# Patient Record
Sex: Female | Born: 1945 | Race: White | Hispanic: No | Smoking: Former smoker
Health system: Southern US, Community
[De-identification: ages and names within clinical notes are randomized; demographics above are authoritative.]

## PROBLEM LIST (undated history)

## (undated) DIAGNOSIS — J449 Chronic obstructive pulmonary disease, unspecified: Secondary | ICD-10-CM

## (undated) DIAGNOSIS — M542 Cervicalgia: Secondary | ICD-10-CM

## (undated) DIAGNOSIS — I10 Essential (primary) hypertension: Secondary | ICD-10-CM

## (undated) DIAGNOSIS — A048 Other specified bacterial intestinal infections: Secondary | ICD-10-CM

## (undated) DIAGNOSIS — D649 Anemia, unspecified: Secondary | ICD-10-CM

## (undated) DIAGNOSIS — Z87440 Personal history of urinary (tract) infections: Secondary | ICD-10-CM

## (undated) DIAGNOSIS — M479 Spondylosis, unspecified: Secondary | ICD-10-CM

## (undated) DIAGNOSIS — F419 Anxiety disorder, unspecified: Secondary | ICD-10-CM

## (undated) DIAGNOSIS — Z720 Tobacco use: Secondary | ICD-10-CM

## (undated) DIAGNOSIS — E871 Hypo-osmolality and hyponatremia: Secondary | ICD-10-CM

## (undated) DIAGNOSIS — R0602 Shortness of breath: Secondary | ICD-10-CM

## (undated) DIAGNOSIS — E7800 Pure hypercholesterolemia, unspecified: Secondary | ICD-10-CM

## (undated) DIAGNOSIS — F329 Major depressive disorder, single episode, unspecified: Secondary | ICD-10-CM

## (undated) DIAGNOSIS — M5136 Other intervertebral disc degeneration, lumbar region: Secondary | ICD-10-CM

## (undated) DIAGNOSIS — G629 Polyneuropathy, unspecified: Secondary | ICD-10-CM

## (undated) DIAGNOSIS — E079 Disorder of thyroid, unspecified: Secondary | ICD-10-CM

## (undated) DIAGNOSIS — C801 Malignant (primary) neoplasm, unspecified: Secondary | ICD-10-CM

## (undated) DIAGNOSIS — M51369 Other intervertebral disc degeneration, lumbar region without mention of lumbar back pain or lower extremity pain: Secondary | ICD-10-CM

## (undated) DIAGNOSIS — E119 Type 2 diabetes mellitus without complications: Secondary | ICD-10-CM

## (undated) DIAGNOSIS — J9601 Acute respiratory failure with hypoxia: Secondary | ICD-10-CM

## (undated) DIAGNOSIS — K5790 Diverticulosis of intestine, part unspecified, without perforation or abscess without bleeding: Secondary | ICD-10-CM

## (undated) DIAGNOSIS — K219 Gastro-esophageal reflux disease without esophagitis: Secondary | ICD-10-CM

## (undated) HISTORY — DX: Other intervertebral disc degeneration, lumbar region without mention of lumbar back pain or lower extremity pain: M51.369

## (undated) HISTORY — DX: Other specified bacterial intestinal infections: A04.8

## (undated) HISTORY — DX: Anxiety disorder, unspecified: F41.9

## (undated) HISTORY — PX: CATARACT EXTRACTION W/ INTRAOCULAR LENS  IMPLANT, BILATERAL: SHX1307

## (undated) HISTORY — PX: UVULECTOMY: SHX2631

## (undated) HISTORY — DX: Other intervertebral disc degeneration, lumbar region: M51.36

## (undated) HISTORY — DX: Pure hypercholesterolemia, unspecified: E78.00

## (undated) HISTORY — DX: Essential (primary) hypertension: I10

## (undated) HISTORY — DX: Chronic obstructive pulmonary disease, unspecified: J44.9

## (undated) HISTORY — PX: PARTIAL HYSTERECTOMY: SHX80

## (undated) HISTORY — PX: ABDOMINAL EXPLORATION SURGERY: SHX538

## (undated) HISTORY — DX: Diverticulosis of intestine, part unspecified, without perforation or abscess without bleeding: K57.90

## (undated) HISTORY — DX: Polyneuropathy, unspecified: G62.9

---

## 2003-10-06 ENCOUNTER — Emergency Department (HOSPITAL_COMMUNITY): Admission: EM | Admit: 2003-10-06 | Discharge: 2003-10-06 | Payer: Self-pay | Admitting: Emergency Medicine

## 2008-12-01 ENCOUNTER — Ambulatory Visit (HOSPITAL_COMMUNITY): Admission: RE | Admit: 2008-12-01 | Discharge: 2008-12-01 | Payer: Self-pay | Admitting: Family Medicine

## 2009-04-23 ENCOUNTER — Emergency Department (HOSPITAL_COMMUNITY): Admission: EM | Admit: 2009-04-23 | Discharge: 2009-04-23 | Payer: Self-pay | Admitting: Emergency Medicine

## 2009-10-16 HISTORY — PX: COLONOSCOPY: SHX174

## 2009-10-27 ENCOUNTER — Ambulatory Visit (HOSPITAL_COMMUNITY): Admission: RE | Admit: 2009-10-27 | Discharge: 2009-10-27 | Payer: Self-pay | Admitting: General Surgery

## 2010-03-11 ENCOUNTER — Ambulatory Visit: Payer: Self-pay | Admitting: Otolaryngology

## 2011-03-20 ENCOUNTER — Emergency Department (HOSPITAL_COMMUNITY)
Admission: EM | Admit: 2011-03-20 | Discharge: 2011-03-20 | Disposition: A | Discharge status: Home / Self Care | Payer: Medicare Other | Attending: Emergency Medicine | Admitting: Emergency Medicine

## 2011-03-20 ENCOUNTER — Emergency Department (HOSPITAL_COMMUNITY): Payer: Medicare Other

## 2011-03-20 DIAGNOSIS — J4489 Other specified chronic obstructive pulmonary disease: Secondary | ICD-10-CM | POA: Insufficient documentation

## 2011-03-20 DIAGNOSIS — G8929 Other chronic pain: Secondary | ICD-10-CM | POA: Insufficient documentation

## 2011-03-20 DIAGNOSIS — R079 Chest pain, unspecified: Secondary | ICD-10-CM | POA: Insufficient documentation

## 2011-03-20 DIAGNOSIS — Z79899 Other long term (current) drug therapy: Secondary | ICD-10-CM | POA: Insufficient documentation

## 2011-03-20 DIAGNOSIS — J449 Chronic obstructive pulmonary disease, unspecified: Secondary | ICD-10-CM | POA: Insufficient documentation

## 2011-03-20 DIAGNOSIS — M549 Dorsalgia, unspecified: Secondary | ICD-10-CM | POA: Insufficient documentation

## 2011-03-20 DIAGNOSIS — K2990 Gastroduodenitis, unspecified, without bleeding: Secondary | ICD-10-CM | POA: Insufficient documentation

## 2011-03-20 DIAGNOSIS — K2970 Gastritis, unspecified, without bleeding: Secondary | ICD-10-CM | POA: Insufficient documentation

## 2011-03-20 DIAGNOSIS — F411 Generalized anxiety disorder: Secondary | ICD-10-CM | POA: Insufficient documentation

## 2011-03-20 DIAGNOSIS — I10 Essential (primary) hypertension: Secondary | ICD-10-CM | POA: Insufficient documentation

## 2011-03-20 LAB — URINALYSIS, ROUTINE W REFLEX MICROSCOPIC
Bilirubin Urine: NEGATIVE
Glucose, UA: NEGATIVE mg/dL
Protein, ur: NEGATIVE mg/dL
Urobilinogen, UA: 0.2 mg/dL (ref 0.0–1.0)

## 2011-03-20 LAB — COMPREHENSIVE METABOLIC PANEL
BUN: 9 mg/dL (ref 6–23)
CO2: 26 mEq/L (ref 19–32)
Calcium: 11.2 mg/dL — ABNORMAL HIGH (ref 8.4–10.5)
Creatinine, Ser: 0.81 mg/dL (ref 0.50–1.10)
GFR calc Af Amer: >60 mL/min (ref >60)
GFR calc non Af Amer: >60 mL/min (ref >60)
Glucose, Bld: 98 mg/dL (ref 70–99)
Sodium: 133 mEq/L — ABNORMAL LOW (ref 135–145)
Total Protein: 7.4 g/dL (ref 6.0–8.3)

## 2011-03-20 LAB — CK TOTAL AND CKMB (NOT AT ARMC)
CK, MB: 2.1 ng/mL (ref 0.3–4.0)
Relative Index: INVALID (ref 0.0–2.5)
Total CK: 43 U/L (ref 7–177)

## 2011-03-20 LAB — DIFFERENTIAL
Basophils Relative: 1 % (ref 0–1)
Lymphs Abs: 2.9 10*3/uL (ref 0.7–4.0)
Monocytes Absolute: 0.8 10*3/uL (ref 0.1–1.0)
Monocytes Relative: 7 % (ref 3–12)
Neutro Abs: 7.5 10*3/uL (ref 1.7–7.7)

## 2011-03-20 LAB — LIPASE, BLOOD: Lipase: 30 U/L (ref 11–59)

## 2011-03-20 LAB — CBC
HCT: 35.9 % — ABNORMAL LOW (ref 36.0–46.0)
Hemoglobin: 12.2 g/dL (ref 12.0–15.0)
MCH: 29.8 pg (ref 26.0–34.0)
MCHC: 34 g/dL (ref 30.0–36.0)
MCV: 87.6 fL (ref 78.0–100.0)
RBC: 4.1 MIL/uL (ref 3.87–5.11)

## 2011-03-20 LAB — URINE MICROSCOPIC-ADD ON

## 2012-01-03 ENCOUNTER — Other Ambulatory Visit (HOSPITAL_COMMUNITY): Payer: Self-pay | Admitting: Family Medicine

## 2012-01-03 DIAGNOSIS — R0989 Other specified symptoms and signs involving the circulatory and respiratory systems: Secondary | ICD-10-CM

## 2012-01-03 DIAGNOSIS — Z139 Encounter for screening, unspecified: Secondary | ICD-10-CM

## 2012-01-03 DIAGNOSIS — J449 Chronic obstructive pulmonary disease, unspecified: Secondary | ICD-10-CM

## 2012-01-03 DIAGNOSIS — I10 Essential (primary) hypertension: Secondary | ICD-10-CM

## 2012-01-03 DIAGNOSIS — F17200 Nicotine dependence, unspecified, uncomplicated: Secondary | ICD-10-CM

## 2012-01-09 ENCOUNTER — Other Ambulatory Visit (HOSPITAL_COMMUNITY): Payer: Medicare Other

## 2012-01-09 ENCOUNTER — Ambulatory Visit (HOSPITAL_COMMUNITY)
Admission: RE | Admit: 2012-01-09 | Discharge: 2012-01-09 | Disposition: A | Discharge status: Home / Self Care | Payer: Medicare Other | Source: Ambulatory Visit | Attending: Family Medicine | Admitting: Family Medicine

## 2012-01-09 DIAGNOSIS — E785 Hyperlipidemia, unspecified: Secondary | ICD-10-CM | POA: Insufficient documentation

## 2012-01-09 DIAGNOSIS — I10 Essential (primary) hypertension: Secondary | ICD-10-CM | POA: Insufficient documentation

## 2012-01-09 DIAGNOSIS — J449 Chronic obstructive pulmonary disease, unspecified: Secondary | ICD-10-CM

## 2012-01-09 DIAGNOSIS — Z139 Encounter for screening, unspecified: Secondary | ICD-10-CM

## 2012-01-09 DIAGNOSIS — I739 Peripheral vascular disease, unspecified: Secondary | ICD-10-CM | POA: Insufficient documentation

## 2012-01-09 DIAGNOSIS — Z1231 Encounter for screening mammogram for malignant neoplasm of breast: Secondary | ICD-10-CM | POA: Insufficient documentation

## 2012-01-09 DIAGNOSIS — F17200 Nicotine dependence, unspecified, uncomplicated: Secondary | ICD-10-CM

## 2012-01-09 DIAGNOSIS — R0989 Other specified symptoms and signs involving the circulatory and respiratory systems: Secondary | ICD-10-CM

## 2012-04-04 ENCOUNTER — Other Ambulatory Visit (HOSPITAL_COMMUNITY): Payer: Self-pay | Admitting: Family Medicine

## 2012-04-04 DIAGNOSIS — I739 Peripheral vascular disease, unspecified: Secondary | ICD-10-CM

## 2012-04-04 DIAGNOSIS — M5430 Sciatica, unspecified side: Secondary | ICD-10-CM

## 2012-04-10 ENCOUNTER — Ambulatory Visit (HOSPITAL_COMMUNITY)
Admission: RE | Admit: 2012-04-10 | Discharge: 2012-04-10 | Disposition: A | Discharge status: Home / Self Care | Payer: Medicare Other | Source: Ambulatory Visit | Attending: Family Medicine | Admitting: Family Medicine

## 2012-04-10 DIAGNOSIS — M79609 Pain in unspecified limb: Secondary | ICD-10-CM | POA: Insufficient documentation

## 2012-04-10 DIAGNOSIS — M5430 Sciatica, unspecified side: Secondary | ICD-10-CM | POA: Insufficient documentation

## 2012-04-10 DIAGNOSIS — M5126 Other intervertebral disc displacement, lumbar region: Secondary | ICD-10-CM | POA: Insufficient documentation

## 2012-04-10 DIAGNOSIS — I739 Peripheral vascular disease, unspecified: Secondary | ICD-10-CM

## 2012-04-10 DIAGNOSIS — M5124 Other intervertebral disc displacement, thoracic region: Secondary | ICD-10-CM | POA: Insufficient documentation

## 2012-04-10 LAB — POCT I-STAT, CHEM 8
BUN: 4 mg/dL — ABNORMAL LOW (ref 6–23)
Calcium, Ion: 1.21 mmol/L (ref 1.13–1.30)
Chloride: 107 mEq/L (ref 96–112)
Creatinine, Ser: 0.9 mg/dL (ref 0.50–1.10)
Sodium: 137 mEq/L (ref 135–145)
TCO2: 21 mmol/L (ref 0–100)

## 2012-04-10 MED ORDER — IOHEXOL 350 MG/ML SOLN
150.0000 mL | Freq: Once | INTRAVENOUS | Status: AC | PRN
  Administered 2012-04-10: 150 mL via INTRAVENOUS

## 2012-04-10 MED ORDER — IOHEXOL 350 MG/ML SOLN: 150.0000 mL | Freq: Once | INTRAVENOUS | Status: AC | PRN

## 2012-04-10 NOTE — Progress Notes (Signed)
Blood sample obtained from left arm IV for Creatnine level.  

## 2012-04-26 ENCOUNTER — Emergency Department (HOSPITAL_COMMUNITY)
Admission: EM | Admit: 2012-04-26 | Discharge: 2012-04-26 | Disposition: A | Discharge status: Home / Self Care | Payer: Medicare Other | Attending: Emergency Medicine | Admitting: Emergency Medicine

## 2012-04-26 DIAGNOSIS — F419 Anxiety disorder, unspecified: Secondary | ICD-10-CM

## 2012-04-26 DIAGNOSIS — R110 Nausea: Secondary | ICD-10-CM | POA: Insufficient documentation

## 2012-04-26 DIAGNOSIS — Z789 Other specified health status: Secondary | ICD-10-CM

## 2012-04-26 DIAGNOSIS — F411 Generalized anxiety disorder: Secondary | ICD-10-CM | POA: Insufficient documentation

## 2012-04-26 MED ORDER — LORAZEPAM 1 MG PO TABS: 1.0000 mg | ORAL_TABLET | Freq: Three times a day (TID) | ORAL | Status: AC | PRN

## 2012-04-26 MED ORDER — LORAZEPAM 2 MG/ML IJ SOLN
1.0000 mg | Freq: Once | INTRAMUSCULAR | Status: AC
  Administered 2012-04-26: 1 mg via INTRAVENOUS
  Filled 2012-04-26: qty 1

## 2012-04-26 MED ORDER — ONDANSETRON 8 MG PO TBDP: 8.0000 mg | ORAL_TABLET | Freq: Three times a day (TID) | ORAL | Status: AC | PRN

## 2012-04-26 MED ORDER — ONDANSETRON HCL 4 MG/2ML IJ SOLN
4.0000 mg | Freq: Once | INTRAMUSCULAR | Status: AC
  Administered 2012-04-26: 4 mg via INTRAVENOUS
  Filled 2012-04-26: qty 2

## 2012-04-26 NOTE — ED Notes (Signed)
Pt states she thinks she is having a reaction to her medicine, has mild rash to arms, states also that she stopped taking her blood pressure med because she felt her blood pressure was normal now.

## 2012-04-26 NOTE — ED Provider Notes (Signed)
History  This chart was scribed for Toy Baker, MD by Ladona Ridgel Day. This patient was seen in room APA04/APA04 and the patient's care was started at 2030.   CSN: [id number]  Arrival date & time 04/26/12  2030   First MD Initiated Contact with Patient 04/26/12 2114      Chief Complaint  Patient presents with  . Dizziness  . Medication Reaction    The history is provided by the patient. No language interpreter was used.   Lindsey Combs is a 66 y.o. female brought in by ambulance, who presents to the Emergency Department complaining of possible reaction to her medicine this PM. She states she called EMS because she was having a skin rash and her head/eyes hurt. She states the rash has subsided since her arrival and she was being treated for diverticulitis. She had one episode of emesis last PM. She was seen by Dr. Metta Clines yesterday where she was given her prescription. She denies any itchiness, trouble swallowing, SOB, and fever. She is an everyday smoker . Pt notes that she is out of her ativan and has increased anxiety  No past medical history on file.  No past surgical history on file.  No family history on file.  History  Substance Use Topics  . Smoking status: Not on file  . Smokeless tobacco: Not on file  . Alcohol Use: Not on file    OB History    No data available      Review of Systems A complete 10 system review of systems was obtained and all systems are negative except as noted in the HPI and PMH.   Allergies  Codeine  Home Medications  No current outpatient prescriptions on file.  Triage Vitals: BP 180/73  Pulse 104  Temp 98.4 F (36.9 C) (Oral)  Resp 18  Ht 5' 5.5" (1.664 m)  Wt 130 lb (58.968 kg)  BMI 21.30 kg/m2  SpO2 98%  Physical Exam  Nursing note and vitals reviewed. Constitutional: She is oriented to person, place, and time. She appears well-developed and well-nourished. No distress.  HENT:  Head: Normocephalic and atraumatic.  Eyes:  EOM are normal.  Neck: Neck supple. No tracheal deviation present.  Cardiovascular: Normal rate, regular rhythm and normal heart sounds.   Pulmonary/Chest: Effort normal. No respiratory distress.  Musculoskeletal: Normal range of motion.  Neurological: She is alert and oriented to person, place, and time.  Skin: Skin is warm and dry. No petechiae and no rash noted. Rash is not urticarial.  Psychiatric:       Anxious.     ED Course  Procedures (including critical care time) DIAGNOSTIC STUDIES: Oxygen Saturation is 98% on room air, normal by my interpretation.    COORDINATION OF CARE: At 925 PM Discussed treatment plan with patient which includes nausea/anxiety medicine. Patient agrees.   Labs Reviewed - No data to display No results found.   No diagnosis found.    MDM  I personally performed the services described in this documentation, which was scribed in my presence. The recorded information has been reviewed and considered.  11:03 PM Pt given meds for anxiety and nausea and she feels better, no signs of allergic rxn, suspect medication intolerance from flagyl and cipro--pt will f/u her pcp         Toy Baker, MD 04/26/12 2304

## 2013-02-15 ENCOUNTER — Encounter: Payer: Self-pay | Admitting: General Surgery

## 2013-02-19 ENCOUNTER — Ambulatory Visit (INDEPENDENT_AMBULATORY_CARE_PROVIDER_SITE_OTHER): Payer: Medicare Other | Admitting: Gastroenterology

## 2013-02-19 ENCOUNTER — Encounter: Payer: Self-pay | Admitting: Gastroenterology

## 2013-02-19 VITALS — BP 119/71 | HR 96 | Temp 97.8°F | Ht 65.0 in | Wt 126.0 lb

## 2013-02-19 DIAGNOSIS — R1013 Epigastric pain: Secondary | ICD-10-CM

## 2013-02-19 DIAGNOSIS — R634 Abnormal weight loss: Secondary | ICD-10-CM | POA: Insufficient documentation

## 2013-02-19 DIAGNOSIS — K219 Gastro-esophageal reflux disease without esophagitis: Secondary | ICD-10-CM

## 2013-02-19 DIAGNOSIS — D509 Iron deficiency anemia, unspecified: Secondary | ICD-10-CM | POA: Insufficient documentation

## 2013-02-19 MED ORDER — PEG 3350-KCL-NA BICARB-NACL 420 G PO SOLR: 4000.0000 mL | ORAL | Status: DC

## 2013-02-19 NOTE — Assessment & Plan Note (Signed)
67 year old lady with recent new diagnosis of normocytic anemia with low iron saturation, low ferritin. No overt GI bleeding. Denies aspirin, NSAIDs. Chronic omeprazole. She has some vague epigastric discomfort, anorexia, 10 pound weight loss. She is a chronic smoker. History of multiple hyperplastic polyps in 2011 by Dr. Lovell Sheehan. Vague, mild left lower corner tenderness on exam but patient denies pain otherwise. Patient continues to have progressive fatigue. We'll recheck her CBC. Plan on EGD (+/-Small bowel bx) and colonoscopy for diagnostic reasons. I have discussed the risks, alternatives, benefits with regards to but not limited to the risk of reaction to medication, bleeding, infection, perforation and the patient is agreeable to proceed. Written consent to be obtained.

## 2013-02-19 NOTE — Progress Notes (Signed)
Primary Care Physician:  KNOWLTON,STEPHEN D, MD  Primary Gastroenterologist:  Michael Rourk, MD   Chief Complaint  Patient presents with  . Colonoscopy  . EGD  . Anemia    HPI:  Lindsey Combs is a 67 y.o. female here at request of Dr. Knowlton for further evaluation of IDA. Recently complained of progressive fatigue. Has chronic dyspnea on exertion related to COPD but slightly worse lately. Found to have new anemia with low ferritin of 12 as outlined below. Last colonoscopy by Dr. Jenkins in 2011, she had multiple hyperplastic polyps removed at the time. Upper abdominal discomfort chronic. Cannot wear tight clothes. Worse lately. No heartburn/indigestion. Dr.Teo ran light for sorethroat but everything "ok". On omeprazole for "awhile". No dysphagia, odynophagia. BM usually regular if eats Raisin Bran. No melena, brbpr. No N/V. Abnormal weight loss of ten pounds over the last one month. C/O anorexia. "Ulcer" at age 67 based on symptoms. No ASA, NSAIDS.   Current Outpatient Prescriptions  Medication Sig Dispense Refill  . acetaminophen (TYLENOL) 500 MG tablet Take 1,000 mg by mouth as needed.      . albuterol (PROAIR HFA) 108 (90 BASE) MCG/ACT inhaler Inhale 2 puffs into the lungs every 6 (six) hours as needed.      . atorvastatin (LIPITOR) 40 MG tablet Take 40 mg by mouth daily.       . cetirizine (ZYRTEC) 10 MG tablet Take 10 mg by mouth daily.      . clonazePAM (KLONOPIN) 1 MG tablet Take 1 mg by mouth at bedtime as needed.       . fluticasone (FLONASE) 50 MCG/ACT nasal spray Place 1 spray into the nose daily.       . omeprazole (PRILOSEC) 40 MG capsule Take 40 mg by mouth daily.        No current facility-administered medications for this visit.    Allergies as of 02/19/2013 - Review Complete 02/19/2013  Allergen Reaction Noted  . Codeine Itching 04/10/2012    Past Medical History  Diagnosis Date  . Anxiety   . Hypertension   . COPD (chronic obstructive pulmonary disease)   .  Hypercholesterolemia   . Diverticulosis   . Peripheral neuropathy   . DDD (degenerative disc disease), lumbar     Past Surgical History  Procedure Laterality Date  . Colonoscopy  10/16/09    Jenkins:3 polypsin the sigmoid colon/polyps in the descending colon and the rectum/scattered diverticulum. hyperplastic  . Partial hysterectomy      age 67  . Abdominal exploration surgery      age 67, large ovarian cyst    Family History  Problem Relation Age of Onset  . Stomach cancer Paternal Grandfather   . Leukemia Father   . Colon cancer Neg Hx     History   Social History  . Marital Status: Divorced    Spouse Name: N/A    Number of Children: 4  . Years of Education: N/A   Occupational History  . Not on file.   Social History Main Topics  . Smoking status: Current Every Day Smoker -- 1.00 packs/day    Types: Cigarettes  . Smokeless tobacco: Not on file  . Alcohol Use: No  . Drug Use: No  . Sexually Active: Not on file   Other Topics Concern  . Not on file   Social History Narrative  . No narrative on file      ROS:  General: Negative for fever, chills. C/O fatigue, weakness, abnormal weight   loss of 10 pounds in the last one month. C/O anorexia. Eyes: Negative for vision changes.  ENT: Negative for hoarseness, difficulty swallowing , nasal congestion. CV: Negative for chest pain, angina, palpitations, dyspnea on exertion, peripheral edema.  Respiratory: Negative for dyspnea at rest, cough, sputum, wheezing. +DOE. GI: See history of present illness. GU:  Negative for dysuria, hematuria, urinary incontinence, urinary frequency, nocturnal urination. Difficulty emptying bladder. MS: occasional joint pain. Chronic intermittent low back pain.  Derm: Negative for rash or itching.  Neuro: Negative for weakness, abnormal sensation, seizure, frequent headaches, memory loss, confusion.  Psych: Negative for anxiety, depression, suicidal ideation, hallucinations.  Endo: 10  pound unintentional weight loss.   Heme: Negative for bruising or bleeding. Allergy: Negative for rash or hives.    Physical Examination:  BP 119/71  Pulse 96  Temp(Src) 97.8 F (36.6 C) (Oral)  Ht 5' 5" (1.651 m)  Wt 126 lb (57.153 kg)  BMI 20.97 kg/m2   General: Well-nourished, well-developed in no acute distress.  Head: Normocephalic, atraumatic.   Eyes: Conjunctiva slightly pale, no icterus. Mouth: Oropharyngeal mucosa moist and pink , no lesions erythema or exudate. Neck: Supple without thyromegaly, masses, or lymphadenopathy.  Lungs: Scattered wheezes.   Heart: Regular rate and rhythm, no murmurs rubs or gallops.  Abdomen: Bowel sounds are normal, mild epigastric and left-sided tenderness to deep palpation, nondistended, no hepatosplenomegaly or masses, no abdominal bruits or    hernia , no rebound or guarding.   Rectal: Deferred Extremities: No lower extremity edema. No clubbing or deformities.  Neuro: Alert and oriented x 4 , grossly normal neurologically.  Skin: Warm and dry, no rash or jaundice.   Psych: Alert and cooperative, normal mood and affect.  Labs: Labs from 01/14/2013. Glucose 99, BUN 10, creatinine 0.74, total bilirubin 0.3, alkaline phosphatase 87, AST 12, ALT 9, albumin 4.5, calcium 10.2, white blood cell count 10,400, hemoglobin 11.1, hematocrit 34.6, MCV 90.2, platelets 305,000.  Labs from 01/21/2013. Hemoglobin 10.9, hematocrit 33.4, MCV 90.2, iron 46, and her saturations 12%, TIBC 378, vitamin B12 517, folate greater than 24. Ferritin 12. TSH 2.03  Imaging Studies: No results found.    

## 2013-02-19 NOTE — Patient Instructions (Signed)
1. Lab work as discussed. 2. Upper endoscopy and colonoscopy as scheduled. See separate instructions.

## 2013-02-19 NOTE — Progress Notes (Signed)
CC PCP 

## 2013-02-20 ENCOUNTER — Encounter (HOSPITAL_COMMUNITY): Payer: Self-pay | Admitting: Pharmacy Technician

## 2013-02-20 LAB — CBC WITH DIFFERENTIAL/PLATELET
Basophils Relative: 1 % (ref 0–1)
HCT: 34.5 % — ABNORMAL LOW (ref 36.0–46.0)
Hemoglobin: 11.1 g/dL — ABNORMAL LOW (ref 12.0–15.0)
Lymphs Abs: 3.2 10*3/uL (ref 0.7–4.0)
MCH: 28.2 pg (ref 26.0–34.0)
MCHC: 32.2 g/dL (ref 30.0–36.0)
Monocytes Absolute: 0.5 10*3/uL (ref 0.1–1.0)
Monocytes Relative: 4 % (ref 3–12)
Neutro Abs: 6.9 10*3/uL (ref 1.7–7.7)
RBC: 3.94 MIL/uL (ref 3.87–5.11)

## 2013-02-21 LAB — CBC
%SAT: 12
Ferritin: 12
Folate: 24
HCT: 35 %
HGB: 11.1 g/dL
MCV: 90.2 fL
MCV: 90.2 fL
Platelets: 305
TSH: 2.03 u[IU]/mL (ref 0.41–5.90)
Vitamin B12 Bind Capacity: 517
WBC: 10.4

## 2013-02-21 LAB — COMPREHENSIVE METABOLIC PANEL
Alkaline Phosphatase: 87 U/L
BUN: 10 mg/dL (ref 4–21)
Calcium: 10.2 mg/dL
Potassium: 4.7 mmol/L
Sodium: 143 mmol/L (ref 137–147)

## 2013-02-21 NOTE — Progress Notes (Signed)
Quick Note:  Her H/H is stable. Mild elevation in WBC, likely insignificant. EGD/TCS as planned. How is she doing? Any fever or abdominal pain, etc.? ______

## 2013-02-22 NOTE — Progress Notes (Signed)
She is doing a little bit better. No fever or abd pain. She is weak and glad her numbers are going up.

## 2013-02-22 NOTE — Progress Notes (Signed)
Pt aware of results 

## 2013-02-22 NOTE — Progress Notes (Signed)
Quick Note:  Please touch base with patient this morning about labs and she if she is having fever, abd pain, dysuria, how is she doing. ______

## 2013-03-01 ENCOUNTER — Ambulatory Visit (HOSPITAL_COMMUNITY)
Admission: RE | Admit: 2013-03-01 | Discharge: 2013-03-01 | Disposition: A | Discharge status: Home / Self Care | Payer: Medicare Other | Source: Ambulatory Visit | Attending: Internal Medicine | Admitting: Internal Medicine

## 2013-03-01 ENCOUNTER — Encounter (HOSPITAL_COMMUNITY): Payer: Self-pay | Admitting: *Deleted

## 2013-03-01 ENCOUNTER — Encounter (HOSPITAL_COMMUNITY)
Admission: RE | Disposition: A | Discharge status: Home / Self Care | Payer: Self-pay | Source: Ambulatory Visit | Attending: Internal Medicine

## 2013-03-01 DIAGNOSIS — D509 Iron deficiency anemia, unspecified: Secondary | ICD-10-CM

## 2013-03-01 DIAGNOSIS — D126 Benign neoplasm of colon, unspecified: Secondary | ICD-10-CM | POA: Insufficient documentation

## 2013-03-01 DIAGNOSIS — J449 Chronic obstructive pulmonary disease, unspecified: Secondary | ICD-10-CM | POA: Insufficient documentation

## 2013-03-01 DIAGNOSIS — K620 Anal polyp: Secondary | ICD-10-CM | POA: Insufficient documentation

## 2013-03-01 DIAGNOSIS — Z79899 Other long term (current) drug therapy: Secondary | ICD-10-CM | POA: Insufficient documentation

## 2013-03-01 DIAGNOSIS — D129 Benign neoplasm of anus and anal canal: Secondary | ICD-10-CM

## 2013-03-01 DIAGNOSIS — K2990 Gastroduodenitis, unspecified, without bleeding: Secondary | ICD-10-CM | POA: Insufficient documentation

## 2013-03-01 DIAGNOSIS — Z860100 Personal history of colon polyps, unspecified: Secondary | ICD-10-CM | POA: Insufficient documentation

## 2013-03-01 DIAGNOSIS — K2970 Gastritis, unspecified, without bleeding: Secondary | ICD-10-CM | POA: Insufficient documentation

## 2013-03-01 DIAGNOSIS — I10 Essential (primary) hypertension: Secondary | ICD-10-CM | POA: Insufficient documentation

## 2013-03-01 DIAGNOSIS — K5730 Diverticulosis of large intestine without perforation or abscess without bleeding: Secondary | ICD-10-CM

## 2013-03-01 DIAGNOSIS — F411 Generalized anxiety disorder: Secondary | ICD-10-CM | POA: Insufficient documentation

## 2013-03-01 DIAGNOSIS — G609 Hereditary and idiopathic neuropathy, unspecified: Secondary | ICD-10-CM | POA: Insufficient documentation

## 2013-03-01 DIAGNOSIS — M51379 Other intervertebral disc degeneration, lumbosacral region without mention of lumbar back pain or lower extremity pain: Secondary | ICD-10-CM | POA: Insufficient documentation

## 2013-03-01 DIAGNOSIS — R634 Abnormal weight loss: Secondary | ICD-10-CM

## 2013-03-01 DIAGNOSIS — R1013 Epigastric pain: Secondary | ICD-10-CM

## 2013-03-01 DIAGNOSIS — A048 Other specified bacterial intestinal infections: Secondary | ICD-10-CM | POA: Insufficient documentation

## 2013-03-01 DIAGNOSIS — J4489 Other specified chronic obstructive pulmonary disease: Secondary | ICD-10-CM | POA: Insufficient documentation

## 2013-03-01 DIAGNOSIS — Z8711 Personal history of peptic ulcer disease: Secondary | ICD-10-CM | POA: Insufficient documentation

## 2013-03-01 DIAGNOSIS — M5137 Other intervertebral disc degeneration, lumbosacral region: Secondary | ICD-10-CM | POA: Insufficient documentation

## 2013-03-01 DIAGNOSIS — E7800 Pure hypercholesterolemia, unspecified: Secondary | ICD-10-CM | POA: Insufficient documentation

## 2013-03-01 DIAGNOSIS — D128 Benign neoplasm of rectum: Secondary | ICD-10-CM

## 2013-03-01 DIAGNOSIS — K449 Diaphragmatic hernia without obstruction or gangrene: Secondary | ICD-10-CM

## 2013-03-01 DIAGNOSIS — K219 Gastro-esophageal reflux disease without esophagitis: Secondary | ICD-10-CM

## 2013-03-01 HISTORY — DX: Shortness of breath: R06.02

## 2013-03-01 HISTORY — DX: Spondylosis, unspecified: M47.9

## 2013-03-01 HISTORY — DX: Gastro-esophageal reflux disease without esophagitis: K21.9

## 2013-03-01 HISTORY — PX: BIOPSY: SHX5522

## 2013-03-01 HISTORY — PX: COLONOSCOPY WITH ESOPHAGOGASTRODUODENOSCOPY (EGD): SHX5779

## 2013-03-01 SURGERY — COLONOSCOPY WITH ESOPHAGOGASTRODUODENOSCOPY (EGD)
Anesthesia: Moderate Sedation

## 2013-03-01 MED ORDER — ONDANSETRON HCL 4 MG/2ML IJ SOLN
INTRAMUSCULAR | Status: AC
  Filled 2013-03-01: qty 2

## 2013-03-01 MED ORDER — MEPERIDINE HCL 100 MG/ML IJ SOLN
INTRAMUSCULAR | Status: DC | PRN
  Administered 2013-03-01 (×2): 25 mg via INTRAVENOUS
  Administered 2013-03-01: 50 mg via INTRAVENOUS

## 2013-03-01 MED ORDER — BUTAMBEN-TETRACAINE-BENZOCAINE 2-2-14 % EX AERO
INHALATION_SPRAY | CUTANEOUS | Status: DC | PRN
  Administered 2013-03-01: 2 via TOPICAL

## 2013-03-01 MED ORDER — MEPERIDINE HCL 100 MG/ML IJ SOLN
INTRAMUSCULAR | Status: AC
  Filled 2013-03-01: qty 2

## 2013-03-01 MED ORDER — MIDAZOLAM HCL 5 MG/5ML IJ SOLN
INTRAMUSCULAR | Status: DC | PRN
  Administered 2013-03-01: 2 mg via INTRAVENOUS
  Administered 2013-03-01: 1 mg via INTRAVENOUS
  Administered 2013-03-01: 2 mg via INTRAVENOUS
  Administered 2013-03-01: 1 mg via INTRAVENOUS

## 2013-03-01 MED ORDER — STERILE WATER FOR IRRIGATION IR SOLN
Status: DC | PRN
  Administered 2013-03-01: 09:00:00

## 2013-03-01 MED ORDER — SODIUM CHLORIDE 0.9 % IV SOLN
INTRAVENOUS | Status: DC
  Administered 2013-03-01: 08:00:00 via INTRAVENOUS

## 2013-03-01 MED ORDER — MIDAZOLAM HCL 5 MG/5ML IJ SOLN
INTRAMUSCULAR | Status: AC
  Filled 2013-03-01: qty 10

## 2013-03-01 MED ORDER — ONDANSETRON HCL 4 MG/2ML IJ SOLN
INTRAMUSCULAR | Status: DC | PRN
  Administered 2013-03-01: 4 mg via INTRAVENOUS

## 2013-03-01 NOTE — Op Note (Signed)
[facility] [address] [place], [address]   ENDOSCOPY PROCEDURE REPORT  PATIENT: Lindsey Combs, Lindsey Combs  MR#: [id number] BIRTHDATE: [birth year] , 66  yrs. old GENDER: Female ENDOSCOPIST: R.  Roetta Sessions, MD FACP FACG REFERRED BY:  Gareth Morgan, M.D. PROCEDURE DATE:  03/01/2013 PROCEDURE:     EGD with gastric and duodenal biopsy  INDICATIONS:     weight loss and iron deficiency anemia  INFORMED CONSENT:   The risks, benefits, limitations, alternatives and imponderables have been discussed.  The potential for biopsy, esophogeal dilation, etc. have also been reviewed.  Questions have been answered.  All parties agreeable.  Please see the history and physical in the medical record for more information.  MEDICATIONS:  Versed 5 mg IV and Demerol 75 mg IV. Zofran 4 mg IV. Cetacaine spray.  DESCRIPTION OF PROCEDURE:   The EG-[id number] ([id number])  endoscope was introduced through the mouth and advanced to the second portion of the duodenum without difficulty or limitations.  The mucosal surfaces were surveyed very carefully during advancement of the scope and upon withdrawal.  Retroflexion view of the proximal stomach and esophagogastric junction was performed.      FINDINGS:  Normal esophagus. Stomach empty. Normal. Gastric mucosa. Tiny hiatal hernia. Patent pylorus. Normal-appearing first, second and third portion of the duodenum.  THERAPEUTIC / DIAGNOSTIC MANEUVERS PERFORMED:  Biopsies the gastric and duodenal mucosa taken for histologic study   COMPLICATIONS:  None  IMPRESSION:  Tiny hiatal hernia; otherwise, normal examination. Status post biopsies as described above  RECOMMENDATIONS:  Followup on pathology. See colonoscopy report.    _______________________________ R. Roetta Sessions, MD FACP [facility] eSigned:  R. Roetta Sessions, MD FACP [facility] 03/01/2013 9:10 AM     CC:

## 2013-03-01 NOTE — Interval H&P Note (Signed)
History and Physical Interval Note:  03/01/2013 8:50 AM  Neal Dy  has presented today for surgery, with the diagnosis of IDA, ABNORMAL WEIGHT LOSS, EPIGASTRIC PAIN, CHRONIC GERD  The various methods of treatment have been discussed with the patient and family. After consideration of risks, benefits and other options for treatment, the patient has consented to  Procedure(s) with comments: COLONOSCOPY WITH ESOPHAGOGASTRODUODENOSCOPY (EGD) (N/A) - 8:30 BIOPSY (N/A) - POSSIBLE SMALL BOWEL BIOPSY as a surgical intervention .  The patient's history has been reviewed, patient examined, no change in status, stable for surgery.  I have reviewed the patient's chart and labs.  Questions were answered to the patient's satisfaction.     Molly Maduro Tashi Band  Colonoscopy and EGD today per plan. Patient states Hemoccult negative in Dr. Michelle Nasuti office recently.The risks, benefits, limitations, imponderables and alternatives regarding both EGD and colonoscopy have been reviewed with the patient. Questions have been answered. All parties agreeable.

## 2013-03-01 NOTE — Op Note (Signed)
[facility] [address] [place], [address]   COLONOSCOPY PROCEDURE REPORT  PATIENT: [name], [name]  MR#:         [id number] BIRTHDATE: [birth year] , 66  yrs. old GENDER: Female ENDOSCOPIST: R.  Roetta Sessions, MD FACP FACG REFERRED BY:  Gareth Morgan, M.D. PROCEDURE DATE:  03/01/2013 PROCEDURE:     Ileocolonoscopy with snare polypectomy and polyp ablation  INDICATIONS: iron deficiency anemia; weight loss.  INFORMED CONSENT:  The risks, benefits, alternatives and imponderables including but not limited to bleeding, perforation as well as the possibility of a missed lesion have been reviewed.  The potential for biopsy, lesion removal, etc. have also been discussed.  Questions have been answered.  All parties agreeable. Please see the history and physical in the medical record for more information.  MEDICATIONS: Versed 6 mg Demerol 100 mg IV doses. Zofran 4 mg IV  DESCRIPTION OF PROCEDURE:  After a digital rectal exam was performed, the EG-2990i ([id number]) and EC-3890Li ([id number]) colonoscope was advanced from the anus through the rectum and colon to the area of the cecum, ileocecal valve and appendiceal orifice. The cecum was deeply intubated.  These structures were well-seen and photographed for the record.  From the level of the cecum and ileocecal valve, the scope was slowly and cautiously withdrawn. The mucosal surfaces were carefully surveyed utilizing scope tip deflection to facilitate fold flattening as needed.  The scope was pulled down into the rectum where a thorough examination including retroflexion was performed.    FINDINGS:  Adequate preparation. Single external hemorrhoidal tag; multiple diminutive distal rectal polyps. Left-sided diverticula (1) 5 mm polyp at the rectosigmoid junction. The patient had an angry pedunculated 9 mm polyp on the proximal side of the ileocecal valve which was difficult to approach. The remainder of the  colonic mucosa appeared normal. The distal 10 cm of terminal ileal mucosa also appeared normal.  THERAPEUTIC / DIAGNOSTIC MANEUVERS PERFORMED:  The polyp at ileocecal valve was hot snare removed. The rectosigmoid polyp was hot snare removed. Multiple diminutive rectal and rectosigmoid polyps were ablated with the tip of a hot snare loop.  COMPLICATIONS: None  CECAL WITHDRAWAL TIME:  21 minutes  IMPRESSION:  Multiple colonic polyps - treated/removed as described above. Colonic diverticulosis.  RECOMMENDATIONS: Followup on pathology. Patient may need further evaluation of weight loss and iron deficiency anemia. Further recommendations to follow. See EGD report.   _______________________________ eSigned:  R. Roetta Sessions, MD FACP [facility] 03/01/2013 9:45 AM   CC:    PATIENT NAME:  Lindsey Combs, Lindsey Combs MR#: [id number]

## 2013-03-01 NOTE — H&P (View-Only) (Signed)
Primary Care Physician:  Milana Obey, MD  Primary Gastroenterologist:  Roetta Sessions, MD   Chief Complaint  Patient presents with  . Colonoscopy  . EGD  . Anemia    HPI:  Lindsey Combs is a 67 y.o. female here at request of Dr. Sudie Bailey for further evaluation of IDA. Recently complained of progressive fatigue. Has chronic dyspnea on exertion related to COPD but slightly worse lately. Found to have new anemia with low ferritin of 12 as outlined below. Last colonoscopy by Dr. Lovell Sheehan in 2011, she had multiple hyperplastic polyps removed at the time. Upper abdominal discomfort chronic. Cannot wear tight clothes. Worse lately. No heartburn/indigestion. Dr.Teo ran light for sorethroat but everything "ok". On omeprazole for "awhile". No dysphagia, odynophagia. BM usually regular if eats Raisin Bran. No melena, brbpr. No N/V. Abnormal weight loss of ten pounds over the last one month. C/O anorexia. "Ulcer" at age 57 based on symptoms. No ASA, NSAIDS.   Current Outpatient Prescriptions  Medication Sig Dispense Refill  . acetaminophen (TYLENOL) 500 MG tablet Take 1,000 mg by mouth as needed.      [name] albuterol (PROAIR HFA) 108 (90 BASE) MCG/ACT inhaler Inhale 2 puffs into the lungs every 6 (six) hours as needed.      [name] atorvastatin (LIPITOR) 40 MG tablet Take 40 mg by mouth daily.       . cetirizine (ZYRTEC) 10 MG tablet Take 10 mg by mouth daily.      . clonazePAM (KLONOPIN) 1 MG tablet Take 1 mg by mouth at bedtime as needed.       . fluticasone (FLONASE) 50 MCG/ACT nasal spray Place 1 spray into the nose daily.       [name] omeprazole (PRILOSEC) 40 MG capsule Take 40 mg by mouth daily.        No current facility-administered medications for this visit.    Allergies as of 02/19/2013 - Review Complete 02/19/2013  Allergen Reaction Noted  . Codeine Itching 04/10/2012    Past Medical History  Diagnosis Date  . Anxiety   . Hypertension   . COPD (chronic obstructive pulmonary disease)   .  Hypercholesterolemia   . Diverticulosis   . Peripheral neuropathy   . DDD (degenerative disc disease), lumbar     Past Surgical History  Procedure Laterality Date  . Colonoscopy  10/16/09    Jenkins:3 polypsin the sigmoid colon/polyps in the descending colon and the rectum/scattered diverticulum. hyperplastic  . Partial hysterectomy      age 39  . Abdominal exploration surgery      age 61, large ovarian cyst    Family History  Problem Relation Age of Onset  . Stomach cancer Paternal Grandfather   . Leukemia Father   . Colon cancer Neg Hx     History   Social History  . Marital Status: Divorced    Spouse Name: N/A    Number of Children: 4  . Years of Education: N/A   Occupational History  . Not on file.   Social History Main Topics  . Smoking status: Current Every Day Smoker -- 1.00 packs/day    Types: Cigarettes  . Smokeless tobacco: Not on file  . Alcohol Use: No  . Drug Use: No  . Sexually Active: Not on file   Other Topics Concern  . Not on file   Social History Narrative  . No narrative on file      ROS:  General: Negative for fever, chills. C/O fatigue, weakness, abnormal weight  loss of 10 pounds in the last one month. C/O anorexia. Eyes: Negative for vision changes.  ENT: Negative for hoarseness, difficulty swallowing , nasal congestion. CV: Negative for chest pain, angina, palpitations, dyspnea on exertion, peripheral edema.  Respiratory: Negative for dyspnea at rest, cough, sputum, wheezing. +DOE. GI: See history of present illness. GU:  Negative for dysuria, hematuria, urinary incontinence, urinary frequency, nocturnal urination. Difficulty emptying bladder. MS: occasional joint pain. Chronic intermittent low back pain.  Derm: Negative for rash or itching.  Neuro: Negative for weakness, abnormal sensation, seizure, frequent headaches, memory loss, confusion.  Psych: Negative for anxiety, depression, suicidal ideation, hallucinations.  Endo: 10  pound unintentional weight loss.   Heme: Negative for bruising or bleeding. Allergy: Negative for rash or hives.    Physical Examination:  BP 119/71  Pulse 96  Temp(Src) 97.8 F (36.6 C) (Oral)  Ht 5\' 5"  (1.651 m)  Wt 126 lb (57.153 kg)  BMI 20.97 kg/m2   General: Well-nourished, well-developed in no acute distress.  Head: Normocephalic, atraumatic.   Eyes: Conjunctiva slightly pale, no icterus. Mouth: Oropharyngeal mucosa moist and pink , no lesions erythema or exudate. Neck: Supple without thyromegaly, masses, or lymphadenopathy.  Lungs: Scattered wheezes.   Heart: Regular rate and rhythm, no murmurs rubs or gallops.  Abdomen: Bowel sounds are normal, mild epigastric and left-sided tenderness to deep palpation, nondistended, no hepatosplenomegaly or masses, no abdominal bruits or    hernia , no rebound or guarding.   Rectal: Deferred Extremities: No lower extremity edema. No clubbing or deformities.  Neuro: Alert and oriented x 4 , grossly normal neurologically.  Skin: Warm and dry, no rash or jaundice.   Psych: Alert and cooperative, normal mood and affect.  Labs: Labs from 01/14/2013. Glucose 99, BUN 10, creatinine 0.74, total bilirubin 0.3, alkaline phosphatase 87, AST 12, ALT 9, albumin 4.5, calcium 10.2, white blood cell count 10,400, hemoglobin 11.1, hematocrit 34.6, MCV 90.2, platelets 305,000.  Labs from 01/21/2013. Hemoglobin 10.9, hematocrit 33.4, MCV 90.2, iron 46, and her saturations 12%, TIBC 378, vitamin B12 517, folate greater than 24. Ferritin 12. TSH 2.03  Imaging Studies: No results found.

## 2013-03-04 ENCOUNTER — Encounter (HOSPITAL_COMMUNITY): Payer: Self-pay | Admitting: Internal Medicine

## 2013-03-10 ENCOUNTER — Encounter: Payer: Self-pay | Admitting: Internal Medicine

## 2013-03-12 ENCOUNTER — Telehealth: Payer: Self-pay

## 2013-03-12 NOTE — Telephone Encounter (Signed)
Rx called into [facility]

## 2013-03-12 NOTE — Telephone Encounter (Signed)
Letter from: Corbin Ade  Reason for Letter: Results Review Send letter to patient.  Send copy of letter with path to referring provider and PCP.  Pt needs prevpak or generic equivalent x 14 days; hold lipitor and omeprazole while on treatment - then resume.   Need cbc and office follow-up w LSL in about 6 weeks

## 2013-03-12 NOTE — Telephone Encounter (Signed)
Tried to call pt- LMOM 

## 2013-03-12 NOTE — Telephone Encounter (Signed)
Pt called back and is aware of results and Rx. She understands to hold her other medications.

## 2013-03-14 ENCOUNTER — Other Ambulatory Visit: Payer: Self-pay | Admitting: Internal Medicine

## 2013-03-14 ENCOUNTER — Other Ambulatory Visit: Payer: Self-pay

## 2013-03-14 DIAGNOSIS — D509 Iron deficiency anemia, unspecified: Secondary | ICD-10-CM

## 2013-03-14 NOTE — Telephone Encounter (Signed)
Lab order on file. Please schedule ov in 6 weeks. Please cc pcp

## 2013-03-18 ENCOUNTER — Telehealth: Payer: Self-pay

## 2013-03-18 NOTE — Telephone Encounter (Signed)
Pt said she is returning someone's call.

## 2013-03-18 NOTE — Telephone Encounter (Signed)
Routing to SS. She was scheduling pt appt for follow up.

## 2013-03-18 NOTE — Telephone Encounter (Signed)
Pt is aware of OV on 7/25 at 0930 with LSL

## 2013-03-18 NOTE — Telephone Encounter (Signed)
Pt is aware of OV on 7/25 at 0930

## 2013-04-16 ENCOUNTER — Encounter: Payer: Self-pay | Admitting: Internal Medicine

## 2013-04-19 ENCOUNTER — Encounter: Payer: Self-pay | Admitting: Gastroenterology

## 2013-04-19 ENCOUNTER — Ambulatory Visit (INDEPENDENT_AMBULATORY_CARE_PROVIDER_SITE_OTHER): Payer: Medicare Other | Admitting: Gastroenterology

## 2013-04-19 VITALS — BP 134/79 | HR 89 | Temp 97.4°F | Ht 68.0 in | Wt 123.6 lb

## 2013-04-19 DIAGNOSIS — G8929 Other chronic pain: Secondary | ICD-10-CM | POA: Insufficient documentation

## 2013-04-19 DIAGNOSIS — R634 Abnormal weight loss: Secondary | ICD-10-CM

## 2013-04-19 DIAGNOSIS — M549 Dorsalgia, unspecified: Secondary | ICD-10-CM

## 2013-04-19 DIAGNOSIS — D509 Iron deficiency anemia, unspecified: Secondary | ICD-10-CM

## 2013-04-19 MED ORDER — HYDROCODONE-ACETAMINOPHEN 5-325 MG PO TABS: 1.0000 | ORAL_TABLET | Freq: Four times a day (QID) | ORAL | Status: DC | PRN

## 2013-04-19 NOTE — Progress Notes (Signed)
CC PCP 

## 2013-04-19 NOTE — Patient Instructions (Addendum)
1. Please call Dr. Justus Memory office today to discuss painful urination. 2. I have given you a one time prescription for pain medication for your back until you're able to address further with Dr. Sudie Bailey. 3. Please have your blood work done on Monday. If nothing else is found that would contribute to your weight loss, we will plan on chest x-ray and CT scan of your abdomen as the next step. 4. Try to eat several small snacks throughout the day to increase calorie intake. 5. Return to the office in 6-8 weeks to see Dr. Jena Gauss.

## 2013-04-19 NOTE — Assessment & Plan Note (Addendum)
67 year old lady with history of abnormal unexplained weight loss, iron deficiency anemia. She was treated for H. pylori back in June. No longer having abdominal pain but has had no improvement in her appetite. Continues to lose weight, down 3 more pounds. She is due for followup CBC. We'll recheck thyroid status. If normal, would plan for chest x-ray 2 views, CT abdomen pelvis with contrast to further evaluate unexplained weight loss and a chronic smoker. Patient agrees with the plan. Before pursuing chest x-ray and CT she has asked that we wait for the blood work which I feel is reasonable. I have encouraged her to increase her oral intake.  She complains of dysuria. Given that we're not equipped to check urinalysis with an office and it is a weekend I have requested that she call her PCPs office which she agrees to do. I have given her a one time prescription for Vicodin for chronic back pain. She will f/u with PCP.  OV with Dr. Jena Gauss in 6-8 weeks.

## 2013-04-19 NOTE — Progress Notes (Signed)
Primary Care Physician: Milana Obey, MD  Primary Gastroenterologist:  Roetta Sessions, MD   Chief Complaint  Patient presents with  . Follow-up    HPI: Lindsey Combs is a 67 y.o. female here for followup of recent colonoscopy and EGD. She has a history of abnormal weight loss, iron deficiency anemia, epigastric pain, GERD. Procedures done on 03/01/2013 by Dr. Jena Gauss. She had a tiny hiatal hernia. Small bowel biopsy negative for celiac disease. Multiple colonic polyps treated and removed. Biopsy showed hyperplastic polyps. She also colonic diverticulosis. Due to her history of multiple polyps recently and and in the past she is slated for a followup colonoscopy in 5 years.  She is down another 3 pounds since her last office visit.  She reports she weighed 147 lb about 4-5 months ago. Appetite very bad. Eats about one meal per day. Gets little hungry late in the afternoon. Some nausea. No vomiting. No abdominal pain. C/O chronic back pain. Back pain constant. States has DDD. BM fine. [place] 3-4. No melena, brbpr. Completed H.Pylori treatment. Dad had thyroid disease and leukemia. C/O dysuria. Plans to see PCP today.  Current Outpatient Prescriptions  Medication Sig Dispense Refill  . acetaminophen (TYLENOL) 500 MG tablet Take 1,000 mg by mouth as needed for pain.       [name] albuterol (PROAIR HFA) 108 (90 BASE) MCG/ACT inhaler Inhale 2 puffs into the lungs every 6 (six) hours as needed.      [name] atorvastatin (LIPITOR) 40 MG tablet Take 40 mg by mouth daily.       . clonazePAM (KLONOPIN) 1 MG tablet Take 0.5-1 mg by mouth 3 (three) times daily as needed for anxiety.       . fluticasone (FLONASE) 50 MCG/ACT nasal spray Place 1 spray into the nose daily.       [name] omeprazole (PRILOSEC) 40 MG capsule Take 40 mg by mouth daily.       . cetirizine (ZYRTEC) 10 MG tablet Take 10 mg by mouth daily.       No current facility-administered medications for this visit.    Allergies as of 04/19/2013 -  Review Complete 04/19/2013  Allergen Reaction Noted  . Codeine Itching 04/10/2012    ROS:  General: Negative for fever, chills, fatigue, weakness. See hpi. ENT: Negative for hoarseness, difficulty swallowing , nasal congestion. CV: Negative for chest pain, angina, palpitations, dyspnea on exertion, peripheral edema.  Respiratory: Negative for dyspnea at rest, dyspnea on exertion, cough, sputum, wheezing.  GI: See history of present illness. GU:  Negative for  hematuria, urinary incontinence, urinary frequency, nocturnal urination. +dysuria-to see PCP today. Endo: see hpi.   Physical Examination:   BP 134/79  Pulse 89  Temp(Src) 97.4 F (36.3 C) (Oral)  Ht 5\' 8"  (1.727 m)  Wt 123 lb 9.6 oz (56.065 kg)  BMI 18.8 kg/m2  General: Well-nourished, well-developed in no acute distress.  Eyes: No icterus. Mouth: Oropharyngeal mucosa moist and pink , no lesions erythema or exudate. Lungs: Clear to auscultation bilaterally.  Heart: Regular rate and rhythm, no murmurs rubs or gallops.  Abdomen: Bowel sounds are normal, nontender, nondistended, no hepatosplenomegaly or masses, no abdominal bruits or hernia , no rebound or guarding.   Extremities: No lower extremity edema. No clubbing or deformities. Neuro: Alert and oriented x 4   Skin: Warm and dry, no jaundice.   Psych: Alert and cooperative, normal mood and affect.

## 2013-04-22 ENCOUNTER — Encounter: Payer: Self-pay | Admitting: Internal Medicine

## 2013-04-22 LAB — CBC WITH DIFFERENTIAL/PLATELET
Basophils Relative: 1 % (ref 0–1)
Eosinophils Relative: 2 % (ref 0–5)
HCT: 35.6 % — ABNORMAL LOW (ref 36.0–46.0)
Hemoglobin: 11.6 g/dL — ABNORMAL LOW (ref 12.0–15.0)
MCH: 28.5 pg (ref 26.0–34.0)
MCHC: 32.6 g/dL (ref 30.0–36.0)
MCV: 87.5 fL (ref 78.0–100.0)
Monocytes Absolute: 0.5 10*3/uL (ref 0.1–1.0)
Monocytes Relative: 7 % (ref 3–12)
Neutro Abs: 4.5 10*3/uL (ref 1.7–7.7)

## 2013-04-26 NOTE — Progress Notes (Signed)
Quick Note:  TSH normal. Per plan, CXR PA and lateral, with CT abd/pelvis due to weight loss. Hx of smoker.   Please arrange for next week.   ______

## 2013-04-29 ENCOUNTER — Other Ambulatory Visit: Payer: Self-pay | Admitting: Gastroenterology

## 2013-04-29 ENCOUNTER — Other Ambulatory Visit: Payer: Self-pay | Admitting: General Practice

## 2013-04-29 ENCOUNTER — Telehealth: Payer: Self-pay | Admitting: Internal Medicine

## 2013-04-29 DIAGNOSIS — Z139 Encounter for screening, unspecified: Secondary | ICD-10-CM

## 2013-04-29 NOTE — Progress Notes (Signed)
Quick Note:  Lab order faxed to [facility]. ______ 

## 2013-04-29 NOTE — Telephone Encounter (Signed)
Spoke with pt

## 2013-04-29 NOTE — Addendum Note (Signed)
Addended by: Jennings Books on: 04/29/2013 09:27 AM   Modules accepted: Orders

## 2013-04-29 NOTE — Progress Notes (Signed)
Quick Note:  Her H/H is up some but not quite normal. Await CXR/CT A/P. ______

## 2013-04-29 NOTE — Progress Notes (Signed)
Quick Note:  Pt is scheduled on 05/03/13 at 10:15 am she will arrive at 8:00am, because she would like to drink her contrast at [facility]. Pt will have labs done today to check her creatinine. ______

## 2013-04-29 NOTE — Telephone Encounter (Signed)
Pt called to see if her lab results were back yet and to check if her hbg was up. Please call her today before 5 per patient. [phone]

## 2013-04-29 NOTE — Progress Notes (Signed)
Quick Note:  Called [employer] needed to forward information for clinical review #[phone] ______

## 2013-04-29 NOTE — Progress Notes (Signed)
Quick Note:  CT Scan/cxr cancelled ______

## 2013-05-03 ENCOUNTER — Other Ambulatory Visit (HOSPITAL_COMMUNITY): Payer: Medicare Other

## 2013-05-17 ENCOUNTER — Ambulatory Visit: Payer: Medicare Other | Admitting: Internal Medicine

## 2013-06-14 ENCOUNTER — Ambulatory Visit: Payer: Medicare Other | Admitting: Internal Medicine

## 2013-06-25 ENCOUNTER — Telehealth: Payer: Self-pay | Admitting: Gastroenterology

## 2013-06-25 NOTE — Progress Notes (Signed)
Per Soledad Gerlach pt refused to have test done.  It's documented on the appt desk of patient refusal.

## 2013-06-25 NOTE — Telephone Encounter (Signed)
Why was CXR and CT A/P cancelled? Patient related? I don't see documentation as to why just that it was cancelled.

## 2013-06-25 NOTE — Telephone Encounter (Signed)
Pt refused. See addendum to ov note with LL

## 2013-07-10 ENCOUNTER — Other Ambulatory Visit (HOSPITAL_COMMUNITY): Payer: Self-pay | Admitting: Family Medicine

## 2013-07-10 ENCOUNTER — Ambulatory Visit (HOSPITAL_COMMUNITY)
Admission: RE | Admit: 2013-07-10 | Discharge: 2013-07-10 | Disposition: A | Discharge status: Home / Self Care | Payer: Medicare Other | Source: Ambulatory Visit | Attending: Family Medicine | Admitting: Family Medicine

## 2013-07-10 DIAGNOSIS — J449 Chronic obstructive pulmonary disease, unspecified: Secondary | ICD-10-CM

## 2013-07-10 DIAGNOSIS — R634 Abnormal weight loss: Secondary | ICD-10-CM | POA: Insufficient documentation

## 2013-07-10 DIAGNOSIS — F17200 Nicotine dependence, unspecified, uncomplicated: Secondary | ICD-10-CM

## 2013-07-10 DIAGNOSIS — Z87891 Personal history of nicotine dependence: Secondary | ICD-10-CM | POA: Insufficient documentation

## 2013-07-12 ENCOUNTER — Other Ambulatory Visit (HOSPITAL_COMMUNITY): Payer: Self-pay | Admitting: Family Medicine

## 2013-07-12 DIAGNOSIS — R634 Abnormal weight loss: Secondary | ICD-10-CM

## 2013-07-16 ENCOUNTER — Ambulatory Visit (HOSPITAL_COMMUNITY): Payer: Medicare Other

## 2013-07-19 ENCOUNTER — Ambulatory Visit: Payer: Medicare Other | Admitting: Internal Medicine

## 2013-07-19 ENCOUNTER — Telehealth: Payer: Self-pay | Admitting: Internal Medicine

## 2013-07-19 NOTE — Telephone Encounter (Signed)
Mailed letter °

## 2013-07-19 NOTE — Telephone Encounter (Signed)
Pt was a no show

## 2015-04-03 ENCOUNTER — Encounter (HOSPITAL_COMMUNITY): Payer: Self-pay | Admitting: Emergency Medicine

## 2015-04-03 ENCOUNTER — Inpatient Hospital Stay (HOSPITAL_COMMUNITY)
Admission: EM | Admit: 2015-04-03 | Discharge: 2015-04-06 | DRG: 190 | Disposition: A | Discharge status: Home / Self Care | Payer: Medicare Other | Attending: Family Medicine | Admitting: Family Medicine

## 2015-04-03 ENCOUNTER — Emergency Department (HOSPITAL_COMMUNITY): Payer: Medicare Other

## 2015-04-03 DIAGNOSIS — F17210 Nicotine dependence, cigarettes, uncomplicated: Secondary | ICD-10-CM | POA: Diagnosis present

## 2015-04-03 DIAGNOSIS — G8929 Other chronic pain: Secondary | ICD-10-CM | POA: Diagnosis present

## 2015-04-03 DIAGNOSIS — E780 Pure hypercholesterolemia: Secondary | ICD-10-CM | POA: Diagnosis present

## 2015-04-03 DIAGNOSIS — J449 Chronic obstructive pulmonary disease, unspecified: Secondary | ICD-10-CM | POA: Insufficient documentation

## 2015-04-03 DIAGNOSIS — M549 Dorsalgia, unspecified: Secondary | ICD-10-CM | POA: Diagnosis present

## 2015-04-03 DIAGNOSIS — Z800 Family history of malignant neoplasm of digestive organs: Secondary | ICD-10-CM

## 2015-04-03 DIAGNOSIS — Z806 Family history of leukemia: Secondary | ICD-10-CM | POA: Diagnosis not present

## 2015-04-03 DIAGNOSIS — F17200 Nicotine dependence, unspecified, uncomplicated: Secondary | ICD-10-CM | POA: Diagnosis present

## 2015-04-03 DIAGNOSIS — G629 Polyneuropathy, unspecified: Secondary | ICD-10-CM

## 2015-04-03 DIAGNOSIS — I10 Essential (primary) hypertension: Secondary | ICD-10-CM | POA: Diagnosis present

## 2015-04-03 DIAGNOSIS — D509 Iron deficiency anemia, unspecified: Secondary | ICD-10-CM | POA: Diagnosis present

## 2015-04-03 DIAGNOSIS — E871 Hypo-osmolality and hyponatremia: Secondary | ICD-10-CM | POA: Diagnosis present

## 2015-04-03 DIAGNOSIS — F419 Anxiety disorder, unspecified: Secondary | ICD-10-CM | POA: Diagnosis present

## 2015-04-03 DIAGNOSIS — K219 Gastro-esophageal reflux disease without esophagitis: Secondary | ICD-10-CM | POA: Diagnosis present

## 2015-04-03 DIAGNOSIS — R0789 Other chest pain: Secondary | ICD-10-CM | POA: Diagnosis present

## 2015-04-03 DIAGNOSIS — Z720 Tobacco use: Secondary | ICD-10-CM | POA: Diagnosis not present

## 2015-04-03 DIAGNOSIS — R0600 Dyspnea, unspecified: Secondary | ICD-10-CM

## 2015-04-03 DIAGNOSIS — R739 Hyperglycemia, unspecified: Secondary | ICD-10-CM | POA: Diagnosis present

## 2015-04-03 DIAGNOSIS — J441 Chronic obstructive pulmonary disease with (acute) exacerbation: Secondary | ICD-10-CM | POA: Diagnosis present

## 2015-04-03 DIAGNOSIS — R0602 Shortness of breath: Secondary | ICD-10-CM | POA: Diagnosis not present

## 2015-04-03 DIAGNOSIS — J9601 Acute respiratory failure with hypoxia: Secondary | ICD-10-CM | POA: Diagnosis present

## 2015-04-03 DIAGNOSIS — R079 Chest pain, unspecified: Secondary | ICD-10-CM | POA: Diagnosis present

## 2015-04-03 DIAGNOSIS — E860 Dehydration: Secondary | ICD-10-CM | POA: Diagnosis present

## 2015-04-03 HISTORY — DX: Tobacco use: Z72.0

## 2015-04-03 HISTORY — DX: Hypo-osmolality and hyponatremia: E87.1

## 2015-04-03 HISTORY — DX: Acute respiratory failure with hypoxia: J96.01

## 2015-04-03 LAB — BASIC METABOLIC PANEL
Anion gap: 10 (ref 5–15)
BUN: 7 mg/dL (ref 6–20)
CO2: 28 mmol/L (ref 22–32)
Calcium: 9.4 mg/dL (ref 8.9–10.3)
Chloride: 87 mmol/L — ABNORMAL LOW (ref 101–111)
Creatinine, Ser: 0.65 mg/dL (ref 0.44–1.00)
GFR calc Af Amer: >60 mL/min (ref >60)
GFR calc non Af Amer: >60 mL/min (ref >60)
Glucose, Bld: 141 mg/dL — ABNORMAL HIGH (ref 65–99)
Potassium: 4.1 mmol/L (ref 3.5–5.1)
Sodium: 125 mmol/L — ABNORMAL LOW (ref 135–145)

## 2015-04-03 LAB — CBC WITH DIFFERENTIAL/PLATELET
Basophils Absolute: 0 10*3/uL (ref 0.0–0.1)
Basophils Relative: 0 % (ref 0–1)
Eosinophils Absolute: 0 10*3/uL (ref 0.0–0.7)
Eosinophils Relative: 0 % (ref 0–5)
HCT: 41 % (ref 36.0–46.0)
Hemoglobin: 14.2 g/dL (ref 12.0–15.0)
Lymphocytes Relative: 11 % — ABNORMAL LOW (ref 12–46)
Lymphs Abs: 1 10*3/uL (ref 0.7–4.0)
MCH: 29.9 pg (ref 26.0–34.0)
MCHC: 34.6 g/dL (ref 30.0–36.0)
MCV: 86.3 fL (ref 78.0–100.0)
Monocytes Absolute: 0.3 10*3/uL (ref 0.1–1.0)
Monocytes Relative: 3 % (ref 3–12)
Neutro Abs: 8.2 10*3/uL — ABNORMAL HIGH (ref 1.7–7.7)
Neutrophils Relative %: 86 % — ABNORMAL HIGH (ref 43–77)
Platelets: 308 10*3/uL (ref 150–400)
RBC: 4.75 MIL/uL (ref 3.87–5.11)
RDW: 12.9 % (ref 11.5–15.5)
WBC: 9.5 10*3/uL (ref 4.0–10.5)

## 2015-04-03 LAB — TROPONIN I
Troponin I: <0.03 ng/mL (ref <0.031)
Troponin I: <0.03 ng/mL (ref <0.031)

## 2015-04-03 LAB — TSH: TSH: 0.681 u[IU]/mL (ref 0.350–4.500)

## 2015-04-03 LAB — GLUCOSE, CAPILLARY
GLUCOSE-CAPILLARY: 156 mg/dL — AB (ref 65–99)
Glucose-Capillary: 146 mg/dL — ABNORMAL HIGH (ref 65–99)

## 2015-04-03 MED ORDER — HYDROCODONE-ACETAMINOPHEN 5-325 MG PO TABS: 1.0000 | ORAL_TABLET | ORAL | Status: DC | PRN

## 2015-04-03 MED ORDER — ONDANSETRON HCL 4 MG/2ML IJ SOLN
4.0000 mg | Freq: Four times a day (QID) | INTRAMUSCULAR | Status: DC | PRN
  Administered 2015-04-04: 4 mg via INTRAVENOUS
  Filled 2015-04-03: qty 2

## 2015-04-03 MED ORDER — GUAIFENESIN-DM 100-10 MG/5ML PO SYRP
5.0000 mL | ORAL_SOLUTION | ORAL | Status: DC | PRN
Start: 2015-04-03 — End: 2015-04-06
  Administered 2015-04-05: 5 mL via ORAL
  Filled 2015-04-03: qty 5

## 2015-04-03 MED ORDER — ALBUTEROL (5 MG/ML) CONTINUOUS INHALATION SOLN
15.0000 mg/h | INHALATION_SOLUTION | RESPIRATORY_TRACT | Status: DC
  Administered 2015-04-03: 15 mg/h via RESPIRATORY_TRACT
  Filled 2015-04-03: qty 20

## 2015-04-03 MED ORDER — SODIUM CHLORIDE 0.9 % IV SOLN
INTRAVENOUS | Status: DC
  Administered 2015-04-03: 1000 mL via INTRAVENOUS
  Administered 2015-04-05 – 2015-04-06 (×3): via INTRAVENOUS

## 2015-04-03 MED ORDER — TRAZODONE HCL 50 MG PO TABS: 25.0000 mg | ORAL_TABLET | Freq: Every evening | ORAL | Status: DC | PRN

## 2015-04-03 MED ORDER — ONDANSETRON HCL 4 MG/2ML IJ SOLN
4.0000 mg | Freq: Once | INTRAMUSCULAR | Status: AC
  Administered 2015-04-03: 4 mg via INTRAVENOUS

## 2015-04-03 MED ORDER — ALUM & MAG HYDROXIDE-SIMETH 200-200-20 MG/5ML PO SUSP: 30.0000 mL | Freq: Four times a day (QID) | ORAL | Status: DC | PRN

## 2015-04-03 MED ORDER — ACETAMINOPHEN 325 MG PO TABS
650.0000 mg | ORAL_TABLET | Freq: Four times a day (QID) | ORAL | Status: DC | PRN
Start: 2015-04-03 — End: 2015-04-06
  Administered 2015-04-04 – 2015-04-05 (×2): 650 mg via ORAL
  Filled 2015-04-03 (×2): qty 2

## 2015-04-03 MED ORDER — LORAZEPAM 1 MG PO TABS
1.0000 mg | ORAL_TABLET | Freq: Three times a day (TID) | ORAL | Status: DC | PRN
  Administered 2015-04-03 – 2015-04-06 (×8): 1 mg via ORAL
  Filled 2015-04-03 (×9): qty 1

## 2015-04-03 MED ORDER — NICOTINE 21 MG/24HR TD PT24
21.0000 mg | MEDICATED_PATCH | Freq: Every day | TRANSDERMAL | Status: DC
  Administered 2015-04-03 – 2015-04-06 (×4): 21 mg via TRANSDERMAL
  Filled 2015-04-03 (×4): qty 1

## 2015-04-03 MED ORDER — PANTOPRAZOLE SODIUM 40 MG PO TBEC
40.0000 mg | DELAYED_RELEASE_TABLET | Freq: Every day | ORAL | Status: DC
  Administered 2015-04-03 – 2015-04-06 (×4): 40 mg via ORAL
  Filled 2015-04-03 (×4): qty 1

## 2015-04-03 MED ORDER — MAGNESIUM CITRATE PO SOLN: 1.0000 | Freq: Once | ORAL | Status: AC | PRN

## 2015-04-03 MED ORDER — ONDANSETRON HCL 4 MG PO TABS: 4.0000 mg | ORAL_TABLET | Freq: Four times a day (QID) | ORAL | Status: DC | PRN

## 2015-04-03 MED ORDER — ATORVASTATIN CALCIUM 20 MG PO TABS
20.0000 mg | ORAL_TABLET | Freq: Every day | ORAL | Status: DC
  Administered 2015-04-03 – 2015-04-06 (×4): 20 mg via ORAL
  Filled 2015-04-03 (×4): qty 1

## 2015-04-03 MED ORDER — LEVOFLOXACIN 750 MG PO TABS
750.0000 mg | ORAL_TABLET | Freq: Every day | ORAL | Status: DC
  Administered 2015-04-03 – 2015-04-06 (×4): 750 mg via ORAL
  Filled 2015-04-03 (×4): qty 1

## 2015-04-03 MED ORDER — METHYLPREDNISOLONE SODIUM SUCC 125 MG IJ SOLR
80.0000 mg | Freq: Four times a day (QID) | INTRAMUSCULAR | Status: DC
  Administered 2015-04-03 – 2015-04-04 (×4): 80 mg via INTRAVENOUS
  Filled 2015-04-03 (×4): qty 2

## 2015-04-03 MED ORDER — ONDANSETRON HCL 4 MG/2ML IJ SOLN
4.0000 mg | Freq: Once | INTRAMUSCULAR | Status: DC
  Filled 2015-04-03: qty 2

## 2015-04-03 MED ORDER — IPRATROPIUM BROMIDE 0.02 % IN SOLN
0.5000 mg | Freq: Once | RESPIRATORY_TRACT | Status: AC
  Administered 2015-04-03: 0.5 mg via RESPIRATORY_TRACT
  Filled 2015-04-03: qty 2.5

## 2015-04-03 MED ORDER — DOCUSATE SODIUM 100 MG PO CAPS
100.0000 mg | ORAL_CAPSULE | Freq: Two times a day (BID) | ORAL | Status: DC
  Administered 2015-04-03 – 2015-04-06 (×5): 100 mg via ORAL
  Filled 2015-04-03 (×6): qty 1

## 2015-04-03 MED ORDER — MORPHINE SULFATE 2 MG/ML IJ SOLN: 1.0000 mg | INTRAMUSCULAR | Status: DC | PRN

## 2015-04-03 MED ORDER — ALBUTEROL SULFATE (2.5 MG/3ML) 0.083% IN NEBU: 2.5000 mg | INHALATION_SOLUTION | RESPIRATORY_TRACT | Status: DC | PRN

## 2015-04-03 MED ORDER — INSULIN ASPART 100 UNIT/ML ~~LOC~~ SOLN: 0.0000 [IU] | Freq: Every day | SUBCUTANEOUS | Status: DC

## 2015-04-03 MED ORDER — INSULIN ASPART 100 UNIT/ML ~~LOC~~ SOLN
0.0000 [IU] | Freq: Three times a day (TID) | SUBCUTANEOUS | Status: DC
  Administered 2015-04-03 – 2015-04-05 (×4): 2 [IU] via SUBCUTANEOUS

## 2015-04-03 MED ORDER — ACETAMINOPHEN 650 MG RE SUPP: 650.0000 mg | Freq: Four times a day (QID) | RECTAL | Status: DC | PRN

## 2015-04-03 MED ORDER — ENOXAPARIN SODIUM 40 MG/0.4ML ~~LOC~~ SOLN
40.0000 mg | SUBCUTANEOUS | Status: DC
  Administered 2015-04-04 – 2015-04-05 (×2): 40 mg via SUBCUTANEOUS
  Filled 2015-04-03 (×3): qty 0.4

## 2015-04-03 MED ORDER — IPRATROPIUM-ALBUTEROL 0.5-2.5 (3) MG/3ML IN SOLN
3.0000 mL | RESPIRATORY_TRACT | Status: DC
  Administered 2015-04-03 (×2): 3 mL via RESPIRATORY_TRACT
  Filled 2015-04-03 (×2): qty 3

## 2015-04-03 MED ORDER — METHYLPREDNISOLONE SODIUM SUCC 125 MG IJ SOLR
80.0000 mg | Freq: Once | INTRAMUSCULAR | Status: AC
  Administered 2015-04-03: 80 mg via INTRAVENOUS
  Filled 2015-04-03: qty 2

## 2015-04-03 MED ORDER — SORBITOL 70 % SOLN: 30.0000 mL | Freq: Every day | Status: DC | PRN

## 2015-04-03 MED ORDER — MORPHINE SULFATE 4 MG/ML IJ SOLN
4.0000 mg | Freq: Once | INTRAMUSCULAR | Status: AC
  Administered 2015-04-03: 4 mg via INTRAVENOUS
  Filled 2015-04-03: qty 1

## 2015-04-03 NOTE — ED Notes (Signed)
Pt states that MD came and spoke with her.  States that she needs medication for nausea.  Awaiting order.

## 2015-04-03 NOTE — ED Provider Notes (Signed)
CSN: [id number]     Arrival date & time 04/03/15  2542 History  This chart was scribed for Virgel Manifold, MD by Eustaquio Maize, ED Scribe. This patient was seen in room APA19/APA19 and the patient's care was started at 10:35 AM.    Chief Complaint  Patient presents with  . Shortness of Breath   The history is provided by the patient. No language interpreter was used.     HPI Comments: Lindsey Combs is a 69 y.o. female with hx COPD and HTN who presents to the Emergency Department complaining of increasing shortness of breath x 2 weeks. She also notes dyspnea on exertion which is not normal for her. Pt also complains of headaches and intermittent sternal chest pain x 2 weeks. The pain is exacerbated with strenuous activity.  She also complains of fever. Denies chills, leg swelling, or any other symptoms. Pt has seen her PCP for her increasing shortness of breath 2 days ago and was put on Prednisone and Z pack at that time. Pt is not currently on anti-coagulants.    Past Medical History  Diagnosis Date  . Anxiety   . Hypertension   . COPD (chronic obstructive pulmonary disease)   . Hypercholesterolemia   . Diverticulosis   . Peripheral neuropathy   . DDD (degenerative disc disease), lumbar   . Shortness of breath     with exertion  . Degenerative joint disease of spine   . GERD (gastroesophageal reflux disease)   . H. pylori infection     treated 02/2013.   Past Surgical History  Procedure Laterality Date  . Colonoscopy  10/16/09    Jenkins:3 polypsin the sigmoid colon/polyps in the descending colon and the rectum/scattered diverticulum. hyperplastic  . Partial hysterectomy      age 75  . Abdominal exploration surgery      age 16, large ovarian cyst  . Colonoscopy with esophagogastroduodenoscopy (egd) N/A 03/01/2013    HCW:CBJSEGBT colonic polyps - treated/removed as described above. Colonic diverticulosis. Hyperplastic. Next TCS 02/2018.  [name] Esophageal biopsy N/A 03/01/2013   DVV:OHYW hiatal hernia; otherwise, normal examination/ Status post biopsies as described above. SB bx negative for Celiac. +h/pylori   Family History  Problem Relation Age of Onset  . Stomach cancer Paternal Grandfather   . Leukemia Father   . Colon cancer Neg Hx   . Thyroid disease Father    History  Substance Use Topics  . Smoking status: Current Every Day Smoker -- 1.50 packs/day for 50 years    Types: Cigarettes  . Smokeless tobacco: Never Used  . Alcohol Use: No     Comment: glass of wine occasionally   OB History    Gravida Para Term Preterm AB TAB SAB Ectopic Multiple Living   '5 4 4  1  1        '$ Review of Systems  Constitutional: Positive for fever. Negative for chills.  Respiratory: Positive for shortness of breath.   Cardiovascular: Positive for chest pain. Negative for leg swelling.  Neurological: Positive for headaches.  All other systems reviewed and are negative.     Allergies  Chantix and Codeine  Home Medications   Prior to Admission medications   Medication Sig Start Date End Date Taking? Authorizing Provider  acetaminophen (TYLENOL) 500 MG tablet Take 1,000 mg by mouth as needed for pain.     Historical Provider, MD  albuterol (PROAIR HFA) 108 (90 BASE) MCG/ACT inhaler Inhale 2 puffs into the lungs every 6 (six) hours  as needed.    Historical Provider, MD  atorvastatin (LIPITOR) 40 MG tablet Take 40 mg by mouth daily.  01/29/13   Historical Provider, MD  cetirizine (ZYRTEC) 10 MG tablet Take 10 mg by mouth daily.    Historical Provider, MD  clonazePAM (KLONOPIN) 1 MG tablet Take 0.5-1 mg by mouth 3 (three) times daily as needed for anxiety.  02/12/13   Historical Provider, MD  fluticasone (FLONASE) 50 MCG/ACT nasal spray Place 1 spray into the nose daily.  02/12/13   Historical Provider, MD  HYDROcodone-acetaminophen (NORCO/VICODIN) 5-325 MG per tablet Take 1 tablet by mouth every 6 (six) hours as needed for pain. 04/19/13   Mahala Menghini, PA-C  omeprazole  (PRILOSEC) 40 MG capsule Take 40 mg by mouth daily.  01/29/13   Historical Provider, MD   Triage Vitals: BP 225/92 mmHg  Pulse 108  Temp(Src) 97.9 F (36.6 C) (Oral)  Resp 20  Ht 5' 2.5" (1.588 m)  Wt 116 lb (52.617 kg)  BMI 20.87 kg/m2  SpO2 96%   Physical Exam  Constitutional: She is oriented to person, place, and time. She appears well-developed and well-nourished. No distress.  HENT:  Head: Normocephalic and atraumatic.  Eyes: Conjunctivae and EOM are normal.  Neck: Neck supple. No tracheal deviation present.  Cardiovascular: Normal rate.   Pulmonary/Chest: Effort normal. No respiratory distress. She has wheezes.  Extremly wheezy bilaterally.  Prolonged expiratory phase.   Musculoskeletal: Normal range of motion. She exhibits edema.  No lower extremity edema.  No calf tenderness.   Neurological: She is alert and oriented to person, place, and time.  Skin: Skin is warm and dry.  Psychiatric: She has a normal mood and affect. Her behavior is normal.  Nursing note and vitals reviewed.   ED Course  Procedures (including critical care time)  DIAGNOSTIC STUDIES: Oxygen Saturation is 96% on RA, adequate by my interpretation.    COORDINATION OF CARE: 10:41 AM-Discussed treatment plan which includes CXR, breathing treatment with pt at bedside and pt agreed to plan.   Labs Review Labs Reviewed  CBC WITH DIFFERENTIAL/PLATELET - Abnormal; Notable for the following:    Neutrophils Relative % 86 (*)    Neutro Abs 8.2 (*)    Lymphocytes Relative 11 (*)    All other components within normal limits  BASIC METABOLIC PANEL - Abnormal; Notable for the following:    Sodium 125 (*)    Chloride 87 (*)    Glucose, Bld 141 (*)    All other components within normal limits  HEMOGLOBIN A1C - Abnormal; Notable for the following:    Hgb A1c MFr Bld 5.9 (*)    All other components within normal limits  BASIC METABOLIC PANEL - Abnormal; Notable for the following:    Sodium 124 (*)     Chloride 86 (*)    Glucose, Bld 134 (*)    All other components within normal limits  GLUCOSE, CAPILLARY - Abnormal; Notable for the following:    Glucose-Capillary 146 (*)    All other components within normal limits  GLUCOSE, CAPILLARY - Abnormal; Notable for the following:    Glucose-Capillary 156 (*)    All other components within normal limits  GLUCOSE, CAPILLARY - Abnormal; Notable for the following:    Glucose-Capillary 136 (*)    All other components within normal limits  GLUCOSE, CAPILLARY - Abnormal; Notable for the following:    Glucose-Capillary 104 (*)    All other components within normal limits  URINALYSIS, ROUTINE W  REFLEX MICROSCOPIC (NOT AT [facility]) - Abnormal; Notable for the following:    Hgb urine dipstick MODERATE (*)    Protein, ur TRACE (*)    All other components within normal limits  BASIC METABOLIC PANEL - Abnormal; Notable for the following:    Sodium 128 (*)    Chloride 88 (*)    Glucose, Bld 123 (*)    All other components within normal limits  GLUCOSE, CAPILLARY - Abnormal; Notable for the following:    Glucose-Capillary 131 (*)    All other components within normal limits  GLUCOSE, CAPILLARY - Abnormal; Notable for the following:    Glucose-Capillary 151 (*)    All other components within normal limits  GLUCOSE, CAPILLARY - Abnormal; Notable for the following:    Glucose-Capillary 121 (*)    All other components within normal limits  GLUCOSE, CAPILLARY - Abnormal; Notable for the following:    Glucose-Capillary 108 (*)    All other components within normal limits  GLUCOSE, CAPILLARY - Abnormal; Notable for the following:    Glucose-Capillary 144 (*)    All other components within normal limits  BASIC METABOLIC PANEL - Abnormal; Notable for the following:    Sodium 134 (*)    Potassium 3.3 (*)    Chloride 94 (*)    CO2 33 (*)    Glucose, Bld 109 (*)    Calcium 8.7 (*)    All other components within normal limits  GLUCOSE, CAPILLARY -  Abnormal; Notable for the following:    Glucose-Capillary 124 (*)    All other components within normal limits  GLUCOSE, CAPILLARY - Abnormal; Notable for the following:    Glucose-Capillary 106 (*)    All other components within normal limits  GLUCOSE, CAPILLARY - Abnormal; Notable for the following:    Glucose-Capillary 115 (*)    All other components within normal limits  TROPONIN I  TSH  TROPONIN I  TROPONIN I  URINE MICROSCOPIC-ADD ON    Imaging Review No results found.   Dg Chest 2 View  04/03/2015   CLINICAL DATA:  Two week history of wheezing and congestion  EXAM: CHEST  2 VIEW  COMPARISON:  July 10, 2013  FINDINGS: Lungs remain somewhat hyperexpanded. There is no edema or consolidation. Heart size and pulmonary vascularity within normal limits. No adenopathy. No bone lesions.  IMPRESSION: Lungs hyperexpanded.  No edema or consolidation.   Electronically Signed   By: Lowella Grip III M.D.   On: 04/03/2015 11:38     EKG Interpretation   Date/Time:  Friday April 03 2015 10:22:03 EDT Ventricular Rate:  104 PR Interval:  160 QRS Duration: 92 QT Interval:  358 QTC Calculation: 471 R Axis:   79 Text Interpretation:  Sinus tachycardia Biatrial enlargement increased  rate, otherwise similar to prevous EKG 02/2011 Confirmed by Wilson Singer  MD,  [place] ([id number]) on 04/03/2015 10:26:54 AM      MDM   Final diagnoses:  Dyspnea  Acute Respiratory failure.     I personally preformed the services scribed in my presence. The recorded information has been reviewed is accurate. Virgel Manifold, MD.      Virgel Manifold, MD 04/10/15 [phone]

## 2015-04-03 NOTE — H&P (Signed)
[facility] History and Physical  Lindsey Combs TKZ:[id number] DOB: 1945-12-18 DOA: 04/03/2015  Referring physician: Wilson Singer PCP: Robert Bellow, MD   Chief Complaint: shortness of breath  HPI: Lindsey Combs is a pleasant 69 y.o. female with a past medical history that includes COPD not on home oxygen were home nebulizers, tobacco use, hypertension, chronic pain related to degenerative disc disease, GERD, peripheral neuropathy presents to the emergency department with the chief complaint of worsening shortness of breath. Initial evaluation in the emergency department reveals acute respiratory failure with hypoxia on ambulation and dehydration. Patient reports that 2 weeks ago she was outdoors on awake for many hours and afterwards developed worsening shortness of breath. She used her rescue inhaler which she reports she typically uses 1 or 2 times a day without relief. In the course of the next few days she developed productive cough with white thick sputum, chills, nausea with no vomiting and intermittent diarrhea.  In addition she developed bilateral anterior chest pain worse with coughing. She denies lower extremity edema or orthopnea, headache visual disturbances syncope or near-syncope. During this time she went to her primary care provider she was provided with steoid injection as well as anabiotic injection. There was little improvement. 2 days ago she went back to her primary care provider is provided with more steroids and oral anabiotic's as well as nebulizers.  Workup in the emergency department significant for sodium of 125, chloride 87. Initial troponin is negative. Serum glucose 141. Chest x-ray with hyperinflation no infiltrate. EKG with sinus tachycardia bilateral enlargement similar to the tracing in 2012. While in the emergency department she is afebrile hemodynamically stable and hypoxic with oxygen saturation level 70% with ambulation on room air. He is provided with  nebulizer treatment 80 mg of Solu-Medrol and oxygen supplementation. At the time my exam oxygen saturation level 98% on 2 L  Review of Systems:  10 point review of systems complete and all systems are negative except as indicated in the history of present illness   Past Medical History  Diagnosis Date  . Anxiety   . Hypertension   . COPD (chronic obstructive pulmonary disease)   . Hypercholesterolemia   . Diverticulosis   . Peripheral neuropathy   . DDD (degenerative disc disease), lumbar   . Shortness of breath     with exertion  . Degenerative joint disease of spine   . GERD (gastroesophageal reflux disease)   . H. pylori infection     treated 02/2013.  [name] Acute respiratory failure with hypoxia   . Hyponatremia   . Tobacco abuse    Past Surgical History  Procedure Laterality Date  . Colonoscopy  10/16/09    Jenkins:3 polypsin the sigmoid colon/polyps in the descending colon and the rectum/scattered diverticulum. hyperplastic  . Partial hysterectomy      age 54  . Abdominal exploration surgery      age 86, large ovarian cyst  . Colonoscopy with esophagogastroduodenoscopy (egd) N/A 03/01/2013    TDD:UKGURKYH colonic polyps - treated/removed as described above. Colonic diverticulosis. Hyperplastic. Next TCS 02/2018.  [name] Esophageal biopsy N/A 03/01/2013    CWC:BJSE hiatal hernia; otherwise, normal examination/ Status post biopsies as described above. SB bx negative for Celiac. +h/pylori   Social History:  reports that she has been smoking Cigarettes.  She has a 75 pack-year smoking history. She has never used smokeless tobacco. She reports that she does not drink alcohol or use illicit drugs. He lives alone at a senior housing center  she is independent with ADLs Allergies  Allergen Reactions  . Chantix [Varenicline] Other (See Comments)    Mental status changes  . Codeine Itching    Family History  Problem Relation Age of Onset  . Stomach cancer Paternal Grandfather   . Leukemia  Father   . Colon cancer Neg Hx   . Thyroid disease Father      Prior to Admission medications   Medication Sig Start Date End Date Taking? Authorizing Provider  acetaminophen (TYLENOL) 500 MG tablet Take 500 mg by mouth every 8 (eight) hours as needed for mild pain.    Yes Historical Provider, MD  atorvastatin (LIPITOR) 20 MG tablet Take 20 mg by mouth daily.   Yes Historical Provider, MD  azithromycin (ZITHROMAX Z-PAK) 250 MG tablet Take 250-500 mg by mouth daily. Started 04/01/15   Yes Historical Provider, MD  ipratropium-albuterol (DUONEB) 0.5-2.5 (3) MG/3ML SOLN Inhale 3 mLs into the lungs every 6 (six) hours as needed. 04/01/15  Yes Historical Provider, MD  LORazepam (ATIVAN) 1 MG tablet Take 1 mg by mouth 3 (three) times daily as needed for anxiety.   Yes Historical Provider, MD  omeprazole (PRILOSEC) 20 MG capsule Take 20 mg by mouth daily.   Yes Historical Provider, MD  predniSONE (DELTASONE) 10 MG tablet Take 10 mg by mouth daily with breakfast. 2tabs;twice a day;x 3days, 1tab;twice a day;x3days,then 1tab thereafter(started 04/02/15 @'@4pm'$ )   Yes Historical Provider, MD  albuterol (PROAIR HFA) 108 (90 BASE) MCG/ACT inhaler Inhale 2 puffs into the lungs every 4 (four) hours as needed for wheezing or shortness of breath.     Historical Provider, MD   Physical Exam: Filed Vitals:   04/03/15 1200 04/03/15 1230 04/03/15 1300 04/03/15 1330  BP: 176/81 127/64 151/79 144/71  Pulse: 96 100 111 108  Temp:      TempSrc:      Resp: '16 16 20 22  '$ Height:      Weight:      SpO2: 94% 100% 95% 95%    Wt Readings from Last 3 Encounters:  04/03/15 52.617 kg (116 lb)  04/19/13 56.065 kg (123 lb 9.6 oz)  02/19/13 57.153 kg (126 lb)    General:  Appears thin slightly anxious but comfortable Eyes: PERRL, normal lids, irises & conjunctiva ENT: grossly normal hearing, because membranes of her mouth are pink but dry Neck: no LAD, masses or thyromegaly Cardiovascular: RRR, no m/r/g. No LE edema. Pedal  pulses present and palpable Telemetry: SR, no arrhythmias  Respiratory: Mild increased work of breathing with conversation. Decreased breath sounds throughout extended expiratory phase diffuse wheezing. Abdomen: soft, ntnd no mass organomegaly noted Skin: no rash or induration seen on limited exam Musculoskeletal: grossly normal tone BUE/BLE Psychiatric: grossly normal mood and affect, speech fluent and appropriate Neurologic: grossly non-focal. Each clear facial symmetry           Labs on Admission:  Basic Metabolic Panel:  Recent Labs Lab 04/03/15 1107  NA 125*  K 4.1  CL 87*  CO2 28  GLUCOSE 141*  BUN 7  CREATININE 0.65  CALCIUM 9.4   Liver Function Tests: No results for input(s): AST, ALT, ALKPHOS, BILITOT, PROT, ALBUMIN in the last 168 hours. No results for input(s): LIPASE, AMYLASE in the last 168 hours. No results for input(s): AMMONIA in the last 168 hours. CBC:  Recent Labs Lab 04/03/15 1107  WBC 9.5  NEUTROABS 8.2*  HGB 14.2  HCT 41.0  MCV 86.3  PLT 308   Cardiac  Enzymes:  Recent Labs Lab 04/03/15 1107  TROPONINI <0.03    BNP (last 3 results) No results for input(s): BNP in the last 8760 hours.  ProBNP (last 3 results) No results for input(s): PROBNP in the last 8760 hours.  CBG: No results for input(s): GLUCAP in the last 168 hours.  Radiological Exams on Admission: Dg Chest 2 View  04/03/2015   CLINICAL DATA:  Two week history of wheezing and congestion  EXAM: CHEST  2 VIEW  COMPARISON:  July 10, 2013  FINDINGS: Lungs remain somewhat hyperexpanded. There is no edema or consolidation. Heart size and pulmonary vascularity within normal limits. No adenopathy. No bone lesions.  IMPRESSION: Lungs hyperexpanded.  No edema or consolidation.   Electronically Signed   By: Lowella Grip III M.D.   On: 04/03/2015 11:38    EKG: Independently reviewed sinus tach with bilateral enlargement  Assessment/Plan Principal Problem:   Acute respiratory  failure with hypoxia: Related to COPD exacerbation. Will admit to medical floor. Will continue nebulizers every 4 hours Solu-Medrol every 6 hours and oxygen supplementation. Will add Levaquin and flutter valve. When necessary Robitussin. Monitor closely Active Problems: COPD exacerbation; may be related to whether this episode [place] 2 [place] Also suspect there may be a worsening of underlying disease that needs to be evaluated. She reports using her rescue inhaler 1 or 2 times a day at her baseline. She reports not getting short of breath when walking to the grocery store but states "I can lean on the cart". See #1. Will likely benefit from a function tests once this acute episode resolved. Spectral need home nebulizers at discharge.    Hyponatremia: Related to decreased by mouth intake and setting of #1 and #2. I will provide gentle IV fluids. Will recheck in the morning.  Chest pain: Atypical and likely musculoskeletal. Worse with coughing. Initial troponin negative. EKG without acute changes. We'll cycle enzymes.    Dehydration: IV fluids. Related to above.    Tobacco abuse: Continues to smoke. Counseling cessation offered.    Hyperglycemia: Likely related to stool once that she's been taken over the last 10 days. Will check a hemoglobin A1c and use sliding scale insulin for optimal control as I plan to continue steroids for now      GERD (gastroesophageal reflux disease): Stable at baseline. Will continue home medications    Back pain, chronic: Appears stable at baseline. Patient states on hydrocodone in past but stopped taking it due to complications of constipation. Will provide stool softener.    Anxiety: Appears stable at baseline. Patient reports taking 1 mg of Ativan at least 2 times a day sometimes 3 times a day. We'll continue this on an as-needed basis.    Peripheral neuropathy: Stable at baseline      Code Status: full DVT  Prophylaxis: Family Communication: none present Disposition Plan: home when ready hopefullyt 24-36 hours  Time spent: 60 minutes  [facility] Pager [phone]

## 2015-04-03 NOTE — ED Notes (Signed)
Pt ambulated to restroom.  Became very SOB in bathroom.  O2 sat was 70% after returning to room.  Dr Wilson Singer made aware.  Pts sats returned to 98% with 2L.

## 2015-04-03 NOTE — ED Notes (Signed)
Pt reports increasing SOB and dyspnea with exertion. Pt has hx of COPD. Pt reports diarrhea with dark stools. Pt states she has had intermittent fever. Pt seen by PCP yesterday evening, given neb tx and prednisone. Pt was also seen on Wed and given z pack.

## 2015-04-04 LAB — URINALYSIS, ROUTINE W REFLEX MICROSCOPIC
Bilirubin Urine: NEGATIVE
Glucose, UA: NEGATIVE mg/dL
KETONES UR: NEGATIVE mg/dL
LEUKOCYTES UA: NEGATIVE
NITRITE: NEGATIVE
Specific Gravity, Urine: 1.01 (ref 1.005–1.030)
Urobilinogen, UA: 0.2 mg/dL (ref 0.0–1.0)
pH: 6 (ref 5.0–8.0)

## 2015-04-04 LAB — GLUCOSE, CAPILLARY
GLUCOSE-CAPILLARY: 136 mg/dL — AB (ref 65–99)
Glucose-Capillary: 104 mg/dL — ABNORMAL HIGH (ref 65–99)
Glucose-Capillary: 131 mg/dL — ABNORMAL HIGH (ref 65–99)
Glucose-Capillary: 151 mg/dL — ABNORMAL HIGH (ref 65–99)

## 2015-04-04 LAB — BASIC METABOLIC PANEL
ANION GAP: 11 (ref 5–15)
BUN: 10 mg/dL (ref 6–20)
CHLORIDE: 86 mmol/L — AB (ref 101–111)
CO2: 27 mmol/L (ref 22–32)
CREATININE: 0.68 mg/dL (ref 0.44–1.00)
Calcium: 9 mg/dL (ref 8.9–10.3)
GFR calc non Af Amer: >60 mL/min (ref >60)
Glucose, Bld: 134 mg/dL — ABNORMAL HIGH (ref 65–99)
Potassium: 3.8 mmol/L (ref 3.5–5.1)
SODIUM: 124 mmol/L — AB (ref 135–145)

## 2015-04-04 LAB — URINE MICROSCOPIC-ADD ON

## 2015-04-04 LAB — HEMOGLOBIN A1C
Hgb A1c MFr Bld: 5.9 % — ABNORMAL HIGH (ref 4.8–5.6)
Mean Plasma Glucose: 123 mg/dL

## 2015-04-04 LAB — TROPONIN I

## 2015-04-04 MED ORDER — METHYLPREDNISOLONE SODIUM SUCC 40 MG IJ SOLR
40.0000 mg | Freq: Four times a day (QID) | INTRAMUSCULAR | Status: DC
  Administered 2015-04-04 – 2015-04-05 (×4): 40 mg via INTRAVENOUS
  Filled 2015-04-04 (×4): qty 1

## 2015-04-04 MED ORDER — IPRATROPIUM-ALBUTEROL 0.5-2.5 (3) MG/3ML IN SOLN
3.0000 mL | RESPIRATORY_TRACT | Status: DC
  Administered 2015-04-04 – 2015-04-06 (×12): 3 mL via RESPIRATORY_TRACT
  Filled 2015-04-04 (×12): qty 3

## 2015-04-04 NOTE — Progress Notes (Signed)
Utilization review Completed Dimitry Holsworth RN BSN   

## 2015-04-04 NOTE — Progress Notes (Addendum)
PROGRESS NOTE  Lindsey Combs [id number] DOB: Dec 25, 1945 DOA: 04/03/2015 PCP: Robert Bellow, MD  Summary: 32yow PMH COPD presented with 2 week h/o illness, SOB. Recently treated by PCP with prednisone and azithromycin starting 7/6. She presented with symptomatology suggestive of COPD exacerbation, was found to have acute hypoxic respiratory failure and admitted for further evaluation and treatment.  Assessment/Plan: 1. Acute hypoxic respiratory failure secondary to COPD, stable.  2. Acute COPD exacerbation, slow to improve. 3. Hyponatremia, no significant change. Asymptomatic. Low chloride suggests dehydration. Plan to continue IV fluids, free water restriction. 4. Atypical chest pain. Troponins negative. EKG nonacute. No further evaluation suggested. 5. Hyperglycemia. Hemoglobin A1c within normal limits. Modest on steroids. Monitor clinically. 6. Tobacco dependence. Recommend cessation.   Modest improvement. Plan to continue steroids, antibiotics, broncho-dilators, oxygen.  Continue IV saline infusion, recheck basic metabolic panel in the morning. Free water restriction.  Anticipate discharge next 2-3 days.  Code Status: full code DVT prophylaxis: Lovenox Family Communication: Discussed with daughter at bedside. Disposition Plan: home  Murray Hodgkins, MD  [facility]  Pager [phone] If 7PM-7AM, please contact night-coverage at [email or web address], password [facility] 04/04/2015, 11:39 AM  LOS: 1 day   Consultants:  None   Procedures:  None   Antibiotics:  Levofloxacin 7/8 >>  HPI/Subjective: Still short of breath but breathing a little bit easier with ambulation. Reports some lower abdominal pain.  Objective: Filed Vitals:   04/03/15 2115 04/03/15 2330 04/04/15 0505 04/04/15 0753  BP: 150/66  170/81   Pulse: 88  93   Temp: 97.8 F (36.6 C)  97.9 F (36.6 C)   TempSrc: Oral  Oral   Resp: 18  16   Height:      Weight:      SpO2: 98% 95% 100% 95%     Intake/Output Summary (Last 24 hours) at 04/04/15 1139 Last data filed at 04/03/15 2125  Gross per 24 hour  Intake    240 ml  Output    400 ml  Net   -160 ml     Filed Weights   04/03/15 1003 04/03/15 1710  Weight: 52.617 kg (116 lb) 53.615 kg (118 lb 3.2 oz)    Exam:     Afebrile, VSS, SpO2 95% on 3L [place] General:  Appears calm and mildly uncomfortable Cardiovascular: RRR, no m/r/g. No LE edema. Respiratory: Diffuse bilateral wheeze. No rhonchi or rales. Mild increased respiratory effort. Speaks in full sentences. Psychiatric: grossly normal mood and affect, speech fluent and appropriate  New data reviewed:  Sodium without significant change, 124. Chloride 86. Remainder of basic metabolic panel unremarkable.  Troponins negative.  Hemoglobin A1c 5.9.  Pertinent data since admission:  Chest x-ray no acute disease  Pending data:    Scheduled Meds: . atorvastatin  20 mg Oral Daily  . docusate sodium  100 mg Oral BID  . enoxaparin (LOVENOX) injection  40 mg Subcutaneous Q24H  . insulin aspart  0-15 Units Subcutaneous TID WC  . insulin aspart  0-5 Units Subcutaneous QHS  . ipratropium-albuterol  3 mL Inhalation Q4H WA  . levofloxacin  750 mg Oral Daily  . methylPREDNISolone (SOLU-MEDROL) injection  80 mg Intravenous Q6H  . nicotine  21 mg Transdermal Daily  . pantoprazole  40 mg Oral Daily   Continuous Infusions: . sodium chloride 125 mL/hr at 04/04/15 0818    Principal Problem:   Acute respiratory failure with hypoxia Active Problems:   Anemia, iron deficiency   GERD (gastroesophageal reflux disease)   Back  pain, chronic   Anxiety   Peripheral neuropathy   COPD exacerbation   Hyponatremia   Dehydration   Tobacco abuse   Hyperglycemia   Chest pain   Time spent 20 minutes

## 2015-04-05 DIAGNOSIS — F17200 Nicotine dependence, unspecified, uncomplicated: Secondary | ICD-10-CM

## 2015-04-05 LAB — BASIC METABOLIC PANEL
Anion gap: 10 (ref 5–15)
BUN: 8 mg/dL (ref 6–20)
CALCIUM: 9.5 mg/dL (ref 8.9–10.3)
CO2: 30 mmol/L (ref 22–32)
CREATININE: 0.69 mg/dL (ref 0.44–1.00)
Chloride: 88 mmol/L — ABNORMAL LOW (ref 101–111)
GFR calc Af Amer: >60 mL/min (ref >60)
Glucose, Bld: 123 mg/dL — ABNORMAL HIGH (ref 65–99)
Potassium: 3.6 mmol/L (ref 3.5–5.1)
Sodium: 128 mmol/L — ABNORMAL LOW (ref 135–145)

## 2015-04-05 LAB — GLUCOSE, CAPILLARY
GLUCOSE-CAPILLARY: 121 mg/dL — AB (ref 65–99)
Glucose-Capillary: 108 mg/dL — ABNORMAL HIGH (ref 65–99)
Glucose-Capillary: 144 mg/dL — ABNORMAL HIGH (ref 65–99)
Glucose-Capillary: 124 mg/dL — ABNORMAL HIGH (ref 65–99)

## 2015-04-05 MED ORDER — METHYLPREDNISOLONE SODIUM SUCC 40 MG IJ SOLR
40.0000 mg | Freq: Two times a day (BID) | INTRAMUSCULAR | Status: DC
  Administered 2015-04-06 (×2): 40 mg via INTRAVENOUS
  Filled 2015-04-05 (×2): qty 1

## 2015-04-05 NOTE — Progress Notes (Signed)
PROGRESS NOTE  Lindsey Combs [id number] DOB: Nov 27, 1945 DOA: 04/03/2015 PCP: Robert Bellow, MD  Summary: 62yow PMH COPD presented with 2 week h/o illness, SOB. Recently treated by PCP with prednisone and azithromycin starting 7/6. She presented with symptomatology suggestive of COPD exacerbation, was found to have acute hypoxic respiratory failure and admitted for further evaluation and treatment.  Assessment/Plan: 1. Acute hypoxic respiratory failure secondary to COPD, improved.  2. Acute COPD exacerbation, slowly improving. 3. Hyponatremia, improved. Asymptomatic. Low chloride suggests dehydration. Continue IV fluids, free water restriction. 4. Atypical chest pain. Troponins negative. EKG nonacute. No further evaluation suggested. 5. Hyperglycemia. Hemoglobin A1c within normal limits. Modest on steroids. No further evaluation. 6. Tobacco dependence. Recommend cessation.   Improving.   Plan to continue steroids, antibiotics, broncho-dilators.  Wean oxygen  Continue IV saline infusion, recheck basic metabolic panel in the morning. Continue free water restriction.  Anticipate discharge next 1-2 days.  Code Status: full code DVT prophylaxis: Lovenox Family Communication: none present. Patient alert and understands plan. Disposition Plan: home  Murray Hodgkins, MD  [facility]  Pager [phone] If 7PM-7AM, please contact night-coverage at [email or web address], password [facility] 04/05/2015, 1:33 PM  LOS: 2 days   Consultants:  None   Procedures:  None   Antibiotics:  Levofloxacin 7/8 >>  HPI/Subjective: Feeling better. Breathing a little better, less SOB when getting up to the bathroom. Eating ok.  Objective: Filed Vitals:   04/05/15 0338 04/05/15 0424 04/05/15 0849 04/05/15 1248  BP:  142/69    Pulse:  91    Temp:  98.3 F (36.8 C)    TempSrc:  Oral    Resp:  18    Height:      Weight:      SpO2: 92% 96% 90% 98%    Intake/Output Summary (Last 24  hours) at 04/05/15 1333 Last data filed at 04/05/15 1002  Gross per 24 hour  Intake    240 ml  Output   1400 ml  Net  -1160 ml     Filed Weights   04/03/15 1003 04/03/15 1710  Weight: 52.617 kg (116 lb) 53.615 kg (118 lb 3.2 oz)    Exam:     Afebrile, VSS, SpO2 98% on 2L [place] General:  Appears comfortable, calm. Cardiovascular: Regular rate and rhythm, no murmur, rub or gallop. No lower extremity edema. Respiratory: decreased breath sounds, no frank wheezes, rales or rhonchi. Decreased respiratory effort. Psychiatric: grossly normal mood and affect, speech fluent and appropriate  New data reviewed:  Sodium up to 128. Cl up to 88. Remainder of basic metabolic panel unremarkable.  CBG stable.  Urinalysis unremarkable  Pertinent data since admission:  Chest x-ray no acute disease  Troponins negative.  Hemoglobin A1c 5.9.  Pending data:    Scheduled Meds: . atorvastatin  20 mg Oral Daily  . docusate sodium  100 mg Oral BID  . enoxaparin (LOVENOX) injection  40 mg Subcutaneous Q24H  . insulin aspart  0-15 Units Subcutaneous TID WC  . insulin aspart  0-5 Units Subcutaneous QHS  . ipratropium-albuterol  3 mL Inhalation Q4H WA  . levofloxacin  750 mg Oral Daily  . methylPREDNISolone (SOLU-MEDROL) injection  40 mg Intravenous Q6H  . nicotine  21 mg Transdermal Daily  . pantoprazole  40 mg Oral Daily   Continuous Infusions: . sodium chloride 125 mL/hr at 04/05/15 1001    Principal Problem:   Acute respiratory failure with hypoxia Active Problems:   Anemia, iron deficiency   GERD (gastroesophageal  reflux disease)   Back pain, chronic   Anxiety   Peripheral neuropathy   COPD exacerbation   Hyponatremia   Dehydration   Tobacco abuse   Hyperglycemia   Chest pain   Time spent 15 minutes

## 2015-04-06 DIAGNOSIS — J441 Chronic obstructive pulmonary disease with (acute) exacerbation: Secondary | ICD-10-CM | POA: Insufficient documentation

## 2015-04-06 LAB — GLUCOSE, CAPILLARY
GLUCOSE-CAPILLARY: 106 mg/dL — AB (ref 65–99)
Glucose-Capillary: 115 mg/dL — ABNORMAL HIGH (ref 65–99)

## 2015-04-06 LAB — BASIC METABOLIC PANEL
Anion gap: 7 (ref 5–15)
BUN: 7 mg/dL (ref 6–20)
CALCIUM: 8.7 mg/dL — AB (ref 8.9–10.3)
CO2: 33 mmol/L — ABNORMAL HIGH (ref 22–32)
Chloride: 94 mmol/L — ABNORMAL LOW (ref 101–111)
Creatinine, Ser: 0.57 mg/dL (ref 0.44–1.00)
GFR calc Af Amer: >60 mL/min (ref >60)
Glucose, Bld: 109 mg/dL — ABNORMAL HIGH (ref 65–99)
POTASSIUM: 3.3 mmol/L — AB (ref 3.5–5.1)
SODIUM: 134 mmol/L — AB (ref 135–145)

## 2015-04-06 MED ORDER — HYDROCODONE-ACETAMINOPHEN 5-325 MG PO TABS: 1.0000 | ORAL_TABLET | Freq: Four times a day (QID) | ORAL | Status: DC | PRN

## 2015-04-06 MED ORDER — ALBUTEROL SULFATE HFA 108 (90 BASE) MCG/ACT IN AERS: 2.0000 | INHALATION_SPRAY | RESPIRATORY_TRACT | Status: DC | PRN

## 2015-04-06 MED ORDER — NICOTINE 21 MG/24HR TD PT24: 21.0000 mg | MEDICATED_PATCH | Freq: Every day | TRANSDERMAL | Status: DC

## 2015-04-06 MED ORDER — GUAIFENESIN-DM 100-10 MG/5ML PO SYRP: 5.0000 mL | ORAL_SOLUTION | ORAL | Status: DC | PRN

## 2015-04-06 MED ORDER — LEVOFLOXACIN 750 MG PO TABS: 750.0000 mg | ORAL_TABLET | Freq: Every day | ORAL | Status: DC

## 2015-04-06 MED ORDER — PREDNISONE 10 MG PO TABS: ORAL_TABLET | ORAL | Status: DC

## 2015-04-06 MED ORDER — POTASSIUM CHLORIDE CRYS ER 20 MEQ PO TBCR
40.0000 meq | EXTENDED_RELEASE_TABLET | ORAL | Status: AC
  Administered 2015-04-06 (×2): 40 meq via ORAL
  Filled 2015-04-06 (×2): qty 2

## 2015-04-06 NOTE — Care Management Note (Signed)
Case Management Note  Patient Details  Name: Lindsey Combs MRN: [id number] Date of Birth: [birth year]  Subjective/Objective:                    Action/Plan:   Expected Discharge Date:                  Expected Discharge Plan:  Home/Self Care  In-House Referral:  NA  Discharge planning Services  CM Consult  Post Acute Care Choice:  Durable Medical Equipment Choice offered to:  Patient  DME Arranged:  Oxygen DME Agency:  [facility]  HH Arranged:    [place] Agency:     Status of Service:  Completed, signed off  Medicare Important Message Given:  Yes-second notification given Date Medicare IM Given:    Medicare IM give by:    Date Additional Medicare IM Given:    Additional Medicare Important Message give by:     If discussed at [place] of Stay Meetings, dates discussed:    Additional Comments: Pt qualifies for home O2. Pt would like [facility] to provide home O2. Orders faxed to [facility] and they will arrange portable O2 to be delivered to pts room prior to discharge and other home O2 DME to be delivered to pts home after discharge. Pt and pts nurse aware of discharge arrangements. Christinia Gully [place], RN 04/06/2015, 2:00 PM

## 2015-04-06 NOTE — Progress Notes (Signed)
SATURATION QUALIFICATIONS: (This note is used to comply with regulatory documentation for home oxygen)  Patient Saturations on Room Air at Rest = 88%  Patient Saturations on Room Air while Ambulating = 78%  Patient Saturations on 2 Liters of oxygen while Ambulating = 92%  Please briefly explain why patient needs home oxygen: pt needs home oxygen at this time because O2 stats are not meeting safety criteria for home discharge at this time

## 2015-04-06 NOTE — Evaluation (Signed)
Physical Therapy Evaluation Patient Details Name: Lindsey Combs MRN: [id number] DOB: 05/22/1946 Today's Date: 04/06/2015   History of Present Illness  Pt is a 69 year old female admitted with acute respiratory failure secondary to COPD.  She has a history of degenerative disc disease, HTN, peripheral neuropathy.  She lives alone with family nearby.  She is normally independent with all ADLs.  Clinical Impression  Pt was seen for evaluation.  She was sleepy but cooperative, on 2 L O2 with an O2 sat=95% at rest.  Her strength and balance were WNL and she was independent in transfers.  She was taken off of supplemental O2 for gait eval.  Her gait with no assistive device was WNL but O2 sat dropped to 80%.  It recovered to 90% with resumption of supplemental O2 at 2 L/min within 2 minute.  She should not need any further PT at home.  I have recommended that she slowly increase her activity level at home and her daughter will plan to stay with her initially upon d/c.    Follow Up Recommendations No PT follow up    Equipment Recommendations  None recommended by PT    Recommendations for Other Services   none    Precautions / Restrictions Precautions Precautions: None Restrictions Weight Bearing Restrictions: No      Mobility  Bed Mobility Overal bed mobility: Independent                Transfers Overall transfer level: Independent                  Ambulation/Gait Ambulation/Gait assistance: Independent Ambulation Distance (Feet): 150 Feet Assistive device: None Gait Pattern/deviations: WFL(Within Functional Limits)   Gait velocity interpretation: at or above normal speed for age/gender    Stairs            Wheelchair Mobility    Modified Rankin (Stroke Patients Only)       Balance Overall balance assessment: Independent                                           Pertinent Vitals/Pain Pain Assessment: No/denies pain    Home  Living Family/patient expects to be discharged to:: Private residence Living Arrangements: Alone Available Help at Discharge: Family;Available PRN/intermittently Type of Home: Apartment Home Access: Level entry     Home Layout: One level Home Equipment: None      Prior Function Level of Independence: Independent               Hand Dominance        Extremity/Trunk Assessment               Lower Extremity Assessment: Overall WFL for tasks assessed (mildly deconditioned from baseline)      Cervical / Trunk Assessment: Kyphotic  Communication   Communication: No difficulties  Cognition Arousal/Alertness: Awake/alert Behavior During Therapy: WFL for tasks assessed/performed Overall Cognitive Status: Within Functional Limits for tasks assessed                      General Comments      Exercises        Assessment/Plan    PT Assessment Patent does not need any further PT services  PT Diagnosis     PT Problem List    PT Treatment Interventions     PT Goals (  Current goals can be found in the Care Plan section) Acute Rehab PT Goals PT Goal Formulation: All assessment and education complete, DC therapy    Frequency     Barriers to discharge  none      Co-evaluation               End of Session Equipment Utilized During Treatment: Gait belt Activity Tolerance: Patient tolerated treatment well Patient left: in bed;with call bell/phone within reach           Time: 2417-5301 PT Time Calculation (min) (ACUTE ONLY): 15 min   Charges:   PT Evaluation $Initial PT Evaluation Tier I: 1 Procedure     PT G CodesOwens Shark, [name]  PT 04/06/2015, 10:10 AM [phone]

## 2015-04-06 NOTE — Progress Notes (Addendum)
Discharge instructions reviewed with patient, daughter at bedside. Verbalized understanding of instructions. Given copy of AVS and prescriptions. Home O2 present for discharge, pt demonstrates correct use. Discussed importance of no smoking and no family/visitors smoking in home while with oxygen. Verbalized understanding. Pt left floor in stable condition via w/c accompanied by nurse tech. Donavan Foil, RN Home medications returned to patient from pharmacy prior to discharge. Donavan Foil, RN

## 2015-04-06 NOTE — Care Management Note (Signed)
Case Management Note  Patient Details  Name: Lindsey Combs MRN: [id number] Date of Birth: 04-03-1946  Subjective/Objective:                  Pt admitted from home with respiratory failure. Pt lives alone and has a very supportive family support. Pt is fairly independent with ADL's. Pt has a neb machine.  Action/Plan: Will need home O2 assessment prior to discharge. If pt qualifies for home O2 pt would like this arranged with [employer].  Expected Discharge Date:                  Expected Discharge Plan:  Home/Self Care  In-House Referral:  NA  Discharge planning Services  CM Consult  Post Acute Care Choice:    Choice offered to:     DME Arranged:    DME Agency:     HH Arranged:    HH Agency:     Status of Service:  In process, will continue to follow  Medicare Important Message Given:    Date Medicare IM Given:    Medicare IM give by:    Date Additional Medicare IM Given:    Additional Medicare Important Message give by:     If discussed at [place] of Stay Meetings, dates discussed:    Additional Comments:  Joylene Draft, RN 04/06/2015, 11:37 AM

## 2015-04-06 NOTE — Progress Notes (Signed)
Pt asked for a black bag which contained medication brought from home. It was explained to pt that she cannot take these medication and that they would be sent to pharmacy and given to her when she gets ready to go home. Will continue to monitor.

## 2015-04-06 NOTE — Care Management (Signed)
iImportant Message  Patient Details  Name: Lindsey Combs MRN: [id number] Date of Birth: [birth year]   Medicare Important Message Given:  Yes-second notification given    Joylene Draft, RN 04/06/2015, 12:26 PM

## 2015-04-06 NOTE — Discharge Summary (Signed)
Physician Discharge Summary  Lindsey Combs IHK:[id number] DOB: [birth year] DOA: 04/03/2015  PCP: Robert Bellow, MD  Admit date: 04/03/2015 Discharge date: 04/06/2015  Time spent: 40 minutes  Recommendations for Outpatient Follow-up:  1. Follow up with PCP dr Karie Kirks 5-7 days for evaluation of respiratory status and need for continued oxygen. May benefit PFT to establish baseline. Recommend BMET track potassium 2. Home oxygen  Discharge Diagnoses:  Principal Problem:   Acute respiratory failure with hypoxia Active Problems:   Anemia, iron deficiency   GERD (gastroesophageal reflux disease)   Back pain, chronic   Anxiety   Peripheral neuropathy   COPD exacerbation   Hyponatremia   Dehydration   Tobacco dependence   Hyperglycemia   Discharge Condition: stable  Diet recommendation: heart healthy  Filed Weights   04/03/15 1003 04/03/15 1710  Weight: 52.617 kg (116 lb) 53.615 kg (118 lb 3.2 oz)    History of present illness:  Lindsey Combs is a pleasant 69 y.o. female with a past medical history that includes COPD not on home oxygen or home nebulizers, tobacco use, hypertension, chronic pain related to degenerative disc disease, GERD, peripheral neuropathy presented to the emergency department on 04/03/15 with the chief complaint of worsening shortness of breath. Initial evaluation in the emergency department revealed acute respiratory failure with hypoxia on ambulation and dehydration.  Patient reported that 2 [place] and afterwards developed worsening shortness of breath. She used her rescue inhaler which she reported she typically uses 1 or 2 times a day without relief. In the course of the next few days she developed productive cough with white thick sputum, chills, nausea with no vomiting and intermittent diarrhea. In addition she developed bilateral anterior chest pain worse with coughing. She denied lower extremity edema or  orthopnea, headache visual disturbances syncope or near-syncope. During this time she went to her primary care provider was provided with steoid injection as well as antibiotic injection. There was little improvement. 2 days prior to presentation she went back to her primary care provider was provided with more steroids and oral anabiotic's as well as nebulizers.  Workup in the emergency department significant for sodium of 125, chloride 87. Initial troponin was negative. Serum glucose 141. Chest x-ray with hyperinflation no infiltrate. EKG with sinus tachycardia bilateral enlargement similar to the tracing in 2012. While in the emergency department she was afebrile hemodynamically stable and hypoxic with oxygen saturation level 70% with ambulation on room air. She was provided with nebulizer treatment 80 mg of Solu-Medrol and oxygen supplementation. At the time my exam oxygen saturation level 98% on 2 L  Hospital Course:  1. Acute hypoxic respiratory failure secondary to COPD. Provided with steroids, antibiotics, flutter valve and nebs. She slowly  improved. At discharge she remains hypoxic on room air (88%) and therefore will be discharged with home oxygen 2. Acute COPD exacerbation. See above. Will discharge with 3 days levaqin to complete 7 day course and slow prednisone taper. Recommend follow up with PCP 5-7 days for evaluation of respiratory status and need for continued oxygen.  3. Hyponatremia. much improved at discharge after IV fluids and free water restiction. Asymptomatic. Likely related to dehydration.  4. Atypical chest pain. Troponins negative. EKG nonacute. No further evaluation suggested. 5. Hyperglycemia. Hemoglobin A1c within normal limits. Modest on steroids. No further evaluation. 6. Tobacco dependence. Recommend cessation.  Procedures:  none  Consultations:  none  Discharge Exam: Filed Vitals:   04/06/15  1104  BP:   Pulse: 101  Temp:   Resp:     General: well  nourished appears comfortable Cardiovascular: RRR no MGR No LE edema Respiratory: mild increased work of breathing with conversation. BS diminished particularly in bases. Diffuse rhonchi and faint end expiratory wheeze  Discharge Instructions   Discharge Instructions    Diet - low sodium heart healthy    Complete by:  As directed      Discharge instructions    Complete by:  As directed   Take medication as directed  Follow up with PCP as scheduled  Stop smoking     Increase activity slowly    Complete by:  As directed           Current Discharge Medication List    START taking these medications   Details  guaiFENesin-dextromethorphan (ROBITUSSIN DM) 100-10 MG/5ML syrup Take 5 mLs by mouth every 4 (four) hours as needed for cough. Qty: 118 mL, Refills: 0    HYDROcodone-acetaminophen (NORCO/VICODIN) 5-325 MG per tablet Take 1 tablet by mouth every 6 (six) hours as needed for moderate pain. Qty: 12 tablet, Refills: 0    levofloxacin (LEVAQUIN) 750 MG tablet Take 1 tablet (750 mg total) by mouth daily. Qty: 3 tablet, Refills: 0    nicotine (NICODERM CQ - DOSED IN MG/24 HOURS) 21 mg/24hr patch Place 1 patch (21 mg total) onto the skin daily. Qty: 28 patch, Refills: 0      CONTINUE these medications which have CHANGED   Details  albuterol (PROAIR HFA) 108 (90 BASE) MCG/ACT inhaler Inhale 2 puffs into the lungs every 4 (four) hours as needed for wheezing or shortness of breath. Qty: 1 Inhaler, Refills: 1    predniSONE (DELTASONE) 10 MG tablet Take 4 tabs for 3 days starting 04/07/15 then take 3 tabs for 3 days then take 2 tabs for 3 days then take 1 tab for 3 days then stop. Qty: 30 tablet, Refills: 0      CONTINUE these medications which have NOT CHANGED   Details  acetaminophen (TYLENOL) 500 MG tablet Take 500 mg by mouth every 8 (eight) hours as needed for mild pain.     atorvastatin (LIPITOR) 20 MG tablet Take 20 mg by mouth daily.    ipratropium-albuterol (DUONEB)  0.5-2.5 (3) MG/3ML SOLN Inhale 3 mLs into the lungs every 6 (six) hours as needed.    LORazepam (ATIVAN) 1 MG tablet Take 1 mg by mouth 3 (three) times daily as needed for anxiety.    omeprazole (PRILOSEC) 20 MG capsule Take 20 mg by mouth daily.      STOP taking these medications     azithromycin (ZITHROMAX Z-PAK) 250 MG tablet        Allergies  Allergen Reactions  . Chantix [Varenicline] Other (See Comments)    Mental status changes  . Codeine Itching   Follow-up Information    Follow up with Robert Bellow, MD On 04/21/2015.   Specialty:  Family Medicine   Why:  at 10:30 am   Contact information:   [facility] [address] [phone]        The results of significant diagnostics from this hospitalization (including imaging, microbiology, ancillary and laboratory) are listed below for reference.    Significant Diagnostic Studies: Dg Chest 2 View  04/03/2015   CLINICAL DATA:  Two week history of wheezing and congestion  EXAM: CHEST  2 VIEW  COMPARISON:  July 10, 2013  FINDINGS: Lungs remain somewhat  hyperexpanded. There is no edema or consolidation. Heart size and pulmonary vascularity within normal limits. No adenopathy. No bone lesions.  IMPRESSION: Lungs hyperexpanded.  No edema or consolidation.   Electronically Signed   By: Lowella Grip III M.D.   On: 04/03/2015 11:38    Microbiology: No results found for this or any previous visit (from the past 240 hour(s)).   Labs: Basic Metabolic Panel:  Recent Labs Lab 04/03/15 1107 04/04/15 0624 04/05/15 0610 04/06/15 0609  NA 125* 124* 128* 134*  K 4.1 3.8 3.6 3.3*  CL 87* 86* 88* 94*  CO2 '28 27 30 '$ 33*  GLUCOSE 141* 134* 123* 109*  BUN '7 10 8 7  '$ CREATININE 0.65 0.68 0.69 0.57  CALCIUM 9.4 9.0 9.5 8.7*   Liver Function Tests: No results for input(s): AST, ALT, ALKPHOS, BILITOT, PROT, ALBUMIN in the last 168 hours. No results for input(s): LIPASE, AMYLASE in the last 168 hours. No  results for input(s): AMMONIA in the last 168 hours. CBC:  Recent Labs Lab 04/03/15 1107  WBC 9.5  NEUTROABS 8.2*  HGB 14.2  HCT 41.0  MCV 86.3  PLT 308   Cardiac Enzymes:  Recent Labs Lab 04/03/15 1107 04/03/15 1700 04/04/15 0624  TROPONINI <0.03 <0.03 <0.03   BNP: BNP (last 3 results) No results for input(s): BNP in the last 8760 hours.  ProBNP (last 3 results) No results for input(s): PROBNP in the last 8760 hours.  CBG:  Recent Labs Lab 04/05/15 1138 04/05/15 1623 04/05/15 2202 04/06/15 0725 04/06/15 1126  GLUCAP 108* 144* 124* 106* 115*       Signed:  Daevon Holdren M  [facility] 04/06/2015, 2:02 PM

## 2016-02-04 ENCOUNTER — Ambulatory Visit (INDEPENDENT_AMBULATORY_CARE_PROVIDER_SITE_OTHER): Payer: Medicare Other | Admitting: Otolaryngology

## 2016-02-04 DIAGNOSIS — R070 Pain in throat: Secondary | ICD-10-CM

## 2016-02-04 DIAGNOSIS — K219 Gastro-esophageal reflux disease without esophagitis: Secondary | ICD-10-CM

## 2016-02-11 ENCOUNTER — Other Ambulatory Visit (HOSPITAL_COMMUNITY): Payer: Self-pay | Admitting: Internal Medicine

## 2016-02-11 DIAGNOSIS — Z1231 Encounter for screening mammogram for malignant neoplasm of breast: Secondary | ICD-10-CM

## 2016-02-17 ENCOUNTER — Ambulatory Visit (HOSPITAL_COMMUNITY)
Admission: RE | Admit: 2016-02-17 | Discharge: 2016-02-17 | Disposition: A | Discharge status: Home / Self Care | Payer: Medicare Other | Source: Ambulatory Visit | Attending: Internal Medicine | Admitting: Internal Medicine

## 2016-02-17 DIAGNOSIS — Z1231 Encounter for screening mammogram for malignant neoplasm of breast: Secondary | ICD-10-CM | POA: Diagnosis present

## 2016-02-19 ENCOUNTER — Other Ambulatory Visit: Payer: Self-pay | Admitting: Internal Medicine

## 2016-02-19 DIAGNOSIS — R928 Other abnormal and inconclusive findings on diagnostic imaging of breast: Secondary | ICD-10-CM

## 2016-02-24 ENCOUNTER — Other Ambulatory Visit (HOSPITAL_COMMUNITY): Payer: Self-pay | Admitting: Internal Medicine

## 2016-02-24 DIAGNOSIS — R928 Other abnormal and inconclusive findings on diagnostic imaging of breast: Secondary | ICD-10-CM

## 2016-03-01 ENCOUNTER — Ambulatory Visit (HOSPITAL_COMMUNITY)
Admission: RE | Admit: 2016-03-01 | Discharge: 2016-03-01 | Disposition: A | Discharge status: Home / Self Care | Payer: Medicare Other | Source: Ambulatory Visit | Attending: Internal Medicine | Admitting: Internal Medicine

## 2016-03-01 DIAGNOSIS — R928 Other abnormal and inconclusive findings on diagnostic imaging of breast: Secondary | ICD-10-CM

## 2016-03-01 DIAGNOSIS — N63 Unspecified lump in breast: Secondary | ICD-10-CM | POA: Insufficient documentation

## 2016-03-24 ENCOUNTER — Ambulatory Visit (INDEPENDENT_AMBULATORY_CARE_PROVIDER_SITE_OTHER): Payer: Medicare Other | Admitting: Otolaryngology

## 2016-03-24 DIAGNOSIS — K219 Gastro-esophageal reflux disease without esophagitis: Secondary | ICD-10-CM | POA: Diagnosis not present

## 2016-03-24 DIAGNOSIS — R070 Pain in throat: Secondary | ICD-10-CM

## 2016-05-05 ENCOUNTER — Ambulatory Visit (INDEPENDENT_AMBULATORY_CARE_PROVIDER_SITE_OTHER): Payer: Medicare Other | Admitting: Otolaryngology

## 2016-05-05 ENCOUNTER — Other Ambulatory Visit: Payer: Self-pay | Admitting: Otolaryngology

## 2016-05-05 DIAGNOSIS — R1312 Dysphagia, oropharyngeal phase: Secondary | ICD-10-CM | POA: Diagnosis not present

## 2016-05-05 DIAGNOSIS — D3705 Neoplasm of uncertain behavior of pharynx: Secondary | ICD-10-CM

## 2016-05-31 ENCOUNTER — Encounter (HOSPITAL_COMMUNITY): Payer: Self-pay

## 2016-05-31 NOTE — Pre-Procedure Instructions (Addendum)
CALEDONIA ZOU  05/31/2016      [facility] - [place], [place] [address] Phone: [phone] Fax: [phone]    Your procedure is scheduled on Wednesday September 13.  Report to [facility] Admitting at 7:30 A.M.  Call this number if you have problems the morning of surgery:  [phone]   Remember:  Do not eat food or drink liquids after midnight.  Take these medicines the morning of surgery with A SIP OF WATER: acetaminophen (tylenol) if needed, lorazepam (ativan) if needed, SPIRIVA, Albuterol (PROAIR) if needed, please bring inhaler to hospital  7 days prior to surgery STOP taking any Aspirin, Aleve, Naproxen, Ibuprofen, Motrin, Advil, Goody's, BC's, all herbal medications, fish oil, and all vitamins    Do not wear jewelry, make-up or nail polish.  Do not wear lotions, powders, or perfumes, or deoderant.  Do not shave 48 hours prior to surgery.  Men may shave face and neck.  Do not bring valuables to the hospital.  [facility] is not responsible for any belongings or valuables.  Contacts, dentures or bridgework may not be worn into surgery.  Leave your suitcase in the car.  After surgery it may be brought to your room.  For patients admitted to the hospital, discharge time will be determined by your treatment team.  Patients discharged the day of surgery will not be allowed to drive home.   Special instructions:    [place]- Preparing For Surgery  Before surgery, you can play an important role. Because skin is not sterile, your skin needs to be as free of germs as possible. You can reduce the number of germs on your skin by washing with CHG (chlorahexidine gluconate) Soap before surgery.  CHG is an antiseptic cleaner which kills germs and bonds with the skin to continue killing germs even after washing.  Please do not use if you have an allergy to CHG or antibacterial soaps. If your skin becomes  reddened/irritated stop using the CHG.  Do not shave (including legs and underarms) for at least 48 hours prior to first CHG shower. It is OK to shave your face.  Please follow these instructions carefully.   1. Shower the NIGHT BEFORE SURGERY and the MORNING OF SURGERY with CHG.   2. If you chose to wash your hair, wash your hair first as usual with your normal shampoo.  3. After you shampoo, rinse your hair and body thoroughly to remove the shampoo.  4. Use CHG as you would any other liquid soap. You can apply CHG directly to the skin and wash gently with a scrungie or a clean washcloth.   5. Apply the CHG Soap to your body ONLY FROM THE NECK DOWN.  Do not use on open wounds or open sores. Avoid contact with your eyes, ears, mouth and genitals (private parts). Wash genitals (private parts) with your normal soap.  6. Wash thoroughly, paying special attention to the area where your surgery will be performed.  7. Thoroughly rinse your body with warm water from the neck down.  8. DO NOT shower/wash with your normal soap after using and rinsing off the CHG Soap.  9. Pat yourself dry with a CLEAN TOWEL.   10. Wear CLEAN PAJAMAS   11. Place CLEAN SHEETS on your bed the night of your first shower and DO NOT SLEEP WITH PETS.    Day of Surgery: Do not apply any  deodorants/lotions. Please wear clean clothes to the hospital/surgery center.

## 2016-06-01 ENCOUNTER — Encounter (HOSPITAL_COMMUNITY): Payer: Self-pay

## 2016-06-01 ENCOUNTER — Encounter (HOSPITAL_COMMUNITY)
Admission: RE | Admit: 2016-06-01 | Discharge: 2016-06-01 | Disposition: A | Discharge status: Home / Self Care | Payer: Medicare Other | Source: Ambulatory Visit | Attending: Otolaryngology | Admitting: Otolaryngology

## 2016-06-01 DIAGNOSIS — I4510 Unspecified right bundle-branch block: Secondary | ICD-10-CM | POA: Diagnosis not present

## 2016-06-01 DIAGNOSIS — Z01818 Encounter for other preprocedural examination: Secondary | ICD-10-CM | POA: Insufficient documentation

## 2016-06-01 DIAGNOSIS — Z01812 Encounter for preprocedural laboratory examination: Secondary | ICD-10-CM | POA: Insufficient documentation

## 2016-06-01 DIAGNOSIS — Z01810 Encounter for preprocedural cardiovascular examination: Secondary | ICD-10-CM | POA: Insufficient documentation

## 2016-06-01 DIAGNOSIS — K1379 Other lesions of oral mucosa: Secondary | ICD-10-CM | POA: Insufficient documentation

## 2016-06-01 HISTORY — DX: Personal history of urinary (tract) infections: Z87.440

## 2016-06-01 LAB — CBC
HCT: 38 % (ref 36.0–46.0)
Hemoglobin: 12.8 g/dL (ref 12.0–15.0)
MCH: 30 pg (ref 26.0–34.0)
MCHC: 33.7 g/dL (ref 30.0–36.0)
MCV: 89.2 fL (ref 78.0–100.0)
PLATELETS: 344 10*3/uL (ref 150–400)
RBC: 4.26 MIL/uL (ref 3.87–5.11)
RDW: 14.2 % (ref 11.5–15.5)
WBC: 9.7 10*3/uL (ref 4.0–10.5)

## 2016-06-01 LAB — BASIC METABOLIC PANEL
Anion gap: 9 (ref 5–15)
BUN: 6 mg/dL (ref 6–20)
CALCIUM: 10.1 mg/dL (ref 8.9–10.3)
CO2: 29 mmol/L (ref 22–32)
CREATININE: 0.84 mg/dL (ref 0.44–1.00)
Chloride: 91 mmol/L — ABNORMAL LOW (ref 101–111)
Glucose, Bld: 124 mg/dL — ABNORMAL HIGH (ref 65–99)
Potassium: 3.4 mmol/L — ABNORMAL LOW (ref 3.5–5.1)
SODIUM: 129 mmol/L — AB (ref 135–145)

## 2016-06-01 NOTE — Progress Notes (Signed)
PCP: Dr. [name] No cardiologist or cardiac workup per pt.  No complaints of chest pain, SOB, signs of infection at this time.   EKG: 06/01/16

## 2016-06-02 NOTE — Progress Notes (Signed)
Anesthesia Chart Review: Patient is a 70 year old female scheduled for direct laryngoscopy and biopsy on 06/08/16 by Dr. Benjamine Mola. DX: Uvular lesion.  History includes smoking, HTN, COPD, hypercholesterolemia, anxiety, GERD, peripheral neuropathy, exertional dyspnea, hyponatremia. Admission for acuate hypoxic respiratory failure secondary to COPD exacerbation 03/2015. Also had hyponatremia responsive to IVF and free water restriction. PCP is listed as Dr. [name].  Meds include albuterol, HCTZ, Lipitor, Ativan, Zantac, Spiriva.  BP (!) 147/60   Pulse 83   Temp 36.5 C   Resp 18   Ht 5' 3.5" (1.613 m)   Wt 106 lb 3.2 oz (48.2 kg)   SpO2 98%   BMI 18.52 kg/m  She denied CP, SOB, and signs of infection at her PAT visit.  06/01/16 EKG: NSR, incomplete right BBB. Since last tracing (04/03/15) rate is slower.  Preoperative labs noted. Na 129 (history of hyponatremia last year; HCTZ may also be contributing), K 3.4, Cr 0.84, glucose 124. CBC WNL. BMET routed to Dr. Maudie Mercury via [place]. Will order an ISTAT for the morning of surgery to re-evaluate for hyponatremia and hypokalemia. If labs are stable and otherwise no acute changes then I would anticipate that she can proceed as planned.  George Hugh [facility] Short Stay Center/Anesthesiology Phone [phone] 06/02/2016 4:49 PM

## 2016-06-07 ENCOUNTER — Other Ambulatory Visit: Payer: Self-pay | Admitting: Otolaryngology

## 2016-06-07 NOTE — Progress Notes (Signed)
Spoke with patient who stated she was already informed by office to arrive at 630 am on 06/08/16.

## 2016-06-08 ENCOUNTER — Encounter (HOSPITAL_COMMUNITY)
Admission: RE | Disposition: A | Discharge status: Home / Self Care | Payer: Self-pay | Source: Ambulatory Visit | Attending: Otolaryngology

## 2016-06-08 ENCOUNTER — Ambulatory Visit (HOSPITAL_COMMUNITY): Payer: Medicare Other | Admitting: Vascular Surgery

## 2016-06-08 ENCOUNTER — Encounter (HOSPITAL_COMMUNITY): Payer: Self-pay | Admitting: Anesthesiology

## 2016-06-08 ENCOUNTER — Ambulatory Visit (HOSPITAL_COMMUNITY)
Admission: RE | Admit: 2016-06-08 | Discharge: 2016-06-08 | Disposition: A | Discharge status: Home / Self Care | Payer: Medicare Other | Source: Ambulatory Visit | Attending: Otolaryngology | Admitting: Otolaryngology

## 2016-06-08 DIAGNOSIS — J449 Chronic obstructive pulmonary disease, unspecified: Secondary | ICD-10-CM | POA: Diagnosis not present

## 2016-06-08 DIAGNOSIS — C052 Malignant neoplasm of uvula: Secondary | ICD-10-CM | POA: Insufficient documentation

## 2016-06-08 DIAGNOSIS — M1990 Unspecified osteoarthritis, unspecified site: Secondary | ICD-10-CM | POA: Diagnosis not present

## 2016-06-08 DIAGNOSIS — K1329 Other disturbances of oral epithelium, including tongue: Secondary | ICD-10-CM | POA: Insufficient documentation

## 2016-06-08 DIAGNOSIS — I10 Essential (primary) hypertension: Secondary | ICD-10-CM | POA: Insufficient documentation

## 2016-06-08 DIAGNOSIS — D3705 Neoplasm of uncertain behavior of pharynx: Secondary | ICD-10-CM | POA: Diagnosis not present

## 2016-06-08 HISTORY — PX: DIRECT LARYNGOSCOPY: SHX5326

## 2016-06-08 LAB — POCT I-STAT 4, (NA,K, GLUC, HGB,HCT)
GLUCOSE: 105 mg/dL — AB (ref 65–99)
HCT: 37 % (ref 36.0–46.0)
Hemoglobin: 12.6 g/dL (ref 12.0–15.0)
POTASSIUM: 4.1 mmol/L (ref 3.5–5.1)
SODIUM: 133 mmol/L — AB (ref 135–145)

## 2016-06-08 SURGERY — LARYNGOSCOPY, DIRECT
Anesthesia: General | Site: Mouth

## 2016-06-08 MED ORDER — ALBUTEROL SULFATE (2.5 MG/3ML) 0.083% IN NEBU
INHALATION_SOLUTION | RESPIRATORY_TRACT | Status: AC
  Filled 2016-06-08: qty 3

## 2016-06-08 MED ORDER — LIDOCAINE HCL (CARDIAC) 20 MG/ML IV SOLN
INTRAVENOUS | Status: DC | PRN
  Administered 2016-06-08: 100 mg via INTRATRACHEAL
  Administered 2016-06-08: 60 mg via INTRAVENOUS

## 2016-06-08 MED ORDER — PROPOFOL 10 MG/ML IV BOLUS
INTRAVENOUS | Status: AC
  Filled 2016-06-08: qty 20

## 2016-06-08 MED ORDER — ONDANSETRON HCL 4 MG/2ML IJ SOLN: 4.0000 mg | Freq: Once | INTRAMUSCULAR | Status: DC | PRN

## 2016-06-08 MED ORDER — LACTATED RINGERS IV SOLN
INTRAVENOUS | Status: DC | PRN
  Administered 2016-06-08: 08:00:00 via INTRAVENOUS

## 2016-06-08 MED ORDER — FENTANYL CITRATE (PF) 100 MCG/2ML IJ SOLN
INTRAMUSCULAR | Status: DC | PRN
  Administered 2016-06-08: 25 ug via INTRAVENOUS

## 2016-06-08 MED ORDER — ONDANSETRON HCL 4 MG/2ML IJ SOLN
INTRAMUSCULAR | Status: DC | PRN
  Administered 2016-06-08: 4 mg via INTRAVENOUS

## 2016-06-08 MED ORDER — MIDAZOLAM HCL 2 MG/2ML IJ SOLN
INTRAMUSCULAR | Status: AC
  Filled 2016-06-08: qty 2

## 2016-06-08 MED ORDER — AMOXICILLIN 875 MG PO TABS: 875.0000 mg | ORAL_TABLET | Freq: Two times a day (BID) | ORAL | 0 refills | Status: DC

## 2016-06-08 MED ORDER — HYDROCODONE-ACETAMINOPHEN 5-325 MG PO TABS: 1.0000 | ORAL_TABLET | ORAL | 0 refills | Status: DC | PRN

## 2016-06-08 MED ORDER — PROPOFOL 10 MG/ML IV BOLUS
INTRAVENOUS | Status: DC | PRN
  Administered 2016-06-08: 130 mg via INTRAVENOUS

## 2016-06-08 MED ORDER — OXYMETAZOLINE HCL 0.05 % NA SOLN
NASAL | Status: AC
  Filled 2016-06-08: qty 15

## 2016-06-08 MED ORDER — ROCURONIUM BROMIDE 10 MG/ML (PF) SYRINGE
PREFILLED_SYRINGE | INTRAVENOUS | Status: AC
  Filled 2016-06-08: qty 10

## 2016-06-08 MED ORDER — SUGAMMADEX SODIUM 200 MG/2ML IV SOLN
INTRAVENOUS | Status: AC
  Filled 2016-06-08: qty 2

## 2016-06-08 MED ORDER — 0.9 % SODIUM CHLORIDE (POUR BTL) OPTIME
TOPICAL | Status: DC | PRN
Start: 2016-06-08 — End: 2016-06-08
  Administered 2016-06-08: 1000 mL

## 2016-06-08 MED ORDER — ONDANSETRON HCL 4 MG/2ML IJ SOLN
INTRAMUSCULAR | Status: AC
  Filled 2016-06-08: qty 2

## 2016-06-08 MED ORDER — LIDOCAINE 2% (20 MG/ML) 5 ML SYRINGE
INTRAMUSCULAR | Status: AC
  Filled 2016-06-08: qty 5

## 2016-06-08 MED ORDER — SUCCINYLCHOLINE CHLORIDE 20 MG/ML IJ SOLN
INTRAMUSCULAR | Status: DC | PRN
  Administered 2016-06-08: 80 mg via INTRAVENOUS

## 2016-06-08 MED ORDER — EPINEPHRINE HCL (NASAL) 0.1 % NA SOLN
NASAL | Status: AC
  Filled 2016-06-08: qty 30

## 2016-06-08 MED ORDER — FENTANYL CITRATE (PF) 100 MCG/2ML IJ SOLN: 25.0000 ug | INTRAMUSCULAR | Status: DC | PRN

## 2016-06-08 MED ORDER — SUCCINYLCHOLINE CHLORIDE 200 MG/10ML IV SOSY
PREFILLED_SYRINGE | INTRAVENOUS | Status: AC
  Filled 2016-06-08: qty 10

## 2016-06-08 MED ORDER — PHENYLEPHRINE HCL 10 MG/ML IJ SOLN
INTRAMUSCULAR | Status: DC | PRN
  Administered 2016-06-08: 40 ug via INTRAVENOUS

## 2016-06-08 MED ORDER — CEFAZOLIN SODIUM 1 G IJ SOLR
INTRAMUSCULAR | Status: AC
  Filled 2016-06-08: qty 10

## 2016-06-08 MED ORDER — PHENYLEPHRINE 40 MCG/ML (10ML) SYRINGE FOR IV PUSH (FOR BLOOD PRESSURE SUPPORT)
PREFILLED_SYRINGE | INTRAVENOUS | Status: AC
  Filled 2016-06-08: qty 10

## 2016-06-08 MED ORDER — IPRATROPIUM-ALBUTEROL 0.5-2.5 (3) MG/3ML IN SOLN
RESPIRATORY_TRACT | Status: AC
  Administered 2016-06-08: 3 mL via RESPIRATORY_TRACT
  Filled 2016-06-08: qty 3

## 2016-06-08 MED ORDER — FENTANYL CITRATE (PF) 100 MCG/2ML IJ SOLN
INTRAMUSCULAR | Status: AC
  Filled 2016-06-08: qty 2

## 2016-06-08 MED ORDER — CEFAZOLIN SODIUM 1 G IJ SOLR
INTRAMUSCULAR | Status: DC | PRN
  Administered 2016-06-08: 1 g via INTRAMUSCULAR

## 2016-06-08 SURGICAL SUPPLY — 24 items
CANISTER SUCTION 2500CC (MISCELLANEOUS) ×3 IMPLANT
COAGULATOR SUCT 6 FR SWTCH (ELECTROSURGICAL) ×1
COAGULATOR SUCT SWTCH 10FR 6 (ELECTROSURGICAL) ×2 IMPLANT
CONT SPEC 4OZ CLIKSEAL STRL BL (MISCELLANEOUS) ×6 IMPLANT
COVER TABLE BACK 60X90 (DRAPES) ×3 IMPLANT
DRAPE PROXIMA HALF (DRAPES) ×3 IMPLANT
ELECT COATED BLADE 2.86 ST (ELECTRODE) ×3 IMPLANT
ELECT REM PT RETURN 9FT ADLT (ELECTROSURGICAL) ×3
ELECTRODE REM PT RTRN 9FT ADLT (ELECTROSURGICAL) ×1 IMPLANT
GLOVE BIO SURGEON STRL SZ7.5 (GLOVE) ×3 IMPLANT
GOWN STRL REUS W/ TWL LRG LVL3 (GOWN DISPOSABLE) ×2 IMPLANT
GOWN STRL REUS W/TWL LRG LVL3 (GOWN DISPOSABLE) ×4
GUARD TEETH (MISCELLANEOUS) IMPLANT
KIT BASIN OR (CUSTOM PROCEDURE TRAY) ×3 IMPLANT
KIT ROOM TURNOVER OR (KITS) ×3 IMPLANT
NEEDLE HYPO 25GX1X1/2 BEV (NEEDLE) IMPLANT
NS IRRIG 1000ML POUR BTL (IV SOLUTION) ×3 IMPLANT
PAD ARMBOARD 7.5X6 YLW CONV (MISCELLANEOUS) ×6 IMPLANT
PATTIES SURGICAL .5 X1 (DISPOSABLE) IMPLANT
PENCIL BUTTON HOLSTER BLD 10FT (ELECTRODE) ×3 IMPLANT
SOLUTION ANTI FOG 6CC (MISCELLANEOUS) IMPLANT
SPONGE GAUZE 4X4 12PLY STER LF (GAUZE/BANDAGES/DRESSINGS) ×3 IMPLANT
TUBE CONNECTING 12'X1/4 (SUCTIONS) ×1
TUBE CONNECTING 12X1/4 (SUCTIONS) ×2 IMPLANT

## 2016-06-08 NOTE — H&P (Signed)
Cc: Throat pain  HPI: The patient is a 70 year old female who returns today for her follow-up evaluation.  She was last seen 6 weeks ago.  At that time, she was noted to have severe posterior laryngeal edema.  The patient was placed on ranitidine and omeprazole.  Tobacco cessation was also strongly encouraged.  In addition, the patient also complains of frequent throat pain.  There was an area of leukoplakia and erythroplakia on her uvula.  She was also placed on [employer]. According to the patient, her throat pain has worsened.  She is currently complaining of a globus sensation.  She also complains of persistent hoarseness.  She is still smoking 1 pack per day of cigarettes.  In addition, she also complains of intermittent right otalgia.   Exam: The nasal cavities were decongested and anesthetised with a combination of oxymetazoline and 4% lidocaine solution.  The flexible scope was inserted into the right nasal cavity and advanced towards the nasopharynx.  Visualized mucosa over the turbinates and septum were normal.  The nasopharynx was clear.  Oropharyngeal walls were symmetric and mobile without lesion, mass, or edema.  Hypopharynx was also without  lesion or edema.  Larynx was mobile without lesions.  No lesions or asymmetry in the supraglottic larynx.  Arytenoid mucosa was severely edematous with slight erythema.  True vocal folds were pale yellow and severely edematous but without mass or lesion.  Base of tongue was within normal limits. 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 The patient tolerated the procedure well.    Assessment: 1.  The patient continues to have severe posterior laryngeal edema. 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  2.  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   Plan: 1.  The laryngoscopy findings are reviewed with the patient.  2.  Tobacco cessation is strongly encouraged.   3.  She should  continue with her ranitidine daily.  4.  Diet modifications that could affect her laryngopharyngeal reflux are also discussed.   5.  Due to the persistent leukoplakia and erythroplakia, we will proceed with direct laryngoscopy and biopsy of her uvula.

## 2016-06-08 NOTE — Anesthesia Procedure Notes (Signed)
Procedure Name: Intubation Date/Time: 06/08/2016 8:39 AM Performed by: Jenne Campus Pre-anesthesia Checklist: Patient identified, Emergency Drugs available, Suction available and Patient being monitored Patient Re-evaluated:Patient Re-evaluated prior to inductionOxygen Delivery Method: Circle System Utilized Preoxygenation: Pre-oxygenation with 100% oxygen Intubation Type: IV induction Ventilation: Mask ventilation without difficulty Laryngoscope Size: Miller and 2 Grade View: Grade I Tube type: Oral Tube size: 6.5 mm Number of attempts: 1 Airway Equipment and Method: Stylet,  Oral airway and LTA kit utilized Placement Confirmation: ETT inserted through vocal cords under direct vision,  positive ETCO2 and breath sounds checked- equal and bilateral Secured at: 21 cm Tube secured with: Tape Dental Injury: Teeth and Oropharynx as per pre-operative assessment

## 2016-06-08 NOTE — Anesthesia Postprocedure Evaluation (Signed)
Anesthesia Post Note  Patient: Lindsey Combs  Procedure(s) Performed: Procedure(s) (LRB): DIRECT LARYNGOSCOPY AND BIOPSY (N/A)  Patient location during evaluation: PACU Anesthesia Type: General Level of consciousness: awake and alert Pain management: pain level controlled Vital Signs Assessment: post-procedure vital signs reviewed and stable Respiratory status: spontaneous breathing, nonlabored ventilation, respiratory function stable and patient connected to nasal cannula oxygen Cardiovascular status: blood pressure returned to baseline and stable Postop Assessment: no signs of nausea or vomiting Anesthetic complications: no    Last Vitals:  Vitals:   06/08/16 0945 06/08/16 1000  BP: (!) 144/67 (!) 153/67  Pulse: 78 72  Resp:  12  Temp:  36.5 C    Last Pain:  Vitals:   06/08/16 1000  TempSrc:   PainSc: 1                  Zenaida Deed

## 2016-06-08 NOTE — Anesthesia Preprocedure Evaluation (Addendum)
Anesthesia Evaluation  Patient identified by MRN, date of birth, ID band Patient awake    Reviewed: Allergy & Precautions, H&P , NPO status , Patient's Chart, lab work & pertinent test results  History of Anesthesia Complications Negative for: history of anesthetic complications  Airway Mallampati: II  TM Distance: >3 FB Neck ROM: full    Dental  (+) Edentulous Upper, Edentulous Lower, Dental Advisory Given   Pulmonary shortness of breath, COPD, Current Smoker,    + rhonchi  + decreased breath sounds      Cardiovascular hypertension, Normal cardiovascular exam Rhythm:regular Rate:Normal     Neuro/Psych Anxiety negative neurological ROS     GI/Hepatic negative GI ROS, Neg liver ROS,   Endo/Other  negative endocrine ROS  Renal/GU negative Renal ROS     Musculoskeletal  (+) Arthritis ,   Abdominal   Peds  Hematology negative hematology ROS (+)   Anesthesia Other Findings Significant smoking history, does use albuterol and spiriva, not on oxygen, will give preop duoneb breathing treatment, LTA on intubation  Reproductive/Obstetrics negative OB ROS                           Anesthesia Physical Anesthesia Plan  ASA: III  Anesthesia Plan: General   Post-op Pain Management:    Induction: Intravenous  Airway Management Planned: Oral ETT  Additional Equipment:   Intra-op Plan:   Post-operative Plan: Extubation in OR  Informed Consent: I have reviewed the patients History and Physical, chart, labs and discussed the procedure including the risks, benefits and alternatives for the proposed anesthesia with the patient or authorized representative who has indicated his/her understanding and acceptance.   Dental Advisory Given  Plan Discussed with: Anesthesiologist, CRNA and Surgeon  Anesthesia Plan Comments:         Anesthesia Quick Evaluation

## 2016-06-08 NOTE — Progress Notes (Signed)
Dr judd at bedside aware of bp no rx ordered at this time

## 2016-06-08 NOTE — Discharge Instructions (Signed)
The patient may resume all her previous activities and diet. She will follow-up in my [place] office in one week.

## 2016-06-08 NOTE — Progress Notes (Signed)
Report given to robin roberts rn as Teaching laboratory technician

## 2016-06-08 NOTE — Op Note (Signed)
DATE OF PROCEDURE:  06/08/2016                              OPERATIVE REPORT  SURGEON:  Leta Baptist, MD  PREOPERATIVE DIAGNOSES: 1. Chronic sore throat 2. Erythroplakia And leukoplakia of the uvula  POSTOPERATIVE DIAGNOSES: 1. Chronic sore throat 2. Erythroplakia And leukoplakia of the uvula  PROCEDURE PERFORMED:  Direct laryngoscopy and biopsy  ANESTHESIA:  General endotracheal tube anesthesia.  COMPLICATIONS:  None.  ESTIMATED BLOOD LOSS:  Minimal.  INDICATION FOR PROCEDURE:  Lindsey Combs is a 70 y.o. female with a history of chronic sore throat. The patient has a long history of tobacco use. On examination, she was noted to have persistent leukoplakia and erythroplakia of her uvula. Based on the above findings, the decision was made for the patient to undergo the above-stated procedure. Likelihood of success in reducing symptoms was also discussed.  The risks, benefits, alternatives, and details of the procedure were discussed with the patient.  Questions were invited and answered.  Informed consent was obtained.  DESCRIPTION:  The patient was taken to the operating room and placed supine on the operating table.  General endotracheal tube anesthesia was administered by the anesthesiologist.  The patient was positioned and prepped and draped in a standard fashion for direct laryngoscopy. A Dedo laryngoscope was used for the examination. The laryngoscope was inserted via the oral cavity into the pharynx. The vallecula, epiglottis, aryepiglottic folds, piriform sinuses, and the vocal cords were all normal. No suspicious mass or lesion was noted. Several areas of leukoplakia and erythroplakia were noted on her uvula. The suspicious areas were biopsied. An additional area of leukoplakia was noted on her left tonsillar fossa. The area was also biopsied.  The care of the patient was turned over to the anesthesiologist.  The patient was awakened from anesthesia without difficulty.  The patient was  extubated and transferred to the recovery room in good condition.  OPERATIVE FINDINGS:  Leukoplakia and erythroplakia of the uvula. A small area of leukoplakia was also noted on the left tonsillar fossa.  SPECIMEN:  Biopsy specimens from the uvula and the left tonsillar fossa.  FOLLOWUP CARE:  The patient will be discharged home once awake and alert. The patient will follow up in my office in approximately 1 week.  Nicloe Frontera,SUI W 06/08/2016 9:21 AM

## 2016-06-08 NOTE — Transfer of Care (Signed)
Immediate Anesthesia Transfer of Care Note  Patient: Lindsey Combs  Procedure(s) Performed: Procedure(s): DIRECT LARYNGOSCOPY AND BIOPSY (N/A)  Patient Location: PACU  Anesthesia Type:General  Level of Consciousness: awake, oriented and patient cooperative  Airway & Oxygen Therapy: Patient Spontanous Breathing and Patient connected to nasal cannula oxygen  Post-op Assessment: Report given to RN and Post -op Vital signs reviewed and stable  Post vital signs: Reviewed  Last Vitals:  Vitals:   06/08/16 0644 06/08/16 0912  BP: (!) 175/57 (!) (P) 184/75  Pulse: 77 (P) 84  Resp: 18   Temp: 36.7 C (P) 36.5 C    Last Pain:  Vitals:   06/08/16 0735  TempSrc:   PainSc: 0-No pain      Patients Stated Pain Goal: 3 (01/77/93 9030)  Complications: No apparent anesthesia complications

## 2016-06-09 ENCOUNTER — Encounter (HOSPITAL_COMMUNITY): Payer: Self-pay | Admitting: Otolaryngology

## 2016-06-16 ENCOUNTER — Ambulatory Visit (INDEPENDENT_AMBULATORY_CARE_PROVIDER_SITE_OTHER): Payer: Medicare Other | Admitting: Otolaryngology

## 2016-06-16 DIAGNOSIS — C051 Malignant neoplasm of soft palate: Secondary | ICD-10-CM

## 2016-06-27 ENCOUNTER — Encounter (HOSPITAL_COMMUNITY): Payer: Medicare Other | Attending: Hematology & Oncology | Admitting: Hematology & Oncology

## 2016-06-27 ENCOUNTER — Encounter (HOSPITAL_COMMUNITY): Payer: Self-pay | Admitting: Hematology & Oncology

## 2016-06-27 VITALS — BP 156/58 | HR 93 | Temp 98.7°F | Resp 18 | Ht 62.75 in | Wt 104.6 lb

## 2016-06-27 DIAGNOSIS — R109 Unspecified abdominal pain: Secondary | ICD-10-CM

## 2016-06-27 DIAGNOSIS — Z23 Encounter for immunization: Secondary | ICD-10-CM

## 2016-06-27 DIAGNOSIS — F17200 Nicotine dependence, unspecified, uncomplicated: Secondary | ICD-10-CM

## 2016-06-27 DIAGNOSIS — Z720 Tobacco use: Secondary | ICD-10-CM

## 2016-06-27 DIAGNOSIS — C052 Malignant neoplasm of uvula: Secondary | ICD-10-CM | POA: Diagnosis not present

## 2016-06-27 DIAGNOSIS — R1013 Epigastric pain: Secondary | ICD-10-CM

## 2016-06-27 DIAGNOSIS — R634 Abnormal weight loss: Secondary | ICD-10-CM

## 2016-06-27 DIAGNOSIS — Z800 Family history of malignant neoplasm of digestive organs: Secondary | ICD-10-CM

## 2016-06-27 DIAGNOSIS — B9681 Helicobacter pylori [H. pylori] as the cause of diseases classified elsewhere: Secondary | ICD-10-CM

## 2016-06-27 DIAGNOSIS — Z0000 Encounter for general adult medical examination without abnormal findings: Secondary | ICD-10-CM

## 2016-06-27 DIAGNOSIS — C109 Malignant neoplasm of oropharynx, unspecified: Secondary | ICD-10-CM

## 2016-06-27 MED ORDER — INFLUENZA VAC SPLIT QUAD 0.5 ML IM SUSY
0.5000 mL | PREFILLED_SYRINGE | Freq: Once | INTRAMUSCULAR | Status: AC
  Administered 2016-06-27: 0.5 mL via INTRAMUSCULAR
  Filled 2016-06-27: qty 0.5

## 2016-06-27 NOTE — Progress Notes (Signed)
Lindsey Combs presents today for injection per MD orders. Flu shot administered IM in left Upper Arm. Administration without incident. Patient tolerated well.

## 2016-06-27 NOTE — Progress Notes (Signed)
[facility]  CONSULT NOTE  Patient Care Team: [name], MD as PCP - General (Internal Medicine)  CHIEF COMPLAINTS/PURPOSE OF CONSULTATION:  Squamous cell carcinoma of the uvula, suspicious for invasion Tobacco Abuse  HISTORY OF PRESENTING ILLNESS:  Lindsey Combs 70 y.o. female is here because of newly diagnosed squamous cell carcinoma of the uvula, suspicious for invasion.  Lindsey Combs is a pleasant 70 y.o. who presented with severe sore throat which persisted through antibiotics. She underwent a laryngoscopy on 06/08/2016 with Dr. Benjamine Mola showing severe posterior laryngeal edema. 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 She then underwent a direct laryngoscopy and biopsy of her uvula on 06/08/2016 with Dr. Benjamine Mola. Pathology of the uvula revealed squamous cell carcinoma suspicious for invasion.  Lindsey Combs is accompanied by her youngest daughter. I personally reviewed and went over pathology results with the patient and the need for further work up before making treatment recommendations.   She began getting a sore throat last year when she was going to Dr. Karie Kirks. In January or February she went to a new clinic and was given an antibiotic that cleared it up some.  It eventually returned so she was sent to Dr. Benjamine Mola. Dr. Benjamine Mola initially put her on an antibiotic and "that mouthwash". It was thought she had a yeast infection. When she returned it still had not cleared up so she was set up for a biopsy. After the biopsy she was told it was cancerous. She was told by Dr. Benjamine Mola that she would need chemotherapy. "That's as far as we've got".  She looked at her throat this morning and reports it didn't look as bad as it did.   She reports significant weight loss from 136 lbs to 104 lbs, later stating weight loss of about 30 lbs since last summer. She was diagnosed with H. Pylori. She was  given one prescription for antibiotics that consisted of three separate medications. She states one of the medications she was allergic to. She does not believe this medication regimen worked because she continues to lose weight. She reports abdominal pain with palpation. She has never had imaging performed of her stomach. On chart review her H pylori was diagnosed in 2014 by Dr. Gala Romney on EGD performed on 03/04/2013.   She states, "Nobody's cutting a whole in my throat".  She has received a flu shot this year. She is up to date on mammography. She is supposed to return in November after a "little lump" was found on prior mammogram.   The patient is here for further evaluation and discussion of newly diagnosed squamous cell carcinoma of the uvula (suspicious for invasion)   MEDICAL HISTORY:  Past Medical History:  Diagnosis Date  . Acute respiratory failure with hypoxia ([place])   . Anxiety   . COPD (chronic obstructive pulmonary disease) ([place])   . DDD (degenerative disc disease), lumbar   . Degenerative joint disease of spine   . Diverticulosis   . GERD (gastroesophageal reflux disease)   . H. pylori infection    treated 02/2013.  [name] History of UTI   . Hypercholesterolemia   . Hypertension   . Hyponatremia   . Peripheral neuropathy ([place])   . Shortness of breath    with exertion  . Tobacco abuse     SURGICAL HISTORY: Past Surgical History:  Procedure Laterality Date  . ABDOMINAL EXPLORATION SURGERY  age 39, large ovarian cyst  . BIOPSY N/A 03/01/2013   OFB:PZWC hiatal hernia; otherwise, normal examination/ Status post biopsies as described above. SB bx negative for Celiac. +h/pylori  . CATARACT EXTRACTION W/ INTRAOCULAR LENS  IMPLANT, BILATERAL    . COLONOSCOPY  10/16/09   Jenkins:3 polypsin the sigmoid colon/polyps in the descending colon and the rectum/scattered diverticulum. hyperplastic  . COLONOSCOPY WITH ESOPHAGOGASTRODUODENOSCOPY (EGD) N/A 03/01/2013   HEN:IDPOEUMP colonic  polyps - treated/removed as described above. Colonic diverticulosis. Hyperplastic. Next TCS 02/2018.  [name] DIRECT LARYNGOSCOPY N/A 06/08/2016   Procedure: DIRECT LARYNGOSCOPY AND BIOPSY;  Surgeon: Leta Baptist, MD;  Location: [facility] OR;  Service: ENT;  Laterality: N/A;  . PARTIAL HYSTERECTOMY     age 70    SOCIAL HISTORY: Social History   Social History  . Marital status: Divorced    Spouse name: N/A  . Number of children: 4  . Years of education: N/A   Occupational History  . Not on file.   Social History Main Topics  . Smoking status: Current Every Day Smoker    Packs/day: 1.50    Years: 50.00    Types: Cigarettes  . Smokeless tobacco: Never Used  . Alcohol use No     Comment: glass of wine occasionally  . Drug use: No  . Sexual activity: Not on file   Other Topics Concern  . Not on file   Social History Narrative  . No narrative on file   Divorced. Lives alone in senior housing 4 children; 2 sons and 2 daughters 6 grandchildren 3 great grandchildren Smoker since 24 or 53 yo. 1 ppd ETOH, used to big time, she liked beer. She stopped in 2001 when she started going to church. 1 dog named Eduard Clos, part terrier part [place] She enjoys gardening.  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 Then she took care of her mom and dad until they went in retirement homes in 2008  FAMILY HISTORY: Family History  Problem Relation Age of Onset  . Leukemia Father   . Thyroid disease Father   . Stomach cancer Paternal Grandfather   . Colon cancer Neg Hx    Mother deceased at 42 yo. She had Alzheimer's but she was healthy Father deceased at 21 yo of blood cancer. He had to have a transfusion every 2 months. Then his potassium was too high and they couldn't give him anymore. 2 brothers. Oldest brother had a heart attack earlier this year. Other brother has chronic stomach ulcers  ALLERGIES:  is allergic to chantix [varenicline] and codeine.  MEDICATIONS:  Current  Outpatient Prescriptions  Medication Sig Dispense Refill  . acetaminophen (TYLENOL) 500 MG tablet Take 500 mg by mouth every 6 (six) hours as needed for mild pain.    [name] albuterol (PROAIR HFA) 108 (90 BASE) MCG/ACT inhaler Inhale 2 puffs into the lungs every 4 (four) hours as needed for wheezing or shortness of breath. 1 Inhaler 1  . atorvastatin (LIPITOR) 20 MG tablet Take 20 mg by mouth daily.    . hydrochlorothiazide (MICROZIDE) 12.5 MG capsule Take 12.5 mg by mouth daily.    [name] LORazepam (ATIVAN) 1 MG tablet Take 0.5 mg by mouth 3 (three) times daily as needed for anxiety.     [name] nystatin (MYCOSTATIN) 100000 UNIT/ML suspension     . promethazine (PHENERGAN) 25 MG tablet     . ranitidine (ZANTAC) 150 MG tablet Take 150 mg by mouth at bedtime.    [name] tiotropium (  SPIRIVA) 18 MCG inhalation capsule Place 18 mcg into inhaler and inhale daily.     No current facility-administered medications for this visit.     Review of Systems  Constitutional: Positive for weight loss (About 30 lbs since last summer).  HENT: Positive for sore throat.   Eyes: Negative.   Respiratory: Negative.   Cardiovascular: Negative.   Gastrointestinal: Negative.   Genitourinary: Negative.   Musculoskeletal: Negative.   Skin: Negative.   Neurological: Negative.   Endo/Heme/Allergies: Negative.   Psychiatric/Behavioral: Negative.   All other systems reviewed and are negative. 14 point ROS was done and is otherwise as detailed above or in HPI   PHYSICAL EXAMINATION: ECOG PERFORMANCE STATUS: 1 - Symptomatic but completely ambulatory  Vitals:   06/27/16 1430  BP: (!) 156/58  Pulse: 93  Resp: 18  Temp: 98.7 F (37.1 C)   Filed Weights   06/27/16 1430  Weight: 104 lb 9.6 oz (47.4 kg)   Physical Exam  Constitutional: She is oriented to person, place, and time and well-developed, well-nourished, and in no distress.  HENT:  Head: Normocephalic and atraumatic.  Mouth/Throat: Oropharynx is clear and moist.  Two  ulcerations on uvula, one on the lateral left aspect of the uvula; one on the uvula, both 4 to 5 mm in largest dimension.  The uvula itself is very erythematous No lymphadenopathy palpated.  Eyes: Conjunctivae and EOM are normal. Pupils are equal, round, and reactive to light. Right eye exhibits no discharge. Left eye exhibits no discharge. No scleral icterus.  Neck: Normal range of motion. Neck supple. No tracheal deviation present. No thyromegaly present.  Cardiovascular: Normal rate, regular rhythm and normal heart sounds.   No murmur heard. Pulmonary/Chest: Breath sounds normal. No respiratory distress. She has no wheezes. She has no rales. She exhibits no tenderness.  Abdominal: Soft. Bowel sounds are normal. She exhibits no distension and no mass. There is tenderness. There is no rebound and no guarding.  Musculoskeletal: Normal range of motion. She exhibits no edema or tenderness.  Neurological: She is alert and oriented to person, place, and time. Gait normal.  Skin: Skin is warm and dry. No rash noted. No erythema. No pallor.  Psychiatric: Mood, memory, affect and judgment normal.  Nursing note and vitals reviewed.   LABORATORY DATA:  I have reviewed the data as listed Lab Results  Component Value Date   WBC 9.7 06/01/2016   HGB 12.6 06/08/2016   HCT 37.0 06/08/2016   MCV 89.2 06/01/2016   PLT 344 06/01/2016   CMP     Component Value Date/Time   NA 133 (L) 06/08/2016 0740   NA 143 01/14/2013   K 4.1 06/08/2016 0740   K 4.7 01/14/2013   CL 91 (L) 06/01/2016 1016   CO2 29 06/01/2016 1016   GLUCOSE 105 (H) 06/08/2016 0740   BUN 6 06/01/2016 1016   BUN 10 01/14/2013   CREATININE 0.84 06/01/2016 1016   CREATININE 0.74 01/14/2013   CALCIUM 10.1 06/01/2016 1016   CALCIUM 10.2 01/14/2013   PROT 7.4 03/20/2011 1714   ALBUMIN 4.5 01/14/2013   AST 12 01/14/2013   ALT 9 01/14/2013   ALKPHOS 87 01/14/2013   BILITOT 0.3 01/14/2013   GFRNONAA >60 06/01/2016 1016   GFRAA >60  06/01/2016 1016     RADIOGRAPHIC STUDIES: I have personally reviewed the radiological images as listed and agreed with the findings in the report. Study Result   CLINICAL DATA:  Screening recall for a possible right breast  mass.  EXAM: 2D DIGITAL DIAGNOSTIC UNILATERAL RIGHT MAMMOGRAM WITH CAD AND ADJUNCT TOMO  RIGHT BREAST ULTRASOUND  COMPARISON:  Previous exam(s).  ACR Breast Density Category b: There are scattered areas of fibroglandular density.  FINDINGS: In the medial right breast, posterior depth there is a persistent oval obscured mass measuring approximately 3 mm. No other suspicious calcifications, masses or areas of distortion are seen in the right breast.  Mammographic images were processed with CAD.  Physical exam of the medial right breast demonstrates no discrete palpable masses.  Ultrasound of the right breast at 3 o'clock, 4 cm from the nipple demonstrates a hypoechoic circumscribed oval mass measuring 3 x 3 x 2 mm corresponding with the mammographic mass of concern. No suspicious masses or areas of shadowing are identified.  IMPRESSION: 1. The 3 mm right breast mass at 3 o'clock is probably benign, most likely representing a cyst.  RECOMMENDATION: Six-month follow-up right breast ultrasound.  I have discussed the findings and recommendations with the patient. Results were also provided in writing at the conclusion of the visit. If applicable, a reminder letter will be sent to the patient regarding the next appointment.  BI-RADS CATEGORY  3: Probably benign.   Electronically Signed   By: Ammie Ferrier M.D.   On: 03/01/2016 16:19    PATHOLOGY   ASSESSMENT & PLAN:  Squamous cell carcinoma of the uvula, suspicious for invasion Tobacco Abuse Weight Loss Hx H. Pylori Abdominal pain  The patient is here for further evaluation and discussion of newly diagnosed squamous cell carcinoma of the uvula, suspicious for invasion. I  don't think at this point additional biopsy is warranted to confirm invasive disease. I personally reviewed and went over pathology results with the patient and the need for further work up before making treatment recommendations.  The patient will be scheduled for CT Neck and Chest. If there are positive findings, we will order a PET.   I spoke with Dr. Isidore Moos of radiation oncology about the patient's case and eligibility for radiation treatment. She will review CT scans and see the patient is [place].   The patient reports significant weight loss of about 30 lbs since last summer. On chart review she is down about 14 pound since last year. She was diagnosed with H. Pylori but in 2014 She complains of abdominal pain on exam.  I have ordered abdominal imaging.  I addressed the importance of smoking cessation with the patient in detail.  We discussed the health benefits of cessation.  We discussed the health detriments of ongoing tobacco use including but not limited to COPD, heart disease and malignancy. We reviewed the multiple options for cessation and I offered to refer her to smoking cessation classes. We discussed other alternatives to quit such as chantix, wellbutrin. We will continue to address this moving forward.  She will return for follow up post scans to discuss these results.  Orders Placed This Encounter  Procedures  . CT Abdomen W Contrast    Standing Status:   Future    Standing Expiration Date:   06/27/2017    Order Specific Question:   If indicated for the ordered procedure, I authorize the administration of contrast media per Radiology protocol    Answer:   Yes    Order Specific Question:   Reason for Exam (SYMPTOM  OR DIAGNOSIS REQUIRED)    Answer:   uvular carcinoma, 30 pound weight loss, abdominal pain, smoker    Order Specific Question:   Preferred imaging  location?    Answer:   [facility]  . CT Chest W Contrast    Standing Status:   Future    Standing Expiration  Date:   06/27/2017    Order Specific Question:   If indicated for the ordered procedure, I authorize the administration of contrast media per Radiology protocol    Answer:   Yes    Order Specific Question:   Reason for Exam (SYMPTOM  OR DIAGNOSIS REQUIRED)    Answer:   uvular carcinoma, 30 pound weight loss, abdominal pain, smoker    Order Specific Question:   Preferred imaging location?    Answer:   [facility]  . CT SOFT TISSUE NECK W CONTRAST    Standing Status:   Future    Standing Expiration Date:   09/27/2017    Order Specific Question:   If indicated for the ordered procedure, I authorize the administration of contrast media per Radiology protocol    Answer:   Yes    Order Specific Question:   Reason for Exam (SYMPTOM  OR DIAGNOSIS REQUIRED)    Answer:   uvular carcinoma, 30 pound weight loss, abdominal pain, smoker    Order Specific Question:   Preferred imaging location?    Answer:   [facility]    MEDICATIONS PRESCRIBED THIS ENCOUNTER: Meds ordered this encounter  Medications  . Influenza vac split quadrivalent PF (FLUARIX) injection 0.5 mL  . promethazine (PHENERGAN) 25 MG tablet  . nystatin (MYCOSTATIN) 100000 UNIT/ML suspension    All questions were answered. The patient knows to call the clinic with any problems, questions or concerns.  This document serves as a record of services personally performed by Ancil Linsey, MD. It was created on her behalf by Arlyce Harman, a trained medical scribe. The creation of this record is based on the scribe's personal observations and the provider's statements to them. This document has been checked and approved by the attending provider.  I have reviewed the above documentation for accuracy and completeness and I agree with the above.  This note was electronically signed.  Molli Hazard, MD  06/27/2016 3:33 PM

## 2016-06-27 NOTE — Patient Instructions (Addendum)
[place] at [facility] Discharge Instructions  RECOMMENDATIONS MADE BY THE CONSULTANT AND ANY TEST RESULTS WILL BE SENT TO YOUR REFERRING PHYSICIAN.  You saw Dr. Whitney Muse today. You had Flu shot today. You will be scheduled for CT scans.  Follow up with MD after scans.  Thank you for choosing [place] at [facility] to provide your oncology and hematology care.  To afford each patient quality time with our provider, please arrive at least 15 minutes before your scheduled appointment time.   Beginning January 23rd 2017 lab work for the [facility] will be done in the  Main lab at [facility] on 1st floor. If you have a lab appointment with the [facility] please come in thru the  Main Entrance and check in at the main information desk  You need to re-schedule your appointment should you arrive 10 or more minutes late.  We strive to give you quality time with our providers, and arriving late affects you and other patients whose appointments are after yours.  Also, if you no show three or more times for appointments you may be dismissed from the clinic at the providers discretion.     Again, thank you for choosing [facility].  Our hope is that these requests will decrease the amount of time that you wait before being seen by our physicians.       _____________________________________________________________  Should you have questions after your visit to [facility], please contact our office at [phone] between the hours of 8:30 a.m. and 4:30 p.m.  Voicemails left after 4:30 p.m. will not be returned until the following business day.  For prescription refill requests, have your pharmacy contact our office.         Resources For Cancer Patients and their Caregivers ? American Cancer Society: Can assist with transportation, wigs, general needs, runs Look Good Feel Better.        318-822-1234 ? Cancer  Care: Provides financial assistance, online support groups, medication/co-pay assistance.  1-800-813-HOPE 713-683-3908) ? [facility] Assists [place] cancer patients and their families through emotional , educational and financial support.  870-295-4714 ? [place] DSS Where to apply for food stamps, Medicaid and utility assistance. 212-069-9912 ? [facility]: Transportation to medical appointments. 509-751-0524 ? Social Security Administration: May apply for disability if have a Stage IV cancer. (901)619-3734 609-113-8794 ? [employer], Disability and Transit Services: Assists with nutrition, care and transit needs. [place] Support Programs: '@10RELATIVEDAYS'$ @ > Cancer Support Group  2nd Tuesday of the month 1pm-2pm, Journey Room  > Creative Journey  3rd Tuesday of the month 1130am-1pm, Journey Room  > Look Good Feel Better  1st Wednesday of the month 10am-12 noon, Journey Room (Call American Cancer Society to register 662-620-4596)   Influenza Virus Vaccine injection (Fluarix) What is this medicine? INFLUENZA VIRUS VACCINE (in floo EN zuh VAHY ruhs vak SEEN) helps to reduce the risk of getting influenza also known as the flu. This medicine may be used for other purposes; ask your health care provider or pharmacist if you have questions. What should I tell my health care provider before I take this medicine? They need to know if you have any of these conditions: -bleeding disorder like hemophilia -fever or infection -Guillain-Barre syndrome or other neurological problems -immune system problems -infection with the human immunodeficiency virus (HIV) or AIDS -low blood platelet counts -multiple sclerosis -an  unusual or allergic reaction to influenza virus vaccine, eggs, chicken proteins, latex, gentamicin, other medicines, foods, dyes or preservatives -pregnant or trying to get pregnant -breast-feeding How should I use this  medicine? This vaccine is for injection into a muscle. It is given by a health care professional. A copy of Vaccine Information Statements will be given before each vaccination. Read this sheet carefully each time. The sheet may change frequently. Talk to your pediatrician regarding the use of this medicine in children. Special care may be needed. Overdosage: If you think you have taken too much of this medicine contact a poison control center or emergency room at once. NOTE: This medicine is only for you. Do not share this medicine with others. What if I miss a dose? This does not apply. What may interact with this medicine? -chemotherapy or radiation therapy -medicines that lower your immune system like etanercept, anakinra, infliximab, and adalimumab -medicines that treat or prevent blood clots like warfarin -phenytoin -steroid medicines like prednisone or cortisone -theophylline -vaccines This list may not describe all possible interactions. Give your health care provider a list of all the medicines, herbs, non-prescription drugs, or dietary supplements you use. Also tell them if you smoke, drink alcohol, or use illegal drugs. Some items may interact with your medicine. What should I watch for while using this medicine? Report any side effects that do not go away within 3 days to your doctor or health care professional. Call your health care provider if any unusual symptoms occur within 6 weeks of receiving this vaccine. You may still catch the flu, but the illness is not usually as bad. You cannot get the flu from the vaccine. The vaccine will not protect against colds or other illnesses that may cause fever. The vaccine is needed every year. What side effects may I notice from receiving this medicine? Side effects that you should report to your doctor or health care professional as soon as possible: -allergic reactions like skin rash, itching or hives, swelling of the face, lips, or  tongue Side effects that usually do not require medical attention (report to your doctor or health care professional if they continue or are bothersome): -fever -headache -muscle aches and pains -pain, tenderness, redness, or swelling at site where injected -weak or tired This list may not describe all possible side effects. Call your doctor for medical advice about side effects. You may report side effects to FDA at 1-800-FDA-1088. Where should I keep my medicine? This vaccine is only given in a clinic, pharmacy, doctor's office, or other health care setting and will not be stored at home. NOTE: This sheet is a summary. It may not cover all possible information. If you have questions about this medicine, talk to your doctor, pharmacist, or health care provider.    2016, Elsevier/Gold Standard. (2008-04-09 09:30:40)

## 2016-06-28 ENCOUNTER — Encounter (HOSPITAL_COMMUNITY): Payer: Self-pay | Admitting: Hematology & Oncology

## 2016-07-01 ENCOUNTER — Ambulatory Visit (HOSPITAL_COMMUNITY)
Admission: RE | Admit: 2016-07-01 | Discharge: 2016-07-01 | Disposition: A | Discharge status: Home / Self Care | Payer: Medicare Other | Source: Ambulatory Visit | Attending: Hematology & Oncology | Admitting: Hematology & Oncology

## 2016-07-01 DIAGNOSIS — R918 Other nonspecific abnormal finding of lung field: Secondary | ICD-10-CM | POA: Insufficient documentation

## 2016-07-01 DIAGNOSIS — C109 Malignant neoplasm of oropharynx, unspecified: Secondary | ICD-10-CM | POA: Insufficient documentation

## 2016-07-01 DIAGNOSIS — I2510 Atherosclerotic heart disease of native coronary artery without angina pectoris: Secondary | ICD-10-CM | POA: Insufficient documentation

## 2016-07-01 DIAGNOSIS — J439 Emphysema, unspecified: Secondary | ICD-10-CM | POA: Insufficient documentation

## 2016-07-01 DIAGNOSIS — I708 Atherosclerosis of other arteries: Secondary | ICD-10-CM | POA: Diagnosis not present

## 2016-07-01 DIAGNOSIS — R932 Abnormal findings on diagnostic imaging of liver and biliary tract: Secondary | ICD-10-CM | POA: Diagnosis not present

## 2016-07-01 DIAGNOSIS — R634 Abnormal weight loss: Secondary | ICD-10-CM | POA: Insufficient documentation

## 2016-07-01 DIAGNOSIS — I700 Atherosclerosis of aorta: Secondary | ICD-10-CM | POA: Insufficient documentation

## 2016-07-01 MED ORDER — IOPAMIDOL (ISOVUE-300) INJECTION 61%
100.0000 mL | Freq: Once | INTRAVENOUS | Status: AC | PRN
  Administered 2016-07-01: 100 mL via INTRAVENOUS

## 2016-07-06 ENCOUNTER — Encounter (HOSPITAL_BASED_OUTPATIENT_CLINIC_OR_DEPARTMENT_OTHER): Payer: Medicare Other | Admitting: Hematology & Oncology

## 2016-07-06 ENCOUNTER — Encounter (HOSPITAL_COMMUNITY): Payer: Self-pay | Admitting: Hematology & Oncology

## 2016-07-06 VITALS — BP 158/60 | HR 92 | Temp 98.1°F | Resp 18 | Wt 105.4 lb

## 2016-07-06 DIAGNOSIS — Z720 Tobacco use: Secondary | ICD-10-CM | POA: Diagnosis not present

## 2016-07-06 DIAGNOSIS — R634 Abnormal weight loss: Secondary | ICD-10-CM | POA: Diagnosis not present

## 2016-07-06 DIAGNOSIS — C109 Malignant neoplasm of oropharynx, unspecified: Secondary | ICD-10-CM

## 2016-07-06 DIAGNOSIS — R109 Unspecified abdominal pain: Secondary | ICD-10-CM | POA: Diagnosis not present

## 2016-07-06 DIAGNOSIS — R1013 Epigastric pain: Secondary | ICD-10-CM

## 2016-07-06 NOTE — Progress Notes (Signed)
[facility]  Progress Note  Patient Care Team: [name], MD as PCP - General (Internal Medicine)  CHIEF COMPLAINTS/PURPOSE OF CONSULTATION:  Squamous cell carcinoma of the uvula, suspicious for invasion Tobacco Abuse  HISTORY OF PRESENTING ILLNESS:  Lindsey Combs 70 y.o. female is here because of newly diagnosed squamous cell carcinoma of the uvula, suspicious for invasion.  Lindsey Combs is a pleasant 70 y.o. who presented with severe sore throat which persisted through antibiotics. She underwent a laryngoscopy on 06/08/2016 with Dr. Benjamine Mola showing severe posterior laryngeal edema. 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 She then underwent a direct laryngoscopy and biopsy of her uvula on 06/08/2016 with Dr. Benjamine Mola. Pathology of the uvula revealed squamous cell carcinoma suspicious for invasion.  Lindsey Combs is accompanied by her daughter. She presents to review recent imaging and for further discussion of treatment recommendations.   The patient is agreeable to being referred to [facility] for surgical consultation. She notes her daughter knows that area and is willing to drive her.   She continues to smoke.  She is scheduled for a follow up for her mammogram in December.   MEDICAL HISTORY:  Past Medical History:  Diagnosis Date  . Acute respiratory failure with hypoxia ([place])   . Anxiety   . COPD (chronic obstructive pulmonary disease) ([place])   . DDD (degenerative disc disease), lumbar   . Degenerative joint disease of spine   . Diverticulosis   . GERD (gastroesophageal reflux disease)   . H. pylori infection    treated 02/2013.  [name] History of UTI   . Hypercholesterolemia   . Hypertension   . Hyponatremia   . Peripheral neuropathy ([place])   . Shortness of breath    with exertion  . Tobacco abuse     SURGICAL HISTORY: Past Surgical History:  Procedure Laterality  Date  . ABDOMINAL EXPLORATION SURGERY     age 66, large ovarian cyst  . BIOPSY N/A 03/01/2013   [facility]:BJSE hiatal hernia; otherwise, normal examination/ Status post biopsies as described above. SB bx negative for Celiac. +h/pylori  . CATARACT EXTRACTION W/ INTRAOCULAR LENS  IMPLANT, BILATERAL    . COLONOSCOPY  10/16/09   Jenkins:3 polypsin the sigmoid colon/polyps in the descending colon and the rectum/scattered diverticulum. hyperplastic  . COLONOSCOPY WITH ESOPHAGOGASTRODUODENOSCOPY (EGD) N/A 03/01/2013   GBT:DVVOHYWV colonic polyps - treated/removed as described above. Colonic diverticulosis. Hyperplastic. Next TCS 02/2018.  [name] DIRECT LARYNGOSCOPY N/A 06/08/2016   Procedure: DIRECT LARYNGOSCOPY AND BIOPSY;  Surgeon: Leta Baptist, MD;  Location: [facility] OR;  Service: ENT;  Laterality: N/A;  . PARTIAL HYSTERECTOMY     age 58    SOCIAL HISTORY: Social History   Social History  . Marital status: Divorced    Spouse name: N/A  . Number of children: 4  . Years of education: N/A   Occupational History  . Not on file.   Social History Main Topics  . Smoking status: Current Every Day Smoker    Packs/day: 1.50    Years: 50.00    Types: Cigarettes  . Smokeless tobacco: Never Used  . Alcohol use No     Comment: glass of wine occasionally  . Drug use: No  . Sexual activity: Not on file   Other Topics Concern  . Not on file   Social History Narrative  . No narrative on file   Divorced. Lives alone in senior housing  4 children; 2 sons and 2 daughters 6 grandchildren 3 great grandchildren Smoker since 63 or 51 yo. 1 ppd ETOH, used to big time, she liked beer. She stopped in 2001 when she started going to church. 1 dog named Eduard Clos, part terrier part [place] She enjoys gardening.  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 Then she took care of her mom and dad until they went in retirement homes in 2008  FAMILY HISTORY: Family History  Problem Relation Age of Onset  .  Leukemia Father   . Thyroid disease Father   . Stomach cancer Paternal Grandfather   . Colon cancer Neg Hx    Mother deceased at 52 yo. She had Alzheimer's but she was healthy Father deceased at 6 yo of blood cancer. He had to have a transfusion every 2 months. Then his potassium was too high and they couldn't give him anymore. 2 brothers. Oldest brother had a heart attack earlier this year. Other brother has chronic stomach ulcers  ALLERGIES:  is allergic to chantix [varenicline] and codeine.  MEDICATIONS:  Current Outpatient Prescriptions  Medication Sig Dispense Refill  . acetaminophen (TYLENOL) 500 MG tablet Take 500 mg by mouth every 6 (six) hours as needed for mild pain.    [name] albuterol (PROAIR HFA) 108 (90 BASE) MCG/ACT inhaler Inhale 2 puffs into the lungs every 4 (four) hours as needed for wheezing or shortness of breath. 1 Inhaler 1  . atorvastatin (LIPITOR) 20 MG tablet Take 20 mg by mouth daily.    . hydrochlorothiazide (MICROZIDE) 12.5 MG capsule Take 12.5 mg by mouth daily.    [name] HYDROcodone-acetaminophen (NORCO/VICODIN) 5-325 MG tablet     . LORazepam (ATIVAN) 0.5 MG tablet     . LORazepam (ATIVAN) 1 MG tablet Take 0.5 mg by mouth 3 (three) times daily as needed for anxiety.     [name] MYRBETRIQ 25 MG TB24 tablet     . nystatin (MYCOSTATIN) 100000 UNIT/ML suspension     . promethazine (PHENERGAN) 25 MG tablet     . ranitidine (ZANTAC) 150 MG tablet Take 150 mg by mouth at bedtime.    [name] tiotropium (SPIRIVA) 18 MCG inhalation capsule Place 18 mcg into inhaler and inhale daily.     No current facility-administered medications for this visit.     Review of Systems  HENT: Positive for sore throat.   Eyes: Negative.   Respiratory: Negative.   Cardiovascular: Negative.   Gastrointestinal: Negative.   Genitourinary: Negative.   Musculoskeletal: Negative.   Skin: Negative.   Neurological: Negative.   Endo/Heme/Allergies: Negative.   Psychiatric/Behavioral: Negative.   All  other systems reviewed and are negative. 14 point ROS was done and is otherwise as detailed above or in HPI   PHYSICAL EXAMINATION: ECOG PERFORMANCE STATUS: 1 - Symptomatic but completely ambulatory  Vitals:   07/06/16 1624  BP: (!) 158/60  Pulse: 92  Resp: 18  Temp: 98.1 F (36.7 C)   Filed Weights   07/06/16 1624  Weight: 105 lb 6.4 oz (47.8 kg)   Physical Exam  Constitutional: She is oriented to person, place, and time and well-developed, well-nourished, and in no distress.  HENT:  Head: Normocephalic and atraumatic.  Mouth/Throat: Oropharynx is clear and moist.  Two ulcerations on uvula, one on the lateral left aspect of the uvula; one on the uvula, both 4 to 5 mm in largest dimension.  The uvula itself is very erythematous No lymphadenopathy palpated.  Eyes: Conjunctivae and EOM are  normal. Pupils are equal, round, and reactive to light. Right eye exhibits no discharge. Left eye exhibits no discharge. No scleral icterus.  Neck: Normal range of motion. Neck supple. No tracheal deviation present. No thyromegaly present.  Cardiovascular: Normal rate, regular rhythm and normal heart sounds.   No murmur heard. Pulmonary/Chest: Breath sounds normal. No respiratory distress. She has no wheezes. She has no rales. She exhibits no tenderness.  Abdominal: Soft. Bowel sounds are normal. She exhibits no distension and no mass. There is tenderness. There is no rebound and no guarding.  Musculoskeletal: Normal range of motion. She exhibits no edema or tenderness.  Neurological: She is alert and oriented to person, place, and time. Gait normal.  Skin: Skin is warm and dry. No rash noted. No erythema. No pallor.  Psychiatric: Mood, memory, affect and judgment normal.  Nursing note and vitals reviewed.   LABORATORY DATA:  I have reviewed the data as listed Lab Results  Component Value Date   WBC 9.7 06/01/2016   HGB 12.6 06/08/2016   HCT 37.0 06/08/2016   MCV 89.2 06/01/2016   PLT  344 06/01/2016   CMP     Component Value Date/Time   NA 133 (L) 06/08/2016 0740   NA 143 01/14/2013   K 4.1 06/08/2016 0740   K 4.7 01/14/2013   CL 91 (L) 06/01/2016 1016   CO2 29 06/01/2016 1016   GLUCOSE 105 (H) 06/08/2016 0740   BUN 6 06/01/2016 1016   BUN 10 01/14/2013   CREATININE 0.84 06/01/2016 1016   CREATININE 0.74 01/14/2013   CALCIUM 10.1 06/01/2016 1016   CALCIUM 10.2 01/14/2013   PROT 7.4 03/20/2011 1714   ALBUMIN 4.5 01/14/2013   AST 12 01/14/2013   ALT 9 01/14/2013   ALKPHOS 87 01/14/2013   BILITOT 0.3 01/14/2013   GFRNONAA >60 06/01/2016 1016   GFRAA >60 06/01/2016 1016     RADIOGRAPHIC STUDIES: I have personally reviewed the radiological images as listed and agreed with the findings in the report. Study Result   CLINICAL DATA:  Initial evaluation for newly diagnosed squamous cell carcinoma of the uvula, suspicious for invasion. History of tobacco abuse.  EXAM: CT NECK WITH CONTRAST  TECHNIQUE: Multidetector CT imaging of the neck was performed using the standard protocol following the bolus administration of intravenous contrast.  CONTRAST:  124m ISOVUE-300 IOPAMIDOL (ISOVUE-300) INJECTION 61%  COMPARISON:  None available.  FINDINGS: Pharynx and larynx: Oral cavity within normal limits. Patient is edentulous. No base of tongue mass identified. Tonsillar fossas within normal limits. Base of the uvula is somewhat edematous in appearance. The distal aspect of the uvula is somewhat hyper enhancing, which may reflect postsurgical changes and/ or site of disease (series 10, image 43). There is posterior and superior extension of these changes towards the base of the uvula without further extension (series 16, image 14). Remainder of the oropharynx within normal limits. Parapharyngeal space preserved. Retropharyngeal space within normal limits. Epiglottis normal. Vallecula clear. Piriform sinuses are largely clear. True cords are opposed  and somewhat edematous in appearance. Subglottic airway clear.  Salivary glands: Visualized parotid glands within normal limits. Submandibular glands normal.  Thyroid: Negative.  Lymph nodes: No pathologically enlarged lymph nodes identified within the neck.  Vascular: Normal intravascular enhancement seen throughout the neck. Prominent vascular calcifications noted about the carotid bifurcations bilaterally, left greater than right. Vascular calcifications also noted within the aortic arch and at the origin of the great vessels.  Mastoids and visualized paranasal sinuses: Partially visualized  paranasal sinuses are clear. Inferior right mastoid air cells are clear. Partial opacification of the inferior left mastoid air cells.  Skeleton: No acute osseous abnormality. No worrisome lytic or blastic osseous lesions.  Upper chest: Centrilobular and paraseptal emphysematous changes present within the visualized lungs. 2 adjacent nodules measuring up to 3 mm present within the left upper lobe (series 17, image 94, 96), indeterminate.  IMPRESSION: 1. Irregular hyper enhancement of the distal aspect of the uvula, which may reflect postsurgical changes and/or sequelae of known squamous cell carcinoma at this location. Combs superior extension towards the base of the uvula as above. 2. No other evidence for disease within the neck.  No adenopathy. 3. Severe emphysema. 4. Prominent atherosclerosis within the neck. 5. Two adjacent nodules measuring up to 3 mm within the left upper lobe, indeterminate. Attention at follow-up recommended.   Electronically Signed   By: Jeannine Boga M.D.   On: 07/01/2016 16:37   Study Result   CLINICAL DATA:  34 pound weight loss in 2 years. Oral pharyngeal carcinoma diagnosed 2 weeks ago. Smoker.  EXAM: CT CHEST, ABDOMEN, AND PELVIS WITH CONTRAST  TECHNIQUE: Multidetector CT imaging of the chest, abdomen and pelvis  was performed following the standard protocol during bolus administration of intravenous contrast.  CONTRAST:  140m ISOVUE-300 IOPAMIDOL (ISOVUE-300) INJECTION 61%  COMPARISON:  Chest radiograph 04/03/2015.  No prior CTs.  FINDINGS: CT CHEST FINDINGS  Cardiovascular: Advanced aortic and branch vessel atherosclerosis. Mild cardiomegaly. Multivessel coronary artery atherosclerosis. No pericardial effusion. No central pulmonary embolism, on this non-dedicated study.  Mediastinum/Nodes: No mediastinal or hilar adenopathy.  Lungs/Pleura: No pleural fluid. Lower lobe predominant bronchial wall thickening. Mild centrilobular emphysema.  Posterior right upper lobe scarring.  Musculoskeletal: No acute osseous abnormality.  CT ABDOMEN PELVIS FINDINGS  Hepatobiliary: Subtle irregular hepatic capsule, including on image 61/series 3. No focal liver lesion. Normal gallbladder, without biliary ductal dilatation.  Pancreas: Normal, without mass or ductal dilatation.  Spleen: Old granulomatous disease within.  Adrenals/Urinary Tract: Normal right adrenal gland. Left adrenal thickening with maintenance of adreniform shape. Bilateral too small to characterize renal lesions. An interpolar right renal cyst or minimally complex cyst measures 9 mm. No hydronephrosis. Normal urinary bladder.  Stomach/Bowel: Normal stomach, without wall thickening. Normal colon and terminal ileum. Normal small bowel.  Vascular/Lymphatic: Advanced abdominal aortic and branch vessel atherosclerosis. No abdominopelvic adenopathy.  Reproductive: Hysterectomy.  No adnexal mass.  Other: No significant free fluid. Mild pelvic floor laxity. No evidence of omental or peritoneal disease.  Musculoskeletal: Mild osteopenia. Disc bulges at L1-2, L2-3, and L3-4.  IMPRESSION: CT CHEST IMPRESSION  1.  No acute process or evidence of metastatic disease in the chest. 2.  Coronary artery  atherosclerosis. Aortic atherosclerosis.  CT ABDOMEN AND PELVIS IMPRESSION  1. No acute process or evidence of metastatic disease in the abdomen or pelvis. 2. Advanced abdominal aortic atherosclerosis. 3. Subtly irregular hepatic capsule could represent mild cirrhosis or be an artifact secondary to iterative reconstruction technique. Correlate with risk factors for cirrhosis.   Electronically Signed   By: KAbigail MiyamotoM.D.   On: 07/01/2016 15:26    PATHOLOGY   ASSESSMENT & PLAN:  Squamous cell carcinoma of the uvula, suspicious for invasion T1 N0 M0 squamous cell carcinoma of the uvula  Tobacco Abuse Weight Loss Hx H. Pylori Abdominal pain  CT imaging was reviewed with the patient and her daughter. Results are noted above. No evidence of metastatic disease. She has two options for therapy. XRT vs.  Surgical therapy. They would like a surgical opinion. I have arranged for consultation at [facility].    The patient reports significant weight loss of about 30 lbs since last summer. On chart review she is down about 14 pound since last year. She was diagnosed with H. Pylori but in 2014 She complains of abdominal pain on exam.  On imaging she has advanced abdominal aortic atherosclerosis. I do not know if this is contributing to her abdominal pain (? Intestinal angina) Will discuss further at follow-up. Weight is currently stable.   I addressed the importance of smoking cessation with the patient in detail.  We discussed the health benefits of cessation.  We discussed the health detriments of ongoing tobacco use including but not limited to COPD, heart disease and malignancy. We reviewed the multiple options for cessation and I offered to refer her to smoking cessation classes. We discussed other alternatives to quit such as chantix, wellbutrin. We will continue to address this moving forward.  She will return for follow up in 6 to 8 weeks.  MEDICATIONS PRESCRIBED THIS ENCOUNTER: Meds  ordered this encounter  Medications  . HYDROcodone-acetaminophen (NORCO/VICODIN) 5-325 MG tablet  . LORazepam (ATIVAN) 0.5 MG tablet  . MYRBETRIQ 25 MG TB24 tablet    All questions were answered. The patient knows to call the clinic with any problems, questions or concerns.  This document serves as a record of services personally performed by Ancil Linsey, MD. It was created on her behalf by Arlyce Harman, a trained medical scribe. The creation of this record is based on the scribe's personal observations and the provider's statements to them. This document has been checked and approved by the attending provider.  I have reviewed the above documentation for accuracy and completeness and I agree with the above.  This note was electronically signed.  Molli Hazard, MD  07/06/2016 4:29 PM

## 2016-07-06 NOTE — Patient Instructions (Addendum)
[place] at [facility] Discharge Instructions  RECOMMENDATIONS MADE BY THE CONSULTANT AND ANY TEST RESULTS WILL BE SENT TO YOUR REFERRING PHYSICIAN.  You saw Dr. Whitney Muse today. You are being referred to Dr. [name] or Dr. Nicolette Bang at [facility]. Follow up in 8 weeks with Dr. Whitney Muse.  Thank you for choosing [place] at [facility] to provide your oncology and hematology care.  To afford each patient quality time with our provider, please arrive at least 15 minutes before your scheduled appointment time.   Beginning January 23rd 2017 lab work for the [facility] will be done in the  Main lab at [facility] on 1st floor. If you have a lab appointment with the [facility] please come in thru the  Main Entrance and check in at the main information desk  You need to re-schedule your appointment should you arrive 10 or more minutes late.  We strive to give you quality time with our providers, and arriving late affects you and other patients whose appointments are after yours.  Also, if you no show three or more times for appointments you may be dismissed from the clinic at the providers discretion.     Again, thank you for choosing [facility].  Our hope is that these requests will decrease the amount of time that you wait before being seen by our physicians.       _____________________________________________________________  Should you have questions after your visit to [facility], please contact our office at [phone] between the hours of 8:30 a.m. and 4:30 p.m.  Voicemails left after 4:30 p.m. will not be returned until the following business day.  For prescription refill requests, have your pharmacy contact our office.         Resources For Cancer Patients and their Caregivers ? American Cancer Society: Can assist with transportation, wigs, general needs, runs Look Good Feel Better.         239-370-5433 ? Cancer Care: Provides financial assistance, online support groups, medication/co-pay assistance.  1-800-813-HOPE (450) 231-1575) ? [place] Assists [place] cancer patients and their families through emotional , educational and financial support.  3055648721 ? [place] DSS Where to apply for food stamps, Medicaid and utility assistance. (586)833-9780 ? [facility]: Transportation to medical appointments. (403) 372-3436 ? Social Security Administration: May apply for disability if have a Stage IV cancer. (873)390-5665 417-098-9943 ? [employer], Disability and Transit Services: Assists with nutrition, care and transit needs. [place] Support Programs: '@10RELATIVEDAYS'$ @ > Cancer Support Group  2nd Tuesday of the month 1pm-2pm, Journey Room  > Creative Journey  3rd Tuesday of the month 1130am-1pm, Journey Room  > Look Good Feel Better  1st Wednesday of the month 10am-12 noon, Journey Room (Call [place] to register [phone])

## 2016-07-07 ENCOUNTER — Encounter (HOSPITAL_COMMUNITY): Payer: Self-pay | Admitting: Lab

## 2016-07-07 NOTE — Progress Notes (Unsigned)
Referral to [place] / DR Silvio Clayman appt 1030'@18th'$ .Patient ti aware.  Office number is [phone]

## 2016-07-13 DIAGNOSIS — C052 Malignant neoplasm of uvula: Secondary | ICD-10-CM | POA: Insufficient documentation

## 2016-08-03 DIAGNOSIS — Z8619 Personal history of other infectious and parasitic diseases: Secondary | ICD-10-CM | POA: Insufficient documentation

## 2016-08-03 DIAGNOSIS — M1990 Unspecified osteoarthritis, unspecified site: Secondary | ICD-10-CM | POA: Insufficient documentation

## 2016-08-03 DIAGNOSIS — Z720 Tobacco use: Secondary | ICD-10-CM | POA: Insufficient documentation

## 2016-08-03 DIAGNOSIS — E785 Hyperlipidemia, unspecified: Secondary | ICD-10-CM | POA: Insufficient documentation

## 2016-08-03 DIAGNOSIS — K5790 Diverticulosis of intestine, part unspecified, without perforation or abscess without bleeding: Secondary | ICD-10-CM | POA: Insufficient documentation

## 2016-08-03 DIAGNOSIS — Z862 Personal history of diseases of the blood and blood-forming organs and certain disorders involving the immune mechanism: Secondary | ICD-10-CM | POA: Insufficient documentation

## 2016-08-03 DIAGNOSIS — I10 Essential (primary) hypertension: Secondary | ICD-10-CM | POA: Insufficient documentation

## 2016-09-01 ENCOUNTER — Ambulatory Visit (HOSPITAL_COMMUNITY): Payer: Medicare Other | Admitting: Hematology & Oncology

## 2016-09-20 ENCOUNTER — Emergency Department (HOSPITAL_COMMUNITY): Payer: Medicare Other

## 2016-09-20 ENCOUNTER — Encounter (HOSPITAL_COMMUNITY): Payer: Self-pay | Admitting: Emergency Medicine

## 2016-09-20 ENCOUNTER — Inpatient Hospital Stay (HOSPITAL_COMMUNITY)
Admission: EM | Admit: 2016-09-20 | Discharge: 2016-09-23 | DRG: 641 | Disposition: A | Discharge status: Home / Self Care | Payer: Medicare Other | Attending: Internal Medicine | Admitting: Internal Medicine

## 2016-09-20 DIAGNOSIS — Z66 Do not resuscitate: Secondary | ICD-10-CM | POA: Diagnosis not present

## 2016-09-20 DIAGNOSIS — Z681 Body mass index (BMI) 19 or less, adult: Secondary | ICD-10-CM

## 2016-09-20 DIAGNOSIS — I10 Essential (primary) hypertension: Secondary | ICD-10-CM | POA: Diagnosis present

## 2016-09-20 DIAGNOSIS — E7800 Pure hypercholesterolemia, unspecified: Secondary | ICD-10-CM | POA: Diagnosis present

## 2016-09-20 DIAGNOSIS — J441 Chronic obstructive pulmonary disease with (acute) exacerbation: Secondary | ICD-10-CM | POA: Diagnosis present

## 2016-09-20 DIAGNOSIS — F17200 Nicotine dependence, unspecified, uncomplicated: Secondary | ICD-10-CM | POA: Diagnosis present

## 2016-09-20 DIAGNOSIS — E440 Moderate protein-calorie malnutrition: Secondary | ICD-10-CM | POA: Insufficient documentation

## 2016-09-20 DIAGNOSIS — Z888 Allergy status to other drugs, medicaments and biological substances status: Secondary | ICD-10-CM

## 2016-09-20 DIAGNOSIS — K219 Gastro-esophageal reflux disease without esophagitis: Secondary | ICD-10-CM | POA: Diagnosis present

## 2016-09-20 DIAGNOSIS — F17210 Nicotine dependence, cigarettes, uncomplicated: Secondary | ICD-10-CM | POA: Diagnosis present

## 2016-09-20 DIAGNOSIS — F419 Anxiety disorder, unspecified: Secondary | ICD-10-CM | POA: Diagnosis present

## 2016-09-20 DIAGNOSIS — E871 Hypo-osmolality and hyponatremia: Principal | ICD-10-CM | POA: Diagnosis present

## 2016-09-20 DIAGNOSIS — M6281 Muscle weakness (generalized): Secondary | ICD-10-CM

## 2016-09-20 DIAGNOSIS — R42 Dizziness and giddiness: Secondary | ICD-10-CM | POA: Diagnosis not present

## 2016-09-20 DIAGNOSIS — E876 Hypokalemia: Secondary | ICD-10-CM | POA: Diagnosis present

## 2016-09-20 MED ORDER — SODIUM CHLORIDE 0.9 % IV BOLUS (SEPSIS)
500.0000 mL | Freq: Once | INTRAVENOUS | Status: AC
  Administered 2016-09-21: 500 mL via INTRAVENOUS

## 2016-09-20 MED ORDER — IPRATROPIUM-ALBUTEROL 0.5-2.5 (3) MG/3ML IN SOLN
3.0000 mL | Freq: Once | RESPIRATORY_TRACT | Status: AC
  Administered 2016-09-21: 3 mL via RESPIRATORY_TRACT
  Filled 2016-09-20: qty 3

## 2016-09-20 MED ORDER — SODIUM CHLORIDE 0.9 % IV SOLN
INTRAVENOUS | Status: DC
  Administered 2016-09-21: via INTRAVENOUS

## 2016-09-20 MED ORDER — ALBUTEROL SULFATE (2.5 MG/3ML) 0.083% IN NEBU
5.0000 mg | INHALATION_SOLUTION | Freq: Once | RESPIRATORY_TRACT | Status: AC
  Administered 2016-09-20: 5 mg via RESPIRATORY_TRACT
  Filled 2016-09-20: qty 6

## 2016-09-20 NOTE — ED Provider Notes (Signed)
[facility] DEPT Provider Note   CSN: [id number] Arrival date & time: 09/20/16  1913  By signing my name below, I, Lindsey Combs, attest that this documentation has been prepared under the direction and in the presence of Lindsey Sorrow, MD . Electronically Signed: Jeanell Combs, Scribe. 09/20/2016. 10:30 PM.  History   Chief Complaint Chief Complaint  Patient presents with  . Fall  . Shortness of Breath   The history is provided by the patient. No language interpreter was used.  Fall  This is a new problem. The problem occurs constantly. The problem has been gradually improving. Associated symptoms include chest pain (During cough) and shortness of breath. Pertinent negatives include no abdominal pain and no headaches. Nothing aggravates the symptoms. Nothing relieves the symptoms. She has tried nothing for the symptoms.  Shortness of Breath  This is a recurrent problem. The problem occurs intermittently.The current episode started 3 to 5 hours ago. The problem has been gradually improving. Associated symptoms include a fever, cough, chest pain (During cough) and rash (RLE). Pertinent negatives include no headaches, no rhinorrhea, no sore throat, no vomiting, no abdominal pain and no leg swelling. Associated medical issues include COPD.   HPI Comments: Lindsey Combs is a 70 y.o. female with a PMHx of COPD who presents to the Emergency Department complaining of a fall that occurred today. She reports getting a sudden onset of dizziness. She fell, head her head, but caught herself. Per EMS, pt had SOB and wheezing upon arrival and was given a nebulizer treatment en route with some relief. Her SpO2 was 100% in Triage. She states she has been staggering and having difficulty ambulating for the past year. She reports associated symptoms of productive cough (white sputum) nausea, subjective fever, chills, and chest pain (only during cough). She uses an albuterol inhaler at home. She denies  any diarrhea. [name]     PCP: Lindsey Gravel, MD  Past Medical History:  Diagnosis Date  . Acute respiratory failure with hypoxia ([place])   . Anxiety   . COPD (chronic obstructive pulmonary disease) ([place])   . DDD (degenerative disc disease), lumbar   . Degenerative joint disease of spine   . Diverticulosis   . GERD (gastroesophageal reflux disease)   . H. pylori infection    treated 02/2013.  [name] History of UTI   . Hypercholesterolemia   . Hypertension   . Hyponatremia   . Peripheral neuropathy ([place])   . Shortness of breath    with exertion  . Tobacco abuse     Patient Active Problem List   Diagnosis Date Noted  . Chronic obstructive pulmonary disease with acute exacerbation ([place])   . Acute respiratory failure with hypoxia ([place]) 04/03/2015  . COPD exacerbation ([place]) 04/03/2015  . Hyponatremia 04/03/2015  . Dehydration 04/03/2015  . Hyperglycemia 04/03/2015  . Anxiety   . COPD (chronic obstructive pulmonary disease) ([place])   . Peripheral neuropathy ([place])   . Tobacco dependence   . Back pain, chronic 04/19/2013  . Abnormal weight loss 02/19/2013  . Anemia, iron deficiency 02/19/2013  . GERD (gastroesophageal reflux disease) 02/19/2013    Past Surgical History:  Procedure Laterality Date  . ABDOMINAL EXPLORATION SURGERY     age 17, large ovarian cyst  . BIOPSY N/A 03/01/2013   SKA:JGOT hiatal hernia; otherwise, normal examination/ Status post biopsies as described above. SB bx negative for Celiac. +h/pylori  . CATARACT EXTRACTION W/ INTRAOCULAR LENS  IMPLANT, BILATERAL    . COLONOSCOPY  10/16/09  [facility]:3 polypsin the sigmoid colon/polyps in the descending colon and the rectum/scattered diverticulum. hyperplastic  . COLONOSCOPY WITH ESOPHAGOGASTRODUODENOSCOPY (EGD) N/A 03/01/2013   NLZ:JQBHALPF colonic polyps - treated/removed as described above. Colonic diverticulosis. Hyperplastic. Next TCS 02/2018.  [name] DIRECT LARYNGOSCOPY N/A 06/08/2016   Procedure: DIRECT LARYNGOSCOPY AND BIOPSY;   Surgeon: Leta Baptist, MD;  Location: [facility] OR;  Service: ENT;  Laterality: N/A;  . PARTIAL HYSTERECTOMY     age 62    OB History    Gravida Para Term Preterm AB Living   '5 4 4   1     '$ SAB TAB Ectopic Multiple Live Births   1               Home Medications    Prior to Admission medications   Medication Sig Start Date End Date Taking? Authorizing Provider  acetaminophen (TYLENOL) 500 MG tablet Take 500 mg by mouth every 6 (six) hours as needed for mild pain.    Historical Provider, MD  albuterol (PROAIR HFA) 108 (90 BASE) MCG/ACT inhaler Inhale 2 puffs into the lungs every 4 (four) hours as needed for wheezing or shortness of breath. 04/06/15   Radene Gunning, NP  atorvastatin (LIPITOR) 20 MG tablet Take 20 mg by mouth daily.    Historical Provider, MD  hydrochlorothiazide (MICROZIDE) 12.5 MG capsule Take 12.5 mg by mouth daily.    Historical Provider, MD  HYDROcodone-acetaminophen (NORCO/VICODIN) 5-325 MG tablet  07/05/16   Historical Provider, MD  LORazepam (ATIVAN) 0.5 MG tablet  06/30/16   Historical Provider, MD  LORazepam (ATIVAN) 1 MG tablet Take 0.5 mg by mouth 3 (three) times daily as needed for anxiety.     Historical Provider, MD  MYRBETRIQ 25 MG TB24 tablet  07/05/16   Historical Provider, MD  nystatin (MYCOSTATIN) 100000 UNIT/ML suspension  05/26/16   Historical Provider, MD  promethazine (PHENERGAN) 25 MG tablet  06/16/16   Historical Provider, MD  ranitidine (ZANTAC) 150 MG tablet Take 150 mg by mouth at bedtime.    Historical Provider, MD  tiotropium (SPIRIVA) 18 MCG inhalation capsule Place 18 mcg into inhaler and inhale daily.    Historical Provider, MD    Family History Family History  Problem Relation Age of Onset  . Leukemia Father   . Thyroid disease Father   . Stomach cancer Paternal Grandfather   . Colon cancer Neg Hx     Social History Social History  Substance Use Topics  . Smoking status: Current Every Day Smoker    Packs/day: 1.50    Years: 50.00    Types:  Cigarettes  . Smokeless tobacco: Never Used  . Alcohol use No     Comment: glass of wine occasionally     Allergies   Chantix [varenicline] and Codeine   Review of Systems Review of Systems  Constitutional: Positive for chills and fever.  HENT: Positive for congestion. Negative for rhinorrhea and sore throat.   Eyes: Negative for visual disturbance.  Respiratory: Positive for cough and shortness of breath.   Cardiovascular: Positive for chest pain (During cough). Negative for leg swelling.  Gastrointestinal: Positive for nausea. Negative for abdominal pain, diarrhea and vomiting.  Genitourinary: Negative for dysuria and hematuria.  Musculoskeletal: Positive for gait problem. Negative for back pain.  Skin: Positive for rash (RLE).  Neurological: Positive for dizziness. Negative for headaches.  Hematological: Does not bruise/bleed easily.  Psychiatric/Behavioral: Negative for confusion.     Physical Exam Updated Vital Signs BP 162/73   Pulse  91   Temp 97.6 F (36.4 C)   Resp 18   Ht '5\' 3"'$  (1.6 m)   Wt 105 lb (47.6 kg)   SpO2 97%   BMI 18.60 kg/m   Physical Exam  Constitutional: She appears well-developed and well-nourished. No distress.  HENT:  Head: Normocephalic and atraumatic.  Eyes: Conjunctivae and EOM are normal. Pupils are equal, round, and reactive to light. No scleral icterus.  Neck: Neck supple.  Cardiovascular: Normal rate and regular rhythm.   Pulmonary/Chest: Effort normal. No respiratory distress. She has wheezes. She has no rales.  Bilateral wheezes.   Abdominal: Soft. Bowel sounds are normal. There is no tenderness.  Musculoskeletal: Normal range of motion. She exhibits no edema.  Neurological: She is alert.  Skin: Skin is warm and dry.  Bruising and ecchymosis to BLE.  Psychiatric: She has a normal mood and affect.  Nursing note and vitals reviewed.    ED Treatments / Results  DIAGNOSTIC STUDIES: Oxygen Saturation is 97% on RA, normal by my  interpretation.    COORDINATION OF CARE: 10:35 PM- Pt advised of plan for treatment and pt agrees.  Labs (all labs ordered are listed, but only abnormal results are displayed) Labs Reviewed  COMPREHENSIVE METABOLIC PANEL - Abnormal; Notable for the following:       Result Value   Sodium 113 (*)    Potassium 2.9 (*)    Chloride 76 (*)    Glucose, Bld 126 (*)    All other components within normal limits  CBC WITH DIFFERENTIAL/PLATELET - Abnormal; Notable for the following:    WBC 12.7 (*)    HCT 35.3 (*)    MCHC 37.1 (*)    Neutro Abs 9.8 (*)    All other components within normal limits  URINALYSIS, ROUTINE W REFLEX MICROSCOPIC    EKG  EKG Interpretation  Date/Time:  Tuesday September 20 2016 19:25:07 EST Ventricular Rate:  86 PR Interval:  146 QRS Duration: 90 QT Interval:  500 QTC Calculation: 598 R Axis:   83 Text Interpretation: Critical Test Result: Long QTc Normal sinus rhythm Biatrial enlargement Prolonged QT Abnormal ECG Confirmed by Breyon Blass  MD, Derrian Rodak [phone]) on 09/20/2016 7:45:32 PM       Radiology Dg Chest 2 View  Result Date: 09/20/2016 CLINICAL DATA:  Status post fall, with concern for chest injury. Initial encounter. EXAM: CHEST  2 VIEW COMPARISON:  Chest radiograph performed 04/03/2015, and CT of the chest performed 07/11/2016 FINDINGS: The lungs are well-aerated and clear. There is no evidence of focal opacification, pleural effusion or pneumothorax. Bilateral nipple shadows are noted. Peribronchial thickening is noted. The heart is normal in size; the mediastinal contour is within normal limits. No acute osseous abnormalities are seen. IMPRESSION: Peribronchial thickening noted. Lungs otherwise grossly clear. No displaced rib fractures identified. Electronically Signed   By: Garald Balding M.D.   On: 09/20/2016 20:12   Ct Head Wo Contrast  Result Date: 09/21/2016 CLINICAL DATA:  Multiple falls.  Impact to the posterior head. EXAM: CT HEAD WITHOUT  CONTRAST TECHNIQUE: Contiguous axial images were obtained from the base of the skull through the vertex without intravenous contrast. COMPARISON:  None. FINDINGS: Brain: No mass lesion, intraparenchymal hemorrhage or extra-axial collection. No evidence of acute cortical infarct. Brain parenchyma and CSF-containing spaces are normal for age. Vascular: Atherosclerotic calcification of the internal carotid arteries at the skullbase. Skull: Normal visualized skull base, calvarium and extracranial soft tissues. Sinuses/Orbits: No sinus fluid levels or advanced mucosal thickening. No  mastoid effusion. Normal orbits. IMPRESSION: Normal CT of the brain for age. Electronically Signed   By: Ulyses Jarred M.D.   On: 09/21/2016 00:36   Dg Hip Unilat W Or Wo Pelvis 2-3 Views Right  Result Date: 09/20/2016 CLINICAL DATA:  Fall with right hip pain.  Initial encounter. EXAM: DG HIP (WITH OR WITHOUT PELVIS) 2-3V RIGHT COMPARISON:  None. FINDINGS: There is no evidence of hip fracture or dislocation. No evidence of pelvic ring fracture or diastasis. Osteopenia and atherosclerosis. IMPRESSION: No acute finding. Electronically Signed   By: Monte Fantasia M.D.   On: 09/20/2016 20:08    Procedures Procedures (including critical care time)  Medications Ordered in ED Medications  0.9 %  sodium chloride infusion ( Intravenous New Bag/Given 09/21/16 0005)  albuterol (PROVENTIL) (2.5 MG/3ML) 0.083% nebulizer solution 5 mg (5 mg Nebulization Given 09/20/16 2206)  ipratropium-albuterol (DUONEB) 0.5-2.5 (3) MG/3ML nebulizer solution 3 mL (3 mLs Nebulization Given 09/21/16 0014)  sodium chloride 0.9 % bolus 500 mL (500 mLs Intravenous New Bag/Given 09/21/16 0006)  potassium chloride SA (K-DUR,KLOR-CON) CR tablet 40 mEq (40 mEq Oral Given 09/21/16 0046)     Initial Impression / Assessment and Plan / ED Course  I have reviewed the triage vital signs and the nursing notes.  Pertinent labs & imaging results that were available  during my care of the patient were reviewed by me and considered in my medical decision making (see chart for details).  Clinical Course    CRITICAL CARE Performed by: Lindsey Combs Total critical care time: 40 minutes Critical care time was exclusive of separately billable procedures and treating other patients. Critical care was necessary to treat or prevent imminent or life-threatening deterioration. Critical care was time spent personally by me on the following activities: development of treatment plan with patient and/or surrogate as well as nursing, discussions with consultants, evaluation of patient's response to treatment, examination of patient, obtaining history from patient or surrogate, ordering and performing treatments and interventions, ordering and review of laboratory studies, ordering and review of radiographic studies, pulse oximetry and re-evaluation of patient's condition.  Patient was significant hyponatremia. Also has hypokalemia. Mental status is normal so given 40 mEq oral potassium patient given a 500 mL bolus of normal saline. Continue normal saline as a drip. A chest x-ray negative for any pneumonia. Patient had she doing better on the normal saline. Patient will be admitted to step down unit.   Patient's x-ray workup occluding chest x-ray head CT and hit x-rays of her pelvis and hips without any acute findings from fall.   Final Clinical Impressions(s) / ED Diagnoses   Final diagnoses:  Hyponatremia  Hypokalemia    New Prescriptions New Prescriptions   No medications on file   I personally performed the services described in this documentation, which was scribed in my presence. The recorded information has been reviewed and is accurate.       Lindsey Sorrow, MD 09/21/16 [phone]

## 2016-09-20 NOTE — ED Triage Notes (Signed)
Per EMS reports called to home for fall. EMS states on arrival pt c/o SOB. Pt had mild expiratory wheezing, treated with neb en route. O2 sat in Triage 100%. Productive cough with white sputum, thick.

## 2016-09-20 NOTE — ED Triage Notes (Signed)
Pt reports edema to her R hip from the fall today.

## 2016-09-21 ENCOUNTER — Inpatient Hospital Stay (HOSPITAL_COMMUNITY): Payer: Medicare Other

## 2016-09-21 ENCOUNTER — Encounter (HOSPITAL_COMMUNITY): Payer: Self-pay | Admitting: Internal Medicine

## 2016-09-21 DIAGNOSIS — R9431 Abnormal electrocardiogram [ECG] [EKG]: Secondary | ICD-10-CM | POA: Diagnosis not present

## 2016-09-21 DIAGNOSIS — Z66 Do not resuscitate: Secondary | ICD-10-CM | POA: Diagnosis not present

## 2016-09-21 DIAGNOSIS — F419 Anxiety disorder, unspecified: Secondary | ICD-10-CM | POA: Diagnosis present

## 2016-09-21 DIAGNOSIS — E876 Hypokalemia: Secondary | ICD-10-CM | POA: Diagnosis present

## 2016-09-21 DIAGNOSIS — E7800 Pure hypercholesterolemia, unspecified: Secondary | ICD-10-CM | POA: Diagnosis present

## 2016-09-21 DIAGNOSIS — K219 Gastro-esophageal reflux disease without esophagitis: Secondary | ICD-10-CM | POA: Diagnosis present

## 2016-09-21 DIAGNOSIS — E871 Hypo-osmolality and hyponatremia: Secondary | ICD-10-CM | POA: Diagnosis present

## 2016-09-21 DIAGNOSIS — J441 Chronic obstructive pulmonary disease with (acute) exacerbation: Secondary | ICD-10-CM | POA: Diagnosis present

## 2016-09-21 DIAGNOSIS — E440 Moderate protein-calorie malnutrition: Secondary | ICD-10-CM | POA: Diagnosis present

## 2016-09-21 DIAGNOSIS — F17210 Nicotine dependence, cigarettes, uncomplicated: Secondary | ICD-10-CM | POA: Diagnosis present

## 2016-09-21 DIAGNOSIS — Z888 Allergy status to other drugs, medicaments and biological substances status: Secondary | ICD-10-CM | POA: Diagnosis not present

## 2016-09-21 DIAGNOSIS — I10 Essential (primary) hypertension: Secondary | ICD-10-CM | POA: Diagnosis present

## 2016-09-21 DIAGNOSIS — Z681 Body mass index (BMI) 19 or less, adult: Secondary | ICD-10-CM | POA: Diagnosis not present

## 2016-09-21 DIAGNOSIS — R42 Dizziness and giddiness: Secondary | ICD-10-CM | POA: Diagnosis present

## 2016-09-21 LAB — CBC WITH DIFFERENTIAL/PLATELET
BASOS ABS: 0 10*3/uL (ref 0.0–0.1)
Basophils Relative: 0 %
Eosinophils Absolute: 0 10*3/uL (ref 0.0–0.7)
Eosinophils Relative: 0 %
HEMATOCRIT: 35.3 % — AB (ref 36.0–46.0)
Hemoglobin: 13.1 g/dL (ref 12.0–15.0)
LYMPHS ABS: 1.9 10*3/uL (ref 0.7–4.0)
LYMPHS PCT: 15 %
MCH: 31.3 pg (ref 26.0–34.0)
MCHC: 37.1 g/dL — ABNORMAL HIGH (ref 30.0–36.0)
MCV: 84.4 fL (ref 78.0–100.0)
MONO ABS: 1 10*3/uL (ref 0.1–1.0)
Monocytes Relative: 8 %
NEUTROS ABS: 9.8 10*3/uL — AB (ref 1.7–7.7)
Neutrophils Relative %: 77 %
Platelets: 323 10*3/uL (ref 150–400)
RBC: 4.18 MIL/uL (ref 3.87–5.11)
RDW: 13.5 % (ref 11.5–15.5)
WBC: 12.7 10*3/uL — ABNORMAL HIGH (ref 4.0–10.5)

## 2016-09-21 LAB — COMPREHENSIVE METABOLIC PANEL
ALBUMIN: 4.3 g/dL (ref 3.5–5.0)
ALT: 22 U/L (ref 14–54)
AST: 36 U/L (ref 15–41)
Alkaline Phosphatase: 42 U/L (ref 38–126)
Anion gap: 11 (ref 5–15)
BILIRUBIN TOTAL: 1.2 mg/dL (ref 0.3–1.2)
BUN: 11 mg/dL (ref 6–20)
CHLORIDE: 76 mmol/L — AB (ref 101–111)
CO2: 26 mmol/L (ref 22–32)
Calcium: 10 mg/dL (ref 8.9–10.3)
Creatinine, Ser: 0.56 mg/dL (ref 0.44–1.00)
GFR calc Af Amer: >60 mL/min (ref >60)
GFR calc non Af Amer: >60 mL/min (ref >60)
GLUCOSE: 126 mg/dL — AB (ref 65–99)
POTASSIUM: 2.9 mmol/L — AB (ref 3.5–5.1)
SODIUM: 113 mmol/L — AB (ref 135–145)
TOTAL PROTEIN: 7 g/dL (ref 6.5–8.1)

## 2016-09-21 LAB — BASIC METABOLIC PANEL
Anion gap: 9 (ref 5–15)
BUN: 10 mg/dL (ref 6–20)
CALCIUM: 9.2 mg/dL (ref 8.9–10.3)
CHLORIDE: 80 mmol/L — AB (ref 101–111)
CO2: 25 mmol/L (ref 22–32)
CREATININE: 0.65 mg/dL (ref 0.44–1.00)
GFR calc Af Amer: >60 mL/min (ref >60)
GFR calc non Af Amer: >60 mL/min (ref >60)
Glucose, Bld: 102 mg/dL — ABNORMAL HIGH (ref 65–99)
Potassium: 3.7 mmol/L (ref 3.5–5.1)
SODIUM: 114 mmol/L — AB (ref 135–145)

## 2016-09-21 LAB — URINALYSIS, ROUTINE W REFLEX MICROSCOPIC
BILIRUBIN URINE: NEGATIVE
Glucose, UA: NEGATIVE mg/dL
KETONES UR: 5 mg/dL — AB
LEUKOCYTES UA: NEGATIVE
Nitrite: NEGATIVE
Protein, ur: 100 mg/dL — AB
Specific Gravity, Urine: 1.013 (ref 1.005–1.030)
pH: 6 (ref 5.0–8.0)

## 2016-09-21 LAB — PHOSPHORUS: PHOSPHORUS: 2.9 mg/dL (ref 2.5–4.6)

## 2016-09-21 LAB — ECHOCARDIOGRAM COMPLETE
HEIGHTINCHES: 63 in
WEIGHTICAEL: 1608.48 [oz_av]

## 2016-09-21 LAB — MAGNESIUM: MAGNESIUM: 1.6 mg/dL — AB (ref 1.7–2.4)

## 2016-09-21 LAB — SODIUM, URINE, RANDOM: Sodium, Ur: <10 mmol/L

## 2016-09-21 LAB — OSMOLALITY, URINE: OSMOLALITY UR: 299 mosm/kg — AB (ref 300–900)

## 2016-09-21 MED ORDER — HYDROCODONE-ACETAMINOPHEN 5-325 MG PO TABS
1.0000 | ORAL_TABLET | ORAL | Status: DC | PRN
  Administered 2016-09-22 – 2016-09-23 (×3): 1 via ORAL
  Filled 2016-09-21 (×3): qty 1

## 2016-09-21 MED ORDER — ONDANSETRON HCL 4 MG/2ML IJ SOLN
4.0000 mg | Freq: Four times a day (QID) | INTRAMUSCULAR | Status: DC | PRN
  Administered 2016-09-21 (×2): 4 mg via INTRAVENOUS
  Filled 2016-09-21 (×2): qty 2

## 2016-09-21 MED ORDER — IPRATROPIUM-ALBUTEROL 0.5-2.5 (3) MG/3ML IN SOLN
3.0000 mL | Freq: Four times a day (QID) | RESPIRATORY_TRACT | Status: DC
  Administered 2016-09-21: 3 mL via RESPIRATORY_TRACT
  Filled 2016-09-21: qty 3

## 2016-09-21 MED ORDER — ONDANSETRON HCL 4 MG/2ML IJ SOLN
4.0000 mg | Freq: Once | INTRAMUSCULAR | Status: AC
  Administered 2016-09-21: 4 mg via INTRAVENOUS
  Filled 2016-09-21: qty 2

## 2016-09-21 MED ORDER — ALBUTEROL SULFATE (2.5 MG/3ML) 0.083% IN NEBU
2.5000 mg | INHALATION_SOLUTION | Freq: Three times a day (TID) | RESPIRATORY_TRACT | Status: DC
  Administered 2016-09-21 – 2016-09-23 (×6): 2.5 mg via RESPIRATORY_TRACT
  Filled 2016-09-21 (×7): qty 3

## 2016-09-21 MED ORDER — ENOXAPARIN SODIUM 40 MG/0.4ML ~~LOC~~ SOLN
40.0000 mg | SUBCUTANEOUS | Status: DC
  Administered 2016-09-21 – 2016-09-23 (×3): 40 mg via SUBCUTANEOUS
  Filled 2016-09-21 (×3): qty 0.4

## 2016-09-21 MED ORDER — SODIUM CHLORIDE 0.9 % IV SOLN
INTRAVENOUS | Status: DC
  Administered 2016-09-21 – 2016-09-22 (×2): via INTRAVENOUS

## 2016-09-21 MED ORDER — ENSURE ENLIVE PO LIQD
237.0000 mL | Freq: Two times a day (BID) | ORAL | Status: DC
  Administered 2016-09-21 – 2016-09-23 (×5): 237 mL via ORAL

## 2016-09-21 MED ORDER — LEVOFLOXACIN IN D5W 500 MG/100ML IV SOLN
500.0000 mg | Freq: Once | INTRAVENOUS | Status: AC
  Administered 2016-09-21: 500 mg via INTRAVENOUS
  Filled 2016-09-21: qty 100

## 2016-09-21 MED ORDER — ATORVASTATIN CALCIUM 20 MG PO TABS
20.0000 mg | ORAL_TABLET | Freq: Every day | ORAL | Status: DC
  Administered 2016-09-21 – 2016-09-23 (×3): 20 mg via ORAL
  Filled 2016-09-21 (×3): qty 1

## 2016-09-21 MED ORDER — POTASSIUM CHLORIDE CRYS ER 20 MEQ PO TBCR
40.0000 meq | EXTENDED_RELEASE_TABLET | Freq: Once | ORAL | Status: AC
  Administered 2016-09-21: 40 meq via ORAL
  Filled 2016-09-21: qty 2

## 2016-09-21 MED ORDER — LEVOFLOXACIN IN D5W 500 MG/100ML IV SOLN
500.0000 mg | INTRAVENOUS | Status: DC
  Administered 2016-09-23: 500 mg via INTRAVENOUS
  Filled 2016-09-21: qty 100

## 2016-09-21 MED ORDER — MIRABEGRON ER 25 MG PO TB24
25.0000 mg | ORAL_TABLET | Freq: Every day | ORAL | Status: DC
  Administered 2016-09-21 – 2016-09-23 (×3): 25 mg via ORAL
  Filled 2016-09-21 (×3): qty 1

## 2016-09-21 MED ORDER — TIOTROPIUM BROMIDE MONOHYDRATE 18 MCG IN CAPS
18.0000 ug | ORAL_CAPSULE | Freq: Every day | RESPIRATORY_TRACT | Status: DC
  Administered 2016-09-22 – 2016-09-23 (×2): 18 ug via RESPIRATORY_TRACT
  Filled 2016-09-21: qty 5

## 2016-09-21 MED ORDER — MAGNESIUM SULFATE 2 GM/50ML IV SOLN
2.0000 g | Freq: Once | INTRAVENOUS | Status: AC
  Administered 2016-09-21: 2 g via INTRAVENOUS
  Filled 2016-09-21: qty 50

## 2016-09-21 MED ORDER — LORAZEPAM 0.5 MG PO TABS
0.5000 mg | ORAL_TABLET | Freq: Three times a day (TID) | ORAL | Status: DC | PRN
  Administered 2016-09-21 – 2016-09-23 (×5): 0.5 mg via ORAL
  Filled 2016-09-21 (×5): qty 1

## 2016-09-21 MED ORDER — ALBUTEROL SULFATE (2.5 MG/3ML) 0.083% IN NEBU: 2.5000 mg | INHALATION_SOLUTION | RESPIRATORY_TRACT | Status: DC | PRN

## 2016-09-21 MED ORDER — ACETAMINOPHEN 500 MG PO TABS: 500.0000 mg | ORAL_TABLET | Freq: Four times a day (QID) | ORAL | Status: DC | PRN

## 2016-09-21 MED ORDER — MAGNESIUM SULFATE 2 GM/50ML IV SOLN
2.0000 g | Freq: Once | INTRAVENOUS | Status: AC
Start: 2016-09-21 — End: 2016-09-21
  Administered 2016-09-21: 2 g via INTRAVENOUS
  Filled 2016-09-21: qty 50

## 2016-09-21 MED ORDER — NICOTINE 14 MG/24HR TD PT24
14.0000 mg | MEDICATED_PATCH | Freq: Every day | TRANSDERMAL | Status: DC
  Administered 2016-09-21 – 2016-09-23 (×3): 14 mg via TRANSDERMAL
  Filled 2016-09-21 (×5): qty 1

## 2016-09-21 MED ORDER — FAMOTIDINE 20 MG PO TABS
20.0000 mg | ORAL_TABLET | Freq: Two times a day (BID) | ORAL | Status: DC
  Administered 2016-09-21 – 2016-09-23 (×5): 20 mg via ORAL
  Filled 2016-09-21 (×5): qty 1

## 2016-09-21 NOTE — Progress Notes (Signed)
CRITICAL VALUE ALERT  Critical value received:  Na 114  Date of notification: 09/21/2016   Time of notification:  1220  Critical value read back: Yes  Nurse who received alert: Vista Deck  MD notified (1st page): Dr. Jerilee Hoh  Time of first page:  1225

## 2016-09-21 NOTE — H&P (Addendum)
History and Physical    Lindsey Combs:[id number] DOB: 1946-07-21 DOA: 09/20/2016  PCP: [name], MD   Patient coming from: Home.  Chief Complaint: Shortness of breath.  HPI: Lindsey Combs is a 70 y.o. female with medical history significant of anxiety, COPD, arthritis, diverticulosis, GERD, history of UTI, hypercholesterolemia, hypertension, hyponatremia, peripheral neuropathy, tobacco abuse who came to the emergency department after she called EMS to her home secondary to a fall. Patient was also found to be short of breath and with expiratory wheezing and was given a nebulizer treatment on route to the hospital. She complains that she has been having whitish and yellowish thick sputum for several days along with fever, night sweats and chills. She denies chest pain, palpitations, dizziness, pitting edema lower extremities, abdominal pain, nausea, emesis, diarrhea, melena or hematochezia. She denies GU symptoms.  ED Course: The patient received albuterol and ipratropium neb treatments. Her workup shows WBC of 12.7, hemoglobin level of 13.1 g/dL, platelets of 323 Sodium was 113, potassium 2.9, chloride 76 and bicarbonate was 26 mmol/L. Her BUN was 11, creatinine 0.56 and glucose 126 mg/dL.  Imaging: Her chest radiograph showed peribronchial thickening. Right hip x-ray did not show evidence of fracture or dislocation. CT scan of the head did not show any acute abnormalities.  Review of Systems: As per HPI otherwise 10 point review of systems negative.    Past Medical History:  Diagnosis Date  . Acute respiratory failure with hypoxia ([place])   . Anxiety   . COPD (chronic obstructive pulmonary disease) ([place])   . DDD (degenerative disc disease), lumbar   . Degenerative joint disease of spine   . Diverticulosis   . GERD (gastroesophageal reflux disease)   . H. pylori infection    treated 02/2013.  [name] History of UTI   . Hypercholesterolemia   . Hypertension   . Hyponatremia   .  Peripheral neuropathy ([place])   . Shortness of breath    with exertion  . Tobacco abuse     Past Surgical History:  Procedure Laterality Date  . ABDOMINAL EXPLORATION SURGERY     age 58, large ovarian cyst  . BIOPSY N/A 03/01/2013   NFA:OZHY hiatal hernia; otherwise, normal examination/ Status post biopsies as described above. SB bx negative for Celiac. +h/pylori  . CATARACT EXTRACTION W/ INTRAOCULAR LENS  IMPLANT, BILATERAL    . COLONOSCOPY  10/16/09   Jenkins:3 polypsin the sigmoid colon/polyps in the descending colon and the rectum/scattered diverticulum. hyperplastic  . COLONOSCOPY WITH ESOPHAGOGASTRODUODENOSCOPY (EGD) N/A 03/01/2013   QMV:HQIONGEX colonic polyps - treated/removed as described above. Colonic diverticulosis. Hyperplastic. Next TCS 02/2018.  [name] DIRECT LARYNGOSCOPY N/A 06/08/2016   Procedure: DIRECT LARYNGOSCOPY AND BIOPSY;  Surgeon: Leta Baptist, MD;  Location: [facility] OR;  Service: ENT;  Laterality: N/A;  . PARTIAL HYSTERECTOMY     age 70     reports that she has been smoking Cigarettes.  She has a 75.00 pack-year smoking history. She has never used smokeless tobacco. She reports that she does not drink alcohol or use drugs.  Allergies  Allergen Reactions  . Chantix [Varenicline] Other (See Comments)    Mental status changes  . Codeine Itching    Family History  Problem Relation Age of Onset  . Leukemia Father   . Thyroid disease Father   . Stomach cancer Paternal Grandfather   . Colon cancer Neg Hx     Prior to Admission medications   Medication Sig Start Date End Date Taking? Authorizing  Provider  acetaminophen (TYLENOL) 500 MG tablet Take 500 mg by mouth every 6 (six) hours as needed for mild pain.    Historical Provider, MD  albuterol (PROAIR HFA) 108 (90 BASE) MCG/ACT inhaler Inhale 2 puffs into the lungs every 4 (four) hours as needed for wheezing or shortness of breath. 04/06/15   Radene Gunning, NP  atorvastatin (LIPITOR) 20 MG tablet Take 20 mg by mouth daily.     Historical Provider, MD  hydrochlorothiazide (MICROZIDE) 12.5 MG capsule Take 12.5 mg by mouth daily.    Historical Provider, MD  HYDROcodone-acetaminophen (NORCO/VICODIN) 5-325 MG tablet  07/05/16   Historical Provider, MD  LORazepam (ATIVAN) 0.5 MG tablet  06/30/16   Historical Provider, MD  LORazepam (ATIVAN) 1 MG tablet Take 0.5 mg by mouth 3 (three) times daily as needed for anxiety.     Historical Provider, MD  MYRBETRIQ 25 MG TB24 tablet  07/05/16   Historical Provider, MD  nystatin (MYCOSTATIN) 100000 UNIT/ML suspension  05/26/16   Historical Provider, MD  promethazine (PHENERGAN) 25 MG tablet  06/16/16   Historical Provider, MD  ranitidine (ZANTAC) 150 MG tablet Take 150 mg by mouth at bedtime.    Historical Provider, MD  tiotropium (SPIRIVA) 18 MCG inhalation capsule Place 18 mcg into inhaler and inhale daily.    Historical Provider, MD    Physical Exam:  Constitutional: NAD, calm, comfortable Vitals:   09/20/16 2207 09/21/16 0015 09/21/16 0130 09/21/16 0250  BP:   150/73 180/66  Pulse:   87 78  Resp:   18 26  Temp:      SpO2: 97% 95% 98% 96%  Weight:      Height:       Eyes: PERRL, lids and conjunctivae normal ENMT: Mucous membranes and lips are dry. Posterior pharynx clear of any exudate or lesions. Neck: normal, supple, no masses, no thyromegaly Respiratory: Mild rhonchi and wheezing, no crackles. Normal respiratory effort. No accessory muscle use.  Cardiovascular: Regular rate and rhythm, no murmurs / rubs / gallops. No extremity edema. 2+ pedal pulses. No carotid bruits.  Abdomen: Soft, no tenderness, no masses palpated. No hepatosplenomegaly. Bowel sounds positive.  Musculoskeletal: Positive clubbing, no cyanosis.Good ROM, no contractures. Normal muscle tone.  Skin: No rashes, lesions, ulcers. Neurologic: CN 2-12 grossly intact. Sensation intact, DTR normal. Strength 5/5 in all 4.  Psychiatric: Normal judgment and insight. Alert and oriented x 4. Normal mood.    Labs  on Admission: I have personally reviewed following labs and imaging studies  CBC:  Recent Labs Lab 09/20/16 2350  WBC 12.7*  NEUTROABS 9.8*  HGB 13.1  HCT 35.3*  MCV 84.4  PLT 161   Basic Metabolic Panel:  Recent Labs Lab 09/20/16 2350  NA 113*  K 2.9*  CL 76*  CO2 26  GLUCOSE 126*  BUN 11  CREATININE 0.56  CALCIUM 10.0  MG 1.6*  PHOS 2.9   GFR: Estimated Creatinine Clearance: 47.8 mL/min (by C-G formula based on SCr of 0.56 mg/dL). Liver Function Tests:  Recent Labs Lab 09/20/16 2350  AST 36  ALT 22  ALKPHOS 42  BILITOT 1.2  PROT 7.0  ALBUMIN 4.3   No results for input(s): LIPASE, AMYLASE in the last 168 hours. No results for input(s): AMMONIA in the last 168 hours. Coagulation Profile: No results for input(s): INR, PROTIME in the last 168 hours. Cardiac Enzymes: No results for input(s): CKTOTAL, CKMB, CKMBINDEX, TROPONINI in the last 168 hours. BNP (last 3 results) No results for  input(s): PROBNP in the last 8760 hours. HbA1C: No results for input(s): HGBA1C in the last 72 hours. CBG: No results for input(s): GLUCAP in the last 168 hours. Lipid Profile: No results for input(s): CHOL, HDL, LDLCALC, TRIG, CHOLHDL, LDLDIRECT in the last 72 hours. Thyroid Function Tests: No results for input(s): TSH, T4TOTAL, FREET4, T3FREE, THYROIDAB in the last 72 hours. Anemia Panel: No results for input(s): VITAMINB12, FOLATE, FERRITIN, TIBC, IRON, RETICCTPCT in the last 72 hours. Urine analysis:    Component Value Date/Time   COLORURINE YELLOW 09/21/2016 0246   APPEARANCEUR CLEAR 09/21/2016 0246   LABSPEC 1.013 09/21/2016 0246   PHURINE 6.0 09/21/2016 0246   GLUCOSEU NEGATIVE 09/21/2016 0246   HGBUR MODERATE (A) 09/21/2016 0246   BILIRUBINUR NEGATIVE 09/21/2016 0246   KETONESUR 5 (A) 09/21/2016 0246   PROTEINUR 100 (A) 09/21/2016 0246   UROBILINOGEN 0.2 04/04/2015 1918   NITRITE NEGATIVE 09/21/2016 0246   LEUKOCYTESUR NEGATIVE 09/21/2016 0246    Radiological Exams on Admission: Dg Chest 2 View  Result Date: 09/20/2016 CLINICAL DATA:  Status post fall, with concern for chest injury. Initial encounter. EXAM: CHEST  2 VIEW COMPARISON:  Chest radiograph performed 04/03/2015, and CT of the chest performed 07/11/2016 FINDINGS: The lungs are well-aerated and clear. There is no evidence of focal opacification, pleural effusion or pneumothorax. Bilateral nipple shadows are noted. Peribronchial thickening is noted. The heart is normal in size; the mediastinal contour is within normal limits. No acute osseous abnormalities are seen. IMPRESSION: Peribronchial thickening noted. Lungs otherwise grossly clear. No displaced rib fractures identified. Electronically Signed   By: Garald Balding M.D.   On: 09/20/2016 20:12   Ct Head Wo Contrast  Result Date: 09/21/2016 CLINICAL DATA:  Multiple falls.  Impact to the posterior head. EXAM: CT HEAD WITHOUT CONTRAST TECHNIQUE: Contiguous axial images were obtained from the base of the skull through the vertex without intravenous contrast. COMPARISON:  None. FINDINGS: Brain: No mass lesion, intraparenchymal hemorrhage or extra-axial collection. No evidence of acute cortical infarct. Brain parenchyma and CSF-containing spaces are normal for age. Vascular: Atherosclerotic calcification of the internal carotid arteries at the skullbase. Skull: Normal visualized skull base, calvarium and extracranial soft tissues. Sinuses/Orbits: No sinus fluid levels or advanced mucosal thickening. No mastoid effusion. Normal orbits. IMPRESSION: Normal CT of the brain for age. Electronically Signed   By: Ulyses Jarred M.D.   On: 09/21/2016 00:36   Dg Hip Unilat W Or Wo Pelvis 2-3 Views Right  Result Date: 09/20/2016 CLINICAL DATA:  Fall with right hip pain.  Initial encounter. EXAM: DG HIP (WITH OR WITHOUT PELVIS) 2-3V RIGHT COMPARISON:  None. FINDINGS: There is no evidence of hip fracture or dislocation. No evidence of pelvic ring  fracture or diastasis. Osteopenia and atherosclerosis. IMPRESSION: No acute finding. Electronically Signed   By: Monte Fantasia M.D.   On: 09/20/2016 20:08    EKG: Independently reviewed. Vent. rate 86 BPM PR interval 146 ms QRS duration 90 ms QT/QTc 500/598 ms P-R-T axes 82 83 83 Normal sinus rhythm Biatrial enlargement Prolonged QT Abnormal ECG  Assessment/Plan Principal Problem:   Hyponatremia Admit to telemetry/inpatient. Continue normal saline infusion. Discontinue hydrochlorothiazide. Check urine sodium and osmolality. Follow-up sodium level.  Active Problems:   Chronic obstructive pulmonary disease with acute exacerbation (HCC) Continue supplemental oxygen. Continue bronchodilators every 6 hours and as needed. Will add Levaquin IVPB given her recent low-grade fevers.    GERD (gastroesophageal reflux disease) Pantoprazole 40 mg by mouth daily.    Tobacco dependence  Nicotine replacement therapy ordered. Will offer information about smoking cessation.    Hypomagnesemia Replaced.     DVT prophylaxis: Lovenox SQ. Code Status: Full code. Family Communication:  Disposition Plan: Admit for electrolyte replacement and COPD treatment. Consults called:  Admission status: Inpatient/telemetry.   Reubin Milan MD [facility] Pager [phone].  If 7PM-7AM, please contact night-coverage [email or web address] Password [email or web address]  09/21/2016, 3:22 AM

## 2016-09-21 NOTE — ED Notes (Signed)
CRITICAL VALUE ALERT  Critical value received:  Sodium 113  Date of notification:  09/21/2016  Time of notification:  0030  Critical value read back:Yes.    Nurse who received alert:  Fabio Neighbors RN  MD notified (1st page):  Dr. Rogene Houston   Time of first page:  0031  MD notified (2nd page):  Time of second page:  Responding MD:    Time MD responded:

## 2016-09-21 NOTE — Progress Notes (Signed)
Pharmacy Antibiotic Note  Lindsey Combs is a 70 y.o. female admitted on 09/20/2016 with fever/COPD exacerbation.  Pharmacy has been consulted for Levaquin dosing.  Plan: Levaquin '500mg'$  IV q48hours  F/U cultures and monitor clinical progress Deescalate therapy as indicated  Height: '5\' 3"'$  (160 cm) Weight: 100 lb 8.5 oz (45.6 kg) IBW/kg (Calculated) : 52.4  Temp (24hrs), Avg:98.4 F (36.9 C), Min:97.6 F (36.4 C), Max:99.2 F (37.3 C)   Recent Labs Lab 09/20/16 2350  WBC 12.7*  CREATININE 0.56    Estimated Creatinine Clearance: 45.8 mL/min (by C-G formula based on SCr of 0.56 mg/dL).    Allergies  Allergen Reactions  . Chantix [Varenicline] Other (See Comments)    Mental status changes  . Codeine Itching    Antimicrobials this admission: levaquin 12/27 >>   Microbiology results: No cultures  Thank you for allowing pharmacy to be a part of this patient's care.  Isac Sarna, BS [place], [place] Clinical Pharmacist Pager [phone] 09/21/2016 8:43 AM

## 2016-09-21 NOTE — Progress Notes (Signed)
ANTIBIOTIC CONSULT NOTE-Preliminary  Pharmacy Consult for Levofloxacin Indication: COPD exacerbation with fever  Allergies  Allergen Reactions  . Chantix [Varenicline] Other (See Comments)    Mental status changes  . Codeine Itching    Patient Measurements: Height: '5\' 3"'$  (160 cm) Weight: 105 lb (47.6 kg) IBW/kg (Calculated) : 52.4  Vital Signs: Temp: 97.6 F (36.4 C) (12/26 1917) BP: 133/54 (12/27 0340) Pulse Rate: 81 (12/27 0340)  Labs:  Recent Labs  09/20/16 2350  WBC 12.7*  HGB 13.1  PLT 323  CREATININE 0.56    Estimated Creatinine Clearance: 47.8 mL/min (by C-G formula based on SCr of 0.56 mg/dL).  No results for input(s): VANCOTROUGH, VANCOPEAK, VANCORANDOM, GENTTROUGH, GENTPEAK, GENTRANDOM, TOBRATROUGH, TOBRAPEAK, TOBRARND, AMIKACINPEAK, AMIKACINTROU, AMIKACIN in the last 72 hours.   Microbiology: No results found for this or any previous visit (from the past 720 hour(s)).  Medical History: Past Medical History:  Diagnosis Date  . Acute respiratory failure with hypoxia ([place])   . Anxiety   . COPD (chronic obstructive pulmonary disease) ([place])   . DDD (degenerative disc disease), lumbar   . Degenerative joint disease of spine   . Diverticulosis   . GERD (gastroesophageal reflux disease)   . H. pylori infection    treated 02/2013.  [name] History of UTI   . Hypercholesterolemia   . Hypertension   . Hyponatremia   . Peripheral neuropathy ([place])   . Shortness of breath    with exertion  . Tobacco abuse     Medications:   Assessment: 70 yo female with hx of COPD seen in the ED for a sudden onset of dizziness and fall. Pt reports SOB, productive sputum, fever and has elevated WBCs. Chest x-ray is negative for pneumonia. Levofloxacin ordered empirically.  Goal of Therapy: Eradicated infection  Plan:  Preliminary review of pertinent patient information completed.  Protocol will be initiated with a one-time dose of levofloxacin 500 mg IV.  Forestine Na  clinical pharmacist will complete review during morning rounds to assess patient and finalize treatment regimen.  Norberto Sorenson, [facility] 09/21/2016,3:42 AM

## 2016-09-21 NOTE — Progress Notes (Addendum)
Initial Nutrition Assessment  DOCUMENTATION CODES:  Underweight, Non-severe (moderate) malnutrition in context of chronic illness   Pt meets criteria for MODERATE MALNUTRITION in the context of chronic illness as evidenced by moderate muscle/fat wasting and an estimated intake that has met </= 75% of needs for >/= 1 month.   INTERVENTION:  Ensure Enlive po TID, each supplement provides 350 kcal and 20 grams of protein  Meal requests  Given pt's progressive wt loss and reported severe anorexia, potentially would benefit from an appetite stimulant.   NUTRITION DIAGNOSIS:  Inadequate oral intake related to catabolic illness, poor appetite, nausea as evidenced by Report of "maybe" consuming 1 meal per day  GOAL:  Patient will meet greater than or equal to 90% of their needs  MONITOR:  PO intake, Supplement acceptance, Labs, Weight trends, I & O's  REASON FOR ASSESSMENT:  Consult COPD Protocol  ASSESSMENT:  70 y/o female PMHx anxiety, COPD, Arthritis, GERD, diverticulosis,  HTN, tobacco abuse. Presented to ED after fallint at home. Has had SOB, fever, night chills w/ white/yellow sputum for several days. Admitted for COPD exacerbation.   Pt is confused, she was trying to turn on the TV with her phone.  Unsure if cognitively she is at her baseline and of accuracy of reported information.   She says she has been eating extremely poorly at home. She says she "maybe" will eat 1 meal a day. Her main barriers to poor PO intake are nausea and poor appetite. She doesn't get hungry. She does not take any vitamins/mineral supplements or drink any nutritional beverages.   She currently still is suffering from nausea and poor appetite. She says when she lifted the lid off her breakfast tray and smelled the food, she immediately became nauseated and couldn't eat.   Her breakfast tray is seen untouched at bedside. While RD present, lunch arrived and after seeing what food was for lunch, she stated  she couldn't eat any if it.   They only items the patient is requesting are cold cereal, Sprite and Ensure. Appreciated dietary retrieving cereal for patient.   Pt says her UBW is in the 130's, though does not appear to have weighed this much in years. Looks to have lost another 5 lbs in the last 2.5 months.   Pt has a 75 pack year smoking history and looks to still be smoking. Malnutrition believed to be secondary to   typical anorexia and weight loss associated with severe/advanced COPD.    Pt gets off topic extremely easy and kept speaking about history from >5 years back. Had to redirect several times.   Physical exam: Appears much older than her age. Moderate orbital/tricep fat wasting. Moderate wasting of temporalis, interosseous and lower body. Mild muscle wasting of clavicular musculature.   Medications: Pepcid, IV abx, Zofran, IVF Labs: Albumin: 4.3, WBC:12.7   Recent Labs Lab 09/20/16 2350 09/21/16 1110  NA 113* 114*  K 2.9* 3.7  CL 76* 80*  CO2 26 25  BUN 11 10  CREATININE 0.56 0.65  CALCIUM 10.0 9.2  MG 1.6*  --   PHOS 2.9  --   GLUCOSE 126* 102*    Diet Order:  Diet Heart Room service appropriate? Yes; Fluid consistency: Thin  Skin:  Reviewed, no issues  Last BM:  12/22  Height:  Ht Readings from Last 1 Encounters:  09/21/16 '5\' 3"'  (1.6 m)   Weight:  Wt Readings from Last 1 Encounters:  09/21/16 100 lb 8.5 oz (45.6 kg)  Wt Readings from Last 10 Encounters:  09/21/16 100 lb 8.5 oz (45.6 kg)  07/06/16 105 lb 6.4 oz (47.8 kg)  06/27/16 104 lb 9.6 oz (47.4 kg)  06/08/16 106 lb (48.1 kg)  06/01/16 106 lb 3.2 oz (48.2 kg)  04/03/15 118 lb 3.2 oz (53.6 kg)  04/19/13 123 lb 9.6 oz (56.1 kg)  02/19/13 126 lb (57.2 kg)  04/26/12 130 lb (59 kg)   Ideal Body Weight:  52.27 kg  BMI:  Body mass index is 17.81 kg/m.  Estimated Nutritional Needs:  Kcal:  1600-1800 kcals (35-40 kcal/kg bw) Protein:  60-70 g Pro (1.3-1.5 g/kg bw) Fluid:  1.6-1.8 L  fluid  EDUCATION NEEDS:   No education needs identified at this time  Burtis Junes RD, LDN, [place] Clinical Nutrition Pager: [phone] 09/21/2016 12:51 PM

## 2016-09-21 NOTE — Progress Notes (Addendum)
Patient seen and examined. Database reviewed. No family at bedside. Patient admitted for SOB, found to have a COPD exacerbation as well as severe hyponatremia with a Na of 113 on admission. Last documented Na was 133 in 9/17. Plan to continue IVF, recheck Na levels in am. Continue nebs/levaquin for COPD. See no need for steroids at this point as she is not actively wheezing. Will continue to follow.  Domingo Mend, MD [facility] Pager: [phone]

## 2016-09-21 NOTE — Progress Notes (Signed)
*  PRELIMINARY RESULTS* Echocardiogram 2D Echocardiogram has been performed.  Leavy Cella 09/21/2016, 10:57 AM

## 2016-09-22 DIAGNOSIS — E440 Moderate protein-calorie malnutrition: Secondary | ICD-10-CM | POA: Insufficient documentation

## 2016-09-22 LAB — CBC WITH DIFFERENTIAL/PLATELET
Basophils Absolute: 0 10*3/uL (ref 0.0–0.1)
Basophils Relative: 0 %
Eosinophils Absolute: 0 10*3/uL (ref 0.0–0.7)
Eosinophils Relative: 0 %
HEMATOCRIT: 32 % — AB (ref 36.0–46.0)
HEMOGLOBIN: 11.6 g/dL — AB (ref 12.0–15.0)
LYMPHS ABS: 1.6 10*3/uL (ref 0.7–4.0)
LYMPHS PCT: 17 %
MCH: 31.7 pg (ref 26.0–34.0)
MCHC: 36.3 g/dL — AB (ref 30.0–36.0)
MCV: 87.4 fL (ref 78.0–100.0)
MONO ABS: 1.1 10*3/uL — AB (ref 0.1–1.0)
MONOS PCT: 12 %
NEUTROS ABS: 6.6 10*3/uL (ref 1.7–7.7)
NEUTROS PCT: 71 %
Platelets: 314 10*3/uL (ref 150–400)
RBC: 3.66 MIL/uL — ABNORMAL LOW (ref 3.87–5.11)
RDW: 14 % (ref 11.5–15.5)
WBC: 9.3 10*3/uL (ref 4.0–10.5)

## 2016-09-22 LAB — BASIC METABOLIC PANEL
Anion gap: 5 (ref 5–15)
BUN: 6 mg/dL (ref 6–20)
CALCIUM: 8.5 mg/dL — AB (ref 8.9–10.3)
CHLORIDE: 94 mmol/L — AB (ref 101–111)
CO2: 26 mmol/L (ref 22–32)
CREATININE: 0.58 mg/dL (ref 0.44–1.00)
GFR calc non Af Amer: >60 mL/min (ref >60)
Glucose, Bld: 90 mg/dL (ref 65–99)
Potassium: 2.7 mmol/L — CL (ref 3.5–5.1)
Sodium: 125 mmol/L — ABNORMAL LOW (ref 135–145)

## 2016-09-22 LAB — PHOSPHORUS: PHOSPHORUS: 1.9 mg/dL — AB (ref 2.5–4.6)

## 2016-09-22 LAB — MAGNESIUM: Magnesium: 2.2 mg/dL (ref 1.7–2.4)

## 2016-09-22 MED ORDER — POTASSIUM CHLORIDE IN NACL 40-0.9 MEQ/L-% IV SOLN
INTRAVENOUS | Status: DC
  Administered 2016-09-22 (×2): 75 mL/h via INTRAVENOUS

## 2016-09-22 MED ORDER — POTASSIUM CHLORIDE 20 MEQ PO PACK
40.0000 meq | PACK | Freq: Two times a day (BID) | ORAL | Status: AC
  Administered 2016-09-22 (×2): 40 meq via ORAL
  Filled 2016-09-22 (×3): qty 2

## 2016-09-22 NOTE — Progress Notes (Signed)
Patient's 02 saturations on 1L were 97%. Patient taken off oxygen at this time. RT will continue to monitor.

## 2016-09-22 NOTE — Progress Notes (Signed)
PROGRESS NOTE    SHAKURA COWING  IEP:[id number] DOB: 1946-01-28 DOA: 09/20/2016 PCP: [name], MD     Brief Narrative:  70 y/o woman admitted from home on 12/27 with SOB, found to have a mild COPD exacerbation and profound hyponatremia   Assessment & Plan:   Principal Problem:   Hyponatremia Active Problems:   GERD (gastroesophageal reflux disease)   Tobacco dependence   Chronic obstructive pulmonary disease with acute exacerbation (HCC)   Hypomagnesemia   Malnutrition of moderate degree   Hyponatremia -Falls and weakness at home likely related to severe hyponatremia. -Continues to improve with IVF, but still low at 125. -Will DC HCTZ indefinitely.  Hypokalemia -K 2.7 today, Mag ok at 2.2. -Will replete orally, recheck levels in am  COPD with acute exacerbation -Mild, no wheezing on exam, hence not on steroids currently. -Continue nebs and abx.  GERD -Continue PPI  Tobacco Abuse -Smoking cessation counseling provided   DVT prophylaxis: lovenox Code Status: DNR Family Communication: patient only Disposition Plan: home when ready, Consult PT  Consultants:   None  Procedures:   None  Antimicrobials:  Anti-infectives    Start     Dose/Rate Route Frequency Ordered Stop   09/23/16 0400  levofloxacin (LEVAQUIN) IVPB 500 mg     500 mg 100 mL/hr over 60 Minutes Intravenous Every 48 hours 09/21/16 0854     09/21/16 0345  levofloxacin (LEVAQUIN) IVPB 500 mg     500 mg 100 mL/hr over 60 Minutes Intravenous  Once 09/21/16 0338 09/21/16 0448       Subjective: Feels improved, still weak  Objective: Vitals:   09/21/16 2100 09/22/16 0524 09/22/16 0840 09/22/16 0849  BP: 136/66 136/68    Pulse: 80 83    Resp: 18 18    Temp: 98.3 F (36.8 C) 98.2 F (36.8 C)    TempSrc: Oral Oral    SpO2: 98% 95% 97% 98%  Weight:      Height:        Intake/Output Summary (Last 24 hours) at 09/22/16 1404 Last data filed at 09/22/16 0900  Gross per 24 hour    Intake              730 ml  Output                0 ml  Net              730 ml   Filed Weights   09/20/16 1917 09/21/16 0340  Weight: 47.6 kg (105 lb) 45.6 kg (100 lb 8.5 oz)    Examination:  General exam: Alert, awake, oriented x 3 Respiratory system: Clear to auscultation. Respiratory effort normal. Cardiovascular system:RRR. No murmurs, rubs, gallops. Gastrointestinal system: Abdomen is nondistended, soft and nontender. No organomegaly or masses felt. Normal bowel sounds heard. Central nervous system: Alert and oriented. No focal neurological deficits. Extremities: No C/C/E, +pedal pulses Skin: No rashes, lesions or ulcers Psychiatry: Judgement and insight appear normal. Mood & affect appropriate.     Data Reviewed: I have personally reviewed following labs and imaging studies  CBC:  Recent Labs Lab 09/20/16 2350 09/22/16 0439  WBC 12.7* 9.3  NEUTROABS 9.8* 6.6  HGB 13.1 11.6*  HCT 35.3* 32.0*  MCV 84.4 87.4  PLT 323 660   Basic Metabolic Panel:  Recent Labs Lab 09/20/16 2350 09/21/16 1110 09/22/16 0439  NA 113* 114* 125*  K 2.9* 3.7 2.7*  CL 76* 80* 94*  CO2 26 25 26  GLUCOSE 126* 102* 90  BUN '11 10 6  '$ CREATININE 0.56 0.65 0.58  CALCIUM 10.0 9.2 8.5*  MG 1.6*  --  2.2  PHOS 2.9  --  1.9*   GFR: Estimated Creatinine Clearance: 45.8 mL/min (by C-G formula based on SCr of 0.58 mg/dL). Liver Function Tests:  Recent Labs Lab 09/20/16 2350  AST 36  ALT 22  ALKPHOS 42  BILITOT 1.2  PROT 7.0  ALBUMIN 4.3   No results for input(s): LIPASE, AMYLASE in the last 168 hours. No results for input(s): AMMONIA in the last 168 hours. Coagulation Profile: No results for input(s): INR, PROTIME in the last 168 hours. Cardiac Enzymes: No results for input(s): CKTOTAL, CKMB, CKMBINDEX, TROPONINI in the last 168 hours. BNP (last 3 results) No results for input(s): PROBNP in the last 8760 hours. HbA1C: No results for input(s): HGBA1C in the last 72  hours. CBG: No results for input(s): GLUCAP in the last 168 hours. Lipid Profile: No results for input(s): CHOL, HDL, LDLCALC, TRIG, CHOLHDL, LDLDIRECT in the last 72 hours. Thyroid Function Tests: No results for input(s): TSH, T4TOTAL, FREET4, T3FREE, THYROIDAB in the last 72 hours. Anemia Panel: No results for input(s): VITAMINB12, FOLATE, FERRITIN, TIBC, IRON, RETICCTPCT in the last 72 hours. Urine analysis:    Component Value Date/Time   COLORURINE YELLOW 09/21/2016 0246   APPEARANCEUR CLEAR 09/21/2016 0246   LABSPEC 1.013 09/21/2016 0246   PHURINE 6.0 09/21/2016 0246   GLUCOSEU NEGATIVE 09/21/2016 0246   HGBUR MODERATE (A) 09/21/2016 0246   BILIRUBINUR NEGATIVE 09/21/2016 0246   KETONESUR 5 (A) 09/21/2016 0246   PROTEINUR 100 (A) 09/21/2016 0246   UROBILINOGEN 0.2 04/04/2015 1918   NITRITE NEGATIVE 09/21/2016 0246   LEUKOCYTESUR NEGATIVE 09/21/2016 0246   Sepsis Labs: '@LABRCNTIP'$ (procalcitonin:4,lacticidven:4)  )No results found for this or any previous visit (from the past 240 hour(s)).       Radiology Studies: Dg Chest 2 View  Result Date: 09/20/2016 CLINICAL DATA:  Status post fall, with concern for chest injury. Initial encounter. EXAM: CHEST  2 VIEW COMPARISON:  Chest radiograph performed 04/03/2015, and CT of the chest performed 07/11/2016 FINDINGS: The lungs are well-aerated and clear. There is no evidence of focal opacification, pleural effusion or pneumothorax. Bilateral nipple shadows are noted. Peribronchial thickening is noted. The heart is normal in size; the mediastinal contour is within normal limits. No acute osseous abnormalities are seen. IMPRESSION: Peribronchial thickening noted. Lungs otherwise grossly clear. No displaced rib fractures identified. Electronically Signed   By: Garald Balding M.D.   On: 09/20/2016 20:12   Ct Head Wo Contrast  Result Date: 09/21/2016 CLINICAL DATA:  Multiple falls.  Impact to the posterior head. EXAM: CT HEAD WITHOUT  CONTRAST TECHNIQUE: Contiguous axial images were obtained from the base of the skull through the vertex without intravenous contrast. COMPARISON:  None. FINDINGS: Brain: No mass lesion, intraparenchymal hemorrhage or extra-axial collection. No evidence of acute cortical infarct. Brain parenchyma and CSF-containing spaces are normal for age. Vascular: Atherosclerotic calcification of the internal carotid arteries at the skullbase. Skull: Normal visualized skull base, calvarium and extracranial soft tissues. Sinuses/Orbits: No sinus fluid levels or advanced mucosal thickening. No mastoid effusion. Normal orbits. IMPRESSION: Normal CT of the brain for age. Electronically Signed   By: Ulyses Jarred M.D.   On: 09/21/2016 00:36   Dg Hip Unilat W Or Wo Pelvis 2-3 Views Right  Result Date: 09/20/2016 CLINICAL DATA:  Fall with right hip pain.  Initial encounter. EXAM: DG HIP (WITH  OR WITHOUT PELVIS) 2-3V RIGHT COMPARISON:  None. FINDINGS: There is no evidence of hip fracture or dislocation. No evidence of pelvic ring fracture or diastasis. Osteopenia and atherosclerosis. IMPRESSION: No acute finding. Electronically Signed   By: Monte Fantasia M.D.   On: 09/20/2016 20:08        Scheduled Meds: . albuterol  2.5 mg Nebulization TID  . atorvastatin  20 mg Oral Daily  . enoxaparin (LOVENOX) injection  40 mg Subcutaneous Q24H  . famotidine  20 mg Oral BID  . feeding supplement (ENSURE ENLIVE)  237 mL Oral BID BM  . [START ON 09/23/2016] levofloxacin (LEVAQUIN) IV  500 mg Intravenous Q48H  . mirabegron ER  25 mg Oral Daily  . nicotine  14 mg Transdermal Daily  . potassium chloride  40 mEq Oral BID  . tiotropium  18 mcg Inhalation Daily   Continuous Infusions: . 0.9 % NaCl with KCl 40 mEq / L 75 mL/hr (09/22/16 0645)     LOS: 1 day    Time spent: 25 minutes. Greater than 50% of this time was spent in direct contact with the patient coordinating care.     Lelon Frohlich, MD [facility] Pager [phone]  If 7PM-7AM, please contact night-coverage [email or web address] Password [email or web address] 09/22/2016, 2:04 PM

## 2016-09-22 NOTE — Progress Notes (Signed)
CRITICAL VALUE ALERT  Critical value received: Potassium 2.7  Date of notification:  09/22/2016  Time of notification:  0625  Critical value read back: Yes  Nurse who received alert:  Marinell Blight  MD notified (1st page):  Dr. Olevia Bowens  Time of first page:  0626  MD notified (2nd page):  Time of second page:  Responding MD:  Dr. Olevia Bowens  Time MD responded:  [phone]

## 2016-09-22 NOTE — Progress Notes (Signed)
Patient ID: Lindsey Combs, female   DOB: [birth year], 70 y.o.   MRN: [id number]   The nursing staff reported that the patient's potassium level is 2.7 mmol/L. Potassium supplementation was given IV and orally. She is already on cardiac monitoring. A phosphorus level was added to today's labs.  Tennis Must, MD

## 2016-09-23 LAB — BASIC METABOLIC PANEL
BUN: 10 mg/dL (ref 6–20)
CHLORIDE: 104 mmol/L (ref 101–111)
CO2: 26 mmol/L (ref 22–32)
Calcium: 8.7 mg/dL — ABNORMAL LOW (ref 8.9–10.3)
Creatinine, Ser: 0.69 mg/dL (ref 0.44–1.00)
GFR calc non Af Amer: >60 mL/min (ref >60)
GLUCOSE: 93 mg/dL (ref 65–99)
POTASSIUM: 4.7 mmol/L (ref 3.5–5.1)
Sodium: 132 mmol/L — ABNORMAL LOW (ref 135–145)

## 2016-09-23 LAB — CBC WITH DIFFERENTIAL/PLATELET
BASOS ABS: 0.1 10*3/uL (ref 0.0–0.1)
BASOS PCT: 1 %
Eosinophils Absolute: 0.2 10*3/uL (ref 0.0–0.7)
Eosinophils Relative: 1 %
HEMATOCRIT: 33.9 % — AB (ref 36.0–46.0)
HEMOGLOBIN: 11.8 g/dL — AB (ref 12.0–15.0)
Lymphocytes Relative: 27 %
Lymphs Abs: 3.2 10*3/uL (ref 0.7–4.0)
MCH: 31.7 pg (ref 26.0–34.0)
MCHC: 34.8 g/dL (ref 30.0–36.0)
MCV: 91.1 fL (ref 78.0–100.0)
MONOS PCT: 10 %
Monocytes Absolute: 1.1 10*3/uL — ABNORMAL HIGH (ref 0.1–1.0)
NEUTROS ABS: 7.4 10*3/uL (ref 1.7–7.7)
NEUTROS PCT: 61 %
Platelets: 318 10*3/uL (ref 150–400)
RBC: 3.72 MIL/uL — ABNORMAL LOW (ref 3.87–5.11)
RDW: 15 % (ref 11.5–15.5)
WBC: 11.9 10*3/uL — ABNORMAL HIGH (ref 4.0–10.5)

## 2016-09-23 MED ORDER — HYDRALAZINE HCL 20 MG/ML IJ SOLN
5.0000 mg | INTRAMUSCULAR | Status: DC | PRN
  Administered 2016-09-23: 5 mg via INTRAVENOUS
  Filled 2016-09-23: qty 1

## 2016-09-23 MED ORDER — HYDRALAZINE HCL 20 MG/ML IJ SOLN: 5.0000 mg | INTRAMUSCULAR | Status: DC | PRN

## 2016-09-23 MED ORDER — LEVOFLOXACIN 500 MG PO TABS: 500.0000 mg | ORAL_TABLET | Freq: Every day | ORAL | Status: DC

## 2016-09-23 NOTE — Progress Notes (Signed)
Pharmacy Antibiotic Note  Lindsey Combs is a 70 y.o. female admitted on 09/20/2016 with COPD exacerbation.  Pharmacy has been consulted for Levaquin dosing.  Plan: Levaquin '500mg'$  po daily  F/U cultures and monitor clinical progress Duration of therapy per MD  Height: '5\' 3"'$  (160 cm) Weight: 100 lb 8.5 oz (45.6 kg) IBW/kg (Calculated) : 52.4  Temp (24hrs), Avg:98.3 F (36.8 C), Min:97.4 F (36.3 C), Max:98.9 F (37.2 C)   Recent Labs Lab 09/20/16 2350 09/21/16 1110 09/22/16 0439 09/23/16 0436  WBC 12.7*  --  9.3 11.9*  CREATININE 0.56 0.65 0.58 0.69    Estimated Creatinine Clearance: 45.8 mL/min (by C-G formula based on SCr of 0.69 mg/dL).    Allergies  Allergen Reactions  . Chantix [Varenicline] Other (See Comments)    Mental status changes  . Codeine Itching   Antimicrobials this admission: levaquin 12/27 >>   Microbiology results: No cultures  Thank you for allowing pharmacy to be a part of this patient's care.  Hart Robinsons, PharmD Clinical Pharmacist Pager:  [phone] 09/23/2016

## 2016-09-23 NOTE — Evaluation (Signed)
Physical Therapy Evaluation Patient Details Name: Lindsey Combs MRN: [id number] DOB: 1946/08/24 Today's Date: 09/23/2016   History of Present Illness   Lindsey Combs is a 70 y.o. female with medical history significant of anxiety, COPD, arthritis, diverticulosis, GERD, history of UTI, hypercholesterolemia, hypertension, hyponatremia, peripheral neuropathy, tobacco abuse who came to the emergency department after she called EMS to her home secondary to a fall. Patient was also found to be short of breath and with expiratory wheezing and was given a nebulizer treatment on route to the hospital. She complains that she has been having whitish and yellowish thick sputum for several days along with fever, night sweats and chills  Clinical Impression  Pt normally does not use an assistive device.  She lives alone.   Pt was able to ambulate for 80 feet with mod Independence with a rolling walker.  When this was taken away some gait instability was noted.  Pt states that she has a rolling walker.  The therapist recommended that the patient uses the walker for a short duration until she is stronger.     Follow Up Recommendations Home health PT    Equipment Recommendations    none   Recommendations for Other Services   none    Precautions / Restrictions Precautions Precautions: None Restrictions Weight Bearing Restrictions: No      Mobility  Bed Mobility Overal bed mobility: Modified Independent                Transfers Overall transfer level: Modified independent                  Ambulation/Gait Ambulation/Gait assistance: Modified independent (Device/Increase time) Ambulation Distance (Feet): 80 Feet Assistive device: Rolling walker (2 wheeled) Gait Pattern/deviations: WFL(Within Functional Limits)        Stairs            Wheelchair Mobility    Modified Rankin (Stroke Patients Only)       Balance                                             Pertinent Vitals/Pain Pain Assessment: No/denies pain    Home Living Family/patient expects to be discharged to:: Private residence Living Arrangements: Alone Available Help at Discharge: Family Type of Home: House Home Access: Stairs to enter Entrance Stairs-Rails: Right Entrance Stairs-Number of Steps: 3 Home Layout: One level Home Equipment: Environmental consultant - 2 wheels      Prior Function Level of Independence: Independent               Hand Dominance        Extremity/Trunk Assessment        Lower Extremity Assessment Lower Extremity Assessment: Overall WFL for tasks assessed       Communication   Communication: No difficulties  Cognition Arousal/Alertness: Awake/alert Behavior During Therapy: WFL for tasks assessed/performed Overall Cognitive Status: Within Functional Limits for tasks assessed                      General Comments      Exercises     Assessment/Plan    PT Assessment All further PT needs can be met in the next venue of care  PT Problem List            PT Treatment Interventions      PT Goals (  Current goals can be found in the Care Plan section)  Acute Rehab PT Goals Patient Stated Goal: to go home PT Goal Formulation: With patient Potential to Achieve Goals: Good            nd of Session Equipment Utilized During Treatment: Gait belt Activity Tolerance: Patient tolerated treatment well Patient left: in chair;with call bell/phone within reach Nurse Communication: Mobility status    Functional Limitation: Mobility: Walking and moving around Mobility: Walking and Moving Around Current Status (R4483): At least 20 percent but less than 40 percent impaired, limited or restricted Mobility: Walking and Moving Around Goal Status [phone]): At least 20 percent but less than 40 percent impaired, limited or restricted Mobility: Walking and Moving Around Discharge Status [phone]): At least 20 percent but less than 40  percent impaired, limited or restricted    Time: 1145-1211 PT Time Calculation (min) (ACUTE ONLY): 26 min   Charges:         PT G Codes:   PT G-Codes **NOT FOR INPATIENT CLASS** Functional Limitation: Mobility: Walking and moving around Mobility: Walking and Moving Around Current Status (I2202): At least 20 percent but less than 40 percent impaired, limited or restricted Mobility: Walking and Moving Around Goal Status [phone]): At least 20 percent but less than 40 percent impaired, limited or restricted Mobility: Walking and Moving Around Discharge Status [phone]): At least 20 percent but less than 40 percent impaired, limited or restricted    Rayetta Humphrey, PT CLT [phone] 09/23/2016, 12:32 PM

## 2016-09-23 NOTE — Progress Notes (Signed)
Patient discharged home.  IV removed - WNL.  Reviewed Dc instructions and med changes, verbalizes understanding.  Follow up in place.  Assisted off unit via WC in NAD

## 2016-09-23 NOTE — Discharge Summary (Signed)
Physician Discharge Summary  Lindsey Combs GXQ:[id number] DOB: 1946-09-01 DOA: 09/20/2016  PCP: [name], MD  Admit date: 09/20/2016 Discharge date: 09/23/2016  Time spent: 45 minutes  Recommendations for Outpatient Follow-up:  -Will be discharged home today. -Advised to discontinue use of HCTZ and to follow up with PCP in 2 weeks.   Discharge Diagnoses:  Principal Problem:   Hyponatremia Active Problems:   GERD (gastroesophageal reflux disease)   Tobacco dependence   Chronic obstructive pulmonary disease with acute exacerbation (HCC)   Hypomagnesemia   Malnutrition of moderate degree   Discharge Condition: Stable and improved  Filed Weights   09/20/16 1917 09/21/16 0340  Weight: 47.6 kg (105 lb) 45.6 kg (100 lb 8.5 oz)    History of present illness:  As per Dr. Olevia Bowens on 12/27: Lindsey Combs is a 70 y.o. female with medical history significant of anxiety, COPD, arthritis, diverticulosis, GERD, history of UTI, hypercholesterolemia, hypertension, hyponatremia, peripheral neuropathy, tobacco abuse who came to the emergency department after she called EMS to her home secondary to a fall. Patient was also found to be short of breath and with expiratory wheezing and was given a nebulizer treatment on route to the hospital. She complains that she has been having whitish and yellowish thick sputum for several days along with fever, night sweats and chills. She denies chest pain, palpitations, dizziness, pitting edema lower extremities, abdominal pain, nausea, emesis, diarrhea, melena or hematochezia. She denies GU symptoms.  ED Course: The patient received albuterol and ipratropium neb treatments. Her workup shows WBC of 12.7, hemoglobin level of 13.1 g/dL, platelets of 323 Sodium was 113, potassium 2.9, chloride 76 and bicarbonate was 26 mmol/L. Her BUN was 11, creatinine 0.56 and glucose 126 mg/dL.  Hospital Course:   Hyponatremia -Falls and weakness at home likely  related to severe hyponatremia. -Resolved with IVF and discontinuation of HCTZ.. -Na is 132 on DC up from 119. -Will DC HCTZ indefinitely.  Hypokalemia -Replaced, Mag ok at 2.2.  COPD with acute exacerbation -Mild, no wheezing on exam, hence not on steroids currently. -Continue nebs and abx.  GERD -Continue PPI  Tobacco Abuse -Smoking cessation counseling provided  Procedures:  None   Consultations:  None  Discharge Instructions  Discharge Instructions    Diet - low sodium heart healthy    Complete by:  As directed    Increase activity slowly    Complete by:  As directed      Allergies as of 09/23/2016      Reactions   Chantix [varenicline] Other (See Comments)   Mental status changes   Codeine Itching      Medication List    STOP taking these medications   hydrochlorothiazide 25 MG tablet Commonly known as:  HYDRODIURIL     TAKE these medications   acetaminophen 500 MG tablet Commonly known as:  TYLENOL Take 500 mg by mouth every 6 (six) hours as needed for mild pain.   albuterol 108 (90 Base) MCG/ACT inhaler Commonly known as:  PROAIR HFA Inhale 2 puffs into the lungs every 4 (four) hours as needed for wheezing or shortness of breath.   atorvastatin 20 MG tablet Commonly known as:  LIPITOR Take 20 mg by mouth daily.   LORazepam 1 MG tablet Commonly known as:  ATIVAN Take 0.5 mg by mouth 3 (three) times daily as needed for anxiety.   ranitidine 150 MG tablet Commonly known as:  ZANTAC Take 150 mg by mouth at bedtime.  SYMBICORT 160-4.5 MCG/ACT inhaler Generic drug:  budesonide-formoterol Inhale 2 puffs into the lungs 2 (two) times daily.   tiotropium 18 MCG inhalation capsule Commonly known as:  SPIRIVA Place 18 mcg into inhaler and inhale daily as needed. Shortness of breath      Allergies  Allergen Reactions  . Chantix [Varenicline] Other (See Comments)    Mental status changes  . Codeine Itching   Follow-up Information     [name], MD. Schedule an appointment as soon as possible for a visit on 09/27/2016.   Specialty:  Internal Medicine Why:  3 pm Contact information: [address] [place] [address] [phone]            The results of significant diagnostics from this hospitalization (including imaging, microbiology, ancillary and laboratory) are listed below for reference.    Significant Diagnostic Studies: Dg Chest 2 View  Result Date: 09/20/2016 CLINICAL DATA:  Status post fall, with concern for chest injury. Initial encounter. EXAM: CHEST  2 VIEW COMPARISON:  Chest radiograph performed 04/03/2015, and CT of the chest performed 07/11/2016 FINDINGS: The lungs are well-aerated and clear. There is no evidence of focal opacification, pleural effusion or pneumothorax. Bilateral nipple shadows are noted. Peribronchial thickening is noted. The heart is normal in size; the mediastinal contour is within normal limits. No acute osseous abnormalities are seen. IMPRESSION: Peribronchial thickening noted. Lungs otherwise grossly clear. No displaced rib fractures identified. Electronically Signed   By: Garald Balding M.D.   On: 09/20/2016 20:12   Ct Head Wo Contrast  Result Date: 09/21/2016 CLINICAL DATA:  Multiple falls.  Impact to the posterior head. EXAM: CT HEAD WITHOUT CONTRAST TECHNIQUE: Contiguous axial images were obtained from the base of the skull through the vertex without intravenous contrast. COMPARISON:  None. FINDINGS: Brain: No mass lesion, intraparenchymal hemorrhage or extra-axial collection. No evidence of acute cortical infarct. Brain parenchyma and CSF-containing spaces are normal for age. Vascular: Atherosclerotic calcification of the internal carotid arteries at the skullbase. Skull: Normal visualized skull base, calvarium and extracranial soft tissues. Sinuses/Orbits: No sinus fluid levels or advanced mucosal thickening. No mastoid effusion. Normal orbits. IMPRESSION: Normal CT of the brain  for age. Electronically Signed   By: Ulyses Jarred M.D.   On: 09/21/2016 00:36   Dg Hip Unilat W Or Wo Pelvis 2-3 Views Right  Result Date: 09/20/2016 CLINICAL DATA:  Fall with right hip pain.  Initial encounter. EXAM: DG HIP (WITH OR WITHOUT PELVIS) 2-3V RIGHT COMPARISON:  None. FINDINGS: There is no evidence of hip fracture or dislocation. No evidence of pelvic ring fracture or diastasis. Osteopenia and atherosclerosis. IMPRESSION: No acute finding. Electronically Signed   By: Monte Fantasia M.D.   On: 09/20/2016 20:08    Microbiology: No results found for this or any previous visit (from the past 240 hour(s)).   Labs: Basic Metabolic Panel:  Recent Labs Lab 09/20/16 2350 09/21/16 1110 09/22/16 0439 09/23/16 0436  NA 113* 114* 125* 132*  K 2.9* 3.7 2.7* 4.7  CL 76* 80* 94* 104  CO2 '26 25 26 26  '$ GLUCOSE 126* 102* 90 93  BUN '11 10 6 10  '$ CREATININE 0.56 0.65 0.58 0.69  CALCIUM 10.0 9.2 8.5* 8.7*  MG 1.6*  --  2.2  --   PHOS 2.9  --  1.9*  --    Liver Function Tests:  Recent Labs Lab 09/20/16 2350  AST 36  ALT 22  ALKPHOS 42  BILITOT 1.2  PROT 7.0  ALBUMIN  4.3   No results for input(s): LIPASE, AMYLASE in the last 168 hours. No results for input(s): AMMONIA in the last 168 hours. CBC:  Recent Labs Lab 09/20/16 2350 09/22/16 0439 09/23/16 0436  WBC 12.7* 9.3 11.9*  NEUTROABS 9.8* 6.6 7.4  HGB 13.1 11.6* 11.8*  HCT 35.3* 32.0* 33.9*  MCV 84.4 87.4 91.1  PLT 323 314 318   Cardiac Enzymes: No results for input(s): CKTOTAL, CKMB, CKMBINDEX, TROPONINI in the last 168 hours. BNP: BNP (last 3 results) No results for input(s): BNP in the last 8760 hours.  ProBNP (last 3 results) No results for input(s): PROBNP in the last 8760 hours.  CBG: No results for input(s): GLUCAP in the last 168 hours.     SignedLelon Frohlich  [facility] Pager: [phone] 09/23/2016, 3:41 PM

## 2016-09-23 NOTE — Care Management Important Message (Signed)
Important Message  Patient Details  Name: Lindsey Combs MRN: [id number] Date of Birth: 12/24/1945   Medicare Important Message Given:  Yes    Ramondo Dietze, Chauncey Reading, RN 09/23/2016, 11:27 AM

## 2016-09-29 ENCOUNTER — Encounter (HOSPITAL_COMMUNITY): Payer: Self-pay | Admitting: Hematology & Oncology

## 2016-09-29 ENCOUNTER — Encounter (HOSPITAL_COMMUNITY): Payer: Medicare Other | Attending: Hematology & Oncology | Admitting: Hematology & Oncology

## 2016-09-29 VITALS — BP 163/64 | HR 91 | Temp 97.9°F | Resp 16 | Wt 98.6 lb

## 2016-09-29 DIAGNOSIS — Z720 Tobacco use: Secondary | ICD-10-CM | POA: Diagnosis not present

## 2016-09-29 DIAGNOSIS — R634 Abnormal weight loss: Secondary | ICD-10-CM

## 2016-09-29 DIAGNOSIS — C052 Malignant neoplasm of uvula: Secondary | ICD-10-CM | POA: Diagnosis not present

## 2016-09-29 DIAGNOSIS — C109 Malignant neoplasm of oropharynx, unspecified: Secondary | ICD-10-CM

## 2016-09-29 MED ORDER — LORAZEPAM 1 MG PO TABS: 0.5000 mg | ORAL_TABLET | Freq: Three times a day (TID) | ORAL | 1 refills | Status: DC | PRN

## 2016-09-29 MED ORDER — AMLODIPINE BESYLATE 5 MG PO TABS: 5.0000 mg | ORAL_TABLET | Freq: Every day | ORAL | 3 refills | Status: DC

## 2016-09-29 NOTE — Patient Instructions (Addendum)
[place] at [facility] Discharge Instructions  RECOMMENDATIONS MADE BY THE CONSULTANT AND ANY TEST RESULTS WILL BE SENT TO YOUR REFERRING PHYSICIAN.  You were seen today by Dr. Whitney Muse We refilled your ativan and blood pressure medication today You will return in 1 week for blood pressure check and then return in 6 weeks to see Dr. Whitney Muse I will email Loren Racer, SW to see if you qualify for help at home.  We will be in contact with you.    Thank you for choosing [place] at [facility] to provide your oncology and hematology care.  To afford each patient quality time with our provider, please arrive at least 15 minutes before your scheduled appointment time.    If you have a lab appointment with the [place] please come in thru the  Main Entrance and check in at the main information desk  You need to re-schedule your appointment should you arrive 10 or more minutes late.  We strive to give you quality time with our providers, and arriving late affects you and other patients whose appointments are after yours.  Also, if you no show three or more times for appointments you may be dismissed from the clinic at the providers discretion.     Again, thank you for choosing [facility].  Our hope is that these requests will decrease the amount of time that you wait before being seen by our physicians.       _____________________________________________________________  Should you have questions after your visit to [facility], please contact our office at [phone] between the hours of 8:30 a.m. and 4:30 p.m.  Voicemails left after 4:30 p.m. will not be returned until the following business day.  For prescription refill requests, have your pharmacy contact our office.       Resources For Cancer Patients and their Caregivers ? American Cancer Society: Can assist with transportation, wigs, general needs, runs  Look Good Feel Better.        951-577-3600 ? Cancer Care: Provides financial assistance, online support groups, medication/co-pay assistance.  1-800-813-HOPE 320 330 6222) ? [place] Assists [place] cancer patients and their families through emotional , educational and financial support.  769-317-9787 ? [place] DSS Where to apply for food stamps, Medicaid and utility assistance. 580-720-8745 ? RCATS: Transportation to medical appointments. 267-669-9066 ? Social Security Administration: May apply for disability if have a Stage IV cancer. (217)872-2077 (724)665-5858 ? [employer], Disability and Transit Services: Assists with nutrition, care and transit needs. [place] Support Programs: '@10RELATIVEDAYS'$ @ > Cancer Support Group  2nd Tuesday of the month 1pm-2pm, Journey Room  > Creative Journey  3rd Tuesday of the month 1130am-1pm, Journey Room  > Look Good Feel Better  1st Wednesday of the month 10am-12 noon, Journey Room (Call [place] to register [phone])
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
## 2016-09-30 ENCOUNTER — Telehealth (HOSPITAL_COMMUNITY): Payer: Self-pay | Admitting: *Deleted

## 2016-09-30 ENCOUNTER — Encounter (HOSPITAL_COMMUNITY): Payer: Self-pay | Admitting: *Deleted

## 2016-09-30 NOTE — Telephone Encounter (Signed)
Spoke with patient via telephone and advised her to follow up at [facility].  Patient states that she has already been there for help and will think about going back but probably will not do so.

## 2016-09-30 NOTE — Telephone Encounter (Signed)
-----   Message from Meta Hatchet, Roy sent at 09/30/2016 10:56 AM EST ----- What are her exact needs? There are no notes in so I really don't know what to tell you to do. There is no way to put in an order for health dept referral. Can you review the case with Hildred Alamin? Pt/family most likely needs to follow up with HD directly. I just can't see any notes, so don't know how to help you.  ----- Message ----- From: Donetta Potts, RN Sent: 09/30/2016   9:29 AM To: Meta Hatchet, LCSW  Okay, so what should I do now for the patient. Does she qualify for Medicare CAP program or do I need to refer her to the health department. If that is the case, what order do I put in for that consult?   ----- Message ----- From: Meta Hatchet, LCSW Sent: 09/29/2016   3:14 PM To: Donetta Potts, RN, Patrici Ranks, MD   Hi,   [facility] for skilled need is covered through medicare if pt is homebound. Such as RN, PT/OT. Looks like she was seen by PT inpt on 12/29 and they recommended [facility]. Not sure if it was set up as I don't see an RN case manager note. If pt is just wanting CNA help, there is not FREE cna help. Usually, pts have to pay out of pocket. There are limited options through office of Aging and Disability through the Health dept. There is a binder I put together up at desk that has some different resources in it. I think it is next to the printer. We might need that more in the RN station. Feel free to go get it and put where you can access best. Look under Adult Care Needs tab as this should address your question.  Thanks,  Abby Potash ----- Message ----- From: Donetta Potts, RN Sent: 09/29/2016   2:54 PM To: Meta Hatchet, LCSW  Dr. Whitney Muse wanted to refer this patient to you to see if she qualifies for any programs through medicare for home health / help at home.

## 2016-10-04 ENCOUNTER — Encounter: Payer: Self-pay | Admitting: *Deleted

## 2016-10-04 NOTE — Progress Notes (Signed)
[facility] Clinical Social Work  Clinical Social Work was referred by nurse  for assessment of psychosocial needs due to possible "need for help at home". Clinical Social Worker contacted patient at home to offer support and assess for needs.  Pt reports things are going well at home and she has been able to get herself to appointments, do her own cooking and cleaning. She denies needing PT at home or in the community. She does not appear to be homebound. CSW educated pt about PCS through medicaid if she declines. Pt aware to reach out to her PCP or [place] if things get worse for ADLs. She refused PT through [facility] at time of discharge from the hospital. She denied other needs currently and agrees to reach out if needs change.   Clinical Social Work interventions: Resource education  Loren Racer, [place] Tuesdays   [phone]

## 2016-10-06 ENCOUNTER — Encounter (HOSPITAL_COMMUNITY): Payer: Medicare Other | Admitting: *Deleted

## 2016-10-06 ENCOUNTER — Encounter (HOSPITAL_COMMUNITY): Payer: Self-pay | Admitting: *Deleted

## 2016-10-06 VITALS — BP 131/54 | HR 89 | Temp 97.8°F | Resp 18

## 2016-10-06 DIAGNOSIS — Z0130 Encounter for examination of blood pressure without abnormal findings: Secondary | ICD-10-CM

## 2016-10-06 NOTE — Progress Notes (Signed)
Patient presents ambulatory in office for blood pressure check. She states that she has been feeling a little better.  She does verbalizes concerns about pain in her legs from thighs down into feet.  Kirby Crigler, PA-C advised for patient to take Aleve 1 pill twice daily and to follow up with per PCP.  Patient agrees and verbalizes understanding.  Patient discharged ambulatory from clinic.

## 2016-10-06 NOTE — Patient Instructions (Signed)
[place] at [facility] Discharge Instructions  RECOMMENDATIONS MADE BY THE CONSULTANT AND ANY TEST RESULTS WILL BE SENT TO YOUR REFERRING PHYSICIAN.   you were seen today by Jene Every, RN for blood pressure check . Your blood pressure was 131/54.  Improved from last visit.  Follow up as scheduled with Dr. Whitney Muse  Thank you for choosing [place] at [facility] to provide your oncology and hematology care.  To afford each patient quality time with our provider, please arrive at least 15 minutes before your scheduled appointment time.    If you have a lab appointment with the [place] please come in thru the  Main Entrance and check in at the main information desk  You need to re-schedule your appointment should you arrive 10 or more minutes late.  We strive to give you quality time with our providers, and arriving late affects you and other patients whose appointments are after yours.  Also, if you no show three or more times for appointments you may be dismissed from the clinic at the providers discretion.     Again, thank you for choosing [facility].  Our hope is that these requests will decrease the amount of time that you wait before being seen by our physicians.       _____________________________________________________________  Should you have questions after your visit to [facility], please contact our office at [phone] between the hours of 8:30 a.m. and 4:30 p.m.  Voicemails left after 4:30 p.m. will not be returned until the following business day.  For prescription refill requests, have your pharmacy contact our office.       Resources For Cancer Patients and their Caregivers ? American Cancer Society: Can assist with transportation, wigs, general needs, runs Look Good Feel Better.        306-100-0458 ? Cancer Care: Provides financial assistance, online support groups,  medication/co-pay assistance.  1-800-813-HOPE 365-777-0417) ? [place] Assists [place] cancer patients and their families through emotional , educational and financial support.  (628)332-5952 ? [place] DSS Where to apply for food stamps, Medicaid and utility assistance. (520)320-2757 ? RCATS: Transportation to medical appointments. 6573351444 ? Social Security Administration: May apply for disability if have a Stage IV cancer. 301-818-5703 850-458-0406 ? [employer], Disability and Transit Services: Assists with nutrition, care and transit needs. [place] Support Programs: '@10RELATIVEDAYS'$ @ > Cancer Support Group  2nd Tuesday of the month 1pm-2pm, Journey Room  > Creative Journey  3rd Tuesday of the month 1130am-1pm, Journey Room  > Look Good Feel Better  1st Wednesday of the month 10am-12 noon, Journey Room (Call [place] to register [phone])

## 2016-10-25 ENCOUNTER — Encounter (HOSPITAL_COMMUNITY): Payer: Self-pay | Admitting: Hematology & Oncology

## 2016-11-10 ENCOUNTER — Encounter (HOSPITAL_COMMUNITY): Payer: Self-pay

## 2016-11-10 ENCOUNTER — Encounter (HOSPITAL_COMMUNITY): Payer: Medicare Other | Attending: Oncology | Admitting: Oncology

## 2016-11-10 VITALS — BP 164/55 | HR 92 | Temp 98.2°F | Resp 18 | Wt 98.7 lb

## 2016-11-10 DIAGNOSIS — F419 Anxiety disorder, unspecified: Secondary | ICD-10-CM

## 2016-11-10 DIAGNOSIS — C052 Malignant neoplasm of uvula: Secondary | ICD-10-CM | POA: Diagnosis not present

## 2016-11-10 DIAGNOSIS — Z720 Tobacco use: Secondary | ICD-10-CM

## 2016-11-10 NOTE — Progress Notes (Addendum)
[place]  Progress Note  Patient Care Team: Lindsey Gravel, MD as PCP - General (Internal Medicine)  CHIEF COMPLAINTS/PURPOSE OF CONSULTATION:  Squamous cell carcinoma of the uvula, suspicious for invasion Tobacco Abuse  HISTORY OF PRESENTING ILLNESS:  Lindsey Combs 71 y.o. female is here because of squamous cell carcinoma of the uvula, suspicious for invasion.  Lindsey Combs is a pleasant 71 y.o. who presented with severe sore throat which persisted through antibiotics. She underwent a laryngoscopy on 06/08/2016 with Dr. Benjamine Mola showing severe posterior laryngeal edema. 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 She then underwent a direct laryngoscopy and biopsy of her uvula on 06/08/2016 with Dr. Benjamine Mola. Pathology of the uvula revealed squamous cell carcinoma suspicious for invasion.   Patient presents today for continued follow up. She saw Dr. Nicolette Bang at [facility] on Nov 08, 2016 and had no evidence of disease. She no longer needs to see him and will continue visiting [place] since patient has difficulty traveling to [facility].  Patient states she has been doing a lot better. Denies difficulty swallowing or eating. She is actively working on cutting down her smoking. She states she still smokes daily however she varies the number of cigarettes she smokes depending on her stress level. States that she is working with her PCP regarding management of her BP. Her anxiety is better and she is off of xanax.     MEDICAL HISTORY:  Past Medical History:  Diagnosis Date   Acute respiratory failure with hypoxia (HCC)    Anxiety    COPD (chronic obstructive pulmonary disease) (HCC)    DDD (degenerative disc disease), lumbar    Degenerative joint disease of spine    Diverticulosis    GERD (gastroesophageal reflux disease)    H. pylori infection    treated 02/2013.    History of UTI    Hypercholesterolemia    Hypertension    Hyponatremia    Peripheral neuropathy (HCC)    Shortness of breath    with exertion   Tobacco abuse     SURGICAL HISTORY: Past Surgical History:  Procedure Laterality Date   ABDOMINAL EXPLORATION SURGERY     age 27, large ovarian cyst   BIOPSY N/A 03/01/2013   HER:DEYC hiatal hernia; otherwise, normal examination/ Status post biopsies as described above. SB bx negative for Celiac. +h/pylori   CATARACT EXTRACTION W/ INTRAOCULAR LENS  IMPLANT, BILATERAL     COLONOSCOPY  10/16/09   Jenkins:3 polypsin the sigmoid colon/polyps in the descending colon and the rectum/scattered diverticulum. hyperplastic   COLONOSCOPY WITH ESOPHAGOGASTRODUODENOSCOPY (EGD) N/A 03/01/2013   XKG:YJEHUDJS colonic polyps - treated/removed as described above. Colonic diverticulosis. Hyperplastic. Next TCS 02/2018.   DIRECT LARYNGOSCOPY N/A 06/08/2016   Procedure: DIRECT LARYNGOSCOPY AND BIOPSY;  Surgeon: Leta Baptist, MD;  Location: [facility] OR;  Service: ENT;  Laterality: N/A;   PARTIAL HYSTERECTOMY     age 27    SOCIAL HISTORY: Social History   Social History   Marital status: Divorced    Spouse name: N/A   Number of children: 4   Years of education: N/A   Occupational History   Not on file.   Social History Main Topics   Smoking status: Current Every Day Smoker    Packs/day: 1.50    Years: 50.00    Types: Cigarettes   Smokeless tobacco: Never Used   Alcohol use No  Comment: glass of wine occasionally   Drug use: No   Sexual activity: Not on file   Other Topics Concern   Not on file   Social History Narrative   No narrative on file   Divorced. Lives alone in senior housing 4 children; 2 sons and 2 daughters 6 grandchildren 3 great grandchildren Smoker since 63 or 22 yo. 1 ppd ETOH, used to big time, she liked beer. She stopped in 2001 when she started going to church. 1 dog named Eduard Clos, part terrier part  [place] She enjoys gardening.  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 Then she took care of her mom and dad until they went in retirement homes in 2008  FAMILY HISTORY: Family History  Problem Relation Age of Onset   Leukemia Father    Thyroid disease Father    Stomach cancer Paternal Grandfather    Colon cancer Neg Hx    Mother deceased at 11 yo. She had Alzheimer's but she was healthy Father deceased at 22 yo of blood cancer. He had to have a transfusion every 2 months. Then his potassium was too high and they couldn't give him anymore. 2 brothers. Oldest brother had a heart attack earlier this year. Other brother has chronic stomach ulcers  ALLERGIES:  is allergic to chantix [varenicline] and codeine.  MEDICATIONS:  Current Outpatient Prescriptions  Medication Sig Dispense Refill   acetaminophen (TYLENOL) 500 MG tablet Take 500 mg by mouth every 6 (six) hours as needed for mild pain.     albuterol (PROAIR HFA) 108 (90 BASE) MCG/ACT inhaler Inhale 2 puffs into the lungs every 4 (four) hours as needed for wheezing or shortness of breath. 1 Inhaler 1   ALPRAZolam (XANAX) 0.25 MG tablet      amLODipine (NORVASC) 5 MG tablet Take 1 tablet (5 mg total) by mouth daily. 30 tablet 3   atorvastatin (LIPITOR) 20 MG tablet Take 20 mg by mouth daily.     ranitidine (ZANTAC) 150 MG tablet Take 150 mg by mouth at bedtime.     SYMBICORT 160-4.5 MCG/ACT inhaler Inhale 2 puffs into the lungs 2 (two) times daily.     tiotropium (SPIRIVA) 18 MCG inhalation capsule Place 18 mcg into inhaler and inhale daily as needed. Shortness of breath     No current facility-administered medications for this visit.     Review of Systems  Eyes: Negative.   Respiratory: Negative.        Denies difficulty swallowing or eating.   Cardiovascular: Negative.   Gastrointestinal: Negative.   Genitourinary: Negative.   Musculoskeletal: Negative.   Skin: Negative.   Neurological:  Negative.   Endo/Heme/Allergies: Negative.   Psychiatric/Behavioral: The patient is nervous/anxious (better than before, she is currently off xanax).   All other systems reviewed and are negative. 14 point ROS was done and is otherwise as detailed above or in HPI   PHYSICAL EXAMINATION: ECOG PERFORMANCE STATUS: 1 - Symptomatic but completely ambulatory  Vitals:   11/10/16 1144  BP: (!) 164/55  Pulse: 92  Resp: 18  Temp: 98.2 F (36.8 C)   Filed Weights   11/10/16 1144  Weight: 98 lb 11.2 oz (44.8 kg)      Physical Exam  Constitutional: She is oriented to person, place, and time and well-developed, well-nourished, and in no distress.  HENT:  Head: Normocephalic and atraumatic.  Mouth/Throat: Oropharynx is clear and moist.  Well healed from uvelectomy. No evidence of oral lesions. patient is edentulous.  Eyes: Conjunctivae and EOM are normal. Pupils are equal, round, and reactive to light. Right eye exhibits no discharge. Left eye exhibits no discharge. No scleral icterus.  Neck: Normal range of motion. Neck supple. No tracheal deviation present. No thyromegaly present.  Cardiovascular: Normal rate, regular rhythm and normal heart sounds.   No murmur heard. Pulmonary/Chest: Breath sounds normal. No respiratory distress. She has no wheezes. She has no rales. She exhibits no tenderness.  Abdominal: Soft. Bowel sounds are normal. She exhibits no distension and no mass. There is no rebound and no guarding.  Musculoskeletal: Normal range of motion. She exhibits no edema or tenderness.  Neurological: She is alert and oriented to person, place, and time. Gait normal.  Skin: Skin is warm and dry. No rash noted. No erythema. No pallor.  Psychiatric: Mood, memory, affect and judgment normal.  Nursing note and vitals reviewed.   LABORATORY DATA:  I have reviewed the data as listed Lab Results  Component Value Date   WBC 11.9 (H) 09/23/2016   HGB 11.8 (L) 09/23/2016   HCT 33.9 (L)  09/23/2016   MCV 91.1 09/23/2016   PLT 318 09/23/2016   CMP     Component Value Date/Time   NA 132 (L) 09/23/2016 0436   NA 143 01/14/2013   K 4.7 09/23/2016 0436   K 4.7 01/14/2013   CL 104 09/23/2016 0436   CO2 26 09/23/2016 0436   GLUCOSE 93 09/23/2016 0436   BUN 10 09/23/2016 0436   BUN 10 01/14/2013   CREATININE 0.69 09/23/2016 0436   CREATININE 0.74 01/14/2013   CALCIUM 8.7 (L) 09/23/2016 0436   CALCIUM 10.2 01/14/2013   PROT 7.0 09/20/2016 2350   ALBUMIN 4.3 09/20/2016 2350   ALBUMIN 4.5 01/14/2013   AST 36 09/20/2016 2350   AST 12 01/14/2013   ALT 22 09/20/2016 2350   ALKPHOS 42 09/20/2016 2350   ALKPHOS 87 01/14/2013   BILITOT 1.2 09/20/2016 2350   BILITOT 0.3 01/14/2013   GFRNONAA >60 09/23/2016 0436   GFRAA >60 09/23/2016 0436     RADIOGRAPHIC STUDIES: I have personally reviewed the radiological images as listed and agreed with the findings in the report. Study Result   CLINICAL DATA:  Multiple falls.  Impact to the posterior head.  EXAM: CT HEAD WITHOUT CONTRAST  TECHNIQUE: Contiguous axial images were obtained from the base of the skull through the vertex without intravenous contrast.  COMPARISON:  None.  FINDINGS: Brain: No mass lesion, intraparenchymal hemorrhage or extra-axial collection. No evidence of acute cortical infarct. Brain parenchyma and CSF-containing spaces are normal for age.  Vascular: Atherosclerotic calcification of the internal carotid arteries at the skullbase.  Skull: Normal visualized skull base, calvarium and extracranial soft tissues.  Sinuses/Orbits: No sinus fluid levels or advanced mucosal thickening. No mastoid effusion. Normal orbits.  IMPRESSION: Normal CT of the brain for age.   Electronically Signed   By: Ulyses Jarred M.D.   On: 09/21/2016 00:36      PATHOLOGY   ASSESSMENT & PLAN:  Squamous cell carcinoma of the uvula, suspicious for invasion T1 N0 M0 squamous cell carcinoma of the  uvula  Tobacco Abuse   Clinical no evidence of disease on exam today. Continue surveillance.  She will see  Dr. Benjamine Mola for continued follow up and exam.   Encouraged continued efforts for smoke cessation.   RTC in 6 months for follow up and exam. Patient knows to come see me sooner should she have any new symptoms arise in  the interim prior to her next visit.      All questions were answered. The patient knows to call the clinic with any problems, questions or concerns.  This document serves as a record of services personally performed by Twana First, MD. It was created on her behalf by Shirlean Mylar, a trained medical scribe. The creation of this record is based on the scribe's personal observations and the provider's statements to them. This document has been checked and approved by the attending provider.  I have reviewed the above documentation for accuracy and completeness and I agree with the above.  This note was electronically signed.  Twana First, MD 11/10/2016 12:07 PM

## 2016-11-10 NOTE — Patient Instructions (Signed)
[place] at [facility] Discharge Instructions  RECOMMENDATIONS MADE BY THE CONSULTANT AND ANY TEST RESULTS WILL BE SENT TO YOUR REFERRING PHYSICIAN.  You were seen today by Dr. Barron Schmid Follow up in 6 months See Amy up front for appointments   Thank you for choosing [place] at [facility] to provide your oncology and hematology care.  To afford each patient quality time with our provider, please arrive at least 15 minutes before your scheduled appointment time.    If you have a lab appointment with the [facility] please come in thru the  Main Entrance and check in at the main information desk  You need to re-schedule your appointment should you arrive 10 or more minutes late.  We strive to give you quality time with our providers, and arriving late affects you and other patients whose appointments are after yours.  Also, if you no show three or more times for appointments you may be dismissed from the clinic at the providers discretion.     Again, thank you for choosing [facility].  Our hope is that these requests will decrease the amount of time that you wait before being seen by our physicians.       _____________________________________________________________  Should you have questions after your visit to [facility], please contact our office at [phone] between the hours of 8:30 a.m. and 4:30 p.m.  Voicemails left after 4:30 p.m. will not be returned until the following business day.  For prescription refill requests, have your pharmacy contact our office.       Resources For Cancer Patients and their Caregivers ? American Cancer Society: Can assist with transportation, wigs, general needs, runs Look Good Feel Better.        215-647-4359 ? Cancer Care: Provides financial assistance, online support groups, medication/co-pay assistance.  1-800-813-HOPE (510)440-1757) ? [facility] Assists [place] cancer patients and their families through emotional , educational and financial support.  (330)608-9479 ? [place] DSS Where to apply for food stamps, Medicaid and utility assistance. 365-144-3061 ? [facility]: Transportation to medical appointments. 541-231-2948 ? Social Security Administration: May apply for disability if have a Stage IV cancer. 770-224-0652 7747881536 ? [employer], Disability and Transit Services: Assists with nutrition, care and transit needs. [place] Support Programs: '@10RELATIVEDAYS'$ @ > Cancer Support Group  2nd Tuesday of the month 1pm-2pm, Journey Room  > Creative Journey  3rd Tuesday of the month 1130am-1pm, Journey Room  > Look Good Feel Better  1st Wednesday of the month 10am-12 noon, Journey Room (Call [place] to register [phone])

## 2017-01-16 ENCOUNTER — Other Ambulatory Visit (HOSPITAL_COMMUNITY): Payer: Self-pay | Admitting: Hematology & Oncology

## 2017-02-16 ENCOUNTER — Encounter (HOSPITAL_COMMUNITY): Payer: Self-pay | Admitting: Emergency Medicine

## 2017-02-16 ENCOUNTER — Emergency Department (HOSPITAL_COMMUNITY): Payer: Medicare Other

## 2017-02-16 ENCOUNTER — Inpatient Hospital Stay (HOSPITAL_COMMUNITY)
Admission: EM | Admit: 2017-02-16 | Discharge: 2017-02-23 | DRG: 190 | Disposition: A | Discharge status: Skilled Nursing Facility | Payer: Medicare Other | Attending: Internal Medicine | Admitting: Internal Medicine

## 2017-02-16 DIAGNOSIS — F17210 Nicotine dependence, cigarettes, uncomplicated: Secondary | ICD-10-CM | POA: Diagnosis present

## 2017-02-16 DIAGNOSIS — E784 Other hyperlipidemia: Secondary | ICD-10-CM | POA: Diagnosis not present

## 2017-02-16 DIAGNOSIS — J9601 Acute respiratory failure with hypoxia: Secondary | ICD-10-CM | POA: Diagnosis present

## 2017-02-16 DIAGNOSIS — E785 Hyperlipidemia, unspecified: Secondary | ICD-10-CM | POA: Diagnosis present

## 2017-02-16 DIAGNOSIS — J441 Chronic obstructive pulmonary disease with (acute) exacerbation: Principal | ICD-10-CM | POA: Diagnosis present

## 2017-02-16 DIAGNOSIS — Z79899 Other long term (current) drug therapy: Secondary | ICD-10-CM | POA: Diagnosis not present

## 2017-02-16 DIAGNOSIS — Z885 Allergy status to narcotic agent status: Secondary | ICD-10-CM

## 2017-02-16 DIAGNOSIS — R739 Hyperglycemia, unspecified: Secondary | ICD-10-CM | POA: Diagnosis present

## 2017-02-16 DIAGNOSIS — G629 Polyneuropathy, unspecified: Secondary | ICD-10-CM | POA: Diagnosis present

## 2017-02-16 DIAGNOSIS — Z66 Do not resuscitate: Secondary | ICD-10-CM | POA: Diagnosis present

## 2017-02-16 DIAGNOSIS — K219 Gastro-esophageal reflux disease without esophagitis: Secondary | ICD-10-CM | POA: Diagnosis present

## 2017-02-16 DIAGNOSIS — F419 Anxiety disorder, unspecified: Secondary | ICD-10-CM | POA: Diagnosis present

## 2017-02-16 DIAGNOSIS — F329 Major depressive disorder, single episode, unspecified: Secondary | ICD-10-CM | POA: Diagnosis present

## 2017-02-16 DIAGNOSIS — Z85818 Personal history of malignant neoplasm of other sites of lip, oral cavity, and pharynx: Secondary | ICD-10-CM | POA: Diagnosis not present

## 2017-02-16 DIAGNOSIS — T502X5A Adverse effect of carbonic-anhydrase inhibitors, benzothiadiazides and other diuretics, initial encounter: Secondary | ICD-10-CM | POA: Diagnosis present

## 2017-02-16 DIAGNOSIS — R7302 Impaired glucose tolerance (oral): Secondary | ICD-10-CM | POA: Diagnosis present

## 2017-02-16 DIAGNOSIS — M5136 Other intervertebral disc degeneration, lumbar region: Secondary | ICD-10-CM | POA: Diagnosis present

## 2017-02-16 DIAGNOSIS — I10 Essential (primary) hypertension: Secondary | ICD-10-CM | POA: Diagnosis present

## 2017-02-16 DIAGNOSIS — Z720 Tobacco use: Secondary | ICD-10-CM | POA: Diagnosis not present

## 2017-02-16 DIAGNOSIS — Z888 Allergy status to other drugs, medicaments and biological substances status: Secondary | ICD-10-CM

## 2017-02-16 DIAGNOSIS — E871 Hypo-osmolality and hyponatremia: Secondary | ICD-10-CM | POA: Diagnosis present

## 2017-02-16 DIAGNOSIS — R0902 Hypoxemia: Secondary | ICD-10-CM

## 2017-02-16 DIAGNOSIS — F17200 Nicotine dependence, unspecified, uncomplicated: Secondary | ICD-10-CM

## 2017-02-16 HISTORY — DX: Malignant (primary) neoplasm, unspecified: C80.1

## 2017-02-16 LAB — DIFFERENTIAL
Basophils Absolute: 0.1 10*3/uL (ref 0.0–0.1)
Basophils Relative: 1 %
EOS ABS: 0 10*3/uL (ref 0.0–0.7)
EOS PCT: 0 %
LYMPHS ABS: 1 10*3/uL (ref 0.7–4.0)
Lymphocytes Relative: 13 %
MONOS PCT: 9 %
Monocytes Absolute: 0.8 10*3/uL (ref 0.1–1.0)
Neutro Abs: 6.4 10*3/uL (ref 1.7–7.7)
Neutrophils Relative %: 77 %

## 2017-02-16 LAB — CBC
HEMATOCRIT: 40 % (ref 36.0–46.0)
HEMOGLOBIN: 13.6 g/dL (ref 12.0–15.0)
MCH: 30.4 pg (ref 26.0–34.0)
MCHC: 34 g/dL (ref 30.0–36.0)
MCV: 89.3 fL (ref 78.0–100.0)
Platelets: 230 10*3/uL (ref 150–400)
RBC: 4.48 MIL/uL (ref 3.87–5.11)
RDW: 14.2 % (ref 11.5–15.5)
WBC: 8.2 10*3/uL (ref 4.0–10.5)

## 2017-02-16 LAB — TROPONIN I: Troponin I: <0.03 ng/mL (ref <0.03)

## 2017-02-16 LAB — COMPREHENSIVE METABOLIC PANEL
ALBUMIN: 4.4 g/dL (ref 3.5–5.0)
ALT: 23 U/L (ref 14–54)
AST: 28 U/L (ref 15–41)
Alkaline Phosphatase: 66 U/L (ref 38–126)
Anion gap: 8 (ref 5–15)
BUN: 10 mg/dL (ref 6–20)
CO2: 29 mmol/L (ref 22–32)
CREATININE: 0.6 mg/dL (ref 0.44–1.00)
Calcium: 9.2 mg/dL (ref 8.9–10.3)
Chloride: 92 mmol/L — ABNORMAL LOW (ref 101–111)
GFR calc Af Amer: >60 mL/min (ref >60)
GFR calc non Af Amer: >60 mL/min (ref >60)
GLUCOSE: 150 mg/dL — AB (ref 65–99)
POTASSIUM: 3.4 mmol/L — AB (ref 3.5–5.1)
Sodium: 129 mmol/L — ABNORMAL LOW (ref 135–145)
Total Bilirubin: 0.4 mg/dL (ref 0.3–1.2)
Total Protein: 7.4 g/dL (ref 6.5–8.1)

## 2017-02-16 LAB — I-STAT CG4 LACTIC ACID, ED: Lactic Acid, Venous: 0.94 mmol/L (ref 0.5–1.9)

## 2017-02-16 MED ORDER — METHYLPREDNISOLONE SODIUM SUCC 40 MG IJ SOLR
40.0000 mg | Freq: Four times a day (QID) | INTRAMUSCULAR | Status: DC
  Administered 2017-02-16 – 2017-02-19 (×11): 40 mg via INTRAVENOUS
  Filled 2017-02-16 (×11): qty 1

## 2017-02-16 MED ORDER — GABAPENTIN 100 MG PO CAPS
100.0000 mg | ORAL_CAPSULE | Freq: Every day | ORAL | Status: DC
  Administered 2017-02-17 – 2017-02-23 (×7): 100 mg via ORAL
  Filled 2017-02-16 (×7): qty 1

## 2017-02-16 MED ORDER — KCL IN DEXTROSE-NACL 40-5-0.9 MEQ/L-%-% IV SOLN
INTRAVENOUS | Status: DC
Start: 2017-02-16 — End: 2017-02-17
  Administered 2017-02-16 – 2017-02-17 (×2): via INTRAVENOUS

## 2017-02-16 MED ORDER — AMLODIPINE BESYLATE 5 MG PO TABS
5.0000 mg | ORAL_TABLET | Freq: Every day | ORAL | Status: DC
  Administered 2017-02-17 – 2017-02-23 (×7): 5 mg via ORAL
  Filled 2017-02-16 (×7): qty 1

## 2017-02-16 MED ORDER — ALBUTEROL SULFATE (2.5 MG/3ML) 0.083% IN NEBU
2.5000 mg | INHALATION_SOLUTION | RESPIRATORY_TRACT | Status: DC | PRN
  Administered 2017-02-17: 2.5 mg via RESPIRATORY_TRACT
  Filled 2017-02-16: qty 3

## 2017-02-16 MED ORDER — PANTOPRAZOLE SODIUM 40 MG PO TBEC
40.0000 mg | DELAYED_RELEASE_TABLET | Freq: Every day | ORAL | Status: DC
  Administered 2017-02-17 – 2017-02-23 (×7): 40 mg via ORAL
  Filled 2017-02-16 (×7): qty 1

## 2017-02-16 MED ORDER — TIZANIDINE HCL 4 MG PO TABS
2.0000 mg | ORAL_TABLET | Freq: Two times a day (BID) | ORAL | Status: DC | PRN
  Administered 2017-02-19 (×2): 2 mg via ORAL
  Filled 2017-02-16 (×2): qty 1

## 2017-02-16 MED ORDER — MOMETASONE FURO-FORMOTEROL FUM 200-5 MCG/ACT IN AERO
2.0000 | INHALATION_SPRAY | Freq: Two times a day (BID) | RESPIRATORY_TRACT | Status: DC
  Administered 2017-02-17: 2 via RESPIRATORY_TRACT
  Filled 2017-02-16: qty 8.8

## 2017-02-16 MED ORDER — POTASSIUM CHLORIDE CRYS ER 20 MEQ PO TBCR
40.0000 meq | EXTENDED_RELEASE_TABLET | Freq: Once | ORAL | Status: AC
  Administered 2017-02-16: 40 meq via ORAL
  Filled 2017-02-16: qty 2

## 2017-02-16 MED ORDER — ATORVASTATIN CALCIUM 20 MG PO TABS
20.0000 mg | ORAL_TABLET | Freq: Every day | ORAL | Status: DC
  Administered 2017-02-17 – 2017-02-23 (×7): 20 mg via ORAL
  Filled 2017-02-16 (×7): qty 1

## 2017-02-16 MED ORDER — CITALOPRAM HYDROBROMIDE 20 MG PO TABS
20.0000 mg | ORAL_TABLET | Freq: Every day | ORAL | Status: DC
  Administered 2017-02-17 – 2017-02-23 (×7): 20 mg via ORAL
  Filled 2017-02-16 (×7): qty 1

## 2017-02-16 MED ORDER — ALBUTEROL SULFATE (2.5 MG/3ML) 0.083% IN NEBU
2.5000 mg | INHALATION_SOLUTION | Freq: Four times a day (QID) | RESPIRATORY_TRACT | Status: DC
  Administered 2017-02-17 (×3): 2.5 mg via RESPIRATORY_TRACT
  Filled 2017-02-16 (×3): qty 3

## 2017-02-16 MED ORDER — IPRATROPIUM-ALBUTEROL 0.5-2.5 (3) MG/3ML IN SOLN
3.0000 mL | Freq: Once | RESPIRATORY_TRACT | Status: AC
  Administered 2017-02-16: 3 mL via RESPIRATORY_TRACT
  Filled 2017-02-16: qty 3

## 2017-02-16 MED ORDER — METHYLPREDNISOLONE SODIUM SUCC 125 MG IJ SOLR
125.0000 mg | Freq: Once | INTRAMUSCULAR | Status: AC
  Administered 2017-02-16: 125 mg via INTRAVENOUS
  Filled 2017-02-16: qty 2

## 2017-02-16 MED ORDER — ENOXAPARIN SODIUM 40 MG/0.4ML ~~LOC~~ SOLN
40.0000 mg | SUBCUTANEOUS | Status: DC
  Administered 2017-02-16: 40 mg via SUBCUTANEOUS
  Filled 2017-02-16: qty 0.4

## 2017-02-16 MED ORDER — ACETAMINOPHEN 500 MG PO TABS
500.0000 mg | ORAL_TABLET | Freq: Four times a day (QID) | ORAL | Status: DC | PRN
  Administered 2017-02-17 – 2017-02-22 (×6): 500 mg via ORAL
  Filled 2017-02-16 (×6): qty 1

## 2017-02-16 MED ORDER — MAGNESIUM SULFATE 2 GM/50ML IV SOLN
2.0000 g | Freq: Once | INTRAVENOUS | Status: AC
  Administered 2017-02-16: 2 g via INTRAVENOUS
  Filled 2017-02-16: qty 50

## 2017-02-16 MED ORDER — ALPRAZOLAM 0.5 MG PO TABS
0.5000 mg | ORAL_TABLET | Freq: Two times a day (BID) | ORAL | Status: DC | PRN
  Administered 2017-02-17 – 2017-02-23 (×12): 0.5 mg via ORAL
  Filled 2017-02-16 (×13): qty 1

## 2017-02-16 MED ORDER — LEVOFLOXACIN IN D5W 750 MG/150ML IV SOLN
750.0000 mg | INTRAVENOUS | Status: DC
  Administered 2017-02-16: 750 mg via INTRAVENOUS
  Filled 2017-02-16: qty 150

## 2017-02-16 NOTE — H&P (Signed)
History and Physical    [name] VPX:[id number] DOB: 1946-01-01 DOA: 02/16/2017  PCP: [name], MD  Patient coming from: Home.    Chief Complaint:  SOB and wheezing.   HPI: Lindsey Combs is an 71 y.o. female with hx of longstanding and active cigarette abuse, prior home oxygen, anxiety, severe COPD, HLD, HTN, prior hyponatremia on diuretics, presented to the ER with increase SOB, wheezing and progressive DOE.  Evaluation in the ER showed CXR with no infiltrate, with no leukocytosis, and normal renal fx.  She was found to have hypoxia even at rest, and supplement oxygen was given to her.  She was given nebs, but still has wheezing, and hospitalist was asked to admit her for COPD exacerbation.   ED Course:  See above.  Rewiew of Systems:  Constitutional: Negative for malaise, fever and chills. No significant weight loss or weight gain Eyes: Negative for eye pain, redness and discharge, diplopia, visual changes, or flashes of light. ENMT: Negative for ear pain, hoarseness, nasal congestion, sinus pressure and sore throat. No headaches; tinnitus, drooling, or problem swallowing. Cardiovascular: Negative for chest pain, palpitations, diaphoresis, dyspnea and peripheral edema. ; No orthopnea, PND Respiratory: Negative for cough, hemoptysis, wheezing and stridor. No pleuritic chestpain. Gastrointestinal: Negative for diarrhea, constipation,  melena, blood in stool, hematemesis, jaundice and rectal bleeding.    Genitourinary: Negative for frequency, dysuria, incontinence,flank pain and hematuria; Musculoskeletal: Negative for back pain and neck pain. Negative for swelling and trauma.;  Skin: . Negative for pruritus, rash, abrasions, bruising and skin lesion.; ulcerations Neuro: Negative for headache, lightheadedness and neck stiffness. Negative for weakness, altered level of consciousness , altered mental status, extremity weakness, burning feet, involuntary movement, seizure and syncope.   Psych: negative for anxiety, depression, insomnia, tearfulness, panic attacks, hallucinations, paranoia, suicidal or homicidal ideation    Past Medical History:  Diagnosis Date  . Acute respiratory failure with hypoxia ([place])   . Anxiety   . Cancer (HCC)    uvular per pt  . COPD (chronic obstructive pulmonary disease) ([place])   . DDD (degenerative disc disease), lumbar   . Degenerative joint disease of spine   . Diverticulosis   . GERD (gastroesophageal reflux disease)   . H. pylori infection    treated 02/2013.  [name] History of UTI   . Hypercholesterolemia   . Hypertension   . Hyponatremia   . Peripheral neuropathy   . Shortness of breath    with exertion  . Tobacco abuse     Past Surgical History:  Procedure Laterality Date  . ABDOMINAL EXPLORATION SURGERY     age 63, large ovarian cyst  . BIOPSY N/A 03/01/2013   IOE:VOJJ hiatal hernia; otherwise, normal examination/ Status post biopsies as described above. SB bx negative for Celiac. +h/pylori  . CATARACT EXTRACTION W/ INTRAOCULAR LENS  IMPLANT, BILATERAL    . COLONOSCOPY  10/16/09   Jenkins:3 polypsin the sigmoid colon/polyps in the descending colon and the rectum/scattered diverticulum. hyperplastic  . COLONOSCOPY WITH ESOPHAGOGASTRODUODENOSCOPY (EGD) N/A 03/01/2013   KKX:[id number] colonic polyps - treated/removed as described above. Colonic diverticulosis. Hyperplastic. Next TCS 02/2018.  [name] DIRECT LARYNGOSCOPY N/A 06/08/2016   Procedure: DIRECT LARYNGOSCOPY AND BIOPSY;  Surgeon: Leta Baptist, MD;  Location: [facility] OR;  Service: ENT;  Laterality: N/A;  . PARTIAL HYSTERECTOMY     age 37     reports that she has been smoking Cigarettes.  She has a 75.00 pack-year smoking history. She has never used smokeless tobacco. She reports  that she does not drink alcohol or use drugs.  Allergies  Allergen Reactions  . Chantix [Varenicline] Other (See Comments)    Mental status changes  . Codeine Itching    Family History  Problem Relation Age  of Onset  . Leukemia Father   . Thyroid disease Father   . Stomach cancer Paternal Grandfather   . Colon cancer Neg Hx      Prior to Admission medications   Medication Sig Start Date End Date Taking? Authorizing Provider  acetaminophen (TYLENOL) 500 MG tablet Take 500 mg by mouth every 6 (six) hours as needed for mild pain.   Yes [provider]  albuterol (PROAIR HFA) 108 (90 BASE) MCG/ACT inhaler Inhale 2 puffs into the lungs every 4 (four) hours as needed for wheezing or shortness of breath. 04/06/15  Yes Black, Lezlie Octave, NP  ALPRAZolam Duanne Moron) 0.5 MG tablet 2 (two) times daily as needed for anxiety.  10/29/16  Yes [provider]  amLODipine (NORVASC) 5 MG tablet TAKE 1 TABLET BY MOUTH ONCE A DAY. 01/16/17  Yes Kefalas, Manon Hilding, PA-C  atorvastatin (LIPITOR) 20 MG tablet Take 20 mg by mouth daily.   Yes [provider]  escitalopram (LEXAPRO) 5 MG tablet Take 1 tablet by mouth daily. 01/23/17  Yes [provider]  Esomeprazole Magnesium (NEXIUM PO) Take 1 capsule by mouth daily.   Yes [provider]  gabapentin (NEURONTIN) 100 MG capsule Take 1 capsule by mouth daily. 01/23/17  Yes [provider]  hydrochlorothiazide (HYDRODIURIL) 12.5 MG tablet Take 1 tablet by mouth daily. 11/19/16  Yes [provider]  omeprazole (PRILOSEC) 20 MG capsule Take 1 capsule by mouth daily. 02/13/17  Yes [provider]  ranitidine (ZANTAC) 150 MG tablet Take 150 mg by mouth 2 (two) times daily.    Yes [provider]  SYMBICORT 160-4.5 MCG/ACT inhaler Inhale 2 puffs into the lungs 2 (two) times daily. 08/29/16  Yes [provider]  tiotropium (SPIRIVA) 18 MCG inhalation capsule Place 18 mcg into inhaler and inhale daily as needed. Shortness of breath   Yes [provider]  tiZANidine (ZANAFLEX) 2 MG tablet Take 2 mg by mouth 2 (two) times daily as needed for muscle spasms.   Yes [provider]    Physical  Exam: Vitals:   02/16/17 2005 02/16/17 2014 02/16/17 2030 02/16/17 2045  BP:   (!) 179/72   Pulse:  95 95 92  Resp:  20 (!) 21 (!) 21  Temp:      TempSrc:      SpO2: 100% 96% 93% 100%  Weight:      Height:          Constitutional: NAD, calm, comfortable Vitals:   02/16/17 2005 02/16/17 2014 02/16/17 2030 02/16/17 2045  BP:   (!) 179/72   Pulse:  95 95 92  Resp:  20 (!) 21 (!) 21  Temp:      TempSrc:      SpO2: 100% 96% 93% 100%  Weight:      Height:       Eyes: PERRL, lids and conjunctivae normal ENMT: Mucous membranes are moist. Posterior pharynx clear of any exudate or lesions.Normal dentition.  Neck: normal, supple, no masses, no thyromegaly Respiratory: bilateral wheezing, no crackles. Normal respiratory effort. No accessory muscle use.  Cardiovascular: Regular rate and rhythm, no murmurs / rubs / gallops. No extremity edema. 2+ pedal pulses. No carotid bruits.  Abdomen: no tenderness, no masses palpated.  No hepatosplenomegaly. Bowel sounds positive.  Musculoskeletal: no clubbing / cyanosis. No joint deformity upper and lower extremities. Good ROM, no contractures. Normal muscle tone.  Skin: no rashes, lesions, ulcers. No induration Neurologic: CN 2-12 grossly intact. Sensation intact, DTR normal. Strength 5/5 in all 4.  Psychiatric: Normal judgment and insight. Alert and oriented x 3. Normal mood.    Labs on Admission: I have personally reviewed following labs and imaging studies CBC:  Recent Labs Lab 02/16/17 1756  WBC 8.2  NEUTROABS 6.4  HGB 13.6  HCT 40.0  MCV 89.3  PLT 528   Basic Metabolic Panel:  Recent Labs Lab 02/16/17 1756  NA 129*  K 3.4*  CL 92*  CO2 29  GLUCOSE 150*  BUN 10  CREATININE 0.60  CALCIUM 9.2   GFR: Estimated Creatinine Clearance: 45.6 mL/min (by C-G formula based on SCr of 0.6 mg/dL). Liver Function Tests:  Recent Labs Lab 02/16/17 1756  AST 28  ALT 23  ALKPHOS 66  BILITOT 0.4  PROT 7.4  ALBUMIN 4.4   Cardiac  Enzymes:  Recent Labs Lab 02/16/17 1756  TROPONINI <0.03   Urine analysis:    Component Value Date/Time   COLORURINE YELLOW 09/21/2016 0246   APPEARANCEUR CLEAR 09/21/2016 0246   LABSPEC 1.013 09/21/2016 0246   PHURINE 6.0 09/21/2016 0246   GLUCOSEU NEGATIVE 09/21/2016 0246   HGBUR MODERATE (A) 09/21/2016 0246   BILIRUBINUR NEGATIVE 09/21/2016 0246   KETONESUR 5 (A) 09/21/2016 0246   PROTEINUR 100 (A) 09/21/2016 0246   UROBILINOGEN 0.2 04/04/2015 1918   NITRITE NEGATIVE 09/21/2016 0246   LEUKOCYTESUR NEGATIVE 09/21/2016 0246    Recent Results (from the past 240 hour(s))  Culture, blood (routine x 2)     Status: None (Preliminary result)   Collection Time: 02/16/17  6:42 PM  Result Value Ref Range Status   Specimen Description BLOOD RIGHT FOREARM  Final   Special Requests   Final    BOTTLES DRAWN AEROBIC AND ANAEROBIC Blood Culture results may not be optimal due to an inadequate volume of blood received in culture bottles   Culture PENDING  Incomplete   Report Status PENDING  Incomplete  Culture, blood (routine x 2)     Status: None (Preliminary result)   Collection Time: 02/16/17  6:44 PM  Result Value Ref Range Status   Specimen Description BLOOD RIGHT HAND  Final   Special Requests   Final    BOTTLES DRAWN AEROBIC AND ANAEROBIC Blood Culture results may not be optimal due to an inadequate volume of blood received in culture bottles   Culture PENDING  Incomplete   Report Status PENDING  Incomplete     Radiological Exams on Admission: Dg Chest 2 View  Result Date: 02/16/2017 CLINICAL DATA:  71 y/o  F; shortness of breath. EXAM: CHEST  2 VIEW COMPARISON:  09/20/2016 chest radiograph FINDINGS: Stable normal cardiac silhouette. Aortic atherosclerosis with calcification. Hyperinflated lungs with flattened diaphragms compatible with COPD. No focal consolidation. No pleural effusion or pneumothorax. Bones are unremarkable. IMPRESSION: No active cardiopulmonary disease.  Electronically Signed   By: Kristine Garbe M.D.   On: 02/16/2017 18:32    EKG: Independently reviewed.   Assessment/Plan Principal Problem:   COPD with acute exacerbation (HCC) Active Problems:   COPD exacerbation (HCC)   Hyponatremia   Hyperlipidemia   Hypertension   Tobacco abuse   PLAN:   COPD exacerbation:  Unfortunately, it doesn't look like she will stop smoking.  Will admit her and continue with  IV Steorids.  She likely will need home oxygen.  Will start IV antibiotics along with nebs.  Hypovolemia Hypnatremia;  Will give IV Normal saline and follow lytes daily.  She shouldn't be on diuretics.  Tobacco abuse:  Advised stop.  Low likelihood that she will.  HLD:  Continue with meds.  HTN:  Stable.   DVT prophylaxis: subQ. Code Status: DNR confirmed tonight.  Family Communication: son at bedside.  Disposition Plan: Home. Consults called: None. Admission status: inpatient.    Sharnay Cashion MD FACP. [facility]  If 7PM-7AM, please contact night-coverage [email or web address] Password [email or web address]  02/16/2017, 9:07 PM

## 2017-02-16 NOTE — ED Notes (Signed)
Respiratory called for neb tx

## 2017-02-16 NOTE — ED Triage Notes (Signed)
Pt reports increasing shortness of breath since yesterday with congested cough.  Feels like intermittent fever with no temp checked.  Has checked o2 sat at home and was below 80% all day today.

## 2017-02-16 NOTE — ED Notes (Signed)
Pt desat to 79-80% off O2 while in bed talking to visitors, O2 applied back to 2 L/M via [place], EDP made aware, new order received for 3rd neb tx and respiratory called for it.

## 2017-02-16 NOTE — ED Provider Notes (Signed)
[facility] DEPT Provider Note   CSN: [id number] Arrival date & time: 02/16/17  1736     History   Chief Complaint Chief Complaint  Patient presents with  . Shortness of Breath    HPI CIRIA BERNARDINI is a 71 y.o. female.  The history is provided by the patient.  She has a history of COPD and cancer of the uvula. She is status post uvulectomy. Yesterday, she noted some difficulty breathing and cough productive of thick white sputum. She had subjective fever and chills and night sweats. She denies any chest pain. She has been using her pro-air inhaler without any relief. Symptoms got worse today. She checked her oxygen level today and it was at 78%. Of note, she admits to ongoing tobacco use.  Past Medical History:  Diagnosis Date  . Acute respiratory failure with hypoxia ([place])   . Anxiety   . Cancer (HCC)    uvular per pt  . COPD (chronic obstructive pulmonary disease) ([place])   . DDD (degenerative disc disease), lumbar   . Degenerative joint disease of spine   . Diverticulosis   . GERD (gastroesophageal reflux disease)   . H. pylori infection    treated 02/2013.  [name] History of UTI   . Hypercholesterolemia   . Hypertension   . Hyponatremia   . Peripheral neuropathy   . Shortness of breath    with exertion  . Tobacco abuse     Patient Active Problem List   Diagnosis Date Noted  . Malnutrition of moderate degree 09/22/2016  . Hypomagnesemia 09/21/2016  . Tobacco use 08/03/2016  . Hyperlipidemia 08/03/2016  . HTN (hypertension) 08/03/2016  . History of Helicobacter pylori infection 08/03/2016  . History of anemia 08/03/2016  . Diverticulosis 08/03/2016  . Arthritis 08/03/2016  . 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 Chronic obstructive pulmonary disease with acute exacerbation ([place])   . Acute respiratory failure with hypoxia ([place]) 04/03/2015  . COPD exacerbation ([place]) 04/03/2015  . Hyponatremia 04/03/2015  . Dehydration 04/03/2015  . Hyperglycemia 04/03/2015    . Anxiety   . COPD (chronic obstructive pulmonary disease) ([place])   . Peripheral neuropathy ([place])   . Tobacco dependence   . Back pain, chronic 04/19/2013  . Abnormal weight loss 02/19/2013  . Anemia, iron deficiency 02/19/2013  . GERD (gastroesophageal reflux disease) 02/19/2013    Past Surgical History:  Procedure Laterality Date  . ABDOMINAL EXPLORATION SURGERY     age 48, large ovarian cyst  . BIOPSY N/A 03/01/2013   YQM:VHQI hiatal hernia; otherwise, normal examination/ Status post biopsies as described above. SB bx negative for Celiac. +h/pylori  . CATARACT EXTRACTION W/ INTRAOCULAR LENS  IMPLANT, BILATERAL    . COLONOSCOPY  10/16/09   Jenkins:3 polypsin the sigmoid colon/polyps in the descending colon and the rectum/scattered diverticulum. hyperplastic  . COLONOSCOPY WITH ESOPHAGOGASTRODUODENOSCOPY (EGD) N/A 03/01/2013   ONG:EXBMWUXL colonic polyps - treated/removed as described above. Colonic diverticulosis. Hyperplastic. Next TCS 02/2018.  [name] DIRECT LARYNGOSCOPY N/A 06/08/2016   Procedure: DIRECT LARYNGOSCOPY AND BIOPSY;  Surgeon: Leta Baptist, MD;  Location: [facility] OR;  Service: ENT;  Laterality: N/A;  . PARTIAL HYSTERECTOMY     age 1    OB History    Gravida Para Term Preterm AB Living   5 4 4   1      SAB TAB Ectopic Multiple Live Births   1               Home Medications    Prior  to Admission medications   Medication Sig Start Date End Date Taking? Authorizing Provider  acetaminophen (TYLENOL) 500 MG tablet Take 500 mg by mouth every 6 (six) hours as needed for mild pain.    [provider]  albuterol (PROAIR HFA) 108 (90 BASE) MCG/ACT inhaler Inhale 2 puffs into the lungs every 4 (four) hours as needed for wheezing or shortness of breath. 04/06/15   Black, Lezlie Octave, NP  ALPRAZolam Duanne Moron) 0.25 MG tablet  10/29/16   [provider]  amLODipine (NORVASC) 5 MG tablet TAKE 1 TABLET BY MOUTH ONCE A DAY. 01/16/17   Baird Cancer, PA-C  atorvastatin (LIPITOR) 20 MG  tablet Take 20 mg by mouth daily.    [provider]  ranitidine (ZANTAC) 150 MG tablet Take 150 mg by mouth at bedtime.    [provider]  SYMBICORT 160-4.5 MCG/ACT inhaler Inhale 2 puffs into the lungs 2 (two) times daily. 08/29/16   [provider]  tiotropium (SPIRIVA) 18 MCG inhalation capsule Place 18 mcg into inhaler and inhale daily as needed. Shortness of breath    [provider]    Family History Family History  Problem Relation Age of Onset  . Leukemia Father   . Thyroid disease Father   . Stomach cancer Paternal Grandfather   . Colon cancer Neg Hx     Social History Social History  Substance Use Topics  . Smoking status: Current Every Day Smoker    Packs/day: 1.50    Years: 50.00    Types: Cigarettes  . Smokeless tobacco: Never Used  . Alcohol use No     Comment: glass of wine occasionally     Allergies   Chantix [varenicline] and Codeine   Review of Systems Review of Systems  All other systems reviewed and are negative.    Physical Exam Updated Vital Signs BP (!) 183/76 (BP Location: Left Arm)   Pulse 90   Temp 97.6 F (36.4 C) (Oral)   Resp (!) 32   Ht 5\' 5"  (1.651 m)   Wt 45.4 kg (100 lb)   SpO2 (!) 80%   BMI 16.64 kg/m   Physical Exam  Nursing note and vitals reviewed.  71 year old female, in mild respiratory distress. She is using accessory muscles of respiration, but able to speak in complete sentences. Vital signs are significant for hypertension and tachypnea. Oxygen saturation is 80%, which is hypoxic. She is placed on oxygen via nasal cannula, and oxygen saturation has improved to 100%. Head is normocephalic and atraumatic. PERRLA, EOMI. Oropharynx is clear. Neck is nontender and supple without adenopathy or JVD. Back is nontender and there is no CVA tenderness. Lungs have diffuse inspiratory and expiratory wheezes with moderate reduction in airflow. There are no rales or rhonchi. Chest is  nontender. Heart has regular rate and rhythm without murmur. Abdomen is soft, flat, nontender without masses or hepatosplenomegaly and peristalsis is normoactive. Extremities have no cyanosis or edema, full range of motion is present. Skin is warm and dry without rash. Neurologic: Mental status is normal, cranial nerves are intact, there are no motor or sensory deficits.  ED Treatments / Results  Labs (all labs ordered are listed, but only abnormal results are displayed) Labs Reviewed  COMPREHENSIVE METABOLIC PANEL - Abnormal; Notable for the following:       Result Value   Sodium 129 (*)    Potassium 3.4 (*)    Chloride 92 (*)    Glucose, Bld 150 (*)  All other components within normal limits  CULTURE, BLOOD (ROUTINE X 2)  CULTURE, BLOOD (ROUTINE X 2)  CBC  TROPONIN I  DIFFERENTIAL  I-STAT CG4 LACTIC ACID, ED    EKG  EKG Interpretation  Date/Time:  Thursday Feb 16 2017 17:48:03 EDT Ventricular Rate:  93 PR Interval:    QRS Duration: 100 QT Interval:  383 QTC Calculation: 477 R Axis:   89 Text Interpretation:  Sinus rhythm Borderline right axis deviation RSR' in V1 or V2, probably normal variant Abnormal lateral Q waves Minimal ST depression, lateral leads When compared with ECG of 09/20/2016, QT has shortened Confirmed by Delora Fuel ([id number]) on 02/16/2017 7:22:59 PM       Radiology Dg Chest 2 View  Result Date: 02/16/2017 CLINICAL DATA:  71 y/o  F; shortness of breath. EXAM: CHEST  2 VIEW COMPARISON:  09/20/2016 chest radiograph FINDINGS: Stable normal cardiac silhouette. Aortic atherosclerosis with calcification. Hyperinflated lungs with flattened diaphragms compatible with COPD. No focal consolidation. No pleural effusion or pneumothorax. Bones are unremarkable. IMPRESSION: No active cardiopulmonary disease. Electronically Signed   By: Kristine Garbe M.D.   On: 02/16/2017 18:32    Procedures Procedures (including critical care time) CRITICAL  CARE Performed by: VOHYW,VPXTG Total critical care time: 40 minutes Critical care time was exclusive of separately billable procedures and treating other patients. Critical care was necessary to treat or prevent imminent or life-threatening deterioration. Critical care was time spent personally by me on the following activities: development of treatment plan with patient and/or surrogate as well as nursing, discussions with consultants, evaluation of patient's response to treatment, examination of patient, obtaining history from patient or surrogate, ordering and performing treatments and interventions, ordering and review of laboratory studies, ordering and review of radiographic studies, pulse oximetry and re-evaluation of patient's condition.  Medications Ordered in ED Medications  magnesium sulfate IVPB 2 g 50 mL (2 g Intravenous New Bag/Given 02/16/17 2042)  ipratropium-albuterol (DUONEB) 0.5-2.5 (3) MG/3ML nebulizer solution 3 mL (3 mLs Nebulization Given 02/16/17 1809)  methylPREDNISolone sodium succinate (SOLU-MEDROL) 125 mg/2 mL injection 125 mg (125 mg Intravenous Given 02/16/17 1830)  ipratropium-albuterol (DUONEB) 0.5-2.5 (3) MG/3ML nebulizer solution 3 mL (3 mLs Nebulization Given 02/16/17 1939)  potassium chloride SA (K-DUR,KLOR-CON) CR tablet 40 mEq (40 mEq Oral Given 02/16/17 1932)  ipratropium-albuterol (DUONEB) 0.5-2.5 (3) MG/3ML nebulizer solution 3 mL (3 mLs Nebulization Given 02/16/17 2004)     Initial Impression / Assessment and Plan / ED Course  I have reviewed the triage vital signs and the nursing notes.  Pertinent labs & imaging results that were available during my care of the patient were reviewed by me and considered in my medical decision making (see chart for details).  COPD exacerbation with moderately severe hypoxia. Chest x-ray is ordered to rule out pneumonia. Screening labs are ordered. Old records are reviewed confirming uvulectomy done last November, and  outpatient management of COPD. She did have a hospitalization in December for COPD exacerbation and hyponatremia. Main reason for hospitalization was hyponatremia, not COPD.  Patient had little improvement with 3 nebulizer treatments. She continued to use accessory muscles of respiration. When taken off of oxygen, oxygen saturations dropped to 79%. Case is discussed with Dr. Marin Comment [facility], who agrees to admit the patient.  Final Clinical Impressions(s) / ED Diagnoses   Final diagnoses:  COPD exacerbation ([place])  Hypoxia  Smoker  Hyponatremia    New Prescriptions New Prescriptions   No medications on file  Delora Fuel, MD 76/14/70 2051

## 2017-02-17 DIAGNOSIS — E871 Hypo-osmolality and hyponatremia: Secondary | ICD-10-CM

## 2017-02-17 LAB — BASIC METABOLIC PANEL
ANION GAP: 7 (ref 5–15)
BUN: 11 mg/dL (ref 6–20)
CHLORIDE: 94 mmol/L — AB (ref 101–111)
CO2: 28 mmol/L (ref 22–32)
Calcium: 9.2 mg/dL (ref 8.9–10.3)
Creatinine, Ser: 0.62 mg/dL (ref 0.44–1.00)
GFR calc Af Amer: >60 mL/min (ref >60)
GLUCOSE: 172 mg/dL — AB (ref 65–99)
POTASSIUM: 5.1 mmol/L (ref 3.5–5.1)
Sodium: 129 mmol/L — ABNORMAL LOW (ref 135–145)

## 2017-02-17 MED ORDER — IPRATROPIUM-ALBUTEROL 0.5-2.5 (3) MG/3ML IN SOLN
3.0000 mL | RESPIRATORY_TRACT | Status: DC | PRN
  Administered 2017-02-19 – 2017-02-23 (×4): 3 mL via RESPIRATORY_TRACT
  Filled 2017-02-17 (×3): qty 3

## 2017-02-17 MED ORDER — SODIUM CHLORIDE 0.9 % IV SOLN
INTRAVENOUS | Status: DC
  Filled 2017-02-17 (×2): qty 1000

## 2017-02-17 MED ORDER — NICOTINE 14 MG/24HR TD PT24
14.0000 mg | MEDICATED_PATCH | Freq: Every day | TRANSDERMAL | Status: DC
  Administered 2017-02-17 – 2017-02-23 (×8): 14 mg via TRANSDERMAL
  Filled 2017-02-17 (×8): qty 1

## 2017-02-17 MED ORDER — BUDESONIDE 0.25 MG/2ML IN SUSP
0.2500 mg | Freq: Two times a day (BID) | RESPIRATORY_TRACT | Status: DC
  Administered 2017-02-17 – 2017-02-21 (×7): 0.25 mg via RESPIRATORY_TRACT
  Filled 2017-02-17 (×7): qty 2

## 2017-02-17 MED ORDER — ENOXAPARIN SODIUM 30 MG/0.3ML ~~LOC~~ SOLN
30.0000 mg | SUBCUTANEOUS | Status: DC
  Administered 2017-02-17 – 2017-02-22 (×6): 30 mg via SUBCUTANEOUS
  Filled 2017-02-17 (×6): qty 0.3

## 2017-02-17 MED ORDER — IPRATROPIUM-ALBUTEROL 0.5-2.5 (3) MG/3ML IN SOLN
3.0000 mL | Freq: Four times a day (QID) | RESPIRATORY_TRACT | Status: DC
  Administered 2017-02-17 – 2017-02-19 (×7): 3 mL via RESPIRATORY_TRACT
  Filled 2017-02-17 (×8): qty 3

## 2017-02-17 MED ORDER — SODIUM CHLORIDE 0.9 % IV SOLN
INTRAVENOUS | Status: DC
  Administered 2017-02-17 – 2017-02-18 (×2): via INTRAVENOUS

## 2017-02-17 MED ORDER — ORAL CARE MOUTH RINSE
15.0000 mL | Freq: Two times a day (BID) | OROMUCOSAL | Status: DC
  Administered 2017-02-17 – 2017-02-23 (×12): 15 mL via OROMUCOSAL

## 2017-02-17 MED ORDER — LEVOFLOXACIN IN D5W 750 MG/150ML IV SOLN
750.0000 mg | INTRAVENOUS | Status: DC
  Administered 2017-02-18: 750 mg via INTRAVENOUS
  Filled 2017-02-17: qty 150

## 2017-02-17 NOTE — Care Management Note (Addendum)
Case Management Note  Patient Details  Name: Lindsey Combs MRN: [id number] Date of Birth: [birth year]  Subjective/Objective:   Adm with COPD exacerbation. From home alone, ind with ADL's. She has had home O2 in the past with [place] but not currently. She reports she has a hard time with the large O2 tanks. We discussed [facility] for O2 needs if she needs oxygen again. She would have to go home with large tank and then be assessed for smaller tanks after discharge. She has PCP, still drives, reports no issues affording medications.               Action/Plan: Anticipate DC home with self care. Will need home 02 eval prior to DC. CM will follow.   ADDENDUM: CM spoke with [facility] ([facility]) regarding oxygen for patient. They can deliver oxygen over the weekend if patient discharges. If patient discharges on Sunday, new qualifying home O2 eval will need to be done. Eval note and order can be faxed to [phone]. [facility] Ace Gins) phone # [phone].    Expected Discharge Date:       02/20/2017           Expected Discharge Plan:  Home/Self Care  In-House Referral:     Discharge planning Services  CM Consult  Post Acute Care Choice:    Choice offered to:     DME Arranged:    DME Agency:     HH Arranged:    HH Agency:     Status of Service:  In process, will continue to follow  If discussed at Long Length of Stay Meetings, dates discussed:    Additional Comments:  Ihor Meinzer, Chauncey Reading, RN 02/17/2017, 9:46 AM

## 2017-02-17 NOTE — Progress Notes (Signed)
SATURATION QUALIFICATIONS: (This note is used to comply with regulatory documentation for home oxygen)  Patient Saturations on Room Air at Rest = 85 %  Patient Saturations on Room Air while Ambulating = 78 %  Patient Saturations on 3.5 Liters of oxygen while Ambulating = 97%  Please briefly explain why patient needs home oxygen:

## 2017-02-17 NOTE — Care Management Important Message (Signed)
Important Message  Patient Details  Name: Lindsey Combs MRN: [id number] Date of Birth: September 24, 1946   Medicare Important Message Given:  Yes    Aijalon Kirtz, Chauncey Reading, RN 02/17/2017, 9:51 AM

## 2017-02-17 NOTE — Progress Notes (Signed)
PROGRESS NOTE  Lindsey Combs JKK:[id number] DOB: 03-11-1946 DOA: 02/16/2017 PCP: [name], MD  Brief History:  71 year old female with a history of cancer of the uvula status post uvulectomy COPD, hyperlipidemia, hypertension, tobacco abuse presented with one-day history of increasing shortness breath, wheezing, and productive cough with white sputum. Unfortunately, the patient continues to smoke 1/2 pack per day. She has had a nearly 70-pack-year history. She used her bronchodilators to home without success. She checked her oxygen saturation at home and noted to be 78% on room air. As a result, she presented to the emergency department. Chest x-ray was negative for any infiltrates.  The patient was admitted for treatment of COPD exacerbation and started on intravenous steroids and bronchodilators.  Assessment/Plan: Acute respiratory failure with hypoxia -Presently stable on 3 L nasal cannula -Pulmonary hygiene -Will need to go home on oxygen -Patient had oxygen saturation desaturation with ambulation -Wean oxygen for saturation greater than 92%  COPD exacerbation -Add Pulmicort -Add Atrovent nebulizer -Continue Solu-Medrol -Continue levofloxacin  Hyponatremia -This has been chronic -In part due to HCTZ, Lexapro as well as poor solute intake -Discontinued HCTZ -continue IVF -am BMP  Essential hypertension -Continue amlodipine  Tobacco abuse -Tobacco cessation discussed -NicoDerm patch  Hyperlipidemia -Continue statin  Depression/anxiety -Continue Celexa and her home dose alprazolam  Hyperglycemia -Check hemoglobin A1c    Disposition Plan:   Home in 1-2 days  Family Communication:  No Family at bedside--Total time spent 35 minutes.  Greater than 50% spent face to face counseling and coordinating care.   Consultants:  none  Code Status:  FULL   DVT Prophylaxis: [place] Lovenox   Procedures: As Listed in Progress Note  Above  Antibiotics: None    Subjective: Patient states that she is breathing better but still has dyspnea with moderate exertion. Denies any fevers, chills, headache, chest pain, nausea, vomiting, diarrhea, gallop. No dysuria or hematuria. No rashes.  Objective: Vitals:   02/17/17 1113 02/17/17 1418 02/17/17 1428 02/17/17 1430  BP:    (!) 118/48  Pulse:    74  Resp:    (!) 21  Temp:    98.5 F (36.9 C)  TempSrc:    Oral  SpO2: 96% 99% 97% 100%  Weight:      Height:        Intake/Output Summary (Last 24 hours) at 02/17/17 1820 Last data filed at 02/17/17 1430  Gross per 24 hour  Intake             1590 ml  Output             2530 ml  Net             -940 ml   Weight change:  Exam:   General:  Pt is alert, follows commands appropriately, not in acute distress  HEENT: No icterus, No thrush, No neck mass, [place]/AT  Cardiovascular: RRR, S1/S2, no rubs, no gallops  Respiratory: Diminished breath sounds bilateral. Mild bibasilar wheezing.  Abdomen: Soft/+BS, non tender, non distended, no guarding  Extremities: No edema, No lymphangitis, No petechiae, No rashes, no synovitis   Data Reviewed: I have personally reviewed following labs and imaging studies Basic Metabolic Panel:  Recent Labs Lab 02/16/17 1756 02/17/17 0558  NA 129* 129*  K 3.4* 5.1  CL 92* 94*  CO2 29 28  GLUCOSE 150* 172*  BUN 10 11  CREATININE 0.60 0.62  CALCIUM 9.2 9.2   Liver  Function Tests:  Recent Labs Lab 02/16/17 1756  AST 28  ALT 23  ALKPHOS 66  BILITOT 0.4  PROT 7.4  ALBUMIN 4.4   No results for input(s): LIPASE, AMYLASE in the last 168 hours. No results for input(s): AMMONIA in the last 168 hours. Coagulation Profile: No results for input(s): INR, PROTIME in the last 168 hours. CBC:  Recent Labs Lab 02/16/17 1756  WBC 8.2  NEUTROABS 6.4  HGB 13.6  HCT 40.0  MCV 89.3  PLT 230   Cardiac Enzymes:  Recent Labs Lab 02/16/17 1756  TROPONINI <0.03    BNP: Invalid input(s): POCBNP CBG: No results for input(s): GLUCAP in the last 168 hours. HbA1C: No results for input(s): HGBA1C in the last 72 hours. Urine analysis:    Component Value Date/Time   COLORURINE YELLOW 09/21/2016 0246   APPEARANCEUR CLEAR 09/21/2016 0246   LABSPEC 1.013 09/21/2016 0246   PHURINE 6.0 09/21/2016 0246   GLUCOSEU NEGATIVE 09/21/2016 0246   HGBUR MODERATE (A) 09/21/2016 0246   BILIRUBINUR NEGATIVE 09/21/2016 0246   KETONESUR 5 (A) 09/21/2016 0246   PROTEINUR 100 (A) 09/21/2016 0246   UROBILINOGEN 0.2 04/04/2015 1918   NITRITE NEGATIVE 09/21/2016 0246   LEUKOCYTESUR NEGATIVE 09/21/2016 0246   Sepsis Labs: @LABRCNTIP (procalcitonin:4,lacticidven:4) ) Recent Results (from the past 240 hour(s))  Culture, blood (routine x 2)     Status: None (Preliminary result)   Collection Time: 02/16/17  6:42 PM  Result Value Ref Range Status   Specimen Description BLOOD RIGHT FOREARM  Final   Special Requests   Final    BOTTLES DRAWN AEROBIC AND ANAEROBIC Blood Culture results may not be optimal due to an inadequate volume of blood received in culture bottles   Culture NO GROWTH < 24 HOURS  Final   Report Status PENDING  Incomplete  Culture, blood (routine x 2)     Status: None (Preliminary result)   Collection Time: 02/16/17  6:44 PM  Result Value Ref Range Status   Specimen Description BLOOD RIGHT HAND  Final   Special Requests   Final    BOTTLES DRAWN AEROBIC AND ANAEROBIC Blood Culture results may not be optimal due to an inadequate volume of blood received in culture bottles   Culture NO GROWTH < 24 HOURS  Final   Report Status PENDING  Incomplete     Scheduled Meds: . amLODipine  5 mg Oral Daily  . atorvastatin  20 mg Oral Daily  . budesonide (PULMICORT) nebulizer solution  0.25 mg Nebulization BID  . citalopram  20 mg Oral Daily  . enoxaparin (LOVENOX) injection  30 mg Subcutaneous Q24H  . gabapentin  100 mg Oral Daily  . ipratropium-albuterol  3  mL Nebulization Q6H  . mouth rinse  15 mL Mouth Rinse BID  . methylPREDNISolone (SOLU-MEDROL) injection  40 mg Intravenous Q6H  . nicotine  14 mg Transdermal Daily  . pantoprazole  40 mg Oral Daily   Continuous Infusions: . [START ON 02/18/2017] levofloxacin (LEVAQUIN) IV    . sodium chloride 0.9 % 1,000 mL infusion      Procedures/Studies: Dg Chest 2 View  Result Date: 02/16/2017 CLINICAL DATA:  71 y/o  F; shortness of breath. EXAM: CHEST  2 VIEW COMPARISON:  09/20/2016 chest radiograph FINDINGS: Stable normal cardiac silhouette. Aortic atherosclerosis with calcification. Hyperinflated lungs with flattened diaphragms compatible with COPD. No focal consolidation. No pleural effusion or pneumothorax. Bones are unremarkable. IMPRESSION: No active cardiopulmonary disease. Electronically Signed   By: Edgardo Roys.D.  On: 02/16/2017 18:32    Candee Hoon, DO  [facility] Pager [phone]  If 7PM-7AM, please contact night-coverage [email or web address] Password [email or web address] 02/17/2017, 6:20 PM   LOS: 1 day

## 2017-02-18 LAB — BASIC METABOLIC PANEL
ANION GAP: 7 (ref 5–15)
BUN: 10 mg/dL (ref 6–20)
CHLORIDE: 98 mmol/L — AB (ref 101–111)
CO2: 30 mmol/L (ref 22–32)
CREATININE: 0.5 mg/dL (ref 0.44–1.00)
Calcium: 9.7 mg/dL (ref 8.9–10.3)
GFR calc non Af Amer: >60 mL/min (ref >60)
Glucose, Bld: 138 mg/dL — ABNORMAL HIGH (ref 65–99)
POTASSIUM: 5.3 mmol/L — AB (ref 3.5–5.1)
Sodium: 135 mmol/L (ref 135–145)

## 2017-02-18 LAB — MAGNESIUM: MAGNESIUM: 2.2 mg/dL (ref 1.7–2.4)

## 2017-02-18 MED ORDER — SODIUM CHLORIDE 0.9 % IV SOLN
INTRAVENOUS | Status: AC
  Filled 2017-02-18 (×2): qty 1000

## 2017-02-18 MED ORDER — ARFORMOTEROL TARTRATE 15 MCG/2ML IN NEBU
15.0000 ug | INHALATION_SOLUTION | Freq: Two times a day (BID) | RESPIRATORY_TRACT | Status: DC
  Administered 2017-02-18 – 2017-02-23 (×7): 15 ug via RESPIRATORY_TRACT
  Filled 2017-02-18 (×7): qty 2

## 2017-02-18 NOTE — Progress Notes (Signed)
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 Unfortunately, the patient continues to smoke 1/2 pack per day. She has had a nearly 70-pack-year history. She used her bronchodilators to home without success. She checked her oxygen saturation at home and noted to be 78% on room air. As a result, she presented to the emergency department. Chest x-ray was negative for any infiltrates.  The patient was admitted for treatment of COPD exacerbation and started on intravenous steroids and bronchodilators.  Assessment/Plan: Acute respiratory failure with hypoxia -Presently stable on 3 L nasal cannula -Pulmonary hygiene -Will need to go home on oxygen -Patient had oxygen saturation desaturation with ambulation -Wean oxygen for saturation greater than 92%  COPD exacerbation -Add Pulmicort--increase to 0.5 mg bid -Continue Duonebs -Continue Solu-Medrol -Continue levofloxacin -add Brovana -PT evaluation  Hyponatremia -This has been chronic -In part due to HCTZ, Lexapro as well as poor solute intake -Discontinued HCTZ -continue IVF-->improving -am BMP  Essential hypertension -Continue amlodipine  Tobacco abuse -Tobacco cessation discussed -NicoDerm patch  Hyperlipidemia -Continue statin  Depression/anxiety -Continue Celexa and her home dose alprazolam  Hyperglycemia -Check hemoglobin A1c--pending     Disposition Plan:   Home in 1-2 days  Family Communication:  No Family at bedside   Consultants:  none  Code Status:  FULL   DVT Prophylaxis: [place] Lovenox   Procedures: As Listed in Progress Note Above  Antibiotics: Levofloxacin  5/24>>>  Subjective: Patient states that she is breathing 50% better but is still having significant dyspnea on exertion with mild exertion. Denies any fevers, chills, chest pain, nausea, vomiting, diarrhea, abdominal pain, dysuria, hematuria patient continues to have a nonproductive cough without hemoptysis.  Objective: Vitals:   02/18/17 0743 02/18/17 0747 02/18/17 1328 02/18/17 1400  BP:    (!) 125/44  Pulse:    76  Resp:    19  Temp:    97.5 F (36.4 C)  TempSrc:    Axillary  SpO2: 99% 99% 98% 100%  Weight:      Height:        Intake/Output Summary (Last 24 hours) at 02/18/17 1732 Last data filed at 02/18/17 1538  Gross per 24 hour  Intake          1666.25 ml  Output             2250 ml  Net          -583.75 ml   Weight change:  Exam:   General:  Pt is alert, follows commands appropriately, not in acute distress  HEENT: No icterus, No thrush, No neck mass, [place]/AT  Cardiovascular: RRR, S1/S2, no rubs, no gallops  Respiratory:Bilateral rales with bilateral expiratory wheeze. Good air movement.  Abdomen: Soft/+BS, non tender, non distended, no guarding  Extremities: No edema, No lymphangitis, No petechiae, No rashes, no synovitis   Data Reviewed: I have personally reviewed following labs and imaging studies Basic Metabolic Panel:  Recent Labs Lab 02/16/17 1756 02/17/17 0558 02/18/17 0715  NA 129* 129* 135  K 3.4* 5.1 5.3*  CL 92* 94* 98*  CO2 29 28 30   GLUCOSE 150* 172* 138*  BUN 10 11 10   CREATININE 0.60 0.62 0.50  CALCIUM 9.2  9.2 9.7  MG  --   --  2.2   Liver Function Tests:  Recent Labs Lab 02/16/17 1756  AST 28  ALT 23  ALKPHOS 66  BILITOT 0.4  PROT 7.4  ALBUMIN 4.4   No results for input(s): LIPASE, AMYLASE in the last 168 hours. No results for input(s): AMMONIA in the last 168 hours. Coagulation Profile: No results for input(s): INR, PROTIME in the last 168 hours. CBC:  Recent Labs Lab 02/16/17 1756  WBC 8.2  NEUTROABS 6.4   HGB 13.6  HCT 40.0  MCV 89.3  PLT 230   Cardiac Enzymes:  Recent Labs Lab 02/16/17 1756  TROPONINI <0.03   BNP: Invalid input(s): POCBNP CBG: No results for input(s): GLUCAP in the last 168 hours. HbA1C: No results for input(s): HGBA1C in the last 72 hours. Urine analysis:    Component Value Date/Time   COLORURINE YELLOW 09/21/2016 0246   APPEARANCEUR CLEAR 09/21/2016 0246   LABSPEC 1.013 09/21/2016 0246   PHURINE 6.0 09/21/2016 0246   GLUCOSEU NEGATIVE 09/21/2016 0246   HGBUR MODERATE (A) 09/21/2016 0246   BILIRUBINUR NEGATIVE 09/21/2016 0246   KETONESUR 5 (A) 09/21/2016 0246   PROTEINUR 100 (A) 09/21/2016 0246   UROBILINOGEN 0.2 04/04/2015 1918   NITRITE NEGATIVE 09/21/2016 0246   LEUKOCYTESUR NEGATIVE 09/21/2016 0246   Sepsis Labs: @LABRCNTIP (procalcitonin:4,lacticidven:4) ) Recent Results (from the past 240 hour(s))  Culture, blood (routine x 2)     Status: None (Preliminary result)   Collection Time: 02/16/17  6:42 PM  Result Value Ref Range Status   Specimen Description BLOOD RIGHT FOREARM  Final   Special Requests   Final    BOTTLES DRAWN AEROBIC AND ANAEROBIC Blood Culture results may not be optimal due to an inadequate volume of blood received in culture bottles   Culture NO GROWTH 2 DAYS  Final   Report Status PENDING  Incomplete  Culture, blood (routine x 2)     Status: None (Preliminary result)   Collection Time: 02/16/17  6:44 PM  Result Value Ref Range Status   Specimen Description BLOOD RIGHT HAND  Final   Special Requests   Final    BOTTLES DRAWN AEROBIC AND ANAEROBIC Blood Culture results may not be optimal due to an inadequate volume of blood received in culture bottles   Culture NO GROWTH 2 DAYS  Final   Report Status PENDING  Incomplete     Scheduled Meds: . amLODipine  5 mg Oral Daily  . atorvastatin  20 mg Oral Daily  . budesonide (PULMICORT) nebulizer solution  0.25 mg Nebulization BID  . citalopram  20 mg Oral Daily  . enoxaparin  (LOVENOX) injection  30 mg Subcutaneous Q24H  . gabapentin  100 mg Oral Daily  . ipratropium-albuterol  3 mL Nebulization Q6H  . mouth rinse  15 mL Mouth Rinse BID  . methylPREDNISolone (SOLU-MEDROL) injection  40 mg Intravenous Q6H  . nicotine  14 mg Transdermal Daily  . pantoprazole  40 mg Oral Daily   Continuous Infusions: . sodium chloride 75 mL/hr at 02/18/17 0457  . levofloxacin (LEVAQUIN) IV      Procedures/Studies: Dg Chest 2 View  Result Date: 02/16/2017 CLINICAL DATA:  71 y/o  F; shortness of breath. EXAM: CHEST  2 VIEW COMPARISON:  09/20/2016 chest radiograph FINDINGS: Stable normal cardiac silhouette. Aortic atherosclerosis with calcification. Hyperinflated lungs with flattened diaphragms compatible with COPD. No focal consolidation. No pleural effusion or pneumothorax. Bones are unremarkable. IMPRESSION: No active cardiopulmonary disease. Electronically Signed  By: Kristine Garbe M.D.   On: 02/16/2017 18:32    Willy Pinkerton, DO  [facility] Pager [phone]  If 7PM-7AM, please contact night-coverage [email or web address] Password [email or web address] 02/18/2017, 5:32 PM   LOS: 2 days

## 2017-02-19 LAB — BASIC METABOLIC PANEL
ANION GAP: 4 — AB (ref 5–15)
BUN: 14 mg/dL (ref 6–20)
CALCIUM: 9.3 mg/dL (ref 8.9–10.3)
CO2: 35 mmol/L — ABNORMAL HIGH (ref 22–32)
Chloride: 95 mmol/L — ABNORMAL LOW (ref 101–111)
Creatinine, Ser: 0.5 mg/dL (ref 0.44–1.00)
Glucose, Bld: 109 mg/dL — ABNORMAL HIGH (ref 65–99)
POTASSIUM: 4.4 mmol/L (ref 3.5–5.1)
Sodium: 134 mmol/L — ABNORMAL LOW (ref 135–145)

## 2017-02-19 LAB — MAGNESIUM: MAGNESIUM: 2 mg/dL (ref 1.7–2.4)

## 2017-02-19 LAB — HEMOGLOBIN A1C
Hgb A1c MFr Bld: 5.7 % — ABNORMAL HIGH (ref 4.8–5.6)
Mean Plasma Glucose: 117 mg/dL

## 2017-02-19 MED ORDER — ONDANSETRON HCL 4 MG/2ML IJ SOLN
4.0000 mg | Freq: Four times a day (QID) | INTRAMUSCULAR | Status: DC | PRN
  Administered 2017-02-19: 4 mg via INTRAVENOUS
  Filled 2017-02-19: qty 2

## 2017-02-19 MED ORDER — SODIUM CHLORIDE 0.9 % IV SOLN
INTRAVENOUS | Status: DC
  Administered 2017-02-19 (×2): via INTRAVENOUS

## 2017-02-19 MED ORDER — IPRATROPIUM-ALBUTEROL 0.5-2.5 (3) MG/3ML IN SOLN
3.0000 mL | RESPIRATORY_TRACT | Status: DC
  Administered 2017-02-19: 3 mL via RESPIRATORY_TRACT
  Filled 2017-02-19: qty 3

## 2017-02-19 MED ORDER — METHYLPREDNISOLONE SODIUM SUCC 40 MG IJ SOLR
40.0000 mg | Freq: Four times a day (QID) | INTRAMUSCULAR | Status: DC
  Administered 2017-02-19 – 2017-02-20 (×2): 40 mg via INTRAVENOUS
  Filled 2017-02-19 (×2): qty 1

## 2017-02-19 NOTE — Progress Notes (Signed)
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 Unfortunately, the patient continues to smoke 1/2pack per day. She has had a nearly 70-pack-year history. She used her bronchodilators to home without success. She checked her oxygen saturation at home and noted to be 78% on room air. As a result, she presented to the emergency department. Chest x-ray was negative for any infiltrates. The patient was admitted for treatment of COPD exacerbation and started on intravenous steroids and bronchodilators.  Assessment/Plan: Acute respiratory failure with hypoxia -Presently stable on 3L nasal cannula -Pulmonary hygiene -Willneed to go home on oxygen -Patient had oxygen saturation desaturation with ambulation -Wean oxygen for saturation greater than 92% -improving but remains dyspneic with mild exertion  COPD exacerbation -Add Pulmicort--increase to 0.5 mg bid -Continue Duonebs -Continue Solu-Medrol -Continue levofloxacin -add Brovana -PT evaluation  Hyponatremia -This has been chronic -In part due to HCTZ, Lexapro as well as poor solute intake -Discontinued HCTZ -continue IVF-->improving -am BMP  Essential hypertension -Continue amlodipine  Tobacco abuse -Tobacco cessation discussed -NicoDerm patch  Hyperlipidemia -Continue statin  Depression/anxiety -Continue Celexa and her home dose alprazolam  Impaired glucose tolerance -Check hemoglobin A1c--5.7     Disposition Plan: Home 5/28 or 5/29 Family Communication: NoFamily at bedside   Consultants: none  Code Status: FULL   DVT Prophylaxis: [place] Lovenox   Procedures: As Listed  in Progress Note Above  Antibiotics: Levofloxacin 5/24>>>    Subjective: Patient is breathing better, but still having dyspnea with moderate exertion. Denies any headache, chest pain, nausea, vomiting, diarrhea, cognitive dysuria or hematuria.  Objective: Vitals:   02/19/17 0901 02/19/17 1400 02/19/17 1434 02/19/17 1751  BP: (!) 163/59 (!) 180/66    Pulse:  89    Resp:  20    Temp:  97.5 F (36.4 C)    TempSrc:  Oral    SpO2:  96% 98% 93%  Weight:      Height:        Intake/Output Summary (Last 24 hours) at 02/19/17 1954 Last data filed at 02/19/17 1700  Gross per 24 hour  Intake             2160 ml  Output             3530 ml  Net            -1370 ml   Weight change:  Exam:   General:  Pt is alert, follows commands appropriately, not in acute distress  HEENT: No icterus, No thrush, No neck mass, [place]/AT  Cardiovascular: RRR, S1/S2, no rubs, no gallops  Respiratory: Bibasilar rales. Bilateral scattered wheeze. Good air movement.  Abdomen: Soft/+BS, non tender, non distended, no guarding  Extremities: No edema, No lymphangitis, No petechiae, No rashes, no synovitis   Data Reviewed: I have personally reviewed following labs and imaging studies Basic Metabolic Panel:  Recent Labs Lab 02/16/17 1756 02/17/17 0558 02/18/17 0715 02/19/17 0611  NA 129* 129* 135 134*  K 3.4* 5.1 5.3* 4.4  CL 92* 94* 98* 95*  CO2 29 28 30  35*  GLUCOSE 150* 172* 138* 109*  BUN 10 11 10 14   CREATININE 0.60 0.62 0.50 0.50  CALCIUM 9.2 9.2 9.7 9.3  MG  --   --  2.2 2.0   Liver Function Tests:  Recent Labs Lab 02/16/17 1756  AST 28  ALT 23  ALKPHOS 66  BILITOT 0.4  PROT 7.4  ALBUMIN 4.4   No results for input(s): LIPASE, AMYLASE in the last 168 hours. No results for input(s): AMMONIA in the last 168 hours. Coagulation Profile: No results for input(s): INR, PROTIME in the last 168 hours. CBC:  Recent Labs Lab 02/16/17 1756  WBC 8.2  NEUTROABS 6.4  HGB 13.6    HCT 40.0  MCV 89.3  PLT 230   Cardiac Enzymes:  Recent Labs Lab 02/16/17 1756  TROPONINI <0.03   BNP: Invalid input(s): POCBNP CBG: No results for input(s): GLUCAP in the last 168 hours. HbA1C:  Recent Labs  02/18/17 0715  HGBA1C 5.7*   Urine analysis:    Component Value Date/Time   COLORURINE YELLOW 09/21/2016 0246   APPEARANCEUR CLEAR 09/21/2016 0246   LABSPEC 1.013 09/21/2016 0246   PHURINE 6.0 09/21/2016 0246   GLUCOSEU NEGATIVE 09/21/2016 0246   HGBUR MODERATE (A) 09/21/2016 0246   BILIRUBINUR NEGATIVE 09/21/2016 0246   KETONESUR 5 (A) 09/21/2016 0246   PROTEINUR 100 (A) 09/21/2016 0246   UROBILINOGEN 0.2 04/04/2015 1918   NITRITE NEGATIVE 09/21/2016 0246   LEUKOCYTESUR NEGATIVE 09/21/2016 0246   Sepsis Labs: @LABRCNTIP (procalcitonin:4,lacticidven:4) ) Recent Results (from the past 240 hour(s))  Culture, blood (routine x 2)     Status: None (Preliminary result)   Collection Time: 02/16/17  6:42 PM  Result Value Ref Range Status   Specimen Description BLOOD RIGHT FOREARM  Final   Special Requests   Final    BOTTLES DRAWN AEROBIC AND ANAEROBIC Blood Culture results may not be optimal due to an inadequate volume of blood received in culture bottles   Culture NO GROWTH 3 DAYS  Final   Report Status PENDING  Incomplete  Culture, blood (routine x 2)     Status: None (Preliminary result)   Collection Time: 02/16/17  6:44 PM  Result Value Ref Range Status   Specimen Description BLOOD RIGHT HAND  Final   Special Requests   Final    BOTTLES DRAWN AEROBIC AND ANAEROBIC Blood Culture results may not be optimal due to an inadequate volume of blood received in culture bottles   Culture NO GROWTH 3 DAYS  Final   Report Status PENDING  Incomplete     Scheduled Meds: . amLODipine  5 mg Oral Daily  . arformoterol  15 mcg Nebulization BID  . atorvastatin  20 mg Oral Daily  . budesonide (PULMICORT) nebulizer solution  0.25 mg Nebulization BID  . citalopram  20 mg  Oral Daily  . enoxaparin (LOVENOX) injection  30 mg Subcutaneous Q24H  . gabapentin  100 mg Oral Daily  . ipratropium-albuterol  3 mL Nebulization Q4H  . mouth rinse  15 mL Mouth Rinse BID  . methylPREDNISolone (SOLU-MEDROL) injection  40 mg Intravenous Q6H  . nicotine  14 mg Transdermal Daily  . pantoprazole  40 mg Oral Daily   Continuous Infusions: . sodium chloride 75 mL/hr at 02/19/17 0855  . levofloxacin (LEVAQUIN) IV Stopped (02/19/17 0055)    Procedures/Studies: Dg Chest 2 View  Result Date: 02/16/2017 CLINICAL DATA:  71 y/o  F; shortness of breath. EXAM: CHEST  2 VIEW COMPARISON:  09/20/2016 chest radiograph FINDINGS: Stable normal cardiac silhouette. Aortic atherosclerosis with calcification. Hyperinflated lungs with flattened diaphragms compatible with COPD. No focal consolidation. No pleural effusion or pneumothorax. Bones  are unremarkable. IMPRESSION: No active cardiopulmonary disease. Electronically Signed   By: Kristine Garbe M.D.   On: 02/16/2017 18:32    Gregorey Nabor, DO  [facility] Pager [phone]  If 7PM-7AM, please contact night-coverage [email or web address] Password [email or web address] 02/19/2017, 7:54 PM   LOS: 3 days

## 2017-02-20 DIAGNOSIS — E784 Other hyperlipidemia: Secondary | ICD-10-CM

## 2017-02-20 MED ORDER — PREDNISONE 20 MG PO TABS: 60.0000 mg | ORAL_TABLET | Freq: Every day | ORAL | Status: DC

## 2017-02-20 MED ORDER — PREDNISONE 20 MG PO TABS: 40.0000 mg | ORAL_TABLET | Freq: Every day | ORAL | Status: DC

## 2017-02-20 MED ORDER — METHYLPREDNISOLONE SODIUM SUCC 40 MG IJ SOLR
40.0000 mg | Freq: Once | INTRAMUSCULAR | Status: AC
  Administered 2017-02-20: 40 mg via INTRAVENOUS
  Filled 2017-02-20: qty 1

## 2017-02-20 MED ORDER — LEVOFLOXACIN 750 MG PO TABS
750.0000 mg | ORAL_TABLET | Freq: Once | ORAL | Status: AC
Start: 2017-02-20 — End: 2017-02-20
  Administered 2017-02-20: 750 mg via ORAL
  Filled 2017-02-20: qty 1

## 2017-02-20 MED ORDER — IPRATROPIUM-ALBUTEROL 0.5-2.5 (3) MG/3ML IN SOLN: 3.0000 mL | Freq: Four times a day (QID) | RESPIRATORY_TRACT | Status: DC

## 2017-02-20 MED ORDER — IPRATROPIUM-ALBUTEROL 0.5-2.5 (3) MG/3ML IN SOLN
3.0000 mL | Freq: Three times a day (TID) | RESPIRATORY_TRACT | Status: DC
  Administered 2017-02-21 – 2017-02-23 (×8): 3 mL via RESPIRATORY_TRACT
  Filled 2017-02-20 (×8): qty 3

## 2017-02-20 NOTE — Progress Notes (Signed)
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 Unfortunately, the patient continues to smoke 1/2pack per day. She has had a nearly 70-pack-year history. She used her bronchodilators to home without success. She checked her oxygen saturation at home and noted to be 78% on room air. As a result, she presented to the emergency department. Chest x-ray was negative for any infiltrates. The patient was admitted for treatment of COPD exacerbation and started on intravenous steroids and bronchodilators.  Assessment/Plan: Acute respiratory failure with hypoxia -Presently stable on 3L nasal cannula -Pulmonary hygiene -Willneed to go home on oxygen -Patient had oxygen saturation desaturation with ambulation -Wean oxygen for saturation greater than 92% -improving but remains dyspneic with mild exertion  COPD exacerbation -Add Pulmicort--increase to 0.5 mg bid -Continue Duonebs -Continue Solu-Medrol-->transition to po prednisone -Continue levofloxacin--finish 5 days -continue Brovana -PT evaluation--patient refused  Hyponatremia -This has been chronic -In part due to HCTZ, Lexapro as well as poor solute intake -Discontinued HCTZ -continue IVF-->improving -am BMP  Essential hypertension -Continue amlodipine  Tobacco abuse -Tobacco cessation discussed -NicoDerm patch  Hyperlipidemia -Continue statin  Depression/anxiety -Continue Celexa and her home dose alprazolam  Impaired glucose tolerance -Check hemoglobin A1c--5.7     Disposition Plan: Home 5/29 if stable Family Communication: NoFamily at bedside   Consultants: none  Code Status:  FULL   DVT Prophylaxis: [place] Lovenox   Procedures: As Listed in Progress Note Above  Antibiotics: Levofloxacin 5/24>>>    Subjective: Patient remains dyspneic with mild exertion but overall breathing better. Denies any fevers, chest, chest pain, nausea, vomiting, diarrhea, bone pain. No dysuria or hematuria. No rashes  Objective: Vitals:   02/19/17 2249 02/20/17 0616 02/20/17 1004 02/20/17 1604  BP: (!) 90/46 (!) 150/60  (!) 160/62  Pulse: 69 93  81  Resp: 18 18  20   Temp: 97.8 F (36.6 C) 98.1 F (36.7 C)  98.6 F (37 C)  TempSrc: Oral Oral    SpO2: 100% 100% 98% 95%  Weight:      Height:        Intake/Output Summary (Last 24 hours) at 02/20/17 1746 Last data filed at 02/20/17 1033  Gross per 24 hour  Intake                0 ml  Output             1000 ml  Net            -1000 ml   Weight change:  Exam:   General:  Pt is alert, follows commands appropriately, not in acute distress  HEENT: No icterus, No thrush, No neck mass, [place]/AT  Cardiovascular: RRR, S1/S2, no rubs, no gallops  Respiratory: Bibasilar crackles. Mild bibasilar wheezes. Good air movement  Abdomen: Soft/+BS, non tender, non distended, no guarding  Extremities: No edema, No lymphangitis, No petechiae, No rashes, no synovitis   Data Reviewed: I have personally reviewed following labs and imaging studies Basic Metabolic Panel:  Recent Labs Lab 02/16/17 1756 02/17/17 0558 02/18/17 0715 02/19/17 0611  NA 129* 129* 135 134*  K 3.4* 5.1 5.3* 4.4  CL 92* 94* 98* 95*  CO2 29 28 30  35*  GLUCOSE 150* 172* 138* 109*  BUN 10 11 10 14   CREATININE 0.60 0.62 0.50 0.50  CALCIUM 9.2 9.2 9.7 9.3  MG  --   --  2.2 2.0   Liver Function Tests:  Recent Labs Lab 02/16/17 1756  AST 28  ALT 23  ALKPHOS 66  BILITOT 0.4  PROT 7.4  ALBUMIN 4.4   No results for input(s): LIPASE, AMYLASE in the last 168 hours. No results for input(s): AMMONIA in the last 168 hours. Coagulation Profile: No  results for input(s): INR, PROTIME in the last 168 hours. CBC:  Recent Labs Lab 02/16/17 1756  WBC 8.2  NEUTROABS 6.4  HGB 13.6  HCT 40.0  MCV 89.3  PLT 230   Cardiac Enzymes:  Recent Labs Lab 02/16/17 1756  TROPONINI <0.03   BNP: Invalid input(s): POCBNP CBG: No results for input(s): GLUCAP in the last 168 hours. HbA1C:  Recent Labs  02/18/17 0715  HGBA1C 5.7*   Urine analysis:    Component Value Date/Time   COLORURINE YELLOW 09/21/2016 0246   APPEARANCEUR CLEAR 09/21/2016 0246   LABSPEC 1.013 09/21/2016 0246   PHURINE 6.0 09/21/2016 0246   GLUCOSEU NEGATIVE 09/21/2016 0246   HGBUR MODERATE (A) 09/21/2016 0246   BILIRUBINUR NEGATIVE 09/21/2016 0246   KETONESUR 5 (A) 09/21/2016 0246   PROTEINUR 100 (A) 09/21/2016 0246   UROBILINOGEN 0.2 04/04/2015 1918   NITRITE NEGATIVE 09/21/2016 0246   LEUKOCYTESUR NEGATIVE 09/21/2016 0246   Sepsis Labs: @LABRCNTIP (procalcitonin:4,lacticidven:4) ) Recent Results (from the past 240 hour(s))  Culture, blood (routine x 2)     Status: None (Preliminary result)   Collection Time: 02/16/17  6:42 PM  Result Value Ref Range Status   Specimen Description BLOOD RIGHT FOREARM  Final   Special Requests   Final    BOTTLES DRAWN AEROBIC AND ANAEROBIC Blood Culture results may not be optimal due to an inadequate volume of blood received in culture bottles   Culture NO GROWTH 4 DAYS  Final   Report Status PENDING  Incomplete  Culture, blood (routine x 2)     Status: None (Preliminary result)   Collection Time: 02/16/17  6:44 PM  Result Value Ref Range Status   Specimen Description BLOOD RIGHT HAND  Final   Special Requests   Final    BOTTLES DRAWN AEROBIC AND ANAEROBIC Blood Culture results may not be optimal due to an inadequate volume of blood received in culture bottles   Culture NO GROWTH 4 DAYS  Final   Report Status PENDING  Incomplete     Scheduled Meds: . amLODipine  5 mg Oral Daily  . arformoterol  15 mcg  Nebulization BID  . atorvastatin  20 mg Oral Daily  . budesonide (PULMICORT) nebulizer solution  0.25 mg Nebulization BID  . citalopram  20 mg Oral Daily  . enoxaparin (LOVENOX) injection  30 mg Subcutaneous Q24H  . gabapentin  100 mg Oral Daily  . ipratropium-albuterol  3 mL Nebulization Q6H  . mouth rinse  15 mL Mouth Rinse BID  . nicotine  14 mg Transdermal Daily  . pantoprazole  40 mg Oral Daily  . [START ON 02/21/2017] predniSONE  60 mg Oral Q breakfast   Continuous Infusions: . levofloxacin (LEVAQUIN) IV Stopped (02/19/17 0055)    Procedures/Studies: Dg Chest 2 View  Result Date: 02/16/2017 CLINICAL DATA:  71 y/o  F; shortness of breath. EXAM: CHEST  2 VIEW COMPARISON:  09/20/2016 chest radiograph FINDINGS: Stable normal cardiac silhouette. Aortic atherosclerosis with calcification. Hyperinflated lungs with flattened diaphragms compatible with  COPD. No focal consolidation. No pleural effusion or pneumothorax. Bones are unremarkable. IMPRESSION: No active cardiopulmonary disease. Electronically Signed   By: Kristine Garbe M.D.   On: 02/16/2017 18:32    Laurann Mcmorris, DO  [facility] Pager [phone]  If 7PM-7AM, please contact night-coverage [email or web address] Password [email or web address] 02/20/2017, 5:46 PM   LOS: 4 days

## 2017-02-20 NOTE — Progress Notes (Signed)
SATURATION QUALIFICATIONS: (This note is used to comply with regulatory documentation for home oxygen)  Patient Saturations on Room Air at Rest = 94%  Patient Saturations on Room Air while Ambulating = 86%  Patient Saturations on 3 Liters of oxygen while Ambulating = 91%  Please briefly explain why patient needs home oxygen:

## 2017-02-20 NOTE — Progress Notes (Signed)
PT Cancellation Note  Patient Details Name: Lindsey Combs MRN: [id number] DOB: 04-Feb-1946   Cancelled Treatment:   Attempted to perform skilled PT evaluation, however patient refuses at this time.   Deniece Ree PT, DPT [phone]

## 2017-02-21 ENCOUNTER — Inpatient Hospital Stay (HOSPITAL_COMMUNITY): Payer: Medicare Other

## 2017-02-21 LAB — CULTURE, BLOOD (ROUTINE X 2)
CULTURE: NO GROWTH
CULTURE: NO GROWTH

## 2017-02-21 LAB — TROPONIN I
Troponin I: <0.03 ng/mL (ref <0.03)
Troponin I: <0.03 ng/mL (ref <0.03)

## 2017-02-21 MED ORDER — HYDRALAZINE HCL 20 MG/ML IJ SOLN
10.0000 mg | Freq: Four times a day (QID) | INTRAMUSCULAR | Status: DC | PRN
  Administered 2017-02-21: 10 mg via INTRAVENOUS
  Filled 2017-02-21: qty 1

## 2017-02-21 MED ORDER — LORAZEPAM 2 MG/ML IJ SOLN
0.5000 mg | Freq: Once | INTRAMUSCULAR | Status: AC
  Administered 2017-02-21: 0.5 mg via INTRAVENOUS
  Filled 2017-02-21: qty 1

## 2017-02-21 MED ORDER — BUDESONIDE 0.5 MG/2ML IN SUSP
0.5000 mg | Freq: Two times a day (BID) | RESPIRATORY_TRACT | Status: DC
  Administered 2017-02-21 – 2017-02-23 (×4): 0.5 mg via RESPIRATORY_TRACT
  Filled 2017-02-21 (×4): qty 2

## 2017-02-21 MED ORDER — LABETALOL HCL 5 MG/ML IV SOLN
10.0000 mg | INTRAVENOUS | Status: DC | PRN
  Administered 2017-02-21: 10 mg via INTRAVENOUS
  Filled 2017-02-21: qty 4

## 2017-02-21 MED ORDER — METHYLPREDNISOLONE SODIUM SUCC 40 MG IJ SOLR
40.0000 mg | Freq: Three times a day (TID) | INTRAMUSCULAR | Status: DC
  Administered 2017-02-21 – 2017-02-23 (×7): 40 mg via INTRAVENOUS
  Filled 2017-02-21 (×7): qty 1

## 2017-02-21 MED ORDER — ALUM & MAG HYDROXIDE-SIMETH 200-200-20 MG/5ML PO SUSP
30.0000 mL | ORAL | Status: DC | PRN
  Administered 2017-02-21: 30 mL via ORAL
  Filled 2017-02-21: qty 30

## 2017-02-21 NOTE — Progress Notes (Signed)
PT Cancellation Note  Patient Details Name: MILISSA FESPERMAN MRN: [id number] DOB: [birth year]   Cancelled Treatment:    Reason Eval/Treat Not Completed: Patient's level of consciousness. Chart reviewed, RN consulted: PT evaluation attempted, however patient has been drowsy and somnolent most of morning. PT is unable to arouse patient. Just a few instances of verbal response that are hypophonic and dysarthric. Will attempt PT evaluation again at later date/time as appropriate.    1:22 PM, 02/21/17 Etta Grandchild, PT, DPT Physical Therapist - [place] [phone] (Office)    Buccola,Allan C 02/21/2017, 1:20 PM

## 2017-02-21 NOTE — Progress Notes (Addendum)
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 Unfortunately, the patient continues to smoke 1/2pack per day. She has had a nearly 70-pack-year history. She used her bronchodilators to home without success. She checked her oxygen saturation at home and noted to be 78% on room air. As a result, she presented to the emergency department. Chest x-ray was negative for any infiltrates. The patient was admitted for treatment of COPD exacerbation and started on intravenous steroids and bronchodilators.  On 02/20/2017, the patient's steroids were weaned. Unfortunately, the patient complained of some increased shortness of breath and wheezing on 02/21/2017. As result, intravenous steroids were restarted. On the evening of 02/12/2017, the patient had what appeared to be an anxiety/panic attack. She was given Ativan.  She was also given Mylanta for "heartburn".  Assessment/Plan: Acute respiratory failure with hypoxia -Presently stable on 3L nasal cannula -Pulmonary hygiene -Willneed to go home on oxygen -Patient had oxygen saturation desaturation with ambulation -Wean oxygen for saturation greater than 92% -02/21/2017--increased shortness of breath and wheezing  COPD exacerbation -Add Pulmicort--increase to 0.5 mg bid -Continue Duonebs -Continue Solu-Medrol-->transition to po prednisone-->increase wheeze and sob -Continue levofloxacin--finished 5 days -continue Brovana -PT evaluation--patient refused -02/21/2017--increased shortness of breath and wheezing-->restart IV solu-medrol  Atypical chest pain -CXR -cycle troponins -personally reviewed EKG--sinus rhythm, nonspecific  T-wave change -suspect panic attack/GERD -pt already on home dose prn xanax  Hyponatremia -This has been chronic -In part due to HCTZ, Lexapro as well as poor solute intake -Discontinued HCTZ -continue IVF-->improving -am BMP  Essential hypertension -Continue amlodipine  Tobacco abuse -Tobacco cessation discussed -NicoDerm patch  Hyperlipidemia -Continue statin  Depression/anxiety -Continue Celexa and her home dose alprazolam  Impaired glucose tolerance -Check hemoglobin A1c--5.7     Disposition Plan: Hold discharge--likely SNF 1-2 days Family Communication: spoke with son 5/29-->pt likely needs SNF, but refusing PT   Consultants: none  Code Status: FULL   DVT Prophylaxis: [place] Lovenox   Procedures: As Listed in Progress Note Above  Antibiotics: Levofloxacin 5/24>>>5/28     Subjective: Patient developed heartburn and increasing shortness of breath overnight. She complains of increasing wheezing. Denies any nausea, vomiting, diarrhea, abdominal pain. No dysuria or hematuria. No rashes.  Objective: Vitals:   02/21/17 0622 02/21/17 0758 02/21/17 0801 02/21/17 0804  BP: (!) 119/49     Pulse: 73     Resp: 16     Temp: 97.8 F (36.6 C)     TempSrc: Oral     SpO2: 100% 99% 100% 100%  Weight:      Height:        Intake/Output Summary (Last 24 hours) at 02/21/17 0816 Last data filed at 02/21/17 0600  Gross per 24 hour  Intake              540 ml  Output             1800 ml  Net            -1260 ml   Weight change:  Exam:   General:  Pt is alert, follows commands appropriately, not in acute distress  HEENT: No icterus, No thrush, No neck mass, [place]/AT  Cardiovascular: RRR, S1/S2, no rubs, no gallops  Respiratory: Bibasilar rales. Bilateral expiratory wheeze.  Abdomen: Soft/+BS, non tender, non distended, no guarding  Extremities: No edema, No lymphangitis, No petechiae, No rashes, no synovitis   Data Reviewed: I have  personally reviewed following labs and imaging studies Basic Metabolic Panel:  Recent Labs Lab 02/16/17 1756 02/17/17 0558 02/18/17 0715 02/19/17 0611  NA 129* 129* 135 134*  K 3.4* 5.1 5.3* 4.4  CL 92* 94* 98* 95*  CO2 29 28 30  35*  GLUCOSE 150* 172* 138* 109*  BUN 10 11 10 14   CREATININE 0.60 0.62 0.50 0.50  CALCIUM 9.2 9.2 9.7 9.3  MG  --   --  2.2 2.0   Liver Function Tests:  Recent Labs Lab 02/16/17 1756  AST 28  ALT 23  ALKPHOS 66  BILITOT 0.4  PROT 7.4  ALBUMIN 4.4   No results for input(s): LIPASE, AMYLASE in the last 168 hours. No results for input(s): AMMONIA in the last 168 hours. Coagulation Profile: No results for input(s): INR, PROTIME in the last 168 hours. CBC:  Recent Labs Lab 02/16/17 1756  WBC 8.2  NEUTROABS 6.4  HGB 13.6  HCT 40.0  MCV 89.3  PLT 230   Cardiac Enzymes:  Recent Labs Lab 02/16/17 1756  TROPONINI <0.03   BNP: Invalid input(s): POCBNP CBG: No results for input(s): GLUCAP in the last 168 hours. HbA1C: No results for input(s): HGBA1C in the last 72 hours. Urine analysis:    Component Value Date/Time   COLORURINE YELLOW 09/21/2016 0246   APPEARANCEUR CLEAR 09/21/2016 0246   LABSPEC 1.013 09/21/2016 0246   PHURINE 6.0 09/21/2016 0246   GLUCOSEU NEGATIVE 09/21/2016 0246   HGBUR MODERATE (A) 09/21/2016 0246   BILIRUBINUR NEGATIVE 09/21/2016 0246   KETONESUR 5 (A) 09/21/2016 0246   PROTEINUR 100 (A) 09/21/2016 0246   UROBILINOGEN 0.2 04/04/2015 1918   NITRITE NEGATIVE 09/21/2016 0246   LEUKOCYTESUR NEGATIVE 09/21/2016 0246   Sepsis Labs: @LABRCNTIP (procalcitonin:4,lacticidven:4) ) Recent Results (from the past 240 hour(s))  Culture, blood (routine x 2)     Status: None   Collection Time: 02/16/17  6:42 PM  Result Value Ref Range Status   Specimen Description BLOOD RIGHT FOREARM  Final   Special Requests   Final    BOTTLES DRAWN AEROBIC AND ANAEROBIC Blood Culture results may not be optimal due to an  inadequate volume of blood received in culture bottles   Culture NO GROWTH 5 DAYS  Final   Report Status 02/21/2017 FINAL  Final  Culture, blood (routine x 2)     Status: None   Collection Time: 02/16/17  6:44 PM  Result Value Ref Range Status   Specimen Description BLOOD RIGHT HAND  Final   Special Requests   Final    BOTTLES DRAWN AEROBIC AND ANAEROBIC Blood Culture results may not be optimal due to an inadequate volume of blood received in culture bottles   Culture NO GROWTH 5 DAYS  Final   Report Status 02/21/2017 FINAL  Final     Scheduled Meds: . amLODipine  5 mg Oral Daily  . arformoterol  15 mcg Nebulization BID  . atorvastatin  20 mg Oral Daily  . budesonide (PULMICORT) nebulizer solution  0.5 mg Nebulization BID  . citalopram  20 mg Oral Daily  . enoxaparin (LOVENOX) injection  30 mg Subcutaneous Q24H  . gabapentin  100 mg Oral Daily  . ipratropium-albuterol  3 mL Nebulization TID  . mouth rinse  15 mL Mouth Rinse BID  . methylPREDNISolone (SOLU-MEDROL)  injection  40 mg Intravenous Q8H  . nicotine  14 mg Transdermal Daily  . pantoprazole  40 mg Oral Daily   Continuous Infusions:  Procedures/Studies: Dg Chest 2 View  Result Date: 02/16/2017 CLINICAL DATA:  71 y/o  F; shortness of breath. EXAM: CHEST  2 VIEW COMPARISON:  09/20/2016 chest radiograph FINDINGS: Stable normal cardiac silhouette. Aortic atherosclerosis with calcification. Hyperinflated lungs with flattened diaphragms compatible with COPD. No focal consolidation. No pleural effusion or pneumothorax. Bones are unremarkable. IMPRESSION: No active cardiopulmonary disease. Electronically Signed   By: Kristine Garbe M.D.   On: 02/16/2017 18:32    Loyda Costin, DO  [facility] Pager [phone]  If 7PM-7AM, please contact night-coverage [email or web address] Password [email or web address] 02/21/2017, 8:16 AM   LOS: 5 days

## 2017-02-21 NOTE — Plan of Care (Signed)
Problem: Safety: Goal: Ability to remain free from injury will improve Outcome: Progressing Pt rings the call bell when needing to go to bathroom, bed alarm on. Pt educated on importance of safety and preventing falls. Pt verbalized understanding. Pt re-oriented to room and call bell within reach. Bed in lowest locked position. Will continue to monitor pt.

## 2017-02-21 NOTE — Progress Notes (Signed)
Dr. Marin Comment paged and made aware of 8/10 "indigestion/chest pain" as well as BP and EKG results. New order for Ativan 0.5mg  x1, Latetalol and Mylanta for indigestion. Will continue to monitor pt

## 2017-02-21 NOTE — Progress Notes (Signed)
Pt c/o burning sensation in center of chest, states its a lot of pressure that's burning. Pt got up to Bathroom and started feeling this way. Pts BP 201/74, RT called for an EKG to be done. Will call MD upon completion of EKG.

## 2017-02-22 DIAGNOSIS — J9601 Acute respiratory failure with hypoxia: Secondary | ICD-10-CM

## 2017-02-22 LAB — GLUCOSE, CAPILLARY
GLUCOSE-CAPILLARY: 119 mg/dL — AB (ref 65–99)
GLUCOSE-CAPILLARY: 132 mg/dL — AB (ref 65–99)
GLUCOSE-CAPILLARY: 138 mg/dL — AB (ref 65–99)
Glucose-Capillary: 126 mg/dL — ABNORMAL HIGH (ref 65–99)

## 2017-02-22 LAB — CBC
HCT: 38.6 % (ref 36.0–46.0)
HEMOGLOBIN: 12.9 g/dL (ref 12.0–15.0)
MCH: 29.9 pg (ref 26.0–34.0)
MCHC: 33.4 g/dL (ref 30.0–36.0)
MCV: 89.6 fL (ref 78.0–100.0)
Platelets: 266 10*3/uL (ref 150–400)
RBC: 4.31 MIL/uL (ref 3.87–5.11)
RDW: 13.3 % (ref 11.5–15.5)
WBC: 6.7 10*3/uL (ref 4.0–10.5)

## 2017-02-22 LAB — BASIC METABOLIC PANEL
ANION GAP: 8 (ref 5–15)
BUN: 20 mg/dL (ref 6–20)
CALCIUM: 9 mg/dL (ref 8.9–10.3)
CO2: 39 mmol/L — AB (ref 22–32)
Chloride: 82 mmol/L — ABNORMAL LOW (ref 101–111)
Creatinine, Ser: 0.59 mg/dL (ref 0.44–1.00)
GFR calc Af Amer: >60 mL/min (ref >60)
GFR calc non Af Amer: >60 mL/min (ref >60)
GLUCOSE: 131 mg/dL — AB (ref 65–99)
POTASSIUM: 3.7 mmol/L (ref 3.5–5.1)
Sodium: 129 mmol/L — ABNORMAL LOW (ref 135–145)

## 2017-02-22 MED ORDER — SODIUM CHLORIDE 0.9 % IV SOLN
INTRAVENOUS | Status: DC
  Administered 2017-02-22 – 2017-02-23 (×2): via INTRAVENOUS

## 2017-02-22 NOTE — Evaluation (Signed)
Physical Therapy Evaluation Patient Details Name: Lindsey Combs MRN: [id number] DOB: 03/27/1946 Today's Date: 02/22/2017   History of Present Illness  Lindsey Combs is an 71 y.o. female with hx of longstanding and active cigarette abuse, prior home oxygen, anxiety, severe COPD, HLD, HTN, prior hyponatremia on diuretics, presented to the ER with increase SOB, wheezing and progressive DOE.  Evaluation in the ER showed CXR with no infiltrate, with no leukocytosis, and normal renal fx.  She was found to have hypoxia even at rest, and supplement oxygen was given to her.  She was given nebs, but still has wheezing, and hospitalist was asked to admit her for COPD exacerbation.   Clinical Impression  Pt received lying in bed and was agreeable to PT evaluation. Pt from home and reports independence with all ADLs, IADLs, and walking without an AD. Currently, pt independent with bed mobility, min guard for amb with RW 100ft in room, and min A for transfers. During amb in room, pt c/o her R knee starting to buckle, however, no significant buckle was noticed, but she was unsteady on the way back to the chair. When pt went to sit in the chair, her R knee gave way and she plopped into the chair as the PT provided min A to control her descent into the chair (she likely would have been a max to total assist to maintain balance had the chair not been directly behind her). Pt's vitals at EOB were 95% O2 and 88bpm  While sitting in chair after amb 65ft in room with RW, pt's vitals were 87% O2 and 72 HR. PT provided verbal and visual cues on how to properly breathe, and following 3-4 mins of rest, her vitals improved to 93% O2. RW left in room to assist with pt's ambulation. Pt left in chair with alarm on, call bell and phone within reach. Nursing notified of performance with PT. Due to pt's current deficits in mobility and generalized weakness, recommending SNF and pt was agreeable. She stated that she would like to go to the  [facility] if she could get in.    Follow Up Recommendations SNF    Equipment Recommendations  None recommended by PT (pt has RW at home)    Recommendations for Other Services       Precautions / Restrictions Precautions Precautions: Fall Precaution Comments: pt denies any falls in the last 6 months but during session, pt c/o R knee buckling when amb to the bathroom earlier. When ambulating with RW with PT, pt c/o R knee buckling but no significant buckling was noted. However, when she went to sit in the chair, her R knee gave way and she plopped into the chair.      Mobility  Bed Mobility Overal bed mobility: Independent                Transfers Overall transfer level: Needs assistance Equipment used: Rolling walker (2 wheeled) Transfers: Sit to/from Stand Sit to Stand: Min assist         General transfer comment: supervision for sit > stand from EOB with RW, however, with stand > sit in chair, pt's R knee buckled and she plopped into the chair -- PT provided min A for controlled descent into the chair with the gait belt.  Ambulation/Gait Ambulation/Gait assistance: Min guard Ambulation Distance (Feet): 8 Feet Assistive device: Rolling walker (2 wheeled) Gait Pattern/deviations: Decreased stride length;Decreased weight shift to right;Decreased weight shift to left;Narrow base of support  Gait velocity interpretation: <1.8 ft/sec, indicative of risk for recurrent falls General Gait Details: pt gait slowed and unsteady -- she c/o her R knee was starting to buckle but PT did not notice any significant buckling during amb with RW in room  Stairs            Wheelchair Mobility    Modified Rankin (Stroke Patients Only)       Balance Overall balance assessment: Needs assistance Sitting-balance support: No upper extremity supported Sitting balance-Leahy Scale: Good     Standing balance support: Bilateral upper extremity supported Standing balance-Leahy  Scale: Fair                               Pertinent Vitals/Pain Pain Assessment: 0-10 Pain Location: states her breathing hurts Pain Intervention(s): Limited activity within patient's tolerance;Repositioned;Monitored during session    [place] expects to be discharged to:: Private residence Living Arrangements: Alone   Type of Home: Apartment Home Access: Level entry     Home Layout: One level Home Equipment: Walker - 2 wheels      Prior Function Level of Independence: Independent         Comments: Pt states that she could dress, bathe, and go grocery shopping independently. She states she walked in the [employer] walk last week without an AD.     Hand Dominance   Dominant Hand: Right    Extremity/Trunk Assessment   Upper Extremity Assessment Upper Extremity Assessment: Overall WFL for tasks assessed    Lower Extremity Assessment Lower Extremity Assessment: Generalized weakness    Cervical / Trunk Assessment Cervical / Trunk Assessment: Kyphotic  Communication   Communication: No difficulties  Cognition Arousal/Alertness: Awake/alert Behavior During Therapy: WFL for tasks assessed/performed Overall Cognitive Status: Within Functional Limits for tasks assessed                                        General Comments      Exercises     Assessment/Plan    PT Assessment Patient needs continued PT services  PT Problem List Decreased strength;Decreased activity tolerance;Decreased balance;Cardiopulmonary status limiting activity;Decreased mobility       PT Treatment Interventions DME instruction;Gait training;Stair training;Functional mobility training;Therapeutic activities;Therapeutic exercise;Balance training;Patient/family education    PT Goals (Current goals can be found in the Care Plan section)  Acute Rehab PT Goals Patient Stated Goal: to go home PT Goal Formulation: With patient Time For Goal  Achievement: 02/25/17 Potential to Achieve Goals: Fair    Frequency Min 3X/week   Barriers to discharge        Co-evaluation               AM-PAC PT "6 Clicks" Daily Activity  Outcome Measure Difficulty turning over in bed (including adjusting bedclothes, sheets and blankets)?: None Difficulty moving from lying on back to sitting on the side of the bed? : None Difficulty sitting down on and standing up from a chair with arms (e.g., wheelchair, bedside commode, etc,.)?: A Little Help needed moving to and from a bed to chair (including a wheelchair)?: A Lot Help needed walking in hospital room?: A Lot Help needed climbing 3-5 steps with a railing? : A Lot 6 Click Score: 17    End of Session Equipment Utilized During Treatment: Gait belt;Oxygen (3L) Activity Tolerance: Patient limited  by fatigue Patient left: in chair;with call bell/phone within reach;with chair alarm set Nurse Communication: Mobility status;Precautions PT Visit Diagnosis: Muscle weakness (generalized) (M62.81);Unsteadiness on feet (R26.81);Difficulty in walking, not elsewhere classified (R26.2);Other abnormalities of gait and mobility (R26.89)    Time: 9242-6834 PT Time Calculation (min) (ACUTE ONLY): 32 min   Charges:   PT Evaluation $PT Eval Low Complexity: 1 Procedure PT Treatments $Therapeutic Activity: 8-22 mins   PT G Codes:   PT G-Codes **NOT FOR INPATIENT CLASS** Functional Assessment Tool Used: AM-PAC 6 Clicks Basic Mobility;Clinical judgement Functional Limitation: Mobility: Walking and moving around Mobility: Walking and Moving Around Current Status (H9622): At least 60 percent but less than 80 percent impaired, limited or restricted      Geraldine Solar PT, DPT

## 2017-02-22 NOTE — Care Management Important Message (Signed)
Important Message  Patient Details  Name: Lindsey Combs MRN: [id number] Date of Birth: Dec 23, 1945   Medicare Important Message Given:  Yes    Shanterica Biehler, Chauncey Reading, RN 02/22/2017, 1:48 PM

## 2017-02-22 NOTE — Care Management Note (Signed)
Case Management Note  Patient Details  Name: CELENIA HRUSKA MRN: [id number] Date of Birth: [birth year]    Expected Discharge Date:       02/25/2017           Expected Discharge Plan:  Home/Self Care  In-House Referral:     Discharge planning Services  CM Consult  Post Acute Care Choice:    Choice offered to:     DME Arranged:    DME Agency:     HH Arranged:    HH Agency:     Status of Service:  In process, will continue to follow  If discussed at Long Length of Stay Meetings, dates discussed:    Additional Comments: CM discussed SNF recommendation with patient. Patient agreeable to SNF. CSW consulted.   Kristyanna Barcelo, Chauncey Reading, RN 02/22/2017, 1:49 PM

## 2017-02-22 NOTE — Progress Notes (Signed)
PROGRESS NOTE    WINNONA Combs  UTM:[id number] DOB: 13-Feb-1946 DOA: 02/16/2017 PCP: [name], MD     Brief Narrative:  71 y/o woman admitted to the hospital from home on 5/24 due to SOB and wheezing. Found to have COPD with acute exacerbation and admission was requested.   Assessment & Plan:   Principal Problem:   COPD with acute exacerbation ([place]) Active Problems:   Acute respiratory failure with hypoxia (HCC)   COPD exacerbation (HCC)   Hyponatremia   Hyperlipidemia   Hypertension   Tobacco abuse   Acute Hypoxemic Respiratory Failure -Will likely need oxygen on DC. -Due to COPD. See below for details.  COPD with Acute Exacerbation -No wheezing on exam today but has diminished BS. -Continue current steroid dose without titrating today. -Completed 5 days of levaquin -Continue nebs. -Will DC on spiriva and symbicort in addition to PRN albuterol.  Atypical CP -Likely due to COPD with acute exacerbation vs anxiety attack. -EKG without specific acute ischemic changes. -Negative troponins.  Hyponatremia -Na remains low. -Suspect due to HCTZ and Lexapro. -HCTZ has been discontinued. -Continue IVF and recheck in am.  Essential HTN -Fair control. -Continue norvasc. -Do not expect further medication changes while in the hospital.  Tobacco Abuse -Counseled on cessation. -Continue nicotine patch.  Hyperlipidemia -Continue statin  Depression/Anxiety Disorder -Continue celexa and home dose alprazolam.    DVT prophylaxis: lovenox Code Status: full code Family Communication: patient only Disposition Plan: anticipate DC SNF in 24-48 hours  Consultants:   None  Procedures:   None  Antimicrobials:  Anti-infectives    Start     Dose/Rate Route Frequency Ordered Stop   02/20/17 2200  levofloxacin (LEVAQUIN) tablet 750 mg     750 mg Oral  Once 02/20/17 1749 02/20/17 2030   02/18/17 2348  levofloxacin (LEVAQUIN) IVPB 750 mg  Status:  Discontinued     750 mg 100 mL/hr over 90 Minutes Intravenous Every 48 hours 02/17/17 0806 02/20/17 1749   02/16/17 2300  levofloxacin (LEVAQUIN) IVPB 750 mg  Status:  Discontinued     750 mg 100 mL/hr over 90 Minutes Intravenous Every 24 hours 02/16/17 2238 02/17/17 0806       Subjective: Feels like SOB has improved from yesterday. Is a little drowsy.  Objective: Vitals:   02/22/17 0726 02/22/17 1104 02/22/17 1402 02/22/17 1456  BP:    (!) 166/67  Pulse:  88  96  Resp:    (!) 22  Temp:    98.4 F (36.9 C)  TempSrc:    Oral  SpO2: 100% 95% 99% 95%  Weight:      Height:        Intake/Output Summary (Last 24 hours) at 02/22/17 1652 Last data filed at 02/22/17 1100  Gross per 24 hour  Intake              480 ml  Output             1200 ml  Net             -720 ml   Filed Weights   02/16/17 1750 02/16/17 2239  Weight: 45.4 kg (100 lb) 44.8 kg (98 lb 12.8 oz)    Examination:  General exam: Drowsy, able to answer simple questions but falls back to sleep frequently doing out conversation. Respiratory system: Poor air movement, no wheezes, rales or ronchi Cardiovascular system:RRR. No murmurs, rubs, gallops. Gastrointestinal system: Abdomen is nondistended, soft and nontender. No organomegaly or masses  felt. Normal bowel sounds heard. Central nervous system: Moves all 4 spontaneously. Extremities: No C/C/E, +pedal pulses Skin: No rashes, lesions or ulcers Psychiatry: Unable to fully assess given current mental state.    Data Reviewed: I have personally reviewed following labs and imaging studies  CBC:  Recent Labs Lab 02/16/17 1756 02/22/17 0656  WBC 8.2 6.7  NEUTROABS 6.4  --   HGB 13.6 12.9  HCT 40.0 38.6  MCV 89.3 89.6  PLT 230 932   Basic Metabolic Panel:  Recent Labs Lab 02/16/17 1756 02/17/17 0558 02/18/17 0715 02/19/17 0611 02/22/17 0656  NA 129* 129* 135 134* 129*  K 3.4* 5.1 5.3* 4.4 3.7  CL 92* 94* 98* 95* 82*  CO2 29 28 30  35* 39*  GLUCOSE 150* 172*  138* 109* 131*  BUN 10 11 10 14 20   CREATININE 0.60 0.62 0.50 0.50 0.59  CALCIUM 9.2 9.2 9.7 9.3 9.0  MG  --   --  2.2 2.0  --    GFR: Estimated Creatinine Clearance: 45 mL/min (by C-G formula based on SCr of 0.59 mg/dL). Liver Function Tests:  Recent Labs Lab 02/16/17 1756  AST 28  ALT 23  ALKPHOS 66  BILITOT 0.4  PROT 7.4  ALBUMIN 4.4   No results for input(s): LIPASE, AMYLASE in the last 168 hours. No results for input(s): AMMONIA in the last 168 hours. Coagulation Profile: No results for input(s): INR, PROTIME in the last 168 hours. Cardiac Enzymes:  Recent Labs Lab 02/16/17 1756 02/21/17 0832 02/21/17 1443 02/21/17 2022  TROPONINI <0.03 <0.03 <0.03 <0.03   BNP (last 3 results) No results for input(s): PROBNP in the last 8760 hours. HbA1C: No results for input(s): HGBA1C in the last 72 hours. CBG:  Recent Labs Lab 02/22/17 0734 02/22/17 1119 02/22/17 1641  GLUCAP 119* 132* 126*   Lipid Profile: No results for input(s): CHOL, HDL, LDLCALC, TRIG, CHOLHDL, LDLDIRECT in the last 72 hours. Thyroid Function Tests: No results for input(s): TSH, T4TOTAL, FREET4, T3FREE, THYROIDAB in the last 72 hours. Anemia Panel: No results for input(s): VITAMINB12, FOLATE, FERRITIN, TIBC, IRON, RETICCTPCT in the last 72 hours. Urine analysis:    Component Value Date/Time   COLORURINE YELLOW 09/21/2016 0246   APPEARANCEUR CLEAR 09/21/2016 0246   LABSPEC 1.013 09/21/2016 0246   PHURINE 6.0 09/21/2016 0246   GLUCOSEU NEGATIVE 09/21/2016 0246   HGBUR MODERATE (A) 09/21/2016 0246   BILIRUBINUR NEGATIVE 09/21/2016 0246   KETONESUR 5 (A) 09/21/2016 0246   PROTEINUR 100 (A) 09/21/2016 0246   UROBILINOGEN 0.2 04/04/2015 1918   NITRITE NEGATIVE 09/21/2016 0246   LEUKOCYTESUR NEGATIVE 09/21/2016 0246   Sepsis Labs: @LABRCNTIP (procalcitonin:4,lacticidven:4)  ) Recent Results (from the past 240 hour(s))  Culture, blood (routine x 2)     Status: None   Collection Time:  02/16/17  6:42 PM  Result Value Ref Range Status   Specimen Description BLOOD RIGHT FOREARM  Final   Special Requests   Final    BOTTLES DRAWN AEROBIC AND ANAEROBIC Blood Culture results may not be optimal due to an inadequate volume of blood received in culture bottles   Culture NO GROWTH 5 DAYS  Final   Report Status 02/21/2017 FINAL  Final  Culture, blood (routine x 2)     Status: None   Collection Time: 02/16/17  6:44 PM  Result Value Ref Range Status   Specimen Description BLOOD RIGHT HAND  Final   Special Requests   Final    BOTTLES DRAWN AEROBIC AND ANAEROBIC  Blood Culture results may not be optimal due to an inadequate volume of blood received in culture bottles   Culture NO GROWTH 5 DAYS  Final   Report Status 02/21/2017 FINAL  Final         Radiology Studies: Dg Chest 2 View  Result Date: 02/21/2017 CLINICAL DATA:  Chest pain, symptoms of COPD exacerbation. EXAM: CHEST  2 VIEW COMPARISON:  Chest x-ray of Feb 16, 2017 FINDINGS: The lungs remain hyperinflated with hemidiaphragm flattening. There is no focal infiltrate. There is no pleural effusion. The heart and pulmonary vascularity are normal. There is calcification in the wall of the aortic arch. The mediastinum is normal in width. The bony thorax is unremarkable. IMPRESSION: Marked hyperinflation consistent with COPD. No pneumonia, CHF, nor other acute cardiopulmonary abnormality. Thoracic aortic atherosclerosis. Electronically Signed   By: David  Martinique M.D.   On: 02/21/2017 09:13        Scheduled Meds: . amLODipine  5 mg Oral Daily  . arformoterol  15 mcg Nebulization BID  . atorvastatin  20 mg Oral Daily  . budesonide (PULMICORT) nebulizer solution  0.5 mg Nebulization BID  . citalopram  20 mg Oral Daily  . enoxaparin (LOVENOX) injection  30 mg Subcutaneous Q24H  . gabapentin  100 mg Oral Daily  . ipratropium-albuterol  3 mL Nebulization TID  . mouth rinse  15 mL Mouth Rinse BID  . methylPREDNISolone  (SOLU-MEDROL) injection  40 mg Intravenous Q8H  . nicotine  14 mg Transdermal Daily  . pantoprazole  40 mg Oral Daily   Continuous Infusions:   LOS: 6 days    Time spent: 35 minutes. Greater than 50% of this time was spent in direct contact with the patient coordinating care.     Lelon Frohlich, MD [facility] Pager [phone]  If 7PM-7AM, please contact night-coverage [email or web address] Password [email or web address] 02/22/2017, 4:52 PM

## 2017-02-23 LAB — GLUCOSE, CAPILLARY
GLUCOSE-CAPILLARY: 113 mg/dL — AB (ref 65–99)
GLUCOSE-CAPILLARY: 117 mg/dL — AB (ref 65–99)
Glucose-Capillary: 112 mg/dL — ABNORMAL HIGH (ref 65–99)

## 2017-02-23 LAB — BASIC METABOLIC PANEL
Anion gap: 8 (ref 5–15)
BUN: 15 mg/dL (ref 6–20)
CHLORIDE: 86 mmol/L — AB (ref 101–111)
CO2: 38 mmol/L — ABNORMAL HIGH (ref 22–32)
Calcium: 9.1 mg/dL (ref 8.9–10.3)
Creatinine, Ser: 0.46 mg/dL (ref 0.44–1.00)
GFR calc Af Amer: >60 mL/min (ref >60)
GLUCOSE: 111 mg/dL — AB (ref 65–99)
POTASSIUM: 3.6 mmol/L (ref 3.5–5.1)
Sodium: 132 mmol/L — ABNORMAL LOW (ref 135–145)

## 2017-02-23 MED ORDER — TIZANIDINE HCL 2 MG PO TABS: 2.0000 mg | ORAL_TABLET | Freq: Two times a day (BID) | ORAL | 0 refills | Status: DC | PRN

## 2017-02-23 MED ORDER — PREDNISONE 10 MG PO TABS: 10.0000 mg | ORAL_TABLET | Freq: Every day | ORAL | 0 refills | Status: DC

## 2017-02-23 MED ORDER — ALPRAZOLAM 0.5 MG PO TABS: 0.5000 mg | ORAL_TABLET | Freq: Two times a day (BID) | ORAL | 0 refills | Status: DC | PRN

## 2017-02-23 NOTE — Care Management Note (Signed)
Case Management Note  Patient Details  Name: Lindsey Combs MRN: [id number] Date of Birth: 05/02/1946  Expected Discharge Date:  02/23/17               Expected Discharge Plan:  [place]  In-House Referral:  Clinical Social Work  Discharge planning Services  CM Consult  Status of Service:  Completed, signed off  Additional Comments: Pt has been recommended for SNF. CSW aware and making arrangements for placement. No CM needs a this time, will DC today.   Sherald Barge, RN 02/23/2017, 11:32 AM

## 2017-02-23 NOTE — NC FL2 (Signed)
[place] LEVEL OF CARE SCREENING TOOL     IDENTIFICATION  Patient Name: RAFEEF Combs Birthdate: 09/09/1946 Sex: female Admission Date (Current Location): 02/16/2017  [place] and [place] Number:  Lindsey Combs [id number] [place] and Address:  [facility] [address] [place]      Provider Number: [id number]  Attending Physician Name and Address:  Lindsey Combs, Lindsey Combs*  Relative Name and Phone Number:       Current Level of Care: Hospital Recommended Level of Care: Lindsey Combs Prior Approval Number:    Date Approved/Denied:   PASRR Number: [id number]  Discharge Plan: SNF    Current Diagnoses: Patient Active Problem List   Diagnosis Date Noted  . COPD with acute exacerbation ([place]) 02/16/2017  . Tobacco abuse 02/16/2017  . Malnutrition of moderate degree 09/22/2016  . Hypomagnesemia 09/21/2016  . Tobacco use 08/03/2016  . Hyperlipidemia 08/03/2016  . Hypertension 08/03/2016  . History of Helicobacter pylori infection 08/03/2016  . History of anemia 08/03/2016  . Diverticulosis 08/03/2016  . Arthritis 08/03/2016  . 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 Chronic obstructive pulmonary disease with acute exacerbation ([place])   . Acute respiratory failure with hypoxia ([place]) 04/03/2015  . COPD exacerbation ([place]) 04/03/2015  . Hyponatremia 04/03/2015  . Dehydration 04/03/2015  . Hyperglycemia 04/03/2015  . Anxiety   . COPD (chronic obstructive pulmonary disease) ([place])   . Peripheral neuropathy ([place])   . Tobacco dependence   . Back pain, chronic 04/19/2013  . Abnormal weight loss 02/19/2013  . Anemia, iron deficiency 02/19/2013  . GERD (gastroesophageal reflux disease) 02/19/2013    Orientation RESPIRATION BLADDER Height & Weight     Self, Time, Situation, Place  O2 (3L) Continent Weight: 98 lb 12.8 oz (44.8 kg) Height:  5\' 5"  (165.1 cm)  BEHAVIORAL SYMPTOMS/MOOD NEUROLOGICAL BOWEL NUTRITION STATUS    Continent Diet (Heart Healthy)  AMBULATORY STATUS COMMUNICATION OF NEEDS Skin   Limited Assist Verbally Normal                       Personal Care Assistance Level of Assistance  Bathing, Feeding, Dressing Bathing Assistance: Limited assistance Feeding assistance: Independent Dressing Assistance: Limited assistance     Functional Limitations Info  Sight, Hearing, Speech Sight Info: Adequate Hearing Info: Adequate Speech Info: Adequate    SPECIAL CARE FACTORS FREQUENCY  PT (By licensed PT)     PT Frequency: 5x/week              Contractures Contractures Info: Not present    Additional Factors Info  Allergies, Code Status, Psychotropic Code Status Info: Full Allergies Info: Chantix, Codeine Psychotropic Info: Xanax, Lexapro         Current Medications (02/23/2017):  This is the current hospital active medication list Current Facility-Administered Medications  Medication Dose Route Frequency Provider Last Rate Last Dose  . 0.9 %  sodium chloride infusion   Intravenous Continuous Lindsey Combs, Lindsey Combs 75 mL/hr at 02/23/17 0645    . acetaminophen (TYLENOL) tablet 500 mg  500 mg Oral Q6H PRN Lindsey Falconer, Combs   500 mg at 02/22/17 2208  . ALPRAZolam Duanne Moron) tablet 0.5 mg  0.5 mg Oral BID PRN Lindsey Falconer, Combs   0.5 mg at 02/22/17 1452  . alum & mag hydroxide-simeth (MAALOX/MYLANTA) 200-200-20 MG/5ML suspension 30 mL  30 mL Oral Q4H PRN Lindsey Falconer, Combs   30 mL at 02/21/17 0122  . amLODipine (NORVASC) tablet 5 mg  5 mg Oral Daily Lindsey Falconer, Combs   5 mg at 02/23/17 1036  . arformoterol (BROVANA) nebulizer solution 15 mcg  15 mcg Nebulization BID Lindsey Combs   15 mcg at 02/23/17 0749  . atorvastatin (LIPITOR) tablet 20 mg  20 mg Oral Daily Lindsey Falconer, Combs   20 mg at 02/23/17 1036  . budesonide (PULMICORT) nebulizer solution 0.5 mg  0.5 mg Nebulization BID Tat, David, Combs   0.5 mg at 02/23/17 0746  . citalopram (CELEXA) tablet 20 mg  20 mg Oral Daily Lindsey Falconer, Combs   20 mg at  02/23/17 1036  . enoxaparin (LOVENOX) injection 30 mg  30 mg Subcutaneous Q24H Tat, Lindsey Brow, Combs   30 mg at 02/22/17 2206  . gabapentin (NEURONTIN) capsule 100 mg  100 mg Oral Daily Lindsey Falconer, Combs   100 mg at 02/23/17 1036  . hydrALAZINE (APRESOLINE) injection 10 mg  10 mg Intravenous Q6H PRN Lindsey Combs   10 mg at 02/21/17 2042  . ipratropium-albuterol (DUONEB) 0.5-2.5 (3) MG/3ML nebulizer solution 3 mL  3 mL Nebulization Q2H PRN Tat, Lindsey Brow, Combs   3 mL at 02/23/17 0407  . ipratropium-albuterol (DUONEB) 0.5-2.5 (3) MG/3ML nebulizer solution 3 mL  3 mL Nebulization TID Lindsey Combs   3 mL at 02/23/17 0743  . labetalol (NORMODYNE,TRANDATE) injection 10 mg  10 mg Intravenous Q2H PRN Lindsey Falconer, Combs   10 mg at 02/21/17 0123  . MEDLINE mouth rinse  15 mL Mouth Rinse BID Lindsey Falconer, Combs   15 mL at 02/22/17 2206  . methylPREDNISolone sodium succinate (SOLU-MEDROL) 40 mg/mL injection 40 mg  40 mg Intravenous Q8H Tat, Lindsey Brow, Combs   40 mg at 02/23/17 1037  . nicotine (NICODERM CQ - dosed in mg/24 hours) patch 14 mg  14 mg Transdermal Daily Lindsey Patience, Combs   14 mg at 02/23/17 1038  . ondansetron (ZOFRAN) injection 4 mg  4 mg Intravenous Q6H PRN Lindsey Patience, Combs   4 mg at 02/19/17 2137  . pantoprazole (PROTONIX) EC tablet 40 mg  40 mg Oral Daily Lindsey Falconer, Combs   40 mg at 02/23/17 1036  . tiZANidine (ZANAFLEX) tablet 2 mg  2 mg Oral BID PRN Lindsey Falconer, Combs   2 mg at 02/19/17 2137     Discharge Medications: Please see discharge summary for a list of discharge medications.  Relevant Imaging Results:  Relevant Lab Results:   Additional Information SSN [id number] Lindsey Pugh, LCSW

## 2017-02-23 NOTE — Discharge Summary (Signed)
Physician Discharge Summary  Lindsey Combs OIZ:[id number] DOB: 11-Dec-1945 DOA: 02/16/2017  PCP: Lindsey Gravel, MD  Admit date: 02/16/2017 Discharge date: 02/23/2017  Time spent: 45 minutes  Recommendations for Outpatient Follow-up:  -Will be discharged to SNF today.   Discharge Diagnoses:  Principal Problem:   COPD with acute exacerbation ([place]) Active Problems:   Acute respiratory failure with hypoxia (HCC)   COPD exacerbation (HCC)   Hyponatremia   Hyperlipidemia   Hypertension   Tobacco abuse   Discharge Condition: Stable and improved  Filed Weights   02/16/17 1750 02/16/17 2239  Weight: 45.4 kg (100 lb) 44.8 kg (98 lb 12.8 oz)    History of present illness:  As per Dr. Marin Comment on 5/24: Lindsey Combs is an 71 y.o. female with hx of longstanding and active cigarette abuse, prior home oxygen, anxiety, severe COPD, HLD, HTN, prior hyponatremia on diuretics, presented to the ER with increase SOB, wheezing and progressive DOE.  Evaluation in the ER showed CXR with no infiltrate, with no leukocytosis, and normal renal fx.  She was found to have hypoxia even at rest, and supplement oxygen was given to her.  She was given nebs, but still has wheezing, and hospitalist was asked to admit her for COPD exacerbation.   Hospital Course:   Acute Hypoxemic Respiratory Failure -Will need oxygen on DC. -Due to COPD. See below for details.  COPD with Acute Exacerbation -No wheezing on exam today but has diminished BS. -Discharge on a prednisone taper. -Completed 5 days of levaquin -Continue nebs. -Will DC on spiriva and symbicort in addition to PRN albuterol.  Atypical CP -Likely due to COPD with acute exacerbation vs anxiety attack. -EKG without specific acute ischemic changes. -Negative troponins.  Hyponatremia -Na is improved to 132 on DC. -Suspect due to HCTZ and Lexapro. -HCTZ has been discontinued.  Essential HTN -Fair control. -Continue norvasc. -Do not expect  further medication changes while in the hospital.  Tobacco Abuse -Counseled on cessation. -Continue nicotine patch.  Hyperlipidemia -Continue statin  Depression/Anxiety Disorder -Continue celexa and home dose alprazolam.    Procedures:  None   Consultations:  None  Discharge Instructions  Discharge Instructions    Diet - low sodium heart healthy    Complete by:  As directed    Increase activity slowly    Complete by:  As directed      Allergies as of 02/23/2017      Reactions   Chantix [varenicline] Other (See Comments)   Mental status changes   Codeine Itching      Medication List    STOP taking these medications   hydrochlorothiazide 12.5 MG tablet Commonly known as:  HYDRODIURIL   ranitidine 150 MG tablet Commonly known as:  ZANTAC     TAKE these medications   acetaminophen 500 MG tablet Commonly known as:  TYLENOL Take 500 mg by mouth every 6 (six) hours as needed for mild pain.   albuterol 108 (90 Base) MCG/ACT inhaler Commonly known as:  PROAIR HFA Inhale 2 puffs into the lungs every 4 (four) hours as needed for wheezing or shortness of breath.   ALPRAZolam 0.5 MG tablet Commonly known as:  XANAX Take 1 tablet (0.5 mg total) by mouth 2 (two) times daily as needed for anxiety. What changed:  how much to take  how to take this   amLODipine 5 MG tablet Commonly known as:  NORVASC TAKE 1 TABLET BY MOUTH ONCE A DAY.   atorvastatin 20 MG  tablet Commonly known as:  LIPITOR Take 20 mg by mouth daily.   escitalopram 5 MG tablet Commonly known as:  LEXAPRO Take 1 tablet by mouth daily.   gabapentin 100 MG capsule Commonly known as:  NEURONTIN Take 1 capsule by mouth daily.   NEXIUM PO Take 1 capsule by mouth daily.   omeprazole 20 MG capsule Commonly known as:  PRILOSEC Take 1 capsule by mouth daily.   predniSONE 10 MG tablet Commonly known as:  DELTASONE Take 1 tablet (10 mg total) by mouth daily with breakfast. Take 6 tablets  today and then decrease by 1 tablet daily until none are left.   SYMBICORT 160-4.5 MCG/ACT inhaler Generic drug:  budesonide-formoterol Inhale 2 puffs into the lungs 2 (two) times daily.   tiotropium 18 MCG inhalation capsule Commonly known as:  SPIRIVA Place 18 mcg into inhaler and inhale daily as needed. Shortness of breath   tiZANidine 2 MG tablet Commonly known as:  ZANAFLEX Take 1 tablet (2 mg total) by mouth 2 (two) times daily as needed for muscle spasms.            Durable Medical Equipment        Start     Ordered   02/21/17 [phone]  For home use only DME oxygen  Once    Question Answer Comment  Mode or (Route) Nasal cannula   Liters per Minute 3   Frequency Continuous (stationary and portable oxygen unit needed)   Oxygen conserving device Yes   Oxygen delivery system Gas      02/21/17 0657     Allergies  Allergen Reactions  . Chantix [Varenicline] Other (See Comments)    Mental status changes  . Codeine Itching   Follow-up Information    Lindsey Gravel, MD. Schedule an appointment as soon as possible for a visit in 2 week(s).   Specialty:  Internal Medicine Contact information: [address] [place] [address] [phone]            The results of significant diagnostics from this hospitalization (including imaging, microbiology, ancillary and laboratory) are listed below for reference.    Significant Diagnostic Studies: Dg Chest 2 View  Result Date: 02/21/2017 CLINICAL DATA:  Chest pain, symptoms of COPD exacerbation. EXAM: CHEST  2 VIEW COMPARISON:  Chest x-ray of Feb 16, 2017 FINDINGS: The lungs remain hyperinflated with hemidiaphragm flattening. There is no focal infiltrate. There is no pleural effusion. The heart and pulmonary vascularity are normal. There is calcification in the wall of the aortic arch. The mediastinum is normal in width. The bony thorax is unremarkable. IMPRESSION: Marked hyperinflation consistent with COPD.  No pneumonia, CHF, nor other acute cardiopulmonary abnormality. Thoracic aortic atherosclerosis. Electronically Signed   By: David  Martinique M.D.   On: 02/21/2017 09:13   Dg Chest 2 View  Result Date: 02/16/2017 CLINICAL DATA:  71 y/o  F; shortness of breath. EXAM: CHEST  2 VIEW COMPARISON:  09/20/2016 chest radiograph FINDINGS: Stable normal cardiac silhouette. Aortic atherosclerosis with calcification. Hyperinflated lungs with flattened diaphragms compatible with COPD. No focal consolidation. No pleural effusion or pneumothorax. Bones are unremarkable. IMPRESSION: No active cardiopulmonary disease. Electronically Signed   By: Kristine Garbe M.D.   On: 02/16/2017 18:32    Microbiology: Recent Results (from the past 240 hour(s))  Culture, blood (routine x 2)     Status: None   Collection Time: 02/16/17  6:42 PM  Result Value Ref Range Status   Specimen Description BLOOD  RIGHT FOREARM  Final   Special Requests   Final    BOTTLES DRAWN AEROBIC AND ANAEROBIC Blood Culture results may not be optimal due to an inadequate volume of blood received in culture bottles   Culture NO GROWTH 5 DAYS  Final   Report Status 02/21/2017 FINAL  Final  Culture, blood (routine x 2)     Status: None   Collection Time: 02/16/17  6:44 PM  Result Value Ref Range Status   Specimen Description BLOOD RIGHT HAND  Final   Special Requests   Final    BOTTLES DRAWN AEROBIC AND ANAEROBIC Blood Culture results may not be optimal due to an inadequate volume of blood received in culture bottles   Culture NO GROWTH 5 DAYS  Final   Report Status 02/21/2017 FINAL  Final     Labs: Basic Metabolic Panel:  Recent Labs Lab 02/17/17 0558 02/18/17 0715 02/19/17 0611 02/22/17 0656 02/23/17 0544  NA 129* 135 134* 129* 132*  K 5.1 5.3* 4.4 3.7 3.6  CL 94* 98* 95* 82* 86*  CO2 28 30 35* 39* 38*  GLUCOSE 172* 138* 109* 131* 111*  BUN 11 10 14 20 15   CREATININE 0.62 0.50 0.50 0.59 0.46  CALCIUM 9.2 9.7 9.3 9.0 9.1    MG  --  2.2 2.0  --   --    Liver Function Tests:  Recent Labs Lab 02/16/17 1756  AST 28  ALT 23  ALKPHOS 66  BILITOT 0.4  PROT 7.4  ALBUMIN 4.4   No results for input(s): LIPASE, AMYLASE in the last 168 hours. No results for input(s): AMMONIA in the last 168 hours. CBC:  Recent Labs Lab 02/16/17 1756 02/22/17 0656  WBC 8.2 6.7  NEUTROABS 6.4  --   HGB 13.6 12.9  HCT 40.0 38.6  MCV 89.3 89.6  PLT 230 266   Cardiac Enzymes:  Recent Labs Lab 02/16/17 1756 02/21/17 0832 02/21/17 1443 02/21/17 2022  TROPONINI <0.03 <0.03 <0.03 <0.03   BNP: BNP (last 3 results) No results for input(s): BNP in the last 8760 hours.  ProBNP (last 3 results) No results for input(s): PROBNP in the last 8760 hours.  CBG:  Recent Labs Lab 02/22/17 0734 02/22/17 1119 02/22/17 1641 02/22/17 2058 02/23/17 0734  GLUCAP 119* 132* 126* 138* 112*       Signed:  HERNANDEZ ACOSTA,ESTELA  [facility] Pager: [phone] 02/23/2017, 11:24 AM

## 2017-02-23 NOTE — Clinical Social Work Placement (Signed)
   CLINICAL SOCIAL WORK PLACEMENT  NOTE  Date:  02/23/2017  Patient Details  Name: Lindsey Combs MRN: [id number] Date of Birth: Oct 30, 1945  Clinical Social Work is seeking post-discharge placement for this patient at the [place] level of care (*CSW will initial, date and re-position this form in  chart as items are completed):  Yes   Patient/family provided with [facility] Work Department's list of facilities offering this level of care within the geographic area requested by the patient (or if unable, by the patient's family).  Yes   Patient/family informed of their freedom to choose among providers that offer the needed level of care, that participate in Medicare, Medicaid or managed care program needed by the patient, have an available bed and are willing to accept the patient.  Yes   Patient/family informed of 's ownership interest in [facility] and [facility], as well as of the fact that they are under no obligation to receive care at these facilities.  PASRR submitted to EDS on 02/23/17     PASRR number received on 02/23/17     Existing PASRR number confirmed on       FL2 transmitted to all facilities in geographic area requested by pt/family on 02/23/17     FL2 transmitted to all facilities within larger geographic area on       Patient informed that his/her managed care company has contracts with or will negotiate with certain facilities, including the following:            Patient/family informed of bed offers received.  Patient chooses bed at       Physician recommends and patient chooses bed at      Patient to be transferred to   on  .  Patient to be transferred to facility by       Patient family notified on   of transfer.  Name of family member notified:        PHYSICIAN       Additional Comment:    _______________________________________________ Ihor Gully, LCSW 02/23/2017, 11:45 AM

## 2017-02-23 NOTE — Progress Notes (Signed)
Patient discharged to [facility], report called,and given to [employer].  IV discontinued, catheter intact. [facility] to transport patient to facility.

## 2017-02-23 NOTE — Clinical Social Work Note (Signed)
Clinical Social Work Assessment  Patient Details  Name: Lindsey Combs MRN: [id number] Date of Birth: Dec 13, 1945  Date of referral:  02/22/17               Reason for consult:  Discharge Planning                Permission sought to share information with:    Permission granted to share information::     Name::        Agency::     Relationship::     Contact Information:     Housing/Transportation Living arrangements for the past 2 months:  Apartment Source of Information:  Patient Patient Interpreter Needed:  None Criminal Activity/Legal Involvement Pertinent to Current Situation/Hospitalization:  No - Comment as needed Significant Relationships:  Other(Comment) Lives with:  Self Do you feel safe going back to the place where you live?  Yes Need for family participation in patient care:  Yes (Comment)  Care giving concerns: None identified at baseline.    Social Worker assessment / plan:  Patient lives alone, uses a rolling walker, completes ADLs unassisted and drives. Patient is agreeable to SNF. Patient would like to go to [facility], [facility] or [facility].   Employment status:  Retired Nurse, adult, Medicaid In [place] PT Recommendations:  [place] / Referral to community resources:  [place]  Patient/Family's Response to care:  Patient is agreeable to SNF.   Patient/Family's Understanding of and Emotional Response to Diagnosis, Current Treatment, and Prognosis:  Patient understands her diagnosis, treatment and prognosis.   Emotional Assessment Appearance:  Appears stated age Attitude/Demeanor/Rapport:   (Cooperative) Affect (typically observed):  Accepting Orientation:  Oriented to Self, Oriented to Place, Oriented to  Time, Oriented to Situation Alcohol / Substance use:  Not Applicable Psych involvement (Current and /or in the community):  No (Comment)  Discharge Needs  Concerns to be addressed:   Discharge Planning Concerns Readmission within the last 30 days:  No Current discharge risk:  Chronically ill Barriers to Discharge:  No Barriers Identified   Ihor Gully, LCSW 02/23/2017, 11:43 AM

## 2017-02-23 NOTE — Clinical Social Work Placement (Signed)
   CLINICAL SOCIAL WORK PLACEMENT  NOTE  Date:  02/23/2017  Patient Details  Name: Lindsey Combs MRN: [id number] Date of Birth: [birth year]  Clinical Social Work is seeking post-discharge placement for this patient at the [place] level of care (*CSW will initial, date and re-position this form in  chart as items are completed):  Yes   Patient/family provided with [facility] Work Department's list of facilities offering this level of care within the geographic area requested by the patient (or if unable, by the patient's family).  Yes   Patient/family informed of their freedom to choose among providers that offer the needed level of care, that participate in Medicare, Medicaid or managed care program needed by the patient, have an available bed and are willing to accept the patient.  Yes   Patient/family informed of [facility]'s ownership interest in [facility] and [facility], as well as of the fact that they are under no obligation to receive care at these facilities.  PASRR submitted to EDS on 02/23/17     PASRR number received on 02/23/17     Existing PASRR number confirmed on       FL2 transmitted to all facilities in geographic area requested by pt/family on 02/23/17     FL2 transmitted to all facilities within larger geographic area on       Patient informed that his/her managed care company has contracts with or will negotiate with certain facilities, including the following:            Patient/family informed of bed offers received.   02/23/2017   Patient chooses bed at     [facility]  Physician recommends and patient chooses bed at      [facility] Patient to be transferred to   on  .  02/23/2017   Patient to be transferred to facility by      02/23/2017   Patient family notified on   of transfer.   Patient making family aware  Name of family member notified:        PHYSICIAN       Additional  Comment:    _______________________________________________ Lilly Cove, LCSW 02/23/2017, 3:25 PM

## 2017-02-23 NOTE — Care Management Note (Signed)
Case Management Note  Patient Details  Name: Lindsey Combs MRN: [id number] Date of Birth: 04-Mar-1946  If discussed at Long Length of Stay Meetings, dates discussed:  02/23/2017    Sherald Barge, RN 02/23/2017, 10:21 AM

## 2017-02-23 NOTE — Progress Notes (Signed)
LCSW following for disposition of needs.   Patient medically stable for discharge. Patient accepted bed at [facility] and facility is aware of admission.  EMS has been called per RN and report to be called. No other needs. All information sent through the [facility] for Epic.  Lane Hacker, MSW Clinical Social Work: [employer] Coverage for :  [phone]

## 2017-05-10 ENCOUNTER — Ambulatory Visit (HOSPITAL_COMMUNITY): Payer: Medicare Other

## 2017-06-14 ENCOUNTER — Encounter (HOSPITAL_COMMUNITY): Payer: Medicare Other | Attending: Oncology | Admitting: Oncology

## 2017-06-14 ENCOUNTER — Encounter (HOSPITAL_COMMUNITY): Payer: Self-pay

## 2017-06-14 VITALS — BP 170/56 | HR 80 | Temp 98.2°F | Resp 18 | Wt 96.5 lb

## 2017-06-14 DIAGNOSIS — Z720 Tobacco use: Secondary | ICD-10-CM

## 2017-06-14 DIAGNOSIS — C052 Malignant neoplasm of uvula: Secondary | ICD-10-CM

## 2017-06-14 NOTE — Progress Notes (Signed)
[facility]  Progress Note  Patient Care Team: [name], MD as PCP - General (Internal Medicine)  CHIEF COMPLAINTS/PURPOSE OF CONSULTATION:  Squamous cell carcinoma of the uvula, suspicious for invasion Tobacco Abuse  HISTORY OF PRESENTING ILLNESS:  Lindsey Combs 71 y.o. female is here because of squamous cell carcinoma of the uvula, suspicious for invasion.  Lindsey Combs is a pleasant 71 y.o. who presented with severe sore throat which persisted through antibiotics. She underwent a laryngoscopy on 06/08/2016 with Dr. Benjamine Mola showing severe posterior laryngeal edema. 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 She then underwent a direct laryngoscopy and biopsy of her uvula on 06/08/2016 with Dr. Benjamine Mola. Pathology of the uvula revealed squamous cell carcinoma suspicious for invasion.  Patient presents today for continued follow-up. Since her last visit she was hospitalized from 02/16/2017 through 02/23/2017 for COPD exacerbation. Since then she's been in rehabilitation which she was recently discharged from. Patient states that she continues to have congestion in her chest and has been coughing up white phlegm. She denies any associated fevers or chills. She denies any chest pain. She has not seen Dr. Benjamine Mola in the past year and is overdue for follow-up visit. She continues to smoke daily and states she is not smoking much but patient is very malodorous today from her cigarette smoke.    MEDICAL HISTORY:  Past Medical History:  Diagnosis Date  . Acute respiratory failure with hypoxia ([place])   . Anxiety   . Cancer (HCC)    uvular per pt  . COPD (chronic obstructive pulmonary disease) ([place])   . DDD (degenerative disc disease), lumbar   . Degenerative joint disease of spine   . Diverticulosis   . GERD (gastroesophageal reflux disease)   . H. pylori infection    treated 02/2013.  Lindsey Combs  History of UTI   . Hypercholesterolemia   . Hypertension   . Hyponatremia   . Peripheral neuropathy   . Shortness of breath    with exertion  . Tobacco abuse     SURGICAL HISTORY: Past Surgical History:  Procedure Laterality Date  . ABDOMINAL EXPLORATION SURGERY     age 47, large ovarian cyst  . BIOPSY N/A 03/01/2013   XKG:YJEH hiatal hernia; otherwise, normal examination/ Status post biopsies as described above. SB bx negative for Celiac. +h/pylori  . CATARACT EXTRACTION W/ INTRAOCULAR LENS  IMPLANT, BILATERAL    . COLONOSCOPY  10/16/09   Jenkins:3 polypsin the sigmoid colon/polyps in the descending colon and the rectum/scattered diverticulum. hyperplastic  . COLONOSCOPY WITH ESOPHAGOGASTRODUODENOSCOPY (EGD) N/A 03/01/2013   UDJ:SHFWYOVZ colonic polyps - treated/removed as described above. Colonic diverticulosis. Hyperplastic. Next TCS 02/2018.  Lindsey Combs DIRECT LARYNGOSCOPY N/A 06/08/2016   Procedure: DIRECT LARYNGOSCOPY AND BIOPSY;  Surgeon: Leta Baptist, MD;  Location: [facility] OR;  Service: ENT;  Laterality: N/A;  . PARTIAL HYSTERECTOMY     age 63    SOCIAL HISTORY: Social History   Social History  . Marital status: Divorced    Spouse name: N/A  . Number of children: 4  . Years of education: N/A   Occupational History  . Not on file.   Social History Main Topics  . Smoking status: Current Every Day Smoker    Packs/day: 1.50    Years: 50.00    Types: Cigarettes  . Smokeless tobacco: Never Used  . Alcohol use No     Comment: glass of wine  occasionally  . Drug use: No  . Sexual activity: Not on file   Other Topics Concern  . Not on file   Social History Narrative  . No narrative on file   Divorced. Lives alone in senior housing 4 children; 2 sons and 2 daughters 6 grandchildren 3 great grandchildren Smoker since 89 or 80 yo. 1 ppd ETOH, used to big time, she liked beer. She stopped in 2001 when she started going to church. 1 dog named Eduard Clos, part terrier part [place] She  enjoys gardening.  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 Then she took care of her mom and dad until they went in retirement homes in 2008  FAMILY HISTORY: Family History  Problem Relation Age of Onset  . Leukemia Father   . Thyroid disease Father   . Stomach cancer Paternal Grandfather   . Colon cancer Neg Hx    Mother deceased at 15 yo. She had Alzheimer's but she was healthy Father deceased at 3 yo of blood cancer. He had to have a transfusion every 2 months. Then his potassium was too high and they couldn't give him anymore. 2 brothers. Oldest brother had a heart attack earlier this year. Other brother has chronic stomach ulcers  ALLERGIES:  is allergic to prednisone; chantix [varenicline]; and codeine.  MEDICATIONS:  Current Outpatient Prescriptions  Medication Sig Dispense Refill  . acetaminophen (TYLENOL) 500 MG tablet Take 500 mg by mouth every 6 (six) hours as needed for mild pain.    Lindsey Combs albuterol (PROAIR HFA) 108 (90 BASE) MCG/ACT inhaler Inhale 2 puffs into the lungs every 4 (four) hours as needed for wheezing or shortness of breath. 1 Inhaler 1  . ALPRAZolam (XANAX) 0.5 MG tablet Take 1 tablet (0.5 mg total) by mouth 2 (two) times daily as needed for anxiety. 15 tablet 0  . amLODipine (NORVASC) 5 MG tablet TAKE 1 TABLET BY MOUTH ONCE A DAY. 30 tablet 1  . atorvastatin (LIPITOR) 20 MG tablet Take 20 mg by mouth daily.    Lindsey Combs escitalopram (LEXAPRO) 10 MG tablet     . Esomeprazole Magnesium (NEXIUM PO) Take 1 capsule by mouth daily.    Lindsey Combs gabapentin (NEURONTIN) 100 MG capsule Take 1 capsule by mouth daily.    Lindsey Combs omeprazole (PRILOSEC) 20 MG capsule Take 1 capsule by mouth daily.    . SYMBICORT 160-4.5 MCG/ACT inhaler Inhale 2 puffs into the lungs 2 (two) times daily.    Lindsey Combs tiotropium (SPIRIVA) 18 MCG inhalation capsule Place 18 mcg into inhaler and inhale daily as needed. Shortness of breath    . tiZANidine (ZANAFLEX) 2 MG tablet Take 1 tablet (2 mg total)  by mouth 2 (two) times daily as needed for muscle spasms. 30 tablet 0   No current facility-administered medications for this visit.     Review of Systems  Eyes: Negative.   Respiratory: Positive for cough.        Denies difficulty swallowing or eating.   Cardiovascular: Negative.   Gastrointestinal: Negative.   Genitourinary: Negative.   Musculoskeletal: Negative.   Skin: Negative.   Neurological: Negative.   Endo/Heme/Allergies: Negative.   Psychiatric/Behavioral: The patient is not nervous/anxious.   All other systems reviewed and are negative. 14 point ROS was done and is otherwise as detailed above or in HPI   PHYSICAL EXAMINATION: ECOG PERFORMANCE STATUS: 1 - Symptomatic but completely ambulatory  Vitals:   06/14/17 1435  BP: (!) 170/56  Pulse: 80  Resp: 18  Temp: 98.2 F (36.8 C)  SpO2: 97%   Filed Weights   06/14/17 1435  Weight: 96 lb 8 oz (43.8 kg)      Physical Exam  Constitutional: She is oriented to person, place, and time and well-developed, well-nourished, and in no distress.  HENT:  Head: Normocephalic and atraumatic.  Mouth/Throat: Oropharynx is clear and moist.  Well healed from uvelectomy. No evidence of oral lesions. patient is edentulous.   Eyes: Pupils are equal, round, and reactive to light. Conjunctivae and EOM are normal. Right eye exhibits no discharge. Left eye exhibits no discharge. No scleral icterus.  Neck: Normal range of motion. Neck supple. No tracheal deviation present. No thyromegaly present.  Cardiovascular: Normal rate, regular rhythm and normal heart sounds.   No murmur heard. Pulmonary/Chest: Breath sounds normal. No respiratory distress. She has no wheezes. She has no rales. She exhibits no tenderness.  Abdominal: Soft. Bowel sounds are normal. She exhibits no distension and no mass. There is no rebound and no guarding.  Musculoskeletal: Normal range of motion. She exhibits no edema or tenderness.  Neurological: She is alert  and oriented to person, place, and time. Gait normal.  Skin: Skin is warm and dry. No rash noted. No erythema. No pallor.  Psychiatric: Mood, memory, affect and judgment normal.  Nursing note and vitals reviewed.   LABORATORY DATA:  I have reviewed the data as listed Lab Results  Component Value Date   WBC 6.7 02/22/2017   HGB 12.9 02/22/2017   HCT 38.6 02/22/2017   MCV 89.6 02/22/2017   PLT 266 02/22/2017   CMP     Component Value Date/Time   NA 132 (L) 02/23/2017 0544   NA 143 01/14/2013   K 3.6 02/23/2017 0544   K 4.7 01/14/2013   CL 86 (L) 02/23/2017 0544   CO2 38 (H) 02/23/2017 0544   GLUCOSE 111 (H) 02/23/2017 0544   BUN 15 02/23/2017 0544   BUN 10 01/14/2013   CREATININE 0.46 02/23/2017 0544   CREATININE 0.74 01/14/2013   CALCIUM 9.1 02/23/2017 0544   CALCIUM 10.2 01/14/2013   PROT 7.4 02/16/2017 1756   ALBUMIN 4.4 02/16/2017 1756   ALBUMIN 4.5 01/14/2013   AST 28 02/16/2017 1756   AST 12 01/14/2013   ALT 23 02/16/2017 1756   ALKPHOS 66 02/16/2017 1756   ALKPHOS 87 01/14/2013   BILITOT 0.4 02/16/2017 1756   BILITOT 0.3 01/14/2013   GFRNONAA >60 02/23/2017 0544   GFRAA >60 02/23/2017 0544     RADIOGRAPHIC STUDIES: I have personally reviewed the radiological images as listed and agreed with the findings in the report. Study Result   CLINICAL DATA:  Multiple falls.  Impact to the posterior head.  EXAM: CT HEAD WITHOUT CONTRAST  TECHNIQUE: Contiguous axial images were obtained from the base of the skull through the vertex without intravenous contrast.  COMPARISON:  None.  FINDINGS: Brain: No mass lesion, intraparenchymal hemorrhage or extra-axial collection. No evidence of acute cortical infarct. Brain parenchyma and CSF-containing spaces are normal for age.  Vascular: Atherosclerotic calcification of the internal carotid arteries at the skullbase.  Skull: Normal visualized skull base, calvarium and extracranial  soft tissues.  Sinuses/Orbits: No sinus fluid levels or advanced mucosal thickening. No mastoid effusion. Normal orbits.  IMPRESSION: Normal CT of the brain for age.   Electronically Signed   By: Ulyses Jarred M.D.   On: 09/21/2016 00:36      PATHOLOGY   ASSESSMENT & PLAN:  Squamous cell carcinoma of  the uvula, suspicious for invasion T1 N0 M0 squamous cell carcinoma of the uvula  Tobacco Abuse   Clinical no evidence of disease on exam today. Continue surveillance.  Recommened for patient to return to Dr. Benjamine Mola for continued follow up and exam.   Encouraged continued efforts for smoke cessation.   RTC in 6 months for follow up and exam. Patient knows to come see me sooner should she have any new symptoms arise in the interim prior to her next visit.   All questions were answered. The patient knows to call the clinic with any problems, questions or concerns.    This note was electronically signed.  Twana First, MD 06/14/2017 4:06 PM

## 2017-06-14 NOTE — Patient Instructions (Addendum)
[facility] Discharge Instructions  RECOMMENDATIONS MADE BY THE CONSULTANT AND ANY TEST RESULTS WILL BE SENT TO YOUR REFERRING PHYSICIAN.  You were seen today by Dr. Louise Zhou Follow up in 6 months   Thank you for choosing  Cancer Center at [facility] to provide your oncology and hematology care.  To afford each patient quality time with our provider, please arrive at least 15 minutes before your scheduled appointment time.    If you have a lab appointment with the Cancer Center please come in thru the  Main Entrance and check in at the main information desk  You need to re-schedule your appointment should you arrive 10 or more minutes late.  We strive to give you quality time with our providers, and arriving late affects you and other patients whose appointments are after yours.  Also, if you no show three or more times for appointments you may be dismissed from the clinic at the providers discretion.     Again, thank you for choosing [facility].  Our hope is that these requests will decrease the amount of time that you wait before being seen by our physicians.       _____________________________________________________________  Should you have questions after your visit to [facility], please contact our office at [phone] between the hours of 8:30 a.m. and 4:30 p.m.  Voicemails left after 4:30 p.m. will not be returned until the following business day.  For prescription refill requests, have your pharmacy contact our office.       Resources For Cancer Patients and their Caregivers ? American Cancer Society: Can assist with transportation, wigs, general needs, runs Look Good Feel Better.        1-888-227-6333 ? Cancer Care: Provides financial assistance, online support groups, medication/co-pay assistance.  1-800-813-HOPE (4673) ? [facility] Assists [place]  cancer patients and their families through emotional , educational and financial support.  336-427-4357 ? [place] DSS Where to apply for food stamps, Medicaid and utility assistance. 336-342-1394 ? [facility]: Transportation to medical appointments. 336-347-2287 ? Social Security Administration: May apply for disability if have a Stage IV cancer. 336-342-7796 1-800-772-1213 ? [place] Aging, Disability and Transit Services: Assists with nutrition, care and transit needs. 336-349-2343  Cancer Center Support Programs: @10RELATIVEDAYS@ > Cancer Support Group  2nd Tuesday of the month 1pm-2pm, Journey Room  > Creative Journey  3rd Tuesday of the month 1130am-1pm, Journey Room  > Look Good Feel Better  1st Wednesday of the month 10am-12 noon, Journey Room (Call American Cancer Society to register 1-800-395-5775)    

## 2017-07-13 ENCOUNTER — Emergency Department (HOSPITAL_COMMUNITY): Payer: Medicare Other

## 2017-07-13 ENCOUNTER — Encounter (HOSPITAL_COMMUNITY): Payer: Self-pay | Admitting: *Deleted

## 2017-07-13 ENCOUNTER — Observation Stay (HOSPITAL_COMMUNITY)
Admission: EM | Admit: 2017-07-13 | Discharge: 2017-07-14 | Disposition: A | Discharge status: Home / Self Care | Payer: Medicare Other | Attending: Internal Medicine | Admitting: Internal Medicine

## 2017-07-13 DIAGNOSIS — Y92009 Unspecified place in unspecified non-institutional (private) residence as the place of occurrence of the external cause: Secondary | ICD-10-CM | POA: Insufficient documentation

## 2017-07-13 DIAGNOSIS — Y999 Unspecified external cause status: Secondary | ICD-10-CM | POA: Insufficient documentation

## 2017-07-13 DIAGNOSIS — I10 Essential (primary) hypertension: Secondary | ICD-10-CM | POA: Insufficient documentation

## 2017-07-13 DIAGNOSIS — S72001A Fracture of unspecified part of neck of right femur, initial encounter for closed fracture: Secondary | ICD-10-CM | POA: Diagnosis not present

## 2017-07-13 DIAGNOSIS — E871 Hypo-osmolality and hyponatremia: Secondary | ICD-10-CM | POA: Diagnosis not present

## 2017-07-13 DIAGNOSIS — W19XXXA Unspecified fall, initial encounter: Secondary | ICD-10-CM | POA: Diagnosis not present

## 2017-07-13 DIAGNOSIS — R1111 Vomiting without nausea: Secondary | ICD-10-CM | POA: Insufficient documentation

## 2017-07-13 DIAGNOSIS — Y9389 Activity, other specified: Secondary | ICD-10-CM | POA: Diagnosis not present

## 2017-07-13 DIAGNOSIS — S79911A Unspecified injury of right hip, initial encounter: Secondary | ICD-10-CM | POA: Diagnosis present

## 2017-07-13 DIAGNOSIS — Z23 Encounter for immunization: Secondary | ICD-10-CM | POA: Insufficient documentation

## 2017-07-13 DIAGNOSIS — E785 Hyperlipidemia, unspecified: Secondary | ICD-10-CM | POA: Insufficient documentation

## 2017-07-13 DIAGNOSIS — F17210 Nicotine dependence, cigarettes, uncomplicated: Secondary | ICD-10-CM | POA: Insufficient documentation

## 2017-07-13 DIAGNOSIS — S72101A Unspecified trochanteric fracture of right femur, initial encounter for closed fracture: Secondary | ICD-10-CM | POA: Diagnosis not present

## 2017-07-13 DIAGNOSIS — J449 Chronic obstructive pulmonary disease, unspecified: Secondary | ICD-10-CM | POA: Diagnosis not present

## 2017-07-13 DIAGNOSIS — E7849 Other hyperlipidemia: Secondary | ICD-10-CM

## 2017-07-13 DIAGNOSIS — R1110 Vomiting, unspecified: Secondary | ICD-10-CM | POA: Diagnosis not present

## 2017-07-13 DIAGNOSIS — K219 Gastro-esophageal reflux disease without esophagitis: Secondary | ICD-10-CM | POA: Insufficient documentation

## 2017-07-13 DIAGNOSIS — R197 Diarrhea, unspecified: Secondary | ICD-10-CM | POA: Insufficient documentation

## 2017-07-13 DIAGNOSIS — W1830XA Fall on same level, unspecified, initial encounter: Secondary | ICD-10-CM | POA: Diagnosis not present

## 2017-07-13 DIAGNOSIS — S72009A Fracture of unspecified part of neck of unspecified femur, initial encounter for closed fracture: Secondary | ICD-10-CM | POA: Diagnosis present

## 2017-07-13 DIAGNOSIS — Z79899 Other long term (current) drug therapy: Secondary | ICD-10-CM | POA: Diagnosis not present

## 2017-07-13 LAB — CBC
HEMATOCRIT: 35.8 % — AB (ref 36.0–46.0)
Hemoglobin: 12.4 g/dL (ref 12.0–15.0)
MCH: 28.8 pg (ref 26.0–34.0)
MCHC: 34.6 g/dL (ref 30.0–36.0)
MCV: 83.1 fL (ref 78.0–100.0)
PLATELETS: 265 10*3/uL (ref 150–400)
RBC: 4.31 MIL/uL (ref 3.87–5.11)
RDW: 13.6 % (ref 11.5–15.5)
WBC: 10.8 10*3/uL — AB (ref 4.0–10.5)

## 2017-07-13 LAB — COMPREHENSIVE METABOLIC PANEL
ALK PHOS: 65 U/L (ref 38–126)
ALT: 24 U/L (ref 14–54)
AST: 38 U/L (ref 15–41)
Albumin: 4.5 g/dL (ref 3.5–5.0)
Anion gap: 11 (ref 5–15)
BILIRUBIN TOTAL: 1.1 mg/dL (ref 0.3–1.2)
BUN: 8 mg/dL (ref 6–20)
CO2: 27 mmol/L (ref 22–32)
CREATININE: 0.55 mg/dL (ref 0.44–1.00)
Calcium: 9.8 mg/dL (ref 8.9–10.3)
Chloride: 85 mmol/L — ABNORMAL LOW (ref 101–111)
GFR calc Af Amer: >60 mL/min (ref >60)
GLUCOSE: 106 mg/dL — AB (ref 65–99)
Potassium: 3.5 mmol/L (ref 3.5–5.1)
SODIUM: 123 mmol/L — AB (ref 135–145)
TOTAL PROTEIN: 7.2 g/dL (ref 6.5–8.1)

## 2017-07-13 LAB — DIFFERENTIAL
BASOS PCT: 0 %
Basophils Absolute: 0 10*3/uL (ref 0.0–0.1)
EOS ABS: 0 10*3/uL (ref 0.0–0.7)
Eosinophils Relative: 0 %
Lymphocytes Relative: 15 %
Lymphs Abs: 1.6 10*3/uL (ref 0.7–4.0)
MONOS PCT: 8 %
Monocytes Absolute: 0.8 10*3/uL (ref 0.1–1.0)
NEUTROS ABS: 8.3 10*3/uL — AB (ref 1.7–7.7)
Neutrophils Relative %: 77 %

## 2017-07-13 LAB — LIPASE, BLOOD: Lipase: 19 U/L (ref 11–51)

## 2017-07-13 MED ORDER — METHOCARBAMOL 500 MG PO TABS
500.0000 mg | ORAL_TABLET | Freq: Three times a day (TID) | ORAL | Status: DC
  Administered 2017-07-13 – 2017-07-14 (×3): 500 mg via ORAL
  Filled 2017-07-13 (×3): qty 1

## 2017-07-13 MED ORDER — ESCITALOPRAM OXALATE 10 MG PO TABS
10.0000 mg | ORAL_TABLET | Freq: Every day | ORAL | Status: DC
  Administered 2017-07-13 – 2017-07-14 (×2): 10 mg via ORAL
  Filled 2017-07-13 (×2): qty 1

## 2017-07-13 MED ORDER — SODIUM CHLORIDE 0.9 % IV BOLUS (SEPSIS)
1000.0000 mL | Freq: Once | INTRAVENOUS | Status: AC
  Administered 2017-07-13: 1000 mL via INTRAVENOUS

## 2017-07-13 MED ORDER — GABAPENTIN 100 MG PO CAPS
100.0000 mg | ORAL_CAPSULE | Freq: Every day | ORAL | Status: DC
  Administered 2017-07-13 – 2017-07-14 (×2): 100 mg via ORAL
  Filled 2017-07-13 (×2): qty 1

## 2017-07-13 MED ORDER — MORPHINE SULFATE (PF) 2 MG/ML IV SOLN: 0.5000 mg | INTRAVENOUS | Status: DC | PRN

## 2017-07-13 MED ORDER — ALBUTEROL SULFATE (2.5 MG/3ML) 0.083% IN NEBU
2.5000 mg | INHALATION_SOLUTION | Freq: Four times a day (QID) | RESPIRATORY_TRACT | Status: DC | PRN
  Administered 2017-07-14: 2.5 mg via RESPIRATORY_TRACT
  Filled 2017-07-13: qty 3

## 2017-07-13 MED ORDER — ALPRAZOLAM 0.5 MG PO TABS
0.5000 mg | ORAL_TABLET | Freq: Two times a day (BID) | ORAL | Status: DC | PRN
  Administered 2017-07-13 – 2017-07-14 (×2): 0.5 mg via ORAL
  Filled 2017-07-13 (×2): qty 1

## 2017-07-13 MED ORDER — ACETAMINOPHEN 325 MG PO TABS: 650.0000 mg | ORAL_TABLET | Freq: Four times a day (QID) | ORAL | Status: DC | PRN

## 2017-07-13 MED ORDER — INFLUENZA VAC SPLIT HIGH-DOSE 0.5 ML IM SUSY: 0.5000 mL | PREFILLED_SYRINGE | INTRAMUSCULAR | Status: DC

## 2017-07-13 MED ORDER — TIOTROPIUM BROMIDE MONOHYDRATE 18 MCG IN CAPS
18.0000 ug | ORAL_CAPSULE | Freq: Every day | RESPIRATORY_TRACT | Status: DC
  Administered 2017-07-14: 18 ug via RESPIRATORY_TRACT
  Filled 2017-07-13: qty 5

## 2017-07-13 MED ORDER — GUAIFENESIN ER 600 MG PO TB12
600.0000 mg | ORAL_TABLET | Freq: Two times a day (BID) | ORAL | Status: DC
  Administered 2017-07-14: 600 mg via ORAL
  Filled 2017-07-13 (×2): qty 1

## 2017-07-13 MED ORDER — AMLODIPINE BESYLATE 5 MG PO TABS
5.0000 mg | ORAL_TABLET | Freq: Every day | ORAL | Status: DC
  Administered 2017-07-13 – 2017-07-14 (×2): 5 mg via ORAL
  Filled 2017-07-13 (×2): qty 1

## 2017-07-13 MED ORDER — ENOXAPARIN SODIUM 40 MG/0.4ML ~~LOC~~ SOLN
40.0000 mg | SUBCUTANEOUS | Status: DC
  Administered 2017-07-13: 40 mg via SUBCUTANEOUS
  Filled 2017-07-13: qty 0.4

## 2017-07-13 MED ORDER — SODIUM CHLORIDE 0.9 % IV SOLN
INTRAVENOUS | Status: DC
  Administered 2017-07-13: 17:00:00 via INTRAVENOUS

## 2017-07-13 MED ORDER — BOOST / RESOURCE BREEZE PO LIQD
1.0000 | Freq: Three times a day (TID) | ORAL | Status: DC
  Administered 2017-07-13 – 2017-07-14 (×3): 1 via ORAL

## 2017-07-13 MED ORDER — POTASSIUM CHLORIDE IN NACL 20-0.9 MEQ/L-% IV SOLN
INTRAVENOUS | Status: DC
  Administered 2017-07-13 – 2017-07-14 (×2): via INTRAVENOUS

## 2017-07-13 MED ORDER — ATORVASTATIN CALCIUM 20 MG PO TABS
20.0000 mg | ORAL_TABLET | Freq: Every day | ORAL | Status: DC
  Administered 2017-07-13 – 2017-07-14 (×2): 20 mg via ORAL
  Filled 2017-07-13 (×2): qty 1
  Filled 2017-07-13: qty 2

## 2017-07-13 MED ORDER — ONDANSETRON HCL 4 MG/2ML IJ SOLN: 4.0000 mg | Freq: Four times a day (QID) | INTRAMUSCULAR | Status: DC | PRN

## 2017-07-13 MED ORDER — ONDANSETRON HCL 4 MG/2ML IJ SOLN
4.0000 mg | Freq: Once | INTRAMUSCULAR | Status: AC
Start: 2017-07-13 — End: 2017-07-13
  Administered 2017-07-13: 4 mg via INTRAVENOUS
  Filled 2017-07-13: qty 2

## 2017-07-13 MED ORDER — HYDROCODONE-ACETAMINOPHEN 5-325 MG PO TABS
1.0000 | ORAL_TABLET | Freq: Four times a day (QID) | ORAL | Status: DC | PRN
  Administered 2017-07-14 (×2): 1 via ORAL
  Filled 2017-07-13 (×2): qty 1

## 2017-07-13 MED ORDER — PANTOPRAZOLE SODIUM 40 MG PO TBEC
40.0000 mg | DELAYED_RELEASE_TABLET | Freq: Every day | ORAL | Status: DC
  Administered 2017-07-13 – 2017-07-14 (×2): 40 mg via ORAL
  Filled 2017-07-13 (×2): qty 1

## 2017-07-13 MED ORDER — MOMETASONE FURO-FORMOTEROL FUM 200-5 MCG/ACT IN AERO
2.0000 | INHALATION_SPRAY | Freq: Two times a day (BID) | RESPIRATORY_TRACT | Status: DC
  Administered 2017-07-14: 2 via RESPIRATORY_TRACT
  Filled 2017-07-13: qty 8.8

## 2017-07-13 NOTE — H&P (Signed)
History and Physical    Lindsey Combs PJA:[id number] DOB: May 31, 1946 DOA: 07/13/2017  PCP: [name], MD  Patient coming from: home  I have personally briefly reviewed patient's old medical records in [place]  Chief Complaint: fall  HPI: Lindsey Combs is a 71 y.o. female with medical history significant of COPD, who uses oxygen intermittently at home, hypertension, hyperlipidemia, began experiencing vomiting and diarrhea which began yesterday morning. The patient has been unable to keep anything down by mouth. This morning, when she woke up she was ambulating with her walker when she noticed her legs gave out. She denies any loss of consciousness/syncope. No lightheadedness or dizziness. Denies any chest pain or shortness of breath.After she fell to the floor, she experienced pain in her right hip/lower back. She's not had any fever or sick contacts.  ED Course: imaging of the right hip indicated fracture. Basic labs indicated hyponatremia with a sodium of 123. Vitals were otherwise normal. She's been referred for admission.  Review of Systems: As per HPI otherwise 10 point review of systems negative.    Past Medical History:  Diagnosis Date  . Acute respiratory failure with hypoxia ([place])   . Anxiety   . Cancer (HCC)    uvular per pt  . COPD (chronic obstructive pulmonary disease) ([place])   . DDD (degenerative disc disease), lumbar   . Degenerative joint disease of spine   . Diverticulosis   . GERD (gastroesophageal reflux disease)   . H. pylori infection    treated 02/2013.  [name] History of UTI   . Hypercholesterolemia   . Hypertension   . Hyponatremia   . Peripheral neuropathy   . Shortness of breath    with exertion  . Tobacco abuse     Past Surgical History:  Procedure Laterality Date  . ABDOMINAL EXPLORATION SURGERY     age 38, large ovarian cyst  . BIOPSY N/A 03/01/2013   HAL:PFXT hiatal hernia; otherwise, normal examination/ Status post biopsies as  described above. SB bx negative for Celiac. +h/pylori  . CATARACT EXTRACTION W/ INTRAOCULAR LENS  IMPLANT, BILATERAL    . COLONOSCOPY  10/16/09   Jenkins:3 polypsin the sigmoid colon/polyps in the descending colon and the rectum/scattered diverticulum. hyperplastic  . COLONOSCOPY WITH ESOPHAGOGASTRODUODENOSCOPY (EGD) N/A 03/01/2013   KWI:[id number] colonic polyps - treated/removed as described above. Colonic diverticulosis. Hyperplastic. Next TCS 02/2018.  [name] DIRECT LARYNGOSCOPY N/A 06/08/2016   Procedure: DIRECT LARYNGOSCOPY AND BIOPSY;  Surgeon: Leta Baptist, MD;  Location: [facility] OR;  Service: ENT;  Laterality: N/A;  . PARTIAL HYSTERECTOMY     age 72  . UVULECTOMY       reports that she has been smoking Cigarettes.  She has a 75.00 pack-year smoking history. She has never used smokeless tobacco. She reports that she drinks alcohol. She reports that she does not use drugs.  Allergies  Allergen Reactions  . Prednisone Nausea Only  . Chantix [Varenicline] Other (See Comments)    Mental status changes  . Codeine Itching    Family History  Problem Relation Age of Onset  . Leukemia Father   . Thyroid disease Father   . Stomach cancer Paternal Grandfather   . Colon cancer Neg Hx     Prior to Admission medications   Medication Sig Start Date End Date Taking? Authorizing Provider  acetaminophen (TYLENOL) 500 MG tablet Take 500 mg by mouth every 6 (six) hours as needed for mild pain.   Yes [provider]  albuterol (  PROAIR HFA) 108 (90 BASE) MCG/ACT inhaler Inhale 2 puffs into the lungs every 4 (four) hours as needed for wheezing or shortness of breath. 04/06/15  Yes Black, Lezlie Octave, NP  ALPRAZolam Duanne Moron) 0.5 MG tablet Take 1 tablet (0.5 mg total) by mouth 2 (two) times daily as needed for anxiety. 02/23/17  Yes Isaac Bliss, Rayford Halsted, MD  amLODipine (NORVASC) 5 MG tablet TAKE 1 TABLET BY MOUTH ONCE A DAY. 01/16/17  Yes Kefalas, Manon Hilding, PA-C  atorvastatin (LIPITOR) 20 MG tablet Take 20 mg  by mouth daily.   Yes [provider]  escitalopram (LEXAPRO) 10 MG tablet Take 10 mg by mouth daily.  06/02/17  Yes [provider]  Esomeprazole Magnesium (NEXIUM PO) Take 1 capsule by mouth daily.   Yes [provider]  gabapentin (NEURONTIN) 100 MG capsule Take 1 capsule by mouth daily. 01/23/17  Yes [provider]  guaiFENesin (MUCINEX) 600 MG 12 hr tablet Take 600 mg by mouth 2 (two) times daily.   Yes [provider]  omeprazole (PRILOSEC) 20 MG capsule Take 1 capsule by mouth daily. 02/13/17  Yes [provider]  SYMBICORT 160-4.5 MCG/ACT inhaler Inhale 2 puffs into the lungs 2 (two) times daily. 08/29/16  Yes [provider]  tiotropium (SPIRIVA) 18 MCG inhalation capsule Place 18 mcg into inhaler and inhale daily as needed. Shortness of breath   Yes [provider]  tiZANidine (ZANAFLEX) 2 MG tablet Take 1 tablet (2 mg total) by mouth 2 (two) times daily as needed for muscle spasms. 02/23/17   Erline Hau, MD    Physical Exam: Vitals:   07/13/17 1530 07/13/17 1545 07/13/17 1600 07/13/17 1630  BP: (!) 135/58  (!) 150/50 (!) 138/94  Pulse: 73 90  74  Resp:      Temp:      TempSrc:      SpO2: 100% 100% 93% 96%  Weight:      Height:        Constitutional: NAD, calm, comfortable Vitals:   07/13/17 1530 07/13/17 1545 07/13/17 1600 07/13/17 1630  BP: (!) 135/58  (!) 150/50 (!) 138/94  Pulse: 73 90  74  Resp:      Temp:      TempSrc:      SpO2: 100% 100% 93% 96%  Weight:      Height:       Eyes: PERRL, lids and conjunctivae normal ENMT: Mucous membranes are moist. Posterior pharynx clear of any exudate or lesions.Normal dentition.  Neck: normal, supple, no masses, no thyromegaly Respiratory: diminished breath sounds bilaterally, no wheezes. Normal respiratory effort. No accessory muscle use.  Cardiovascular: Regular rate and rhythm, no murmurs / rubs / gallops. No extremity edema. 2+ pedal  pulses. No carotid bruits.  Abdomen: no tenderness, no masses palpated. No hepatosplenomegaly. Bowel sounds positive.  Musculoskeletal: no clubbing / cyanosis. No joint deformity upper and lower extremities. No contractures. Normal muscle tone. Tenderness to palpation over the right hip  Skin: no rashes, lesions, ulcers. No induration Neurologic: CN 2-12 grossly intact. Sensation intact, DTR normal. Strength 5/5 in all 4.  Psychiatric: Normal judgment and insight. Alert and oriented x 3. Normal mood.   Labs on Admission: I have personally reviewed following labs and imaging studies  CBC:  Recent Labs Lab 07/13/17 0816  WBC 10.8*  NEUTROABS 8.3*  HGB 12.4  HCT 35.8*  MCV 83.1  PLT 536   Basic Metabolic Panel:  Recent Labs Lab 07/13/17 0816  NA 123*  K 3.5  CL 85*  CO2 27  GLUCOSE 106*  BUN 8  CREATININE 0.55  CALCIUM 9.8   GFR: Estimated Creatinine Clearance: 40.9 mL/min (by C-G formula based on SCr of 0.55 mg/dL). Liver Function Tests:  Recent Labs Lab 07/13/17 0816  AST 38  ALT 24  ALKPHOS 65  BILITOT 1.1  PROT 7.2  ALBUMIN 4.5    Recent Labs Lab 07/13/17 0816  LIPASE 19   No results for input(s): AMMONIA in the last 168 hours. Coagulation Profile: No results for input(s): INR, PROTIME in the last 168 hours. Cardiac Enzymes: No results for input(s): CKTOTAL, CKMB, CKMBINDEX, TROPONINI in the last 168 hours. BNP (last 3 results) No results for input(s): PROBNP in the last 8760 hours. HbA1C: No results for input(s): HGBA1C in the last 72 hours. CBG: No results for input(s): GLUCAP in the last 168 hours. Lipid Profile: No results for input(s): CHOL, HDL, LDLCALC, TRIG, CHOLHDL, LDLDIRECT in the last 72 hours. Thyroid Function Tests: No results for input(s): TSH, T4TOTAL, FREET4, T3FREE, THYROIDAB in the last 72 hours. Anemia Panel: No results for input(s): VITAMINB12, FOLATE, FERRITIN, TIBC, IRON, RETICCTPCT in the last 72 hours. Urine analysis:     Component Value Date/Time   COLORURINE YELLOW 09/21/2016 0246   APPEARANCEUR CLEAR 09/21/2016 0246   LABSPEC 1.013 09/21/2016 0246   PHURINE 6.0 09/21/2016 0246   GLUCOSEU NEGATIVE 09/21/2016 0246   HGBUR MODERATE (A) 09/21/2016 0246   BILIRUBINUR NEGATIVE 09/21/2016 0246   KETONESUR 5 (A) 09/21/2016 0246   PROTEINUR 100 (A) 09/21/2016 0246   UROBILINOGEN 0.2 04/04/2015 1918   NITRITE NEGATIVE 09/21/2016 0246   LEUKOCYTESUR NEGATIVE 09/21/2016 0246    Radiological Exams on Admission: Ct Hip Right Wo Contrast  Result Date: 07/13/2017 CLINICAL DATA:  Status post fall, right hip pain EXAM: CT OF THE RIGHT HIP WITHOUT CONTRAST TECHNIQUE: Multidetector CT imaging of the right hip was performed according to the standard protocol. Multiplanar CT image reconstructions were also generated. COMPARISON:  None. FINDINGS: Bones/Joint/Cartilage Acute comminuted, nondisplaced fracture of the right greater trochanter. No extension of the fracture into the proximal diaphysis or towards the lesser trochanter. No other fracture or dislocation. Normal alignment. No joint effusion. Ligaments Ligaments are suboptimally evaluated by CT. Muscles and Tendons Muscles are normal. No muscle atrophy. No intramuscular fluid collection or hematoma. Soft tissue No fluid collection or hematoma.  No soft tissue mass. IMPRESSION: 1. Acute comminuted, nondisplaced fracture of the right greater trochanter. No extension of the fracture into the proximal diaphysis or towards the lesser trochanter. Electronically Signed   By: Kathreen Devoid   On: 07/13/2017 09:44   Dg Hip Unilat  With Pelvis 2-3 Views Right  Result Date: 07/13/2017 CLINICAL DATA:  Recent falls and right hip pain, initial encounter EXAM: DG HIP (WITH OR WITHOUT PELVIS) 3V RIGHT COMPARISON:  09/20/2016 FINDINGS: The pelvic ring is intact. No dislocation is noted. Mild irregularity is noted at the superior aspect of the greater trochanter which appears slightly more  prominent than that seen on the prior exam. A fracture through the greater trochanter could not be totally excluded. Cross-sectional imaging may be helpful for further evaluation. IMPRESSION: Changes suggestive of fracture through the greater trochanter on the right. No other focal abnormality is seen. CT may be helpful for further evaluation. Electronically Signed   By: Inez Catalina M.D.   On: 07/13/2017 08:18    Assessment/Plan Active Problems:   GERD (gastroesophageal reflux disease)   COPD (  chronic obstructive pulmonary disease) (HCC)   Hyponatremia   Hyperlipidemia   Hypertension   Closed right hip fracture (HCC)   Vomiting and diarrhea   Fall at home, initial encounter   Hip fracture ([place])    1. Right hip fracture. Case discussed with Dr. Aline Brochure who has reviewed the images . He has recommended nonoperative management. We'll consult physical therapy. Continue with pain management. She is weightbearing as tolerated. 2. Hyponatremia. Suspect is related to the GI losses. She does have a history of hyponatremia in the past. Will hydrate with IV fluids and recheck labs in a.m . 3. Vomiting and diarrhea. Suspect this is gastroenteritis. Appears to be self-limited. She's not had any further episodes of vomiting and diarrhea in the emergency room. She is tolerating clear liquids. We will advance to full liquids today. Anticipate solid foods tomorrow. 4. Hypertension. Blood pressure stable. Continue outpatient management. 5. Hyperlipidemia. Continue statin 6. COPD . No signs of wheezing or worsening shortness of breath. Continue on Symbicort. Continue bronchodilators when necessary. 7. GERD. Continue on PPI  DVT prophylaxis: lovenox Code Status: full code Family Communication: discussed with son at the bedside Disposition Plan: pending physical therapy evaluation Consults called:  Admission status: observation/medsurg   MEMON,JEHANZEB MD [facility] Pager [phone]  If  7PM-7AM, please contact night-coverage [email or web address] Password [facility]  07/13/2017, 5:34 PM

## 2017-07-13 NOTE — ED Notes (Signed)
hospitalist at bedside

## 2017-07-13 NOTE — ED Triage Notes (Signed)
Pt was at home this morning and fell and landed on right hip. Pt states, "my legs just gave out". EMS reports pt was ambulatory. Pt reports she has been vomiting and having diarrhea since yesterday and had a doctors appt this morning. Pt denies abdominal pain.

## 2017-07-13 NOTE — ED Provider Notes (Signed)
[facility] EMERGENCY DEPARTMENT Provider Note   CSN: [id number] Arrival date & time: 07/13/17  5809     History   Chief Complaint Chief Complaint  Patient presents with  . Fall  . Emesis    HPI Lindsey Combs is a 71 y.o. female.  Patient states that she's been having some nausea vomiting and diarrhea and she got weak today and fell on her right hip. She was unable to get up.   The history is provided by the patient. No language interpreter was used.  Fall  This is a new problem. The current episode started 3 to 5 hours ago. The problem occurs constantly. The problem has not changed since onset.Associated symptoms include abdominal pain. Pertinent negatives include no chest pain and no headaches. Nothing aggravates the symptoms. Nothing relieves the symptoms. She has tried nothing for the symptoms. The treatment provided no relief.  Emesis   Associated symptoms include abdominal pain. Pertinent negatives include no cough, no diarrhea and no headaches.    Past Medical History:  Diagnosis Date  . Acute respiratory failure with hypoxia ([place])   . Anxiety   . Cancer (HCC)    uvular per pt  . COPD (chronic obstructive pulmonary disease) ([place])   . DDD (degenerative disc disease), lumbar   . Degenerative joint disease of spine   . Diverticulosis   . GERD (gastroesophageal reflux disease)   . H. pylori infection    treated 02/2013.  [name] History of UTI   . Hypercholesterolemia   . Hypertension   . Hyponatremia   . Peripheral neuropathy   . Shortness of breath    with exertion  . Tobacco abuse     Patient Active Problem List   Diagnosis Date Noted  . COPD with acute exacerbation ([place]) 02/16/2017  . Tobacco abuse 02/16/2017  . Malnutrition of moderate degree 09/22/2016  . Hypomagnesemia 09/21/2016  . Tobacco use 08/03/2016  . Hyperlipidemia 08/03/2016  . Hypertension 08/03/2016  . History of Helicobacter pylori infection 08/03/2016  . History of anemia 08/03/2016    . Diverticulosis 08/03/2016  . Arthritis 08/03/2016  . 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 Chronic obstructive pulmonary disease with acute exacerbation ([place])   . Acute respiratory failure with hypoxia ([place]) 04/03/2015  . COPD exacerbation ([place]) 04/03/2015  . Hyponatremia 04/03/2015  . Dehydration 04/03/2015  . Hyperglycemia 04/03/2015  . Anxiety   . COPD (chronic obstructive pulmonary disease) ([place])   . Peripheral neuropathy ([place])   . Tobacco dependence   . Back pain, chronic 04/19/2013  . Abnormal weight loss 02/19/2013  . Anemia, iron deficiency 02/19/2013  . GERD (gastroesophageal reflux disease) 02/19/2013    Past Surgical History:  Procedure Laterality Date  . ABDOMINAL EXPLORATION SURGERY     age 83, large ovarian cyst  . BIOPSY N/A 03/01/2013   XIP:JASN hiatal hernia; otherwise, normal examination/ Status post biopsies as described above. SB bx negative for Celiac. +h/pylori  . CATARACT EXTRACTION W/ INTRAOCULAR LENS  IMPLANT, BILATERAL    . COLONOSCOPY  10/16/09   Jenkins:3 polypsin the sigmoid colon/polyps in the descending colon and the rectum/scattered diverticulum. hyperplastic  . COLONOSCOPY WITH ESOPHAGOGASTRODUODENOSCOPY (EGD) N/A 03/01/2013   [id number]:[id number] colonic polyps - treated/removed as described above. Colonic diverticulosis. Hyperplastic. Next TCS 02/2018.  [name] DIRECT LARYNGOSCOPY N/A 06/08/2016   Procedure: DIRECT LARYNGOSCOPY AND BIOPSY;  Surgeon: Leta Baptist, MD;  Location: [facility] OR;  Service: ENT;  Laterality: N/A;  . PARTIAL HYSTERECTOMY     age 29  .  UVULECTOMY      OB History    Gravida Para Term Preterm AB Living   5 4 4   1      SAB TAB Ectopic Multiple Live Births   1               Home Medications    Prior to Admission medications   Medication Sig Start Date End Date Taking? Authorizing Provider  acetaminophen (TYLENOL) 500 MG tablet Take 500 mg by mouth every 6 (six) hours as needed for mild pain.   Yes [provider]  albuterol  (PROAIR HFA) 108 (90 BASE) MCG/ACT inhaler Inhale 2 puffs into the lungs every 4 (four) hours as needed for wheezing or shortness of breath. 04/06/15  Yes Black, Lezlie Octave, NP  ALPRAZolam Duanne Moron) 0.5 MG tablet Take 1 tablet (0.5 mg total) by mouth 2 (two) times daily as needed for anxiety. 02/23/17  Yes Isaac Bliss, Rayford Halsted, MD  amLODipine (NORVASC) 5 MG tablet TAKE 1 TABLET BY MOUTH ONCE A DAY. 01/16/17  Yes Kefalas, Manon Hilding, PA-C  atorvastatin (LIPITOR) 20 MG tablet Take 20 mg by mouth daily.   Yes [provider]  escitalopram (LEXAPRO) 10 MG tablet Take 10 mg by mouth daily.  06/02/17  Yes [provider]  Esomeprazole Magnesium (NEXIUM PO) Take 1 capsule by mouth daily.   Yes [provider]  gabapentin (NEURONTIN) 100 MG capsule Take 1 capsule by mouth daily. 01/23/17  Yes [provider]  guaiFENesin (MUCINEX) 600 MG 12 hr tablet Take 600 mg by mouth 2 (two) times daily.   Yes [provider]  omeprazole (PRILOSEC) 20 MG capsule Take 1 capsule by mouth daily. 02/13/17  Yes [provider]  SYMBICORT 160-4.5 MCG/ACT inhaler Inhale 2 puffs into the lungs 2 (two) times daily. 08/29/16  Yes [provider]  tiotropium (SPIRIVA) 18 MCG inhalation capsule Place 18 mcg into inhaler and inhale daily as needed. Shortness of breath   Yes [provider]  tiZANidine (ZANAFLEX) 2 MG tablet Take 1 tablet (2 mg total) by mouth 2 (two) times daily as needed for muscle spasms. 02/23/17   Isaac Bliss, Rayford Halsted, MD    Family History Family History  Problem Relation Age of Onset  . Leukemia Father   . Thyroid disease Father   . Stomach cancer Paternal Grandfather   . Colon cancer Neg Hx     Social History Social History  Substance Use Topics  . Smoking status: Current Every Day Smoker    Packs/day: 1.50    Years: 50.00    Types: Cigarettes  . Smokeless tobacco: Never Used  . Alcohol use Yes     Comment: glass of wine  occasionally     Allergies   Prednisone; Chantix [varenicline]; and Codeine   Review of Systems Review of Systems  Constitutional: Negative for appetite change and fatigue.  HENT: Negative for congestion, ear discharge and sinus pressure.   Eyes: Negative for discharge.  Respiratory: Negative for cough.   Cardiovascular: Negative for chest pain.  Gastrointestinal: Positive for abdominal pain and vomiting. Negative for diarrhea.  Genitourinary: Negative for frequency and hematuria.  Musculoskeletal: Negative for back pain.       Right hip pain  Skin: Negative for rash.  Neurological: Negative for seizures and headaches.  Psychiatric/Behavioral: Negative for hallucinations.     Physical Exam Updated Vital Signs BP (!) 143/71   Pulse 88   Temp 98.3 F (36.8 C) (Oral)  Resp 16   Ht 5' 3.5" (1.613 m)   Wt 40.8 kg (90 lb)   SpO2 99%   BMI 15.69 kg/m   Physical Exam  Constitutional: She is oriented to person, place, and time. She appears well-developed.  HENT:  Head: Normocephalic.  Eyes: Conjunctivae and EOM are normal. No scleral icterus.  Neck: Neck supple. No thyromegaly present.  Cardiovascular: Normal rate and regular rhythm.  Exam reveals no gallop and no friction rub.   No murmur heard. Pulmonary/Chest: No stridor. She has no wheezes. She has no rales. She exhibits no tenderness.  Abdominal: She exhibits no distension. There is no tenderness. There is no rebound.  Musculoskeletal: Normal range of motion. She exhibits no edema.  Mild tenderness right hip  Lymphadenopathy:    She has no cervical adenopathy.  Neurological: She is oriented to person, place, and time. She exhibits normal muscle tone. Coordination normal.  Skin: No rash noted. No erythema.  Psychiatric: She has a normal mood and affect. Her behavior is normal.     ED Treatments / Results  Labs (all labs ordered are listed, but only abnormal results are displayed) Labs Reviewed  COMPREHENSIVE  METABOLIC PANEL - Abnormal; Notable for the following:       Result Value   Sodium 123 (*)    Chloride 85 (*)    Glucose, Bld 106 (*)    All other components within normal limits  CBC - Abnormal; Notable for the following:    WBC 10.8 (*)    HCT 35.8 (*)    All other components within normal limits  DIFFERENTIAL - Abnormal; Notable for the following:    Neutro Abs 8.3 (*)    All other components within normal limits  LIPASE, BLOOD    EKG  EKG Interpretation None       Radiology Ct Hip Right Wo Contrast  Result Date: 07/13/2017 CLINICAL DATA:  Status post fall, right hip pain EXAM: CT OF THE RIGHT HIP WITHOUT CONTRAST TECHNIQUE: Multidetector CT imaging of the right hip was performed according to the standard protocol. Multiplanar CT image reconstructions were also generated. COMPARISON:  None. FINDINGS: Bones/Joint/Cartilage Acute comminuted, nondisplaced fracture of the right greater trochanter. No extension of the fracture into the proximal diaphysis or towards the lesser trochanter. No other fracture or dislocation. Normal alignment. No joint effusion. Ligaments Ligaments are suboptimally evaluated by CT. Muscles and Tendons Muscles are normal. No muscle atrophy. No intramuscular fluid collection or hematoma. Soft tissue No fluid collection or hematoma.  No soft tissue mass. IMPRESSION: 1. Acute comminuted, nondisplaced fracture of the right greater trochanter. No extension of the fracture into the proximal diaphysis or towards the lesser trochanter. Electronically Signed   By: Kathreen Devoid   On: 07/13/2017 09:44   Dg Hip Unilat  With Pelvis 2-3 Views Right  Result Date: 07/13/2017 CLINICAL DATA:  Recent falls and right hip pain, initial encounter EXAM: DG HIP (WITH OR WITHOUT PELVIS) 3V RIGHT COMPARISON:  09/20/2016 FINDINGS: The pelvic ring is intact. No dislocation is noted. Mild irregularity is noted at the superior aspect of the greater trochanter which appears slightly more  prominent than that seen on the prior exam. A fracture through the greater trochanter could not be totally excluded. Cross-sectional imaging may be helpful for further evaluation. IMPRESSION: Changes suggestive of fracture through the greater trochanter on the right. No other focal abnormality is seen. CT may be helpful for further evaluation. Electronically Signed   By: Linus Mako.D.  On: 07/13/2017 08:18    Procedures Procedures (including critical care time)  Medications Ordered in ED Medications  sodium chloride 0.9 % bolus 1,000 mL (0 mLs Intravenous Stopped 07/13/17 1007)  ondansetron (ZOFRAN) injection 4 mg (4 mg Intravenous Given 07/13/17 7614)     Initial Impression / Assessment and Plan / ED Course  I have reviewed the triage vital signs and the nursing notes.  Pertinent labs & imaging results that were available during my care of the patient were reviewed by me and considered in my medical decision making (see chart for details).     Patient has a nonoperative hip fracture on the right side. According to the orthopedic surgeon Dr. Aline Brochure, the patient can be treated with a walker and pain medicine and follow-up with him.  Patient is hyponatremic and she will be admitted to medicine for diarrhea vomiting hyponatremia  Final Clinical Impressions(s) / ED Diagnoses   Final diagnoses:  Hyponatremia    New Prescriptions New Prescriptions   No medications on file     Milton Ferguson, MD 07/13/17 1120

## 2017-07-13 NOTE — ED Notes (Signed)
Pt in CT.

## 2017-07-14 DIAGNOSIS — E871 Hypo-osmolality and hyponatremia: Secondary | ICD-10-CM | POA: Diagnosis not present

## 2017-07-14 DIAGNOSIS — S72001A Fracture of unspecified part of neck of right femur, initial encounter for closed fracture: Secondary | ICD-10-CM | POA: Diagnosis not present

## 2017-07-14 DIAGNOSIS — J449 Chronic obstructive pulmonary disease, unspecified: Secondary | ICD-10-CM | POA: Diagnosis not present

## 2017-07-14 DIAGNOSIS — S72101A Unspecified trochanteric fracture of right femur, initial encounter for closed fracture: Secondary | ICD-10-CM | POA: Diagnosis not present

## 2017-07-14 DIAGNOSIS — W19XXXA Unspecified fall, initial encounter: Secondary | ICD-10-CM | POA: Diagnosis not present

## 2017-07-14 LAB — BASIC METABOLIC PANEL
ANION GAP: 6 (ref 5–15)
BUN: 7 mg/dL (ref 6–20)
CALCIUM: 8.5 mg/dL — AB (ref 8.9–10.3)
CO2: 29 mmol/L (ref 22–32)
CREATININE: 0.59 mg/dL (ref 0.44–1.00)
Chloride: 97 mmol/L — ABNORMAL LOW (ref 101–111)
Glucose, Bld: 100 mg/dL — ABNORMAL HIGH (ref 65–99)
Potassium: 3.2 mmol/L — ABNORMAL LOW (ref 3.5–5.1)
SODIUM: 132 mmol/L — AB (ref 135–145)

## 2017-07-14 LAB — CBC
HEMATOCRIT: 29.1 % — AB (ref 36.0–46.0)
Hemoglobin: 10 g/dL — ABNORMAL LOW (ref 12.0–15.0)
MCH: 29.6 pg (ref 26.0–34.0)
MCHC: 34.4 g/dL (ref 30.0–36.0)
MCV: 86.1 fL (ref 78.0–100.0)
Platelets: 205 10*3/uL (ref 150–400)
RBC: 3.38 MIL/uL — ABNORMAL LOW (ref 3.87–5.11)
RDW: 14.2 % (ref 11.5–15.5)
WBC: 9.4 10*3/uL (ref 4.0–10.5)

## 2017-07-14 MED ORDER — ADULT MULTIVITAMIN W/MINERALS CH
1.0000 | ORAL_TABLET | Freq: Every day | ORAL | Status: DC
  Administered 2017-07-14: 1 via ORAL
  Filled 2017-07-14: qty 1

## 2017-07-14 MED ORDER — INFLUENZA VAC SPLIT HIGH-DOSE 0.5 ML IM SUSY: 0.5000 mL | PREFILLED_SYRINGE | INTRAMUSCULAR | Status: DC | PRN

## 2017-07-14 MED ORDER — HYDROCODONE-ACETAMINOPHEN 5-325 MG PO TABS: 1.0000 | ORAL_TABLET | Freq: Four times a day (QID) | ORAL | 0 refills | Status: DC | PRN

## 2017-07-14 MED ORDER — METHOCARBAMOL 500 MG PO TABS: 500.0000 mg | ORAL_TABLET | Freq: Three times a day (TID) | ORAL | 0 refills | Status: DC | PRN

## 2017-07-14 MED ORDER — ENOXAPARIN SODIUM 30 MG/0.3ML ~~LOC~~ SOLN: 30.0000 mg | SUBCUTANEOUS | Status: DC

## 2017-07-14 MED ORDER — ENSURE ENLIVE PO LIQD
237.0000 mL | Freq: Two times a day (BID) | ORAL | Status: DC
  Administered 2017-07-14: 237 mL via ORAL

## 2017-07-14 MED ORDER — INFLUENZA VAC SPLIT HIGH-DOSE 0.5 ML IM SUSY
0.5000 mL | PREFILLED_SYRINGE | Freq: Once | INTRAMUSCULAR | Status: AC
  Administered 2017-07-14: 0.5 mL via INTRAMUSCULAR
  Filled 2017-07-14: qty 0.5

## 2017-07-14 MED ORDER — POTASSIUM CHLORIDE CRYS ER 20 MEQ PO TBCR
40.0000 meq | EXTENDED_RELEASE_TABLET | Freq: Once | ORAL | Status: AC
  Administered 2017-07-14: 40 meq via ORAL
  Filled 2017-07-14: qty 2

## 2017-07-14 NOTE — Discharge Summary (Signed)
Physician Discharge Summary  Lindsey Combs HQP:[id number] DOB: 02/16/1946 DOA: 07/13/2017  PCP: [name], MD  Admit date: 07/13/2017 Discharge date: 07/14/2017  Admitted From: Home Disposition:  Home  Recommendations for Outpatient Follow-up:  1. Follow up with PCP in 1-2 weeks 2. Please obtain BMP/CBC in one week 3. Follow up with orthopedics in 2 weeks  Home Health:Home health PT Equipment/Devices:  Discharge Condition:Stable CODE STATUS:Full code Diet recommendation: Heart Healthy   Brief/Interim Summary: 71 -year-old female who presents to the hospital after suffering a fall. She denies having vomiting and diarrhea for approximately 1 day prior to falling. She became increasingly weak and fell due to her legs giving out. She presents to the hospital and was found to have a right hip fracture. She also was noted to be hyponatremic and dehydrated. She was started on IV fluids which correct her dehydration. Vomiting and diarrhea related to gastroenteritis which appears to have resolved. She is tolerating a solid diet. Dr. Aline Brochure, orthopedics reviewed patient's imaging and did not feel that operative management was indicated. Recommendations were for weightbearing as tolerated and follow up with orthopedics in 2 weeks. She was seen by physical therapy who recommended home health physical therapy. Her pain is adequately managed at this time.   Discharge Diagnoses:  Active Problems:   GERD (gastroesophageal reflux disease)   COPD (chronic obstructive pulmonary disease) (HCC)   Hyponatremia   Hyperlipidemia   Hypertension   Closed right hip fracture (HCC)   Vomiting and diarrhea   Fall at home, initial encounter   Hip fracture ([place])  1. Right hip fracture. Case discussed with Dr. Aline Brochure who has reviewed the images . He has recommended nonoperative management. Continue with pain management. She is weightbearing as tolerated. Seen by physical therapy who recommended home  health physical therapy. She will follow-up with Dr. Aline Brochure in 2 weeks 2. Hyponatremia. Suspect is related to the GI losses. Patient was hydrated with IV fluids and follow-up sodium is improved. . 3. Vomiting and diarrhea. Suspect this is gastroenteritis. Appears to be self-limited. She's not had any further episodes of vomiting and diarrhea since admission. She was tolerating a diet. 4. Hypertension. Blood pressure stable. Continue outpatient management. 5. Hyperlipidemia. Continue statin 6. COPD . No signs of wheezing or worsening shortness of breath. Continue on Symbicort. Continue bronchodilators when necessary. 7. GERD. Continue on PPI  Discharge Instructions  Discharge Instructions    Diet - low sodium heart healthy    Complete by:  As directed    Increase activity slowly    Complete by:  As directed      Allergies as of 07/14/2017      Reactions   Prednisone Nausea Only   Chantix [varenicline] Other (See Comments)   Mental status changes   Codeine Itching      Medication List    STOP taking these medications   NEXIUM PO   tiZANidine 2 MG tablet Commonly known as:  ZANAFLEX     TAKE these medications   acetaminophen 500 MG tablet Commonly known as:  TYLENOL Take 500 mg by mouth every 6 (six) hours as needed for mild pain.   albuterol 108 (90 Base) MCG/ACT inhaler Commonly known as:  PROAIR HFA Inhale 2 puffs into the lungs every 4 (four) hours as needed for wheezing or shortness of breath.   ALPRAZolam 0.5 MG tablet Commonly known as:  XANAX Take 1 tablet (0.5 mg total) by mouth 2 (two) times daily as needed for anxiety.  amLODipine 5 MG tablet Commonly known as:  NORVASC TAKE 1 TABLET BY MOUTH ONCE A DAY.   atorvastatin 20 MG tablet Commonly known as:  LIPITOR Take 20 mg by mouth daily.   escitalopram 10 MG tablet Commonly known as:  LEXAPRO Take 10 mg by mouth daily.   gabapentin 100 MG capsule Commonly known as:  NEURONTIN Take 1 capsule by  mouth daily.   guaiFENesin 600 MG 12 hr tablet Commonly known as:  MUCINEX Take 600 mg by mouth 2 (two) times daily.   HYDROcodone-acetaminophen 5-325 MG tablet Commonly known as:  NORCO/VICODIN Take 1-2 tablets by mouth every 6 (six) hours as needed for moderate pain.   methocarbamol 500 MG tablet Commonly known as:  ROBAXIN Take 1 tablet (500 mg total) by mouth every 8 (eight) hours as needed for muscle spasms.   omeprazole 20 MG capsule Commonly known as:  PRILOSEC Take 1 capsule by mouth daily.   SYMBICORT 160-4.5 MCG/ACT inhaler Generic drug:  budesonide-formoterol Inhale 2 puffs into the lungs 2 (two) times daily.   tiotropium 18 MCG inhalation capsule Commonly known as:  SPIRIVA Place 18 mcg into inhaler and inhale daily as needed. Shortness of breath      Follow-up Information    Carole Civil, MD Follow up.   Specialties:  Orthopedic Surgery, Radiology Why:  they will call you with an appointment Contact information: [place] [address] [phone]          Allergies  Allergen Reactions  . Prednisone Nausea Only  . Chantix [Varenicline] Other (See Comments)    Mental status changes  . Codeine Itching    Consultations:     Procedures/Studies: Ct Hip Right Wo Contrast  Result Date: 07/13/2017 CLINICAL DATA:  Status post fall, right hip pain EXAM: CT OF THE RIGHT HIP WITHOUT CONTRAST TECHNIQUE: Multidetector CT imaging of the right hip was performed according to the standard protocol. Multiplanar CT image reconstructions were also generated. COMPARISON:  None. FINDINGS: Bones/Joint/Cartilage Acute comminuted, nondisplaced fracture of the right greater trochanter. No extension of the fracture into the proximal diaphysis or towards the lesser trochanter. No other fracture or dislocation. Normal alignment. No joint effusion. Ligaments Ligaments are suboptimally evaluated by CT. Muscles and Tendons Muscles are normal. No muscle  atrophy. No intramuscular fluid collection or hematoma. Soft tissue No fluid collection or hematoma.  No soft tissue mass. IMPRESSION: 1. Acute comminuted, nondisplaced fracture of the right greater trochanter. No extension of the fracture into the proximal diaphysis or towards the lesser trochanter. Electronically Signed   By: Kathreen Devoid   On: 07/13/2017 09:44   Dg Hip Unilat  With Pelvis 2-3 Views Right  Result Date: 07/13/2017 CLINICAL DATA:  Recent falls and right hip pain, initial encounter EXAM: DG HIP (WITH OR WITHOUT PELVIS) 3V RIGHT COMPARISON:  09/20/2016 FINDINGS: The pelvic ring is intact. No dislocation is noted. Mild irregularity is noted at the superior aspect of the greater trochanter which appears slightly more prominent than that seen on the prior exam. A fracture through the greater trochanter could not be totally excluded. Cross-sectional imaging may be helpful for further evaluation. IMPRESSION: Changes suggestive of fracture through the greater trochanter on the right. No other focal abnormality is seen. CT may be helpful for further evaluation. Electronically Signed   By: Inez Catalina M.D.   On: 07/13/2017 08:18       Subjective: Feeling better. No vomiting or diarrhea. Pain in right hip is improving.  Discharge Exam: Vitals:   07/14/17 0516 07/14/17 1117  BP:    Pulse:    Resp:    Temp:    SpO2: 98% 98%   Vitals:   07/14/17 0400 07/14/17 0516 07/14/17 1117 07/14/17 1259  BP: (!) 145/50     Pulse: 72     Resp: 16     Temp: 98.2 F (36.8 C)     TempSrc: Oral     SpO2: 96% 98% 98%   Weight:    44.1 kg (97 lb 2 oz)  Height:        General: Pt is alert, awake, not in acute distress Cardiovascular: RRR, S1/S2 +, no rubs, no gallops Respiratory: CTA bilaterally, no wheezing, no rhonchi Abdominal: Soft, NT, ND, bowel sounds + Extremities: no edema, no cyanosis    The results of significant diagnostics from this hospitalization (including imaging,  microbiology, ancillary and laboratory) are listed below for reference.     Microbiology: No results found for this or any previous visit (from the past 240 hour(s)).   Labs: BNP (last 3 results) No results for input(s): BNP in the last 8760 hours. Basic Metabolic Panel:  Recent Labs Lab 07/13/17 0816 07/14/17 0620  NA 123* 132*  K 3.5 3.2*  CL 85* 97*  CO2 27 29  GLUCOSE 106* 100*  BUN 8 7  CREATININE 0.55 0.59  CALCIUM 9.8 8.5*   Liver Function Tests:  Recent Labs Lab 07/13/17 0816  AST 38  ALT 24  ALKPHOS 65  BILITOT 1.1  PROT 7.2  ALBUMIN 4.5    Recent Labs Lab 07/13/17 0816  LIPASE 19   No results for input(s): AMMONIA in the last 168 hours. CBC:  Recent Labs Lab 07/13/17 0816 07/14/17 0620  WBC 10.8* 9.4  NEUTROABS 8.3*  --   HGB 12.4 10.0*  HCT 35.8* 29.1*  MCV 83.1 86.1  PLT 265 205   Cardiac Enzymes: No results for input(s): CKTOTAL, CKMB, CKMBINDEX, TROPONINI in the last 168 hours. BNP: Invalid input(s): POCBNP CBG: No results for input(s): GLUCAP in the last 168 hours. D-Dimer No results for input(s): DDIMER in the last 72 hours. Hgb A1c No results for input(s): HGBA1C in the last 72 hours. Lipid Profile No results for input(s): CHOL, HDL, LDLCALC, TRIG, CHOLHDL, LDLDIRECT in the last 72 hours. Thyroid function studies No results for input(s): TSH, T4TOTAL, T3FREE, THYROIDAB in the last 72 hours.  Invalid input(s): FREET3 Anemia work up No results for input(s): VITAMINB12, FOLATE, FERRITIN, TIBC, IRON, RETICCTPCT in the last 72 hours. Urinalysis    Component Value Date/Time   COLORURINE YELLOW 09/21/2016 0246   APPEARANCEUR CLEAR 09/21/2016 0246   LABSPEC 1.013 09/21/2016 0246   PHURINE 6.0 09/21/2016 0246   GLUCOSEU NEGATIVE 09/21/2016 0246   HGBUR MODERATE (A) 09/21/2016 0246   BILIRUBINUR NEGATIVE 09/21/2016 0246   KETONESUR 5 (A) 09/21/2016 0246   PROTEINUR 100 (A) 09/21/2016 0246   UROBILINOGEN 0.2 04/04/2015 1918    NITRITE NEGATIVE 09/21/2016 0246   LEUKOCYTESUR NEGATIVE 09/21/2016 0246   Sepsis Labs Invalid input(s): PROCALCITONIN,  WBC,  LACTICIDVEN Microbiology No results found for this or any previous visit (from the past 240 hour(s)).   Time coordinating discharge: Over 30 minutes  SIGNED:   Kathie Dike, MD  [facility] 07/14/2017, 2:44 PM Pager   If 7PM-7AM, please contact night-coverage [email or web address] Password [email or web address]

## 2017-07-14 NOTE — Progress Notes (Signed)
Initial Nutrition Assessment  DOCUMENTATION CODES:   Non-severe (moderate) malnutrition in context of chronic illness, Underweight  INTERVENTION:  Discontinue: Boost Breeze po TID   Start:  Ensure Enlive po BID, each supplement provides 350 kcal and 20 grams of protein   Recommend supplement vitamin C and MVI daily   NUTRITION DIAGNOSIS:   Malnutrition (moderate -non severe) related to chronic illness, poor appetite (comorbidities-COPD, Carcinoma) as evidenced by mild depletion of body fat, moderate depletions of muscle mass, per patient/family report, meal completion < 50%.   GOAL:   Patient will meet greater than or equal to 90% of their needs  MONITOR:   PO intake, Supplement acceptance, Weight trends, Labs  REASON FOR ASSESSMENT:   Malnutrition Screening Tool    ASSESSMENT: Lindsey Combs is an underweight 71 yo female with a hx of COPD, DDD, GERD, H.Pylori, Hyponatremia, UTI, squamous cell carcinoma and tobacco abuse (55 years). S/p Uvulectomy in November at [facility]. Patient says she usually eats a good breakfast either eggs, bacon or sausage or cereal and toast. At lunch she has sandwich and/or soup and dinner varies. She usually eats around 9;30 am, 2 pm and between 6-7 pm daily. Likes peanut butter crackers for snack. She doesn't take any vitamin supplements at home or drink nutrition shakes. Her weight is under average for height range but relatively stable since January. She is down overall 5-6# compared to this time last year but is not clinically significant. Expect she is chronically undernourished. Moderate muscle loss to temporalis, dorsal and patellar also mild-moderate subcutaneous fat loss to thoracic region and orbitally (slightly dark circles and somewhat hollow look).  Recent Labs Lab 07/13/17 0816 07/14/17 0620  NA 123* 132*  K 3.5 3.2*  CL 85* 97*  CO2 27 29  BUN 8 7  CREATININE 0.55 0.59  CALCIUM 9.8 8.5*  GLUCOSE 106* 100*    Labs: sodium 132 (hx of)- improving from admission, potassium 3.2   Diet Order:  Diet Heart Room service appropriate? Yes; Fluid consistency: Thin  Skin:  Reviewed, no issues  Last BM:  unknown  Height:   Ht Readings from Last 1 Encounters:  07/13/17 5' 3.5" (1.613 m)    Weight:   Wt Readings from Last 1 Encounters:  07/14/17 97 lb 2 oz (44.1 kg)    Ideal Body Weight:  52.2 kg  BMI:  Body mass index is 16.94 kg/m.  Estimated Nutritional Needs:   Kcal:  1386-1470 (33-35 kcal/kg)  Protein:  59-67 gr  Fluid:  >1300 ml daily  EDUCATION NEEDS: reviewed good sources of protein and encouraged fresh fruits and vegetables daily.   Education needs addressed Colman Cater Lindsey,RD,CSG,LDN Office: [phone] Pager: [phone]

## 2017-07-14 NOTE — Clinical Social Work Placement (Signed)
   CLINICAL SOCIAL WORK PLACEMENT  NOTE  Date:  07/14/2017  Patient Details  Name: Lindsey Combs MRN: [id number] Date of Birth: [birth year]  Clinical Social Work is seeking post-discharge placement for this patient at the [place] level of care (*CSW will initial, date and re-position this form in  chart as items are completed):  Yes   Patient/family provided with [place] Work Department's list of facilities offering this level of care within the geographic area requested by the patient (or if unable, by the patient's family).  Yes   Patient/family informed of their freedom to choose among providers that offer the needed level of care, that participate in Medicare, Medicaid or managed care program needed by the patient, have an available bed and are willing to accept the patient.  Yes   Patient/family informed of [place]'s ownership interest in [facility] and [facility], as well as of the fact that they are under no obligation to receive care at these facilities.  PASRR submitted to EDS on       PASRR number received on       Existing PASRR number confirmed on 07/14/17     FL2 transmitted to all facilities in geographic area requested by pt/family on 07/14/17     FL2 transmitted to all facilities within larger geographic area on       Patient informed that his/her managed care company has contracts with or will negotiate with certain facilities, including the following:            Patient/family informed of bed offers received.  Patient chooses bed at       Physician recommends and patient chooses bed at      Patient to be transferred to   on  .  Patient to be transferred to facility by       Patient family notified on   of transfer.  Name of family member notified:        PHYSICIAN       Additional Comment:   Patient continues to consider SNF. Patient was agreeable to have her clinical information sent to [facility], [facility] nd [facility].  _______________________________________________ Ambrose Pancoast D, LCSW 07/14/2017, 1:46 PM

## 2017-07-14 NOTE — Care Management Note (Signed)
Case Management Note  Patient Details  Name: Lindsey Combs MRN: [id number] Date of Birth: 1946-07-13  Subjective/Objective:  Adm with hip fx, COPD. From home alone, ind with ADL's. Has RW, 02, BSC, portable O2 concentrator pta. She has been recommended for [facility] PT. She would like [place].                   Action/Plan: CM left voicemail with Tim of [facility], who will obtain orders from [facility]. Anticipate DC today.   Expected Discharge Date:      07/14/2017            Expected Discharge Plan:  [place]  In-House Referral:  Clinical Social Work  Discharge planning Services  CM Consult  Post Acute Care Choice:  Home Health Choice offered to:  Patient  DME Arranged:    DME Agency:     HH Arranged:  PT [place]:  [facility] (now [facility])  Status of Service:  Completed, signed off  If discussed at [employer] of Stay Meetings, dates discussed:    Additional Comments:  Braelin Costlow, Chauncey Reading, RN 07/14/2017, 2:08 PM

## 2017-07-14 NOTE — Clinical Social Work Note (Signed)
Patient has made the decision to go home with HHPT.   LCSW signing off.     Damara Klunder, Clydene Pugh, LCSW

## 2017-07-14 NOTE — Evaluation (Signed)
Physical Therapy Evaluation Patient Details Name: Lindsey Combs MRN: [id number] DOB: 1946/09/06 Today's Date: 07/14/2017   History of Present Illness   Lindsey Combs is a 71 y.o. female with medical history significant of COPD, who uses oxygen intermittently at home, hypertension, hyperlipidemia, began experiencing vomiting and diarrhea which began yesterday morning. The patient has been unable to keep anything down by mouth. This morning, when she woke up she was ambulating with her walker when she noticed her legs gave out. She denies any loss of consciousness/syncope. No lightheadedness or dizziness. Denies any chest pain or shortness of breath.After she fell to the floor, she experienced pain in her right hip/lower back. She's not had any fever or sick contacts.    Clinical Impression  Patient functioning near baseline and demonstrates good return for use of RW during gait training without loss of balance and well aware of her limitations.  Patient to be discharged home today and discharged from physical therapy to nursing for ambulation as tolerated for length of stay.    Follow Up Recommendations Home health PT;Supervision for mobility/OOB    Equipment Recommendations       Recommendations for Other Services       Precautions / Restrictions Precautions Precautions: Fall Restrictions Weight Bearing Restrictions: Yes RLE Weight Bearing: Weight bearing as tolerated      Mobility  Bed Mobility Overal bed mobility: Needs Assistance Bed Mobility: Sit to Supine;Supine to Sit     Supine to sit: Supervision Sit to supine: Supervision      Transfers Overall transfer level: Needs assistance Equipment used: Rolling walker (2 wheeled) Transfers: Sit to/from [facility] Sit to Stand: Supervision Stand pivot transfers: Supervision          Ambulation/Gait Ambulation/Gait assistance: Supervision Ambulation Distance (Feet): 25 Feet Assistive device:  Rolling walker (2 wheeled) Gait Pattern/deviations: Decreased step length - right;Decreased stride length;Decreased stance time - right   Gait velocity interpretation: Below normal speed for age/gender General Gait Details: Patient demonstrates slow labored cadence without loss of balance, uses RW like a pick up walker   Stairs            Wheelchair Mobility    Modified Rankin (Stroke Patients Only)       Balance Overall balance assessment: Needs assistance Sitting-balance support: No upper extremity supported;Feet supported Sitting balance-Leahy Scale: Good     Standing balance support: Bilateral upper extremity supported;During functional activity Standing balance-Leahy Scale: Fair                               Pertinent Vitals/Pain Pain Assessment: Faces Pain Score: 3  Pain Location: right shoulder and right hip Pain Descriptors / Indicators: Aching;Sharp Pain Intervention(s): Limited activity within patient's tolerance;Monitored during session;Premedicated before session    Home Living Family/patient expects to be discharged to:: Private residence Living Arrangements: Alone Available Help at Discharge: Family (lives close by) Type of Home: Apartment Home Access: Level entry     Home Layout: One level Home Equipment: Walker - standard;Bedside commode;Shower seat Additional Comments: Patient O2 dependent has concentrator at home    Prior Function Level of Independence: Independent with assistive device(s)               Hand Dominance   Dominant Hand: Right    Extremity/Trunk Assessment   Upper Extremity Assessment Upper Extremity Assessment: Overall WFL for tasks assessed    Lower Extremity Assessment Lower Extremity Assessment:  Overall WFL for tasks assessed;RLE deficits/detail RLE Deficits / Details: grossly -4/5    Cervical / Trunk Assessment Cervical / Trunk Assessment: Normal  Communication   Communication: No  difficulties  Cognition Arousal/Alertness: Awake/alert Behavior During Therapy: WFL for tasks assessed/performed Overall Cognitive Status: Within Functional Limits for tasks assessed                                        General Comments      Exercises     Assessment/Plan    PT Assessment Patient needs continued PT services  PT Problem List Decreased strength;Decreased activity tolerance;Decreased balance;Decreased mobility       PT Treatment Interventions      PT Goals (Current goals can be found in the Care Plan section)  Acute Rehab PT Goals Patient Stated Goal: return home with family to assist PT Goal Formulation: With patient Time For Goal Achievement: 07/26/17 Potential to Achieve Goals: Good    Frequency     Barriers to discharge        Co-evaluation               AM-PAC PT "6 Clicks" Daily Activity  Outcome Measure Difficulty turning over in bed (including adjusting bedclothes, sheets and blankets)?: A Little Difficulty moving from lying on back to sitting on the side of the bed? : A Little Difficulty sitting down on and standing up from a chair with arms (e.g., wheelchair, bedside commode, etc,.)?: A Little Help needed moving to and from a bed to chair (including a wheelchair)?: A Little Help needed walking in hospital room?: A Little Help needed climbing 3-5 steps with a railing? : A Lot 6 Click Score: 17    End of Session Equipment Utilized During Treatment: Gait belt Activity Tolerance: Patient tolerated treatment well;Patient limited by fatigue Patient left: in chair;with call bell/phone within reach Nurse Communication: Mobility status PT Visit Diagnosis: Unsteadiness on feet (R26.81);Other abnormalities of gait and mobility (R26.89);Muscle weakness (generalized) (M62.81)    Time: 8250-5397 PT Time Calculation (min) (ACUTE ONLY): 24 min   Charges:   PT Evaluation $PT Eval Low Complexity: 1 Low PT  Treatments $Therapeutic Activity: 23-37 mins   PT G Codes:   PT G-Codes **NOT FOR INPATIENT CLASS** Functional Assessment Tool Used: AM-PAC 6 Clicks Basic Mobility Functional Limitation: Mobility: Walking and moving around Mobility: Walking and Moving Around Current Status (Q7341): At least 40 percent but less than 60 percent impaired, limited or restricted Mobility: Walking and Moving Around Goal Status [phone]): At least 40 percent but less than 60 percent impaired, limited or restricted Mobility: Walking and Moving Around Discharge Status [phone]): At least 40 percent but less than 60 percent impaired, limited or restricted    2:09 PM, 07/14/17 Lonell Grandchild, MPT Physical Therapist with [facility] [phone] office [phone] mobile phone

## 2017-07-14 NOTE — NC FL2 (Signed)
[place] LEVEL OF CARE SCREENING TOOL     IDENTIFICATION  Patient Name: Lindsey Combs Birthdate: Jan 26, 1946 Sex: female Admission Date (Current Location): 07/13/2017  [facility] and [place] Number:  [employer] and Address:  [address] [address] [place]      Provider Number: [id number]  Attending Physician Name and Address:  Kathie Dike, MD  Relative Name and Phone Number:       Current Level of Care: Other (Comment) (Patient is currently in observation.) Recommended Level of Care: [place] Prior Approval Number:    Date Approved/Denied:   PASRR Number:    Discharge Plan: SNF    Current Diagnoses: Patient Active Problem List   Diagnosis Date Noted  . Closed right hip fracture ([place]) 07/13/2017  . Vomiting and diarrhea 07/13/2017  . Fall at home, initial encounter 07/13/2017  . Hip fracture ([place]) 07/13/2017  . COPD with acute exacerbation ([place]) 02/16/2017  . Tobacco abuse 02/16/2017  . Malnutrition of moderate degree 09/22/2016  . Hypomagnesemia 09/21/2016  . Tobacco use 08/03/2016  . Hyperlipidemia 08/03/2016  . Hypertension 08/03/2016  . History of Helicobacter pylori infection 08/03/2016  . History of anemia 08/03/2016  . Diverticulosis 08/03/2016  . Arthritis 08/03/2016  . 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 Chronic obstructive pulmonary disease with acute exacerbation ([place])   . Acute respiratory failure with hypoxia ([place]) 04/03/2015  . COPD exacerbation ([place]) 04/03/2015  . Hyponatremia 04/03/2015  . Dehydration 04/03/2015  . Hyperglycemia 04/03/2015  . Anxiety   . COPD (chronic obstructive pulmonary disease) ([place])   . Peripheral neuropathy ([place])   . Tobacco dependence   . Back pain, chronic 04/19/2013  . Abnormal weight loss 02/19/2013  . Anemia, iron deficiency 02/19/2013  . GERD (gastroesophageal reflux disease) 02/19/2013    Orientation RESPIRATION BLADDER Height &  Weight     Self, Time, Situation, Place  O2 (3L) Continent Weight: 97 lb 2 oz (44.1 kg) Height:  5' 3.5" (161.3 cm)  BEHAVIORAL SYMPTOMS/MOOD NEUROLOGICAL BOWEL NUTRITION STATUS      Continent Diet (see discharge summary)  AMBULATORY STATUS COMMUNICATION OF NEEDS Skin   Limited Assist Verbally Normal                       Personal Care Assistance Level of Assistance  Bathing, Feeding, Dressing Bathing Assistance: Limited assistance Feeding assistance: Independent Dressing Assistance: Limited assistance     Functional Limitations Info  Sight, Speech, Hearing Sight Info: Adequate Hearing Info: Adequate Speech Info: Adequate    SPECIAL CARE FACTORS FREQUENCY  PT (By licensed PT)     PT Frequency: 5x/week              Contractures Contractures Info: Not present    Additional Factors Info  Code Status, Allergies, Psychotropic Code Status Info: Full Code Allergies Info: Prednisone, Chantix, Codeine,  Psychotropic Info: Xanax, Lexapro         Current Medications (07/14/2017):  This is the current hospital active medication list Current Facility-Administered Medications  Medication Dose Route Frequency Provider Last Rate Last Dose  . 0.9 %  sodium chloride infusion   Intravenous Continuous Kathie Dike, MD 100 mL/hr at 07/13/17 1659    . 0.9 % NaCl with KCl 20 mEq/ L  infusion   Intravenous Continuous Kathie Dike, MD 100 mL/hr at 07/14/17 0936    . acetaminophen (TYLENOL) tablet 650 mg  650 mg Oral Q6H PRN Kathie Dike,  MD      . albuterol (PROVENTIL) (2.5 MG/3ML) 0.083% nebulizer solution 2.5 mg  2.5 mg Nebulization Q6H PRN Kathie Dike, MD   2.5 mg at 07/14/17 0515  . ALPRAZolam Duanne Moron) tablet 0.5 mg  0.5 mg Oral BID PRN Kathie Dike, MD   0.5 mg at 07/13/17 2205  . amLODipine (NORVASC) tablet 5 mg  5 mg Oral Daily Kathie Dike, MD   5 mg at 07/14/17 0920  . atorvastatin (LIPITOR) tablet 20 mg  20 mg Oral Daily Kathie Dike, MD   20 mg at  07/14/17 0921  . enoxaparin (LOVENOX) injection 30 mg  30 mg Subcutaneous Q24H Memon, Jolaine Artist, MD      . escitalopram (LEXAPRO) tablet 10 mg  10 mg Oral Daily Kathie Dike, MD   10 mg at 07/14/17 0920  . feeding supplement (ENSURE ENLIVE) (ENSURE ENLIVE) liquid 237 mL  237 mL Oral BID BM Memon, Jolaine Artist, MD      . gabapentin (NEURONTIN) capsule 100 mg  100 mg Oral Daily Kathie Dike, MD   100 mg at 07/14/17 0920  . guaiFENesin (MUCINEX) 12 hr tablet 600 mg  600 mg Oral BID Kathie Dike, MD   600 mg at 07/14/17 0921  . HYDROcodone-acetaminophen (NORCO/VICODIN) 5-325 MG per tablet 1-2 tablet  1-2 tablet Oral Q6H PRN Kathie Dike, MD   1 tablet at 07/14/17 1228  . Influenza vac split quadrivalent PF (FLUZONE HIGH-DOSE) injection 0.5 mL  0.5 mL Intramuscular Prior to discharge Kathie Dike, MD      . methocarbamol (ROBAXIN) tablet 500 mg  500 mg Oral TID Kathie Dike, MD   500 mg at 07/14/17 0920  . mometasone-formoterol (DULERA) 200-5 MCG/ACT inhaler 2 puff  2 puff Inhalation BID Kathie Dike, MD   2 puff at 07/14/17 1117  . morphine 2 MG/ML injection 0.5 mg  0.5 mg Intravenous Q2H PRN Kathie Dike, MD      . multivitamin with minerals tablet 1 tablet  1 tablet Oral Daily Memon, Jolaine Artist, MD      . ondansetron (ZOFRAN) injection 4 mg  4 mg Intravenous Q6H PRN Kathie Dike, MD      . pantoprazole (PROTONIX) EC tablet 40 mg  40 mg Oral Daily Kathie Dike, MD   40 mg at 07/14/17 0920  . tiotropium (SPIRIVA) inhalation capsule 18 mcg  18 mcg Inhalation Daily Kathie Dike, MD   18 mcg at 07/14/17 1118     Discharge Medications: Please see discharge summary for a list of discharge medications.  Relevant Imaging Results:  Relevant Lab Results:   Additional Information SSN [id number] Clydene Pugh, LCSW

## 2017-07-14 NOTE — Clinical Social Work Note (Signed)
Clinical Social Work Assessment  Patient Details  Name: Lindsey Combs MRN: [id number] Date of Birth: Dec 21, 1945  Date of referral:  07/13/17               Reason for consult:  Discharge Planning                Permission sought to share information with:    Permission granted to share information::     Name::        Agency::     Relationship::     Contact Information:     Housing/Transportation Living arrangements for the past 2 months:  Apartment Source of Information:  Patient Patient Interpreter Needed:  None Criminal Activity/Legal Involvement Pertinent to Current Situation/Hospitalization:    Significant Relationships:  Adult Children, Neighbor, [place] Lives with:  Self Do you feel safe going back to the place where you live?  Yes Need for family participation in patient care:  Yes (Comment)  Care giving concerns:  Patient lives alone, ambulates with a walker and is independent in her ADLs. Patient stated that she was in rehab at [facility] and was discharged on 03-07-17. She stated that [facility] stopped in July. Patient states that she is unsure about if she wants to return to SNF or HHPT. Patient stated that she really needs someone to come in to her home who can vacuum and do laundry.    Social Worker assessment / plan:  LCSW will follow up with patient to identify is she desires SNF following PT recommendation.   Employment status:  Retired Nurse, adult PT Recommendations:  Not assessed at this time Information / Referral to community resources:  [place]  Patient/Family's Response to care:  Patient is considering [place] vs. SNF.   Patient/Family's Understanding of and Emotional Response to Diagnosis, Current Treatment, and Prognosis:  Patient understands her diagnosis, treatment and prognosis.   Emotional Assessment Appearance:  Appears stated age Attitude/Demeanor/Rapport:    Affect (typically observed):  Accepting,  Calm Orientation:  Oriented to Situation, Oriented to  Time, Oriented to Place, Oriented to Self Alcohol / Substance use:  Not Applicable Psych involvement (Current and /or in the community):     Discharge Needs  Concerns to be addressed:  Discharge Planning Concerns Readmission within the last 30 days:  No Current discharge risk:  None Barriers to Discharge:  No Barriers Identified   Ihor Gully, LCSW 07/14/2017, 1:41 PM

## 2017-07-14 NOTE — Progress Notes (Signed)
Patient ID: Lindsey Combs, female   DOB: Apr 30, 1946, 71 y.o.   MRN: [id number] Asked to evaluate this patient for greater trochanteric fracture. X-ray was evaluated along the CAT scan. Is a greater trochanteric fracture nondisplaced. There is no extension into the femoral neck or trochanter or femur on the CT scan. I've advised to have the patient follow-up in my office. No formal consult was done. Follow-up can be in 2 weeks for x-rays.

## 2017-07-14 NOTE — Care Management Obs Status (Signed)
[place] NOTIFICATION   Patient Details  Name: LOY LITTLE MRN: [id number] Date of Birth: 01-22-1946   Medicare Observation Status Notification Given:  Yes    Mikalah Skyles, Chauncey Reading, RN 07/14/2017, 2:13 PM

## 2017-07-31 ENCOUNTER — Ambulatory Visit (INDEPENDENT_AMBULATORY_CARE_PROVIDER_SITE_OTHER): Payer: Self-pay | Admitting: Orthopedic Surgery

## 2017-07-31 ENCOUNTER — Encounter: Payer: Self-pay | Admitting: Orthopedic Surgery

## 2017-07-31 ENCOUNTER — Ambulatory Visit (INDEPENDENT_AMBULATORY_CARE_PROVIDER_SITE_OTHER): Payer: Medicare Other

## 2017-07-31 VITALS — BP 147/70 | HR 80 | Ht 66.0 in | Wt 96.0 lb

## 2017-07-31 DIAGNOSIS — S72114A Nondisplaced fracture of greater trochanter of right femur, initial encounter for closed fracture: Secondary | ICD-10-CM | POA: Diagnosis not present

## 2017-07-31 MED ORDER — HYDROCODONE-ACETAMINOPHEN 5-325 MG PO TABS: 1.0000 | ORAL_TABLET | Freq: Four times a day (QID) | ORAL | 0 refills | Status: DC | PRN

## 2017-07-31 NOTE — Patient Instructions (Signed)
Steps to Quit Smoking Smoking tobacco can be bad for your health. It can also affect almost every organ in your body. Smoking puts you and people around you at risk for many serious long-lasting (chronic) diseases. Quitting smoking is hard, but it is one of the best things that you can do for your health. It is never too late to quit. What are the benefits of quitting smoking? When you quit smoking, you lower your risk for getting serious diseases and conditions. They can include:  Lung cancer or lung disease.  Heart disease.  Stroke.  Heart attack.  Not being able to have children (infertility).  Weak bones (osteoporosis) and broken bones (fractures).  If you have coughing, wheezing, and shortness of breath, those symptoms may get better when you quit. You may also get sick less often. If you are pregnant, quitting smoking can help to lower your chances of having a baby of low birth weight. What can I do to help me quit smoking? Talk with your doctor about what can help you quit smoking. Some things you can do (strategies) include:  Quitting smoking totally, instead of slowly cutting back how much you smoke over a period of time.  Going to in-person counseling. You are more likely to quit if you go to many counseling sessions.  Using resources and support systems, such as: ? Online chats with a counselor. ? Phone quitlines. ? Printed self-help materials. ? Support groups or group counseling. ? Text messaging programs. ? Mobile phone apps or applications.  Taking medicines. Some of these medicines may have nicotine in them. If you are pregnant or breastfeeding, do not take any medicines to quit smoking unless your doctor says it is okay. Talk with your doctor about counseling or other things that can help you.  Talk with your doctor about using more than one strategy at the same time, such as taking medicines while you are also going to in-person counseling. This can help make  quitting easier. What things can I do to make it easier to quit? Quitting smoking might feel very hard at first, but there is a lot that you can do to make it easier. Take these steps:  Talk to your family and friends. Ask them to support and encourage you.  Call phone quitlines, reach out to support groups, or work with a counselor.  Ask people who smoke to not smoke around you.  Avoid places that make you want (trigger) to smoke, such as: ? Bars. ? Parties. ? Smoke-break areas at work.  Spend time with people who do not smoke.  Lower the stress in your life. Stress can make you want to smoke. Try these things to help your stress: ? Getting regular exercise. ? Deep-breathing exercises. ? Yoga. ? Meditating. ? Doing a body scan. To do this, close your eyes, focus on one area of your body at a time from head to toe, and notice which parts of your body are tense. Try to relax the muscles in those areas.  Download or buy apps on your mobile phone or tablet that can help you stick to your quit plan. There are many free apps, such as QuitGuide from the CDC (Centers for Disease Control and Prevention). You can find more support from smokefree.gov and other websites.  This information is not intended to replace advice given to you by your health care provider. Make sure you discuss any questions you have with your health care provider. Document Released: 07/09/2009 Document   Revised: 05/10/2016 Document Reviewed: 01/27/2015 Elsevier Interactive Patient Education  2018 Elsevier Inc.  

## 2017-07-31 NOTE — Progress Notes (Signed)
Chief Complaint  Patient presents with  . Hip Pain    right hip greater trochanter fracture 07/13/17     BP (!) 147/70   Pulse 80   Ht 5\' 6"  (1.676 m)   Wt 96 lb (43.5 kg)   BMI 15.49 kg/m   Encounter Diagnosis  Name Primary?  . Closed nondisplaced fracture of greater trochanter of right femur, initial encounter ([place]) 07/13/17 Yes   Nonoperative treatment for right greater trochanteric hip fracture.  She did have CT scan in the hospital showed no extension of the fracture into the trochanteric or subtrochanteric region  Range of motion in the injured hip which is a right hip is normal with some mild tenderness over the greater trochanter  The patient complains of generalized body aches and pains and she ran out of her hydrocodone that was given from the hospital  She is undergoing physical therapy and will follow up with [place] in 4 weeks after today's x-ray showed appropriate fracture healing  4-week follow-up x-ray

## 2017-08-28 ENCOUNTER — Ambulatory Visit: Payer: Medicare Other | Admitting: Orthopedic Surgery

## 2017-09-04 ENCOUNTER — Ambulatory Visit: Payer: Medicare Other | Admitting: Orthopedic Surgery

## 2017-09-06 DIAGNOSIS — J9801 Acute bronchospasm: Secondary | ICD-10-CM | POA: Diagnosis not present

## 2017-09-06 DIAGNOSIS — J441 Chronic obstructive pulmonary disease with (acute) exacerbation: Secondary | ICD-10-CM | POA: Diagnosis not present

## 2017-09-12 ENCOUNTER — Ambulatory Visit (INDEPENDENT_AMBULATORY_CARE_PROVIDER_SITE_OTHER): Payer: Medicare Other

## 2017-09-12 ENCOUNTER — Ambulatory Visit (INDEPENDENT_AMBULATORY_CARE_PROVIDER_SITE_OTHER): Payer: Self-pay | Admitting: Orthopedic Surgery

## 2017-09-12 ENCOUNTER — Encounter: Payer: Self-pay | Admitting: Orthopedic Surgery

## 2017-09-12 VITALS — BP 163/76 | HR 88 | Ht 66.0 in | Wt 85.0 lb

## 2017-09-12 DIAGNOSIS — S72001D Fracture of unspecified part of neck of right femur, subsequent encounter for closed fracture with routine healing: Secondary | ICD-10-CM

## 2017-09-12 DIAGNOSIS — S72114D Nondisplaced fracture of greater trochanter of right femur, subsequent encounter for closed fracture with routine healing: Secondary | ICD-10-CM

## 2017-09-12 NOTE — Progress Notes (Signed)
Follow-up visit for x-rays of the right greater trochanteric fracture  Chief Complaint  Patient presents with  . Follow-up    Right HIP GREATER TROCHANTER FRACTURE DOI 07/13/17   X-ray greater trochanteric fracture With slight separation  Integrity of the hip is normal  Normal hip flexion no tenderness ambulates with a cane  Follow-up as needed  Encounter Diagnoses  Name Primary?  . Closed fracture of right hip with routine healing, subsequent encounter 07/13/17 Yes  . Closed nondisplaced fracture of greater trochanter of right femur with routine healing, subsequent encounter

## 2017-09-12 NOTE — Patient Instructions (Signed)
Steps to Quit Smoking Smoking tobacco can be bad for your health. It can also affect almost every organ in your body. Smoking puts you and people around you at risk for many serious Lera Gaines-lasting (chronic) diseases. Quitting smoking is hard, but it is one of the best things that you can do for your health. It is never too late to quit. What are the benefits of quitting smoking? When you quit smoking, you lower your risk for getting serious diseases and conditions. They can include:  Lung cancer or lung disease.  Heart disease.  Stroke.  Heart attack.  Not being able to have children (infertility).  Weak bones (osteoporosis) and broken bones (fractures).  If you have coughing, wheezing, and shortness of breath, those symptoms may get better when you quit. You may also get sick less often. If you are pregnant, quitting smoking can help to lower your chances of having a baby of low birth weight. What can I do to help me quit smoking? Talk with your doctor about what can help you quit smoking. Some things you can do (strategies) include:  Quitting smoking totally, instead of slowly cutting back how much you smoke over a period of time.  Going to in-person counseling. You are more likely to quit if you go to many counseling sessions.  Using resources and support systems, such as: ? Online chats with a counselor. ? Phone quitlines. ? Printed self-help materials. ? Support groups or group counseling. ? Text messaging programs. ? Mobile phone apps or applications.  Taking medicines. Some of these medicines may have nicotine in them. If you are pregnant or breastfeeding, do not take any medicines to quit smoking unless your doctor says it is okay. Talk with your doctor about counseling or other things that can help you.  Talk with your doctor about using more than one strategy at the same time, such as taking medicines while you are also going to in-person counseling. This can help make  quitting easier. What things can I do to make it easier to quit? Quitting smoking might feel very hard at first, but there is a lot that you can do to make it easier. Take these steps:  Talk to your family and friends. Ask them to support and encourage you.  Call phone quitlines, reach out to support groups, or work with a counselor.  Ask people who smoke to not smoke around you.  Avoid places that make you want (trigger) to smoke, such as: ? Bars. ? Parties. ? Smoke-break areas at work.  Spend time with people who do not smoke.  Lower the stress in your life. Stress can make you want to smoke. Try these things to help your stress: ? Getting regular exercise. ? Deep-breathing exercises. ? Yoga. ? Meditating. ? Doing a body scan. To do this, close your eyes, focus on one area of your body at a time from head to toe, and notice which parts of your body are tense. Try to relax the muscles in those areas.  Download or buy apps on your mobile phone or tablet that can help you stick to your quit plan. There are many free apps, such as QuitGuide from the CDC (Centers for Disease Control and Prevention). You can find more support from smokefree.gov and other websites.  This information is not intended to replace advice given to you by your health care provider. Make sure you discuss any questions you have with your health care provider. Document Released: 07/09/2009 Document   Revised: 05/10/2016 Document Reviewed: 01/27/2015 Elsevier Interactive Patient Education  2018 Elsevier Inc.  

## 2017-10-07 DIAGNOSIS — J9801 Acute bronchospasm: Secondary | ICD-10-CM | POA: Diagnosis not present

## 2017-10-07 DIAGNOSIS — J441 Chronic obstructive pulmonary disease with (acute) exacerbation: Secondary | ICD-10-CM | POA: Diagnosis not present

## 2017-10-13 DIAGNOSIS — I10 Essential (primary) hypertension: Secondary | ICD-10-CM | POA: Diagnosis not present

## 2017-10-13 DIAGNOSIS — E871 Hypo-osmolality and hyponatremia: Secondary | ICD-10-CM | POA: Diagnosis not present

## 2017-10-13 DIAGNOSIS — E785 Hyperlipidemia, unspecified: Secondary | ICD-10-CM | POA: Diagnosis not present

## 2017-10-17 DIAGNOSIS — Z79899 Other long term (current) drug therapy: Secondary | ICD-10-CM | POA: Diagnosis not present

## 2017-10-17 DIAGNOSIS — R739 Hyperglycemia, unspecified: Secondary | ICD-10-CM | POA: Diagnosis not present

## 2017-10-17 DIAGNOSIS — Z5181 Encounter for therapeutic drug level monitoring: Secondary | ICD-10-CM | POA: Diagnosis not present

## 2017-11-07 DIAGNOSIS — J9801 Acute bronchospasm: Secondary | ICD-10-CM | POA: Diagnosis not present

## 2017-11-07 DIAGNOSIS — J441 Chronic obstructive pulmonary disease with (acute) exacerbation: Secondary | ICD-10-CM | POA: Diagnosis not present

## 2017-11-14 DIAGNOSIS — J449 Chronic obstructive pulmonary disease, unspecified: Secondary | ICD-10-CM | POA: Diagnosis not present

## 2017-11-14 DIAGNOSIS — Z79899 Other long term (current) drug therapy: Secondary | ICD-10-CM | POA: Diagnosis not present

## 2017-11-14 DIAGNOSIS — Z5181 Encounter for therapeutic drug level monitoring: Secondary | ICD-10-CM | POA: Diagnosis not present

## 2017-11-14 DIAGNOSIS — K219 Gastro-esophageal reflux disease without esophagitis: Secondary | ICD-10-CM | POA: Diagnosis not present

## 2017-11-28 DIAGNOSIS — J449 Chronic obstructive pulmonary disease, unspecified: Secondary | ICD-10-CM | POA: Diagnosis not present

## 2017-12-05 DIAGNOSIS — J441 Chronic obstructive pulmonary disease with (acute) exacerbation: Secondary | ICD-10-CM | POA: Diagnosis not present

## 2017-12-05 DIAGNOSIS — J9801 Acute bronchospasm: Secondary | ICD-10-CM | POA: Diagnosis not present

## 2017-12-12 ENCOUNTER — Other Ambulatory Visit: Payer: Self-pay

## 2017-12-12 ENCOUNTER — Inpatient Hospital Stay (HOSPITAL_COMMUNITY): Payer: Medicare Other | Attending: Internal Medicine | Admitting: Internal Medicine

## 2017-12-12 ENCOUNTER — Encounter (HOSPITAL_COMMUNITY): Payer: Self-pay | Admitting: Internal Medicine

## 2017-12-12 VITALS — BP 159/54 | HR 89 | Temp 98.3°F | Resp 20 | Wt 83.6 lb

## 2017-12-12 DIAGNOSIS — Z862 Personal history of diseases of the blood and blood-forming organs and certain disorders involving the immune mechanism: Secondary | ICD-10-CM | POA: Diagnosis not present

## 2017-12-12 DIAGNOSIS — I10 Essential (primary) hypertension: Secondary | ICD-10-CM | POA: Insufficient documentation

## 2017-12-12 DIAGNOSIS — K219 Gastro-esophageal reflux disease without esophagitis: Secondary | ICD-10-CM | POA: Insufficient documentation

## 2017-12-12 DIAGNOSIS — G629 Polyneuropathy, unspecified: Secondary | ICD-10-CM | POA: Diagnosis not present

## 2017-12-12 DIAGNOSIS — F419 Anxiety disorder, unspecified: Secondary | ICD-10-CM | POA: Diagnosis not present

## 2017-12-12 DIAGNOSIS — J441 Chronic obstructive pulmonary disease with (acute) exacerbation: Secondary | ICD-10-CM | POA: Diagnosis not present

## 2017-12-12 DIAGNOSIS — D509 Iron deficiency anemia, unspecified: Secondary | ICD-10-CM | POA: Insufficient documentation

## 2017-12-12 DIAGNOSIS — M549 Dorsalgia, unspecified: Secondary | ICD-10-CM | POA: Diagnosis not present

## 2017-12-12 DIAGNOSIS — J449 Chronic obstructive pulmonary disease, unspecified: Secondary | ICD-10-CM | POA: Diagnosis not present

## 2017-12-12 DIAGNOSIS — D508 Other iron deficiency anemias: Secondary | ICD-10-CM

## 2017-12-12 DIAGNOSIS — E440 Moderate protein-calorie malnutrition: Secondary | ICD-10-CM

## 2017-12-12 DIAGNOSIS — Z79899 Other long term (current) drug therapy: Secondary | ICD-10-CM | POA: Diagnosis not present

## 2017-12-12 DIAGNOSIS — E871 Hypo-osmolality and hyponatremia: Secondary | ICD-10-CM | POA: Insufficient documentation

## 2017-12-12 DIAGNOSIS — C052 Malignant neoplasm of uvula: Secondary | ICD-10-CM

## 2017-12-12 DIAGNOSIS — Z8619 Personal history of other infectious and parasitic diseases: Secondary | ICD-10-CM | POA: Diagnosis not present

## 2017-12-12 DIAGNOSIS — F17210 Nicotine dependence, cigarettes, uncomplicated: Secondary | ICD-10-CM

## 2017-12-12 DIAGNOSIS — Z170 Estrogen receptor positive status [ER+]: Secondary | ICD-10-CM | POA: Diagnosis not present

## 2017-12-12 DIAGNOSIS — M5136 Other intervertebral disc degeneration, lumbar region: Secondary | ICD-10-CM | POA: Insufficient documentation

## 2017-12-12 DIAGNOSIS — R630 Anorexia: Secondary | ICD-10-CM | POA: Diagnosis not present

## 2017-12-12 DIAGNOSIS — R634 Abnormal weight loss: Secondary | ICD-10-CM

## 2017-12-12 DIAGNOSIS — R5383 Other fatigue: Secondary | ICD-10-CM | POA: Insufficient documentation

## 2017-12-12 DIAGNOSIS — C50312 Malignant neoplasm of lower-inner quadrant of left female breast: Secondary | ICD-10-CM | POA: Insufficient documentation

## 2017-12-12 LAB — CBC WITH DIFFERENTIAL/PLATELET
Basophils Absolute: 0.1 10*3/uL (ref 0.0–0.1)
Basophils Relative: 1 %
Eosinophils Absolute: 0.1 10*3/uL (ref 0.0–0.7)
Eosinophils Relative: 1 %
HEMATOCRIT: 33.6 % — AB (ref 36.0–46.0)
HEMOGLOBIN: 10.7 g/dL — AB (ref 12.0–15.0)
LYMPHS ABS: 2.3 10*3/uL (ref 0.7–4.0)
LYMPHS PCT: 21 %
MCH: 29.5 pg (ref 26.0–34.0)
MCHC: 31.8 g/dL (ref 30.0–36.0)
MCV: 92.6 fL (ref 78.0–100.0)
MONOS PCT: 5 %
Monocytes Absolute: 0.5 10*3/uL (ref 0.1–1.0)
NEUTROS PCT: 72 %
Neutro Abs: 7.6 10*3/uL (ref 1.7–7.7)
Platelets: 324 10*3/uL (ref 150–400)
RBC: 3.63 MIL/uL — ABNORMAL LOW (ref 3.87–5.11)
RDW: 14.9 % (ref 11.5–15.5)
WBC: 10.5 10*3/uL (ref 4.0–10.5)

## 2017-12-12 LAB — VITAMIN B12: Vitamin B-12: 590 pg/mL (ref 180–914)

## 2017-12-12 LAB — COMPREHENSIVE METABOLIC PANEL
ALT: 16 U/L (ref 14–54)
AST: 19 U/L (ref 15–41)
Albumin: 4.1 g/dL (ref 3.5–5.0)
Alkaline Phosphatase: 58 U/L (ref 38–126)
Anion gap: 12 (ref 5–15)
BUN: 6 mg/dL (ref 6–20)
CHLORIDE: 92 mmol/L — AB (ref 101–111)
CO2: 29 mmol/L (ref 22–32)
CREATININE: 0.52 mg/dL (ref 0.44–1.00)
Calcium: 9.8 mg/dL (ref 8.9–10.3)
GFR calc Af Amer: >60 mL/min (ref >60)
GFR calc non Af Amer: >60 mL/min (ref >60)
Glucose, Bld: 86 mg/dL (ref 65–99)
Potassium: 4 mmol/L (ref 3.5–5.1)
SODIUM: 133 mmol/L — AB (ref 135–145)
Total Bilirubin: 0.8 mg/dL (ref 0.3–1.2)
Total Protein: 7 g/dL (ref 6.5–8.1)

## 2017-12-12 LAB — FERRITIN: Ferritin: 27 ng/mL (ref 11–307)

## 2017-12-12 LAB — LACTATE DEHYDROGENASE: LDH: 132 U/L (ref 98–192)

## 2017-12-12 NOTE — Progress Notes (Signed)
Diagnosis Malignant neoplasm of lower-inner quadrant of left breast in female, estrogen receptor positive ([place]) - Plan: NM PET Image Restag (PS) Skull Base To Thigh, CBC with Differential/Platelet, Comprehensive metabolic panel, Lactate dehydrogenase, Ferritin, Protein electrophoresis, serum, Vitamin B12, Vitamin B12, Protein electrophoresis, serum, Ferritin, Lactate dehydrogenase, Comprehensive metabolic panel, CBC with Differential/Platelet, CANCELED: DG Bone Density, CANCELED: MM Digital Diagnostic Bilat  Other iron deficiency anemia - Plan: CBC with Differential/Platelet, Comprehensive metabolic panel, Lactate dehydrogenase, Ferritin, Protein electrophoresis, serum, Vitamin B12, Vitamin B12, Protein electrophoresis, serum, Ferritin, Lactate dehydrogenase, Comprehensive metabolic panel, CBC with Differential/Platelet  Staging Cancer Staging No matching staging information was found for the patient.  CHIEF COMPLAINT Squamous cell carcinoma of the uvula, suspicious for invasion Tobacco Abuse  Assessment and plan:  1.  Squamous cell carcinoma of the uvula, suspicious for invasion.  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  She was followed by Dr. Talbert Cage.  She continues to smoke.  He is complaining of weight loss, fatigue, anorexia.  Based on her history and ongoing smoking she will be set up for Pet imaging or CT neck, chest, abdomen and pelvis for further evaluation.  She will return to clinic in 2 weeks to go over the results.  2.  Anorexia and weight loss.  She weighs 84 pounds.  She continues to smoke.  She will be set up for PET scan or CT neck, chest, abdomen, pelvis for further evaluation.  Based on head and neck cancer history may have to get dietary evaluation for patient.  3.  Anemia.  Hemoglobin is 10.  Will check ferritin, SPEP, B12.  She will follow-up to go over the results.  4.  Smoking.  Cessation is recommended.  Interval History:  72 y.o. female  followed for squamous cell carcinoma of the uvula, suspicious for invasion.  She was followed by Dr. Talbert Cage and was seen for evaluation due to severe sore throat which persisted through antibiotics. She underwent a laryngoscopy on 06/08/2016 with Dr. Benjamine Mola showing severe posterior laryngeal edema. 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 She then underwent a direct laryngoscopy and biopsy of her uvula on 06/08/2016 with Dr. Benjamine Mola. Pathology of the uvula revealed squamous cell carcinoma suspicious for invasion.  She was hospitalized from 02/16/2017 through 02/23/2017 for COPD exacerbation. Since then she's been in rehabilitation which she was recently discharged from. Patient states that she continues to have congestion in her chest and has been coughing up white phlegm. She denies any associated fevers or chills. She denies any chest pain. She has not seen Dr. Benjamine Mola in the past year and is overdue for follow-up visit. She continues to smoke daily and states she is not smoking much but patient is very malodorous today from her cigarette smoke.  Current Status: Patient is seen today for follow-up.  She is complaining of fatigue, anorexia, weight loss.  She continues to smoke.  She is unsure when she has last seen Dr. Benjamine Mola.   Problem List Patient Active Problem List   Diagnosis Date Noted  . Closed right hip fracture ([place]) greater Trochanter 07/13/17 [S72.001A] 07/13/2017  . Vomiting and diarrhea [R11.10, R19.7] 07/13/2017  . Fall at home, initial encounter [W19.Merril Abbe, D74.128] 07/13/2017  . Hip fracture ([place]) [S72.009A] 07/13/2017  . COPD with acute exacerbation ([place]) [J44.1] 02/16/2017  . Tobacco abuse [Z72.0] 02/16/2017  . Malnutrition of moderate degree [E44.0] 09/22/2016  . Hypomagnesemia [  E83.42] 09/21/2016  . Tobacco use [Z72.0] 08/03/2016  . Hyperlipidemia [E78.5] 08/03/2016  . Hypertension [I10]  08/03/2016  . History of Helicobacter pylori infection [Z86.19] 08/03/2016  . History of anemia [Z86.2] 08/03/2016  . Diverticulosis [K57.90] 08/03/2016  . Arthritis [M19.90] 08/03/2016  . Carcinoma of uvula ([place]) [C05.2] 07/13/2016  . Chronic obstructive pulmonary disease with acute exacerbation ([place]) [J44.1]   . Acute respiratory failure with hypoxia ([place]) [J96.01] 04/03/2015  . COPD exacerbation ([place]) [J44.1] 04/03/2015  . Hyponatremia [E87.1] 04/03/2015  . Dehydration [E86.0] 04/03/2015  . Hyperglycemia [R73.9] 04/03/2015  . Anxiety [F41.9]   . COPD (chronic obstructive pulmonary disease) ([place]) [J44.9]   . Peripheral neuropathy ([place]) [G62.9]   . Tobacco dependence [F17.200]   . Back pain, chronic [M54.9, G89.29] 04/19/2013  . Abnormal weight loss [R63.4] 02/19/2013  . Anemia, iron deficiency [D50.9] 02/19/2013  . GERD (gastroesophageal reflux disease) [K21.9] 02/19/2013    Past Medical History Past Medical History:  Diagnosis Date  . Acute respiratory failure with hypoxia ([place])   . Anxiety   . Cancer (HCC)    uvular per pt  . COPD (chronic obstructive pulmonary disease) ([place])   . DDD (degenerative disc disease), lumbar   . Degenerative joint disease of spine   . Diverticulosis   . GERD (gastroesophageal reflux disease)   . H. pylori infection    treated 02/2013.  [name] History of UTI   . Hypercholesterolemia   . Hypertension   . Hyponatremia   . Peripheral neuropathy   . Shortness of breath    with exertion  . Tobacco abuse     Past Surgical History Past Surgical History:  Procedure Laterality Date  . ABDOMINAL EXPLORATION SURGERY     age 89, large ovarian cyst  . BIOPSY N/A 03/01/2013   Y[facility]:WCBJ hiatal hernia; otherwise, normal examination/ Status post biopsies as described above. SB bx negative for Celiac. +h/pylori  . CATARACT EXTRACTION W/ INTRAOCULAR LENS  IMPLANT, BILATERAL    . COLONOSCOPY  10/16/09   Jenkins:3 polypsin the sigmoid colon/polyps in the  descending colon and the rectum/scattered diverticulum. hyperplastic  . COLONOSCOPY WITH ESOPHAGOGASTRODUODENOSCOPY (EGD) N/A 03/01/2013   SEG:BTDVVOHY colonic polyps - treated/removed as described above. Colonic diverticulosis. Hyperplastic. Next TCS 02/2018.  [name] DIRECT LARYNGOSCOPY N/A 06/08/2016   Procedure: DIRECT LARYNGOSCOPY AND BIOPSY;  Surgeon: Leta Baptist, MD;  Location: [facility] OR;  Service: ENT;  Laterality: N/A;  . PARTIAL HYSTERECTOMY     age 41  . UVULECTOMY      Family History Family History  Problem Relation Age of Onset  . Leukemia Father   . Thyroid disease Father   . Stomach cancer Paternal Grandfather   . Colon cancer Neg Hx      Social History  reports that she has been smoking cigarettes.  She has a 75.00 pack-year smoking history. she has never used smokeless tobacco. She reports that she drinks alcohol. She reports that she does not use drugs.  Medications  Current Outpatient Medications:  .  acetaminophen (TYLENOL) 500 MG tablet, Take 500 mg by mouth every 6 (six) hours as needed for mild pain., Disp: , Rfl:  .  albuterol (PROAIR HFA) 108 (90 BASE) MCG/ACT inhaler, Inhale 2 puffs into the lungs every 4 (four) hours as needed for wheezing or shortness of breath., Disp: 1 Inhaler, Rfl: 1 .  ALPRAZolam (XANAX) 0.5 MG tablet, Take 1 tablet (0.5 mg total) by mouth 2 (two) times daily as needed for anxiety., Disp: 15 tablet, Rfl: 0 .  amLODipine (NORVASC) 5 MG tablet, TAKE 1 TABLET BY MOUTH ONCE A DAY., Disp: 30 tablet, Rfl: 1 .  atorvastatin (LIPITOR) 20 MG tablet, Take 20 mg by mouth daily., Disp: , Rfl:  .  escitalopram (LEXAPRO) 10 MG tablet, Take 10 mg by mouth daily. , Disp: , Rfl:  .  gabapentin (NEURONTIN) 100 MG capsule, Take 1 capsule by mouth daily., Disp: , Rfl:  .  HYDROcodone-acetaminophen (NORCO) 5-325 MG tablet, Take 1 tablet every 6 (six) hours as needed by mouth for moderate pain., Disp: 30 tablet, Rfl: 0 .  methocarbamol (ROBAXIN) 500 MG tablet, Take 1 tablet  (500 mg total) by mouth every 8 (eight) hours as needed for muscle spasms., Disp: 60 tablet, Rfl: 0 .  omeprazole (PRILOSEC) 20 MG capsule, Take 1 capsule by mouth daily., Disp: , Rfl:  .  SYMBICORT 160-4.5 MCG/ACT inhaler, Inhale 2 puffs into the lungs 2 (two) times daily., Disp: , Rfl:  .  tiotropium (SPIRIVA) 18 MCG inhalation capsule, Place 18 mcg into inhaler and inhale daily as needed. Shortness of breath, Disp: , Rfl:   Allergies Prednisone; Chantix [varenicline]; and Codeine  Review of Systems Review of Systems - Oncology ROS as per HPI otherwise 12 point ROS is negative other than weight loss, anorexia, fatigue  Physical Exam  Vitals Wt Readings from Last 3 Encounters:  12/12/17 83 lb 9.6 oz (37.9 kg)  09/12/17 85 lb (38.6 kg)  07/31/17 96 lb (43.5 kg)   Temp Readings from Last 3 Encounters:  12/12/17 98.3 F (36.8 C) (Oral)  07/14/17 98.2 F (36.8 C) (Oral)  06/14/17 98.2 F (36.8 C) (Oral)   BP Readings from Last 3 Encounters:  12/12/17 (!) 159/54  09/12/17 (!) 163/76  07/31/17 (!) 147/70   Pulse Readings from Last 3 Encounters:  12/12/17 89  09/12/17 88  07/31/17 80   Constitutional: Well-developed, well-nourished, and in no distress.   HENT: Head: Normocephalic and atraumatic.  Mouth/Throat: No oropharyngeal exudate. Mucosa moist.  Evidence of uvelectomy. No evidence of oral lesions. Eyes: Pupils are equal, round, and reactive to light. Conjunctivae are normal. No scleral icterus.  Neck: Normal range of motion. Neck supple. No JVD present.  Cardiovascular: Normal rate, regular rhythm and normal heart sounds.  Exam reveals no gallop and no friction rub.   No murmur heard. Pulmonary/Chest: Effort normal and breath sounds normal. No respiratory distress. No wheezes.No rales.  Abdominal: Soft. Bowel sounds are normal. No distension. There is no tenderness. There is no guarding.  Musculoskeletal: No edema or tenderness.  Lymphadenopathy: No cervical, axillary   or supraclavicular adenopathy.  Neurological: Alert and oriented to person, place, and time. No cranial nerve deficit.  Skin: Skin is warm and dry. No rash noted. No erythema. No pallor.  Psychiatric: Affect and judgment normal.   Labs Office Visit on 12/12/2017  Component Date Value Ref Range Status  . WBC 12/12/2017 10.5  4.0 - 10.5 K/uL Final  . RBC 12/12/2017 3.63* 3.87 - 5.11 MIL/uL Final  . Hemoglobin 12/12/2017 10.7* 12.0 - 15.0 g/dL Final  . HCT 12/12/2017 33.6* 36.0 - 46.0 % Final  . MCV 12/12/2017 92.6  78.0 - 100.0 fL Final  . MCH 12/12/2017 29.5  26.0 - 34.0 pg Final  . MCHC 12/12/2017 31.8  30.0 - 36.0 g/dL Final  . RDW 12/12/2017 14.9  11.5 - 15.5 % Final  . Platelets 12/12/2017 324  150 - 400 K/uL Final  . Neutrophils Relative % 12/12/2017 72  % Final  .  Neutro Abs 12/12/2017 7.6  1.7 - 7.7 K/uL Final  . Lymphocytes Relative 12/12/2017 21  % Final  . Lymphs Abs 12/12/2017 2.3  0.7 - 4.0 K/uL Final  . Monocytes Relative 12/12/2017 5  % Final  . Monocytes Absolute 12/12/2017 0.5  0.1 - 1.0 K/uL Final  . Eosinophils Relative 12/12/2017 1  % Final  . Eosinophils Absolute 12/12/2017 0.1  0.0 - 0.7 K/uL Final  . Basophils Relative 12/12/2017 1  % Final  . Basophils Absolute 12/12/2017 0.1  0.0 - 0.1 K/uL Final   Performed at [facility], [address], [place], [place] [address]     Pathology Orders Placed This Encounter  Procedures  . NM PET Image Restag (PS) Skull Base To Thigh    Standing Status:   Future    Standing Expiration Date:   12/12/2018    Order Specific Question:   If indicated for the ordered procedure, I authorize the administration of a radiopharmaceutical per Radiology protocol    Answer:   Yes    Order Specific Question:   Preferred imaging location?    Answer:   [facility]    Order Specific Question:   Radiology Contrast Protocol - do NOT remove file path    Answer:   [email or web address]    Order Specific  Question:   Reason for Exam additional comments    Answer:   Pt reports weight loss, she also continues to smoke  . CBC with Differential/Platelet    Standing Status:   Future    Number of Occurrences:   1    Standing Expiration Date:   12/13/2018  . Comprehensive metabolic panel    Standing Status:   Future    Number of Occurrences:   1    Standing Expiration Date:   12/13/2018  . Lactate dehydrogenase    Standing Status:   Future    Number of Occurrences:   1    Standing Expiration Date:   12/13/2018  . Ferritin    Standing Status:   Future    Number of Occurrences:   1    Standing Expiration Date:   12/13/2018  . Protein electrophoresis, serum    Standing Status:   Future    Number of Occurrences:   1    Standing Expiration Date:   12/13/2018  . Vitamin B12    Standing Status:   Future    Number of Occurrences:   1    Standing Expiration Date:   12/12/2018       Zoila Shutter MD

## 2017-12-12 NOTE — Patient Instructions (Addendum)
[place] at [facility] Discharge Instructions  Today you saw Dr. Zoila Shutter We are going to do blood work today We will schedule you for PET scans  Follow up after PET scans to review results   Thank you for choosing [place] at [facility] to provide your oncology and hematology care.  To afford each patient quality time with our provider, please arrive at least 15 minutes before your scheduled appointment time.   If you have a lab appointment with the [facility] please come in thru the  Main Entrance and check in at the main information desk  You need to re-schedule your appointment should you arrive 10 or more minutes late.  We strive to give you quality time with our providers, and arriving late affects you and other patients whose appointments are after yours.  Also, if you no show three or more times for appointments you may be dismissed from the clinic at the providers discretion.     Again, thank you for choosing [facility].  Our hope is that these requests will decrease the amount of time that you wait before being seen by our physicians.       _____________________________________________________________  Should you have questions after your visit to [facility], please contact our office at [phone] between the hours of 8:30 a.m. and 4:30 p.m.  Voicemails left after 4:30 p.m. will not be returned until the following business day.  For prescription refill requests, have your pharmacy contact our office.       Resources For Cancer Patients and their Caregivers ? American Cancer Society: Can assist with transportation, wigs, general needs, runs Look Good Feel Better.        339-579-0905 ? Cancer Care: Provides financial assistance, online support groups, medication/co-pay assistance.  1-800-813-HOPE 407-491-1001) ? [place] Assists [place] cancer patients and their  families through emotional , educational and financial support.  5182658190 ? [place] DSS Where to apply for food stamps, Medicaid and utility assistance. (819)117-4411 ? [employer]: Transportation to medical appointments. (425)423-3131 ? Social Security Administration: May apply for disability if have a Stage IV cancer. 571-752-1164 (501)394-1110 ? [employer], Disability and Transit Services: Assists with nutrition, care and transit needs. [place] Support Programs:   > Cancer Support Group  2nd Tuesday of the month 1pm-2pm, Journey Room   > Creative Journey  3rd Tuesday of the month 1130am-1pm, Journey Room

## 2017-12-13 LAB — PROTEIN ELECTROPHORESIS, SERUM
A/G RATIO SPE: 1.4 (ref 0.7–1.7)
ALBUMIN ELP: 3.8 g/dL (ref 2.9–4.4)
ALPHA-1-GLOBULIN: 0.3 g/dL (ref 0.0–0.4)
ALPHA-2-GLOBULIN: 0.7 g/dL (ref 0.4–1.0)
BETA GLOBULIN: 1 g/dL (ref 0.7–1.3)
Gamma Globulin: 0.7 g/dL (ref 0.4–1.8)
Globulin, Total: 2.8 g/dL (ref 2.2–3.9)
Total Protein ELP: 6.6 g/dL (ref 6.0–8.5)

## 2017-12-25 ENCOUNTER — Encounter (HOSPITAL_COMMUNITY)
Admission: RE | Admit: 2017-12-25 | Discharge: 2017-12-25 | Disposition: A | Discharge status: Home / Self Care | Payer: Medicare Other | Source: Ambulatory Visit | Attending: Internal Medicine | Admitting: Internal Medicine

## 2017-12-25 DIAGNOSIS — C50312 Malignant neoplasm of lower-inner quadrant of left female breast: Secondary | ICD-10-CM

## 2017-12-25 DIAGNOSIS — C50919 Malignant neoplasm of unspecified site of unspecified female breast: Secondary | ICD-10-CM | POA: Diagnosis not present

## 2017-12-25 DIAGNOSIS — Z170 Estrogen receptor positive status [ER+]: Secondary | ICD-10-CM | POA: Diagnosis not present

## 2017-12-25 MED ORDER — FLUDEOXYGLUCOSE F - 18 (FDG) INJECTION: 10.0000 | Freq: Once | INTRAVENOUS | Status: DC

## 2017-12-27 ENCOUNTER — Encounter (HOSPITAL_COMMUNITY): Payer: Self-pay | Admitting: Internal Medicine

## 2017-12-27 ENCOUNTER — Other Ambulatory Visit: Payer: Self-pay

## 2017-12-27 ENCOUNTER — Inpatient Hospital Stay (HOSPITAL_COMMUNITY): Payer: Medicare Other | Attending: Internal Medicine | Admitting: Internal Medicine

## 2017-12-27 VITALS — BP 116/54 | HR 78 | Temp 98.6°F | Resp 18 | Wt 82.5 lb

## 2017-12-27 DIAGNOSIS — K219 Gastro-esophageal reflux disease without esophagitis: Secondary | ICD-10-CM | POA: Diagnosis not present

## 2017-12-27 DIAGNOSIS — I10 Essential (primary) hypertension: Secondary | ICD-10-CM

## 2017-12-27 DIAGNOSIS — E860 Dehydration: Secondary | ICD-10-CM | POA: Diagnosis not present

## 2017-12-27 DIAGNOSIS — R630 Anorexia: Secondary | ICD-10-CM | POA: Insufficient documentation

## 2017-12-27 DIAGNOSIS — R739 Hyperglycemia, unspecified: Secondary | ICD-10-CM | POA: Diagnosis not present

## 2017-12-27 DIAGNOSIS — G629 Polyneuropathy, unspecified: Secondary | ICD-10-CM | POA: Insufficient documentation

## 2017-12-27 DIAGNOSIS — F17210 Nicotine dependence, cigarettes, uncomplicated: Secondary | ICD-10-CM | POA: Diagnosis not present

## 2017-12-27 DIAGNOSIS — F419 Anxiety disorder, unspecified: Secondary | ICD-10-CM

## 2017-12-27 DIAGNOSIS — R634 Abnormal weight loss: Secondary | ICD-10-CM | POA: Insufficient documentation

## 2017-12-27 DIAGNOSIS — C052 Malignant neoplasm of uvula: Secondary | ICD-10-CM | POA: Insufficient documentation

## 2017-12-27 DIAGNOSIS — D509 Iron deficiency anemia, unspecified: Secondary | ICD-10-CM | POA: Diagnosis not present

## 2017-12-27 DIAGNOSIS — R911 Solitary pulmonary nodule: Secondary | ICD-10-CM | POA: Insufficient documentation

## 2017-12-27 DIAGNOSIS — E871 Hypo-osmolality and hyponatremia: Secondary | ICD-10-CM

## 2017-12-27 DIAGNOSIS — J441 Chronic obstructive pulmonary disease with (acute) exacerbation: Secondary | ICD-10-CM | POA: Insufficient documentation

## 2017-12-27 DIAGNOSIS — Z806 Family history of leukemia: Secondary | ICD-10-CM | POA: Diagnosis not present

## 2017-12-27 DIAGNOSIS — Z79899 Other long term (current) drug therapy: Secondary | ICD-10-CM

## 2017-12-27 NOTE — Patient Instructions (Signed)
[place] at [facility] Discharge Instructions  Seen by Dr. Walden Field today Follow up as scheduled.  Thank you for choosing [place] at [facility] to provide your oncology and hematology care.  To afford each patient quality time with our provider, please arrive at least 15 minutes before your scheduled appointment time.   If you have a lab appointment with the [facility] please come in thru the  Main Entrance and check in at the main information desk  You need to re-schedule your appointment should you arrive 10 or more minutes late.  We strive to give you quality time with our providers, and arriving late affects you and other patients whose appointments are after yours.  Also, if you no show three or more times for appointments you may be dismissed from the clinic at the providers discretion.     Again, thank you for choosing [facility].  Our hope is that these requests will decrease the amount of time that you wait before being seen by our physicians.       _____________________________________________________________  Should you have questions after your visit to [facility], please contact our office at [phone] between the hours of 8:30 a.m. and 4:30 p.m.  Voicemails left after 4:30 p.m. will not be returned until the following business day.  For prescription refill requests, have your pharmacy contact our office.       Resources For Cancer Patients and their Caregivers ? American Cancer Society: Can assist with transportation, wigs, general needs, runs Look Good Feel Better.        6066918707 ? Cancer Care: Provides financial assistance, online support groups, medication/co-pay assistance.  1-800-813-HOPE (463) 441-5473) ? [place] Assists [place] cancer patients and their families through emotional , educational and financial support.  (667) 637-1948 ? [place]  DSS Where to apply for food stamps, Medicaid and utility assistance. (678) 824-2898 ? [employer]: Transportation to medical appointments. 5871825070 ? Social Security Administration: May apply for disability if have a Stage IV cancer. 773-006-1450 (347)807-3285 ? [employer], Disability and Transit Services: Assists with nutrition, care and transit needs. [place] Support Programs:   > Cancer Support Group  2nd Tuesday of the month 1pm-2pm, Journey Room   > Creative Journey  3rd Tuesday of the month 1130am-1pm, Journey Room

## 2017-12-27 NOTE — Progress Notes (Signed)
Diagnosis Pulmonary nodule - Plan: CBC with Differential/Platelet, Comprehensive metabolic panel, Lactate dehydrogenase  Staging Cancer Staging No matching staging information was found for the patient.  CHIEF COMPLAINT Squamous cell carcinoma of the uvula, suspicious for invasion Tobacco Abuse  Assessment and plan:  1.  Pulmonary nodule.  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  She has a long smoking history.  Pet scan that was done on 12/25/2017 showed IMPRESSION: 15 mm mildly thick-walled cavitary lesion in the posterior right upper lobe, nonspecific. Differential considerations include squamous cell neoplasm/metastasis or infection. Follow-up CT chest is suggested in 3-6 months.  Linear/patchy opacity in the left lower lobe, favoring infectious/inflammatory scarring. Attention on follow-up is suggested.  Otherwise, no findings suspicious for recurrent or metastatic Disease.  Based on her prior history of malignancy and long smoking history she will be referred to Dr. Roxan Hockey of CT surgery for evaluation.  She will return to clinic in 4-6 weeks for follow-up pending his evaluation.  All questions answered and she expressed understanding of the information presented.  2.  Squamous cell carcinoma of the uvula, suspicious for invasion.  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  She was followed by Dr. Talbert Cage.  She continues to smoke.  She was complaining of weight loss, fatigue, anorexia.  Based on her history and ongoing smoking she underwent Pet imaging  for further evaluation on 12/25/2017 and showed pulmonary nodule in RUL.  She is referred to Dr. Roxan Hockey  of CT surgery for evaluation.  3.  Anorexia and weight loss.  She weighs 83 pounds.  She continues to smoke.  PET scan shows pulmonary nodule.  She has been referred to CT surgery for evaluation.  Based on head and neck cancer history will ask for dietary evaluation for  patient.  4.  Anemia.  Hemoglobin is 10. 7  Ferritin 27, SPEPnegative, B12 normal.  Will repeat CBC on RTC.    5.  Smoking.  Cessation is recommended.  Interval History:  72 y.o. female followed for squamous cell carcinoma of the uvula, suspicious for invasion.  She was followed by Dr. Talbert Cage and was seen for evaluation due to severe sore throat which persisted through antibiotics. She underwent a laryngoscopy on 06/08/2016 with Dr. Benjamine Mola showing severe posterior laryngeal edema. 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 She then underwent a direct laryngoscopy and biopsy of her uvula on 06/08/2016 with Dr. Benjamine Mola. Pathology of the uvula revealed squamous cell carcinoma suspicious for invasion.  She was hospitalized from 02/16/2017 through 02/23/2017 for COPD exacerbation. She was in rehabilitation after that discharge.    Patient continues to have congestion in her chest.  She has not seen Dr. Benjamine Mola in the past year and is overdue for follow-up visit. She continues to smoke daily and smells on smoke on each clinic visit.    Current Status: Patient is seen today for follow-up.  She is here to go over Pet scan.     PATHOLOGY:  06/08/2016:       Problem List Patient Active Problem List   Diagnosis Date Noted  . Closed right hip fracture ([place]) greater Trochanter 07/13/17 [S72.001A] 07/13/2017  . Vomiting and diarrhea [R11.10, R19.7] 07/13/2017  . Fall at home, initial encounter [W19.Merril Abbe, G66.599] 07/13/2017  . Hip fracture ([place]) [S72.009A] 07/13/2017  . COPD with acute exacerbation ([place]) [J44.1] 02/16/2017  . Tobacco  abuse [Z72.0] 02/16/2017  . Malnutrition of moderate degree [E44.0] 09/22/2016  . Hypomagnesemia [E83.42] 09/21/2016  . Tobacco use [Z72.0] 08/03/2016  . Hyperlipidemia [E78.5] 08/03/2016  . Hypertension [I10] 08/03/2016  . History of Helicobacter pylori infection [Z86.19]  08/03/2016  . History of anemia [Z86.2] 08/03/2016  . Diverticulosis [K57.90] 08/03/2016  . Arthritis [M19.90] 08/03/2016  . Carcinoma of uvula ([place]) [C05.2] 07/13/2016  . Chronic obstructive pulmonary disease with acute exacerbation ([place]) [J44.1]   . Acute respiratory failure with hypoxia ([place]) [J96.01] 04/03/2015  . COPD exacerbation ([place]) [J44.1] 04/03/2015  . Hyponatremia [E87.1] 04/03/2015  . Dehydration [E86.0] 04/03/2015  . Hyperglycemia [R73.9] 04/03/2015  . Anxiety [F41.9]   . COPD (chronic obstructive pulmonary disease) ([place]) [J44.9]   . Peripheral neuropathy ([place]) [G62.9]   . Tobacco dependence [F17.200]   . Back pain, chronic [M54.9, G89.29] 04/19/2013  . Abnormal weight loss [R63.4] 02/19/2013  . Anemia, iron deficiency [D50.9] 02/19/2013  . GERD (gastroesophageal reflux disease) [K21.9] 02/19/2013    Past Medical History Past Medical History:  Diagnosis Date  . Acute respiratory failure with hypoxia ([place])   . Anxiety   . Cancer (HCC)    uvular per pt  . COPD (chronic obstructive pulmonary disease) ([place])   . DDD (degenerative disc disease), lumbar   . Degenerative joint disease of spine   . Diverticulosis   . GERD (gastroesophageal reflux disease)   . H. pylori infection    treated 02/2013.  [name] History of UTI   . Hypercholesterolemia   . Hypertension   . Hyponatremia   . Peripheral neuropathy   . Shortness of breath    with exertion  . Tobacco abuse     Past Surgical History Past Surgical History:  Procedure Laterality Date  . ABDOMINAL EXPLORATION SURGERY     age 32, large ovarian cyst  . BIOPSY N/A 03/01/2013   CHY:IFOY hiatal hernia; otherwise, normal examination/ Status post biopsies as described above. SB bx negative for Celiac. +h/pylori  . CATARACT EXTRACTION W/ INTRAOCULAR LENS  IMPLANT, BILATERAL    . COLONOSCOPY  10/16/09   Jenkins:3 polypsin the sigmoid colon/polyps in the descending colon and the rectum/scattered diverticulum. hyperplastic  .  COLONOSCOPY WITH ESOPHAGOGASTRODUODENOSCOPY (EGD) N/A 03/01/2013   DXA:JOINOMVE colonic polyps - treated/removed as described above. Colonic diverticulosis. Hyperplastic. Next TCS 02/2018.  [name] DIRECT LARYNGOSCOPY N/A 06/08/2016   Procedure: DIRECT LARYNGOSCOPY AND BIOPSY;  Surgeon: Leta Baptist, MD;  Location: [facility] OR;  Service: ENT;  Laterality: N/A;  . PARTIAL HYSTERECTOMY     age 33  . UVULECTOMY      Family History Family History  Problem Relation Age of Onset  . Leukemia Father   . Thyroid disease Father   . Stomach cancer Paternal Grandfather   . Colon cancer Neg Hx      Social History  reports that she has been smoking cigarettes.  She has a 75.00 pack-year smoking history. She has never used smokeless tobacco. She reports that she drinks alcohol. She reports that she does not use drugs.  Medications  Current Outpatient Medications:  [name]  Melatonin 5 MG TABS, Take 5 mg by mouth at bedtime as needed., Disp: , Rfl:  .  acetaminophen (TYLENOL) 500 MG tablet, Take 500 mg by mouth every 6 (six) hours as needed for mild pain., Disp: , Rfl:  .  albuterol (PROAIR HFA) 108 (90 BASE) MCG/ACT inhaler, Inhale 2 puffs into the lungs every 4 (four) hours as needed for wheezing or shortness of breath.,  Disp: 1 Inhaler, Rfl: 1 .  ALPRAZolam (XANAX) 0.5 MG tablet, Take 1 tablet (0.5 mg total) by mouth 2 (two) times daily as needed for anxiety., Disp: 15 tablet, Rfl: 0 .  amLODipine (NORVASC) 5 MG tablet, TAKE 1 TABLET BY MOUTH ONCE A DAY., Disp: 30 tablet, Rfl: 1 .  atorvastatin (LIPITOR) 20 MG tablet, Take 20 mg by mouth daily., Disp: , Rfl:  .  escitalopram (LEXAPRO) 10 MG tablet, Take 10 mg by mouth daily. , Disp: , Rfl:  .  gabapentin (NEURONTIN) 100 MG capsule, Take 1 capsule by mouth daily., Disp: , Rfl:  .  HYDROcodone-acetaminophen (NORCO) 5-325 MG tablet, Take 1 tablet every 6 (six) hours as needed by mouth for moderate pain., Disp: 30 tablet, Rfl: 0 .  methocarbamol (ROBAXIN) 500 MG tablet, Take 1  tablet (500 mg total) by mouth every 8 (eight) hours as needed for muscle spasms., Disp: 60 tablet, Rfl: 0 .  omeprazole (PRILOSEC) 20 MG capsule, Take 1 capsule by mouth daily., Disp: , Rfl:  .  SYMBICORT 160-4.5 MCG/ACT inhaler, Inhale 2 puffs into the lungs 2 (two) times daily., Disp: , Rfl:  .  tiotropium (SPIRIVA) 18 MCG inhalation capsule, Place 18 mcg into inhaler and inhale daily as needed. Shortness of breath, Disp: , Rfl:  No current facility-administered medications for this visit.   Facility-Administered Medications Ordered in Other Visits:  .  fludeoxyglucose F - 18 (FDG) injection 10 millicurie, 10 millicurie, Intravenous, Once, Lavonia Dana, MD  Allergies Prednisone; Chantix [varenicline]; and Codeine  Review of Systems Review of Systems - Oncology ROS as per HPI otherwise 12 point ROS is negative.   Physical Exam  Vitals:  Pt is on 3 L of oxygen Wt Readings from Last 3 Encounters:  12/27/17 82 lb 8 oz (37.4 kg)  12/12/17 83 lb 9.6 oz (37.9 kg)  09/12/17 85 lb (38.6 kg)   Temp Readings from Last 3 Encounters:  12/27/17 98.6 F (37 C) (Oral)  12/12/17 98.3 F (36.8 C) (Oral)  07/14/17 98.2 F (36.8 C) (Oral)   BP Readings from Last 3 Encounters:  12/27/17 (!) 116/54  12/12/17 (!) 159/54  09/12/17 (!) 163/76   Pulse Readings from Last 3 Encounters:  12/27/17 78  12/12/17 89  09/12/17 88   Constitutional: Well-developed, well-nourished, and in no distress.   HENT: Head: Normocephalic and atraumatic. Mouth/Throat: No oropharyngeal exudate. Mucosa moist. Evidence of prior uvelectomy. Eyes: Pupils are equal, round, and reactive to light. Conjunctivae are normal. No scleral icterus.  Neck: Normal range of motion. Neck supple. No JVD present.  Cardiovascular: Normal rate, regular rhythm and normal heart sounds.  Exam reveals no gallop and no friction rub.   No murmur heard. Pulmonary/Chest: Effort normal, Coarse BS.   Abdominal: Soft. Bowel sounds are normal.  No distension. There is no tenderness. There is no guarding.  Musculoskeletal: No edema or tenderness.  Lymphadenopathy: No cervical, axillary or supraclavicular adenopathy.  Neurological: Alert and oriented to person, place, and time. No cranial nerve deficit.  Skin: Skin is warm and dry. No rash noted. No erythema. No pallor.  Psychiatric: Affect and judgment normal.   Labs No visits with results within 3 Day(s) from this visit.  Latest known visit with results is:  Office Visit on 12/12/2017  Component Date Value Ref Range Status  . Vitamin B-12 12/12/2017 590  180 - 914 pg/mL Final   Comment: (NOTE) This assay is not validated for testing neonatal or myeloproliferative syndrome specimens for Vitamin  B12 levels. Performed at [facility], [place] [address] [place], [place] [address]   . Total Protein ELP 12/12/2017 6.6  6.0 - 8.5 g/dL Final  . Albumin ELP 12/12/2017 3.8  2.9 - 4.4 g/dL Final  . Alpha-1-Globulin 12/12/2017 0.3  0.0 - 0.4 g/dL Final  . Alpha-2-Globulin 12/12/2017 0.7  0.4 - 1.0 g/dL Final  . Beta Globulin 12/12/2017 1.0  0.7 - 1.3 g/dL Final  . Gamma Globulin 12/12/2017 0.7  0.4 - 1.8 g/dL Final  . M-Spike, % 12/12/2017 Not Observed  Not Observed g/dL Final  . SPE Interp. 12/12/2017 Comment   Final   Comment: (NOTE) The SPE pattern appears essentially unremarkable. Evidence of monoclonal protein is not apparent. Performed At: [facility] [place], [place] [id number] Rush Farmer MD [id number]   . Comment 12/12/2017 Comment   Final   Comment: (NOTE) Protein electrophoresis scan will follow via computer, mail, or courier delivery.   [name] GLOBULIN, TOTAL 12/12/2017 2.8  2.2 - 3.9 g/dL Corrected  . A/G Ratio 12/12/2017 1.4  0.7 - 1.7 Corrected   Performed at [facility], [address], [place], [place] [address]  . Ferritin 12/12/2017 27  11 - 307 ng/mL Final   Performed at [facility], [place] [address], [place], [place]  [address]  . LDH 12/12/2017 132  98 - 192 U/L Final   Performed at [facility], [address], [place], [place] [address]  . Sodium 12/12/2017 133* 135 - 145 mmol/L Final  . Potassium 12/12/2017 4.0  3.5 - 5.1 mmol/L Final  . Chloride 12/12/2017 92* 101 - 111 mmol/L Final  . CO2 12/12/2017 29  22 - 32 mmol/L Final  . Glucose, Bld 12/12/2017 86  65 - 99 mg/dL Final  . BUN 12/12/2017 6  6 - 20 mg/dL Final  . Creatinine, Ser 12/12/2017 0.52  0.44 - 1.00 mg/dL Final  . Calcium 12/12/2017 9.8  8.9 - 10.3 mg/dL Final  . Total Protein 12/12/2017 7.0  6.5 - 8.1 g/dL Final  . Albumin 12/12/2017 4.1  3.5 - 5.0 g/dL Final  . AST 12/12/2017 19  15 - 41 U/L Final  . ALT 12/12/2017 16  14 - 54 U/L Final  . Alkaline Phosphatase 12/12/2017 58  38 - 126 U/L Final  . Total Bilirubin 12/12/2017 0.8  0.3 - 1.2 mg/dL Final  . GFR calc non Af Amer 12/12/2017 >60  >60 mL/min Final  . GFR calc Af Amer 12/12/2017 >60  >60 mL/min Final   Comment: (NOTE) The eGFR has been calculated using the CKD EPI equation. This calculation has not been validated in all clinical situations. eGFR's persistently <60 mL/min signify possible Chronic Kidney Disease.   Georgiann Hahn gap 12/12/2017 12  5 - 15 Final   Performed at [facility], [address], [place],  [address]  . WBC 12/12/2017 10.5  4.0 - 10.5 K/uL Final  . RBC 12/12/2017 3.63* 3.87 - 5.11 MIL/uL Final  . Hemoglobin 12/12/2017 10.7* 12.0 - 15.0 g/dL Final  . HCT 12/12/2017 33.6* 36.0 - 46.0 % Final  . MCV 12/12/2017 92.6  78.0 - 100.0 fL Final  . MCH 12/12/2017 29.5  26.0 - 34.0 pg Final  . MCHC 12/12/2017 31.8  30.0 - 36.0 g/dL Final  . RDW 12/12/2017 14.9  11.5 - 15.5 % Final  . Platelets 12/12/2017 324  150 - 400 K/uL Final  . Neutrophils Relative % 12/12/2017 72  % Final  .  Neutro Abs 12/12/2017 7.6  1.7 - 7.7 K/uL Final  . Lymphocytes Relative 12/12/2017 21  % Final  . Lymphs Abs 12/12/2017 2.3  0.7 - 4.0 K/uL Final  . Monocytes Relative 12/12/2017 5   % Final  . Monocytes Absolute 12/12/2017 0.5  0.1 - 1.0 K/uL Final  . Eosinophils Relative 12/12/2017 1  % Final  . Eosinophils Absolute 12/12/2017 0.1  0.0 - 0.7 K/uL Final  . Basophils Relative 12/12/2017 1  % Final  . Basophils Absolute 12/12/2017 0.1  0.0 - 0.1 K/uL Final   Performed at [facility], [address], [place], [place] [address]     Pathology Orders Placed This Encounter  Procedures  . CBC with Differential/Platelet    Standing Status:   Future    Standing Expiration Date:   12/28/2018  . Comprehensive metabolic panel    Standing Status:   Future    Standing Expiration Date:   12/28/2018  . Lactate dehydrogenase    Standing Status:   Future    Standing Expiration Date:   12/28/2018       Zoila Shutter MD

## 2018-01-05 DIAGNOSIS — J9801 Acute bronchospasm: Secondary | ICD-10-CM | POA: Diagnosis not present

## 2018-01-05 DIAGNOSIS — J441 Chronic obstructive pulmonary disease with (acute) exacerbation: Secondary | ICD-10-CM | POA: Diagnosis not present

## 2018-01-10 ENCOUNTER — Encounter: Payer: Medicare Other | Admitting: Thoracic Surgery (Cardiothoracic Vascular Surgery)

## 2018-01-23 ENCOUNTER — Ambulatory Visit (HOSPITAL_COMMUNITY): Payer: Medicare Other | Admitting: Hematology

## 2018-01-23 NOTE — Progress Notes (Deleted)
Patient Care Team: [name], MD as PCP - General (Internal Medicine)  DIAGNOSIS: No diagnosis found.  SUMMARY OF ONCOLOGIC HISTORY:  No history exists.    CHIEF COMPLIANT: Stage I squamous cell carcinoma of uvula (T1N0) and pulmonary nodule   INTERVAL HISTORY: Lindsey Combs is a 72 y.o. female here for follow-up for h/o uvula cancer and lung nodule.    PET scan on 12/25/17 revealed 15 mm mildly thick-walled cavitary lesion in posterior RUL, which is nonspecific.  Differential diagnoses include squamous cell carcinoma or metastatic lesion vs infection.  Also noted to have linear/patchy opacity in LLL, favoring infectious/inflammatory scarring.  Follow-up CT chest in 3-6 months was suggested by radiologist.   ***   REVIEW OF SYSTEMS:   Constitutional: Denies fevers, chills or abnormal weight loss Eyes: Denies blurriness of vision Ears, nose, mouth, throat, and face: Denies mucositis or sore throat Respiratory: Denies cough, dyspnea or wheezes Cardiovascular: Denies palpitation, chest discomfort Gastrointestinal:  Denies nausea, heartburn or change in bowel habits Skin: Denies abnormal skin rashes Lymphatics: Denies new lymphadenopathy or easy bruising Neurological:Denies numbness, tingling or new weaknesses Behavioral/Psych: Mood is stable, no new changes  Extremities: No lower extremity edema All other systems were reviewed with the patient and are negative.  I have reviewed the past medical history, past surgical history, social history and family history with the patient and they are unchanged from previous note.  ALLERGIES:  is allergic to prednisone; chantix [varenicline]; and codeine.  MEDICATIONS:  Current Outpatient Medications  Medication Sig Dispense Refill  . acetaminophen (TYLENOL) 500 MG tablet Take 500 mg by mouth every 6 (six) hours as needed for mild pain.    [name] albuterol (PROAIR HFA) 108 (90 BASE) MCG/ACT inhaler Inhale 2 puffs into the lungs every 4 (four)  hours as needed for wheezing or shortness of breath. 1 Inhaler 1  . ALPRAZolam (XANAX) 0.5 MG tablet Take 1 tablet (0.5 mg total) by mouth 2 (two) times daily as needed for anxiety. 15 tablet 0  . amLODipine (NORVASC) 5 MG tablet TAKE 1 TABLET BY MOUTH ONCE A DAY. 30 tablet 1  . atorvastatin (LIPITOR) 20 MG tablet Take 20 mg by mouth daily.    [name] escitalopram (LEXAPRO) 10 MG tablet Take 10 mg by mouth daily.     [name] gabapentin (NEURONTIN) 100 MG capsule Take 1 capsule by mouth daily.    [name] HYDROcodone-acetaminophen (NORCO) 5-325 MG tablet Take 1 tablet every 6 (six) hours as needed by mouth for moderate pain. 30 tablet 0  . Melatonin 5 MG TABS Take 5 mg by mouth at bedtime as needed.    . methocarbamol (ROBAXIN) 500 MG tablet Take 1 tablet (500 mg total) by mouth every 8 (eight) hours as needed for muscle spasms. 60 tablet 0  . omeprazole (PRILOSEC) 20 MG capsule Take 1 capsule by mouth daily.    . SYMBICORT 160-4.5 MCG/ACT inhaler Inhale 2 puffs into the lungs 2 (two) times daily.    [name] tiotropium (SPIRIVA) 18 MCG inhalation capsule Place 18 mcg into inhaler and inhale daily as needed. Shortness of breath     No current facility-administered medications for this visit.     PHYSICAL EXAMINATION: ECOG PERFORMANCE STATUS: {CHL ONC ECOG PS:[phone]}  There were no vitals filed for this visit. There were no vitals filed for this visit.  GENERAL:alert, no distress and comfortable SKIN: skin color, texture, turgor are normal, no rashes or significant lesions EYES: normal, Conjunctiva are pink and non-injected, sclera clear  OROPHARYNX:no mucositis, no erythema and lips, buccal mucosa, and tongue normal  NECK: supple, thyroid normal size, non-tender, without nodularity LYMPH:  no palpable lymphadenopathy in the cervical, axillary or inguinal LUNGS: clear to auscultation and percussion with normal breathing effort HEART: regular rate & rhythm and no murmurs and no lower extremity  edema ABDOMEN:abdomen soft, non-tender and normal bowel sounds MUSCULOSKELETAL:no cyanosis of digits and no clubbing  EXTREMITIES: No lower extremity edema   LABORATORY DATA:  I have reviewed the data as listed CMP Latest Ref Rng & Units 12/12/2017 07/14/2017 07/13/2017  Glucose 65 - 99 mg/dL 86 100(H) 106(H)  BUN 6 - 20 mg/dL 6 7 8   Creatinine 0.44 - 1.00 mg/dL 0.52 0.59 0.55  Sodium 135 - 145 mmol/L 133(L) 132(L) 123(L)  Potassium 3.5 - 5.1 mmol/L 4.0 3.2(L) 3.5  Chloride 101 - 111 mmol/L 92(L) 97(L) 85(L)  CO2 22 - 32 mmol/L 29 29 27   Calcium 8.9 - 10.3 mg/dL 9.8 8.5(L) 9.8  Total Protein 6.5 - 8.1 g/dL 7.0 - 7.2  Total Bilirubin 0.3 - 1.2 mg/dL 0.8 - 1.1  Alkaline Phos 38 - 126 U/L 58 - 65  AST 15 - 41 U/L 19 - 38  ALT 14 - 54 U/L 16 - 24   No results found for: TDS287   Lab Results  Component Value Date   WBC 10.5 12/12/2017   HGB 10.7 (L) 12/12/2017   HCT 33.6 (L) 12/12/2017   MCV 92.6 12/12/2017   PLT 324 12/12/2017   NEUTROABS 7.6 12/12/2017    ASSESSMENT & PLAN:  No problem-specific Assessment & Plan notes found for this encounter.        I spent *** minutes talking to the patient of which more than half was spent in counseling and coordination of care.  Orders placed this encounter:  No orders of the defined types were placed in this encounter.  The patient has a good understanding of the overall plan. she agrees with it. she will call with any problems that may develop before the next visit here.  This note includes documentation from Mike Craze, NP, who was present during this patient's office visit and evaluation.  I have reviewed this note for its completeness and accuracy.  I have edited this note accordingly based on my findings and medical opinion.      Derek Jack, MD 01/23/18

## 2018-01-29 ENCOUNTER — Encounter: Payer: Medicare Other | Admitting: Thoracic Surgery (Cardiothoracic Vascular Surgery)

## 2018-01-30 ENCOUNTER — Encounter: Payer: Medicare Other | Admitting: Thoracic Surgery (Cardiothoracic Vascular Surgery)

## 2018-02-04 DIAGNOSIS — J441 Chronic obstructive pulmonary disease with (acute) exacerbation: Secondary | ICD-10-CM | POA: Diagnosis not present

## 2018-02-04 DIAGNOSIS — J9801 Acute bronchospasm: Secondary | ICD-10-CM | POA: Diagnosis not present

## 2018-02-06 DIAGNOSIS — Z5181 Encounter for therapeutic drug level monitoring: Secondary | ICD-10-CM | POA: Diagnosis not present

## 2018-02-06 DIAGNOSIS — J449 Chronic obstructive pulmonary disease, unspecified: Secondary | ICD-10-CM | POA: Diagnosis not present

## 2018-02-06 DIAGNOSIS — Z79899 Other long term (current) drug therapy: Secondary | ICD-10-CM | POA: Diagnosis not present

## 2018-02-08 ENCOUNTER — Encounter: Payer: Self-pay | Admitting: Thoracic Surgery (Cardiothoracic Vascular Surgery)

## 2018-02-08 ENCOUNTER — Institutional Professional Consult (permissible substitution) (INDEPENDENT_AMBULATORY_CARE_PROVIDER_SITE_OTHER): Payer: Medicare Other | Admitting: Thoracic Surgery (Cardiothoracic Vascular Surgery)

## 2018-02-08 ENCOUNTER — Other Ambulatory Visit: Payer: Self-pay

## 2018-02-08 VITALS — BP 100/60 | HR 72 | Resp 16 | Ht 66.0 in | Wt 78.0 lb

## 2018-02-08 DIAGNOSIS — D381 Neoplasm of uncertain behavior of trachea, bronchus and lung: Secondary | ICD-10-CM

## 2018-02-08 DIAGNOSIS — C140 Malignant neoplasm of pharynx, unspecified: Secondary | ICD-10-CM | POA: Diagnosis not present

## 2018-02-08 NOTE — Progress Notes (Signed)
PCP is [name], MD Referring Provider is Higgs, Mathis Dad, MD  Chief Complaint  Patient presents with  . Lung Lesion    RULobe per PET 12/25/17... H/O [facility] 2017    HPI: Lindsey Combs is sent for consultation regarding a right upper lobe nodule.  Lindsey Combs is a 72 year old woman with a history of ongoing tobacco abuse (1.5 packs daily for 50 years), COPD, severe protein calorie malnutrition, hypertension, hyperlipidemia, reflux, peripheral neuropathy, and squamous cell carcinoma of the uvula.  She had a uvulectomy in November 2017 at [facility].  It showed a superficially invasive squamous cell carcinoma.  She recently had a follow-up appointment with Dr. Walden Field.  A PET/CT was done.  It showed a 15 mm thick-walled cavitary nodule in the right upper lobe.  There was faint FDG uptake.  She continues to smoke a pack to 1.5 packs of cigarettes daily.  Her appetite is poor she is lost about 12 pounds over the past 3 months but almost half her body weight going back to 2017.  She is on 3 L nasal cannula home oxygen.  She gets short of breath with even minimal exertion.  She also complains of chest pain with exertion.  Zubrod Score: At the time of surgery this patient's most appropriate activity status/level should be described as: []     0    Normal activity, no symptoms []     1    Restricted in physical strenuous activity but ambulatory, able to do out light work [x]     2    Ambulatory and capable of self care, unable to do work activities, up and about >50 % of waking hours                              []     3    Only limited self care, in bed greater than 50% of waking hours []     4    Completely disabled, no self care, confined to bed or chair []     5    Moribund Probably closer to 3   Past Medical History:  Diagnosis Date  . Acute respiratory failure with hypoxia ([place])   . Anxiety   . Cancer (HCC)    uvular per pt  . COPD (chronic obstructive pulmonary disease) ([place])    . DDD (degenerative disc disease), lumbar   . Degenerative joint disease of spine   . Diverticulosis   . GERD (gastroesophageal reflux disease)   . H. pylori infection    treated 02/2013.  [name] History of UTI   . Hypercholesterolemia   . Hypertension   . Hyponatremia   . Peripheral neuropathy   . Shortness of breath    with exertion  . Tobacco abuse     Past Surgical History:  Procedure Laterality Date  . ABDOMINAL EXPLORATION SURGERY     age 55, large ovarian cyst  . BIOPSY N/A 03/01/2013   BTD:VVOH hiatal hernia; otherwise, normal examination/ Status post biopsies as described above. SB bx negative for Celiac. +h/pylori  . CATARACT EXTRACTION W/ INTRAOCULAR LENS  IMPLANT, BILATERAL    . COLONOSCOPY  10/16/09   Jenkins:3 polypsin the sigmoid colon/polyps in the descending colon and the rectum/scattered diverticulum. hyperplastic  . COLONOSCOPY WITH ESOPHAGOGASTRODUODENOSCOPY (EGD) N/A 03/01/2013   YWV:PXTGGYIR colonic polyps - treated/removed as described above. Colonic diverticulosis. Hyperplastic. Next TCS 02/2018.  [name] DIRECT LARYNGOSCOPY N/A 06/08/2016   Procedure: DIRECT LARYNGOSCOPY  AND BIOPSY;  Surgeon: Leta Baptist, MD;  Location: [facility] OR;  Service: ENT;  Laterality: N/A;  . PARTIAL HYSTERECTOMY     age 89  . UVULECTOMY      Family History  Problem Relation Age of Onset  . Leukemia Father   . Thyroid disease Father   . Stomach cancer Paternal Grandfather   . Colon cancer Neg Hx     Social History Social History   Tobacco Use  . Smoking status: Current Every Day Smoker    Packs/day: 1.50    Years: 50.00    Pack years: 75.00    Types: Cigarettes  . Smokeless tobacco: Never Used  Substance Use Topics  . Alcohol use: Yes    Comment: glass of wine occasionally  . Drug use: No    Current Outpatient Medications  Medication Sig Dispense Refill  . acetaminophen (TYLENOL) 500 MG tablet Take 500 mg by mouth every 6 (six) hours as needed for mild pain.    [name] albuterol (PROAIR  HFA) 108 (90 BASE) MCG/ACT inhaler Inhale 2 puffs into the lungs every 4 (four) hours as needed for wheezing or shortness of breath. 1 Inhaler 1  . ALPRAZolam (XANAX) 0.5 MG tablet Take 1 tablet (0.5 mg total) by mouth 2 (two) times daily as needed for anxiety. 15 tablet 0  . amLODipine (NORVASC) 5 MG tablet TAKE 1 TABLET BY MOUTH ONCE A DAY. 30 tablet 1  . atorvastatin (LIPITOR) 20 MG tablet Take 20 mg by mouth daily.    [name] escitalopram (LEXAPRO) 10 MG tablet Take 10 mg by mouth daily.     [name] gabapentin (NEURONTIN) 100 MG capsule Take 1 capsule by mouth daily.    . Melatonin 5 MG TABS Take 5 mg by mouth at bedtime as needed.    . methocarbamol (ROBAXIN) 500 MG tablet Take 1 tablet (500 mg total) by mouth every 8 (eight) hours as needed for muscle spasms. 60 tablet 0  . omeprazole (PRILOSEC) 20 MG capsule Take 1 capsule by mouth daily.    . SYMBICORT 160-4.5 MCG/ACT inhaler Inhale 2 puffs into the lungs 2 (two) times daily.    [name] tiotropium (SPIRIVA) 18 MCG inhalation capsule Place 18 mcg into inhaler and inhale daily as needed. Shortness of breath     No current facility-administered medications for this visit.     Allergies  Allergen Reactions  . Prednisone Nausea Only  . Chantix [Varenicline] Other (See Comments)    Mental status changes  . Codeine Itching    Review of Systems  Constitutional: Positive for activity change, appetite change, fatigue and unexpected weight change (lost 12 pounds in 3 months).  HENT: Positive for dental problem and hearing loss.   Eyes: Negative for visual disturbance.  Respiratory: Positive for cough, chest tightness and wheezing.   Cardiovascular: Positive for chest pain.  Gastrointestinal: Negative for abdominal distention and abdominal pain.  Genitourinary: Negative for difficulty urinating and dysuria.  Musculoskeletal: Positive for arthralgias and joint swelling.  Hematological: Negative for adenopathy. Bruises/bleeds easily.   Psychiatric/Behavioral: The patient is nervous/anxious.   All other systems reviewed and are negative.   BP 100/60 (BP Location: Left Arm, Patient Position: Sitting, Cuff Size: Normal)   Pulse 72   Resp 16   Ht 5\' 6"  (1.676 m)   Wt 78 lb (35.4 kg)   SpO2 96% Comment: ON 3L O2 CONTINUOUSLY  BMI 12.59 kg/m  Physical Exam  Constitutional: No distress.  Frail  HENT:  Head: Normocephalic and  atraumatic.  Nasal cannula oxygen in place  Eyes: Conjunctivae and EOM are normal. No scleral icterus.  Neck: No thyromegaly present.  Cardiovascular: Normal rate, regular rhythm and normal heart sounds. Exam reveals no gallop and no friction rub.  No murmur heard. Pulmonary/Chest: No stridor. No respiratory distress. She has no wheezes.  Diminished breath sounds bilaterally  Abdominal: She exhibits no distension. There is no tenderness.  Lymphadenopathy:    She has no cervical adenopathy.  Skin:  Clubbing.  Multiple ecchymoses  Psychiatric: She has a normal mood and affect.  Vitals reviewed.    Diagnostic Tests: NUCLEAR MEDICINE PET SKULL BASE TO THIGH  TECHNIQUE: 10.03 mCi F-18 FDG was injected intravenously. Full-ring PET imaging was performed from the skull base to thigh after the radiotracer. CT data was obtained and used for attenuation correction and anatomic localization.  Fasting blood glucose: 104 mg/dl  COMPARISON:  CT chest abdomen pelvis dated 07/01/2016.  FINDINGS: Mediastinal blood pool activity: SUV max 1.7  NECK: No hypermetabolic cervical lymphadenopathy.  Incidental CT findings: none  CHEST: 15 mm mildly thick-walled cavitary lesion in the posterior right upper lobe (series 3/image 37), max SUV 1.0. This appearance is nonspecific, although differential considerations include squamous cell neoplasm/metastasis or infection.  Linear/patchy opacity in the left lower lobe (series 3/image 94), max SUV 1.6. This appearance favors  infectious/inflammatory scarring, although attention on follow-up is suggested.  No hypermetabolic thoracic lymphadenopathy.  Incidental CT findings: Moderate centrilobular and paraseptal emphysematous changes. Atherosclerotic calcifications of the aortic arch. Coronary atherosclerosis of the LAD and right coronary artery.  ABDOMEN/PELVIS: No hypermetabolic lymphadenopathy in the abdomen/pelvis.  No abnormal hypermetabolism in the liver, spleen, pancreas, or adrenal glands.  Incidental CT findings: Calcified splenic granulomata. Atherosclerotic calcifications the abdominal aorta and branch vessels. Right renal cysts. Right renal vascular calcifications, although a 3 mm nonobstructing right upper pole renal calculus is not excluded. Status post hysterectomy. Thick-walled bladder.  SKELETON: No focal hypermetabolic activity to suggest skeletal metastasis.  Incidental CT findings: None.  IMPRESSION: 15 mm mildly thick-walled cavitary lesion in the posterior right upper lobe, nonspecific. Differential considerations include squamous cell neoplasm/metastasis or infection. Follow-up CT chest is suggested in 3-6 months.  Linear/patchy opacity in the left lower lobe, favoring infectious/inflammatory scarring. Attention on follow-up is suggested.  Otherwise, no findings suspicious for recurrent or metastatic disease.   Electronically Signed   By: Julian Hy M.D.   On: 12/26/2017 09:12 I personally reviewed the PET/CT images and concur with the findings noted above  Impression: Lindsey Combs is a 72 year old woman with a history of ongoing tobacco abuse, severe COPD, and squamous cell carcinoma of the uvula.  She is frail, oxygen dependent, and has lost 40% of her body weight over the past 2 years.  She now has a small cavitary nodule in the posterior aspect of the right upper lobe found on PET/CT.  Differential diagnosis includes primary lung cancer, metastatic  cancer, and infection.  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  She is not a surgical candidate.  The difficulty in her case arises in how to establish the diagnosis.  She would be extremely high risk for pneumothorax with a CT-guided biopsy.  Navigational bronchoscopy would have a very low yield and would require general anesthesia.  She is a poor candidate for that as well.  She says that she would not agree to be put to sleep under any  circumstances.  I think the best option in her case would be referral to radiation oncology for consideration for stereotactic radiation without a biopsy.  They have may or may not feel that that is appropriate.  They may wish to watch the nodule longer to see if there is progression before instituting therapy.  Plan: Referral to radiation oncology  Follow-up with Dr. Abigail Butts, MD [facility] [phone]

## 2018-02-20 ENCOUNTER — Institutional Professional Consult (permissible substitution): Payer: Medicare Other | Admitting: Radiation Oncology

## 2018-02-20 ENCOUNTER — Ambulatory Visit: Payer: Medicare Other

## 2018-02-23 ENCOUNTER — Ambulatory Visit (HOSPITAL_COMMUNITY): Payer: Medicare Other | Admitting: Hematology

## 2018-03-02 ENCOUNTER — Ambulatory Visit (HOSPITAL_COMMUNITY): Payer: Medicare Other | Admitting: Internal Medicine

## 2018-03-07 DIAGNOSIS — J9801 Acute bronchospasm: Secondary | ICD-10-CM | POA: Diagnosis not present

## 2018-03-07 DIAGNOSIS — J441 Chronic obstructive pulmonary disease with (acute) exacerbation: Secondary | ICD-10-CM | POA: Diagnosis not present

## 2018-03-09 ENCOUNTER — Encounter (HOSPITAL_COMMUNITY): Payer: Self-pay | Admitting: Internal Medicine

## 2018-03-09 ENCOUNTER — Inpatient Hospital Stay (HOSPITAL_COMMUNITY): Payer: Medicare Other | Attending: Internal Medicine | Admitting: Internal Medicine

## 2018-03-09 DIAGNOSIS — Z5181 Encounter for therapeutic drug level monitoring: Secondary | ICD-10-CM | POA: Diagnosis not present

## 2018-03-09 DIAGNOSIS — R630 Anorexia: Secondary | ICD-10-CM | POA: Diagnosis not present

## 2018-03-09 DIAGNOSIS — Z800 Family history of malignant neoplasm of digestive organs: Secondary | ICD-10-CM | POA: Insufficient documentation

## 2018-03-09 DIAGNOSIS — R634 Abnormal weight loss: Secondary | ICD-10-CM | POA: Diagnosis not present

## 2018-03-09 DIAGNOSIS — R300 Dysuria: Secondary | ICD-10-CM | POA: Diagnosis not present

## 2018-03-09 DIAGNOSIS — F17210 Nicotine dependence, cigarettes, uncomplicated: Secondary | ICD-10-CM | POA: Insufficient documentation

## 2018-03-09 DIAGNOSIS — R911 Solitary pulmonary nodule: Secondary | ICD-10-CM | POA: Insufficient documentation

## 2018-03-09 DIAGNOSIS — I10 Essential (primary) hypertension: Secondary | ICD-10-CM | POA: Diagnosis not present

## 2018-03-09 DIAGNOSIS — R5383 Other fatigue: Secondary | ICD-10-CM | POA: Diagnosis not present

## 2018-03-09 DIAGNOSIS — E871 Hypo-osmolality and hyponatremia: Secondary | ICD-10-CM | POA: Diagnosis not present

## 2018-03-09 DIAGNOSIS — J441 Chronic obstructive pulmonary disease with (acute) exacerbation: Secondary | ICD-10-CM | POA: Diagnosis not present

## 2018-03-09 DIAGNOSIS — Z9981 Dependence on supplemental oxygen: Secondary | ICD-10-CM | POA: Diagnosis not present

## 2018-03-09 DIAGNOSIS — C140 Malignant neoplasm of pharynx, unspecified: Secondary | ICD-10-CM | POA: Diagnosis not present

## 2018-03-09 DIAGNOSIS — Z79899 Other long term (current) drug therapy: Secondary | ICD-10-CM

## 2018-03-09 DIAGNOSIS — J449 Chronic obstructive pulmonary disease, unspecified: Secondary | ICD-10-CM | POA: Insufficient documentation

## 2018-03-09 DIAGNOSIS — E440 Moderate protein-calorie malnutrition: Secondary | ICD-10-CM | POA: Diagnosis not present

## 2018-03-09 DIAGNOSIS — E785 Hyperlipidemia, unspecified: Secondary | ICD-10-CM | POA: Diagnosis not present

## 2018-03-09 DIAGNOSIS — G629 Polyneuropathy, unspecified: Secondary | ICD-10-CM | POA: Diagnosis not present

## 2018-03-09 DIAGNOSIS — Z720 Tobacco use: Secondary | ICD-10-CM | POA: Insufficient documentation

## 2018-03-09 DIAGNOSIS — C052 Malignant neoplasm of uvula: Secondary | ICD-10-CM | POA: Diagnosis not present

## 2018-03-09 NOTE — Progress Notes (Signed)
Diagnosis Solitary pulmonary nodule - Plan: NM PET Image Restag (PS) Skull Base To Thigh, CBC with Differential/Platelet, Comprehensive metabolic panel, Lactate dehydrogenase  Staging Cancer Staging No matching staging information was found for the patient.  Assessment and Plan:  1.  Pulmonary nodule.  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  She has a long smoking history.  Pet scan that was done on 12/25/2017 showed IMPRESSION: 15 mm mildly thick-walled cavitary lesion in the posterior right upper lobe, nonspecific. Differential considerations include squamous cell neoplasm/metastasis or infection. Follow-up CT chest is suggested in 3-6 months.  Linear/patchy opacity in the left lower lobe, favoring infectious/inflammatory scarring. Attention on follow-up is suggested.  Otherwise, no findings suspicious for recurrent or metastatic Disease.  Based on her prior history of malignancy and long smoking history she was referred to Dr. Roxan Hockey of CT surgery for evaluation.    She was seen by Dr. Roxan Hockey on 02/08/2018 and his review of scans showed a small cavitary nodule in the posterior aspect of the right upper lobe found on PET/CT.  Differential diagnosis includes primary lung cancer, metastatic cancer, and infection.   She is not a surgical candidate.  She was felt to be high risk for pneumothorax with a CT-guided biopsy.  Navigational bronchoscopy would have a very low yield and would require general anesthesia.  She is a poor candidate for that as well.  She says that she would not agree to be put to sleep under any circumstances.  He recommended RT referral which she was initially resistant to.  I discussed with her today RT will consult on the case to determine if they would recommended therapy or follow-up imaging.  Pending their evaluation, she will be set up for repeat imaging in  04/2018 for ongoing follow-up of pulmonary nodule.    All questions answered and she expressed understanding of the information presented.  2.  Squamous cell carcinoma of the uvula, suspicious for invasion.  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  She was followed by Dr. Talbert Cage.  She continues to smoke.  She was complaining of weight loss, fatigue, anorexia.  Based on her history and ongoing smoking she underwent Pet imaging  for further evaluation on 12/25/2017 and showed pulmonary nodule in RUL.  She has been seen by Dr. Roxan Hockey and is referred to RT for evaluation.    3.  Anorexia and weight loss.  She weighs 80 pounds.  She continues to smoke.  PET scan shows pulmonary nodule.  She has been seen by CT surgery for evaluation.  Continue to follow-up with nutrition as recommended.    4.  Anemia.  Labs done 11/2017 showed HB 10.7.  SPEP negative.  She will have repeat labs on RTC in 04/2018.     5.  Smoking.  Cessation is recommended.  6.  HTN.  BP is 139/46.  Follow-up with PCP.    Interval History:  72 y.o. female followed for squamous cell carcinoma of the uvula, suspicious for invasion.  She was followed by Dr. Talbert Cage and was seen for evaluation due to severe sore throat which persisted through antibiotics. She underwent a laryngoscopy on 06/08/2016 with Dr. Benjamine Mola showing severe posterior laryngeal edema. 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 She then underwent a direct laryngoscopy and biopsy of  her uvula on 06/08/2016 with Dr. Benjamine Mola. Pathology of the uvula revealed squamous cell carcinoma suspicious for invasion.  She was hospitalized from 02/16/2017 through 02/23/2017 for COPD exacerbation. She was in rehabilitation after that discharge.    Patient continues to have congestion in her chest.  She has not seen Dr. Benjamine Mola in the past year and is overdue for follow-up  visit. She continues to smoke daily and smells on smoke on each clinic visit.    Current Status: Patient is seen today for follow-up.  She reports she does not desire RT.  She is accompanied by family member.    PATHOLOGY:  06/08/2016:       Problem List Patient Active Problem List   Diagnosis Date Noted  . Closed right hip fracture ([place]) greater Trochanter 07/13/17 [S72.001A] 07/13/2017  . Vomiting and diarrhea [R11.10, R19.7] 07/13/2017  . Fall at home, initial encounter [W19.Merril Abbe, S81.103] 07/13/2017  . Hip fracture ([place]) [S72.009A] 07/13/2017  . COPD with acute exacerbation ([place]) [J44.1] 02/16/2017  . Tobacco abuse [Z72.0] 02/16/2017  . Malnutrition of moderate degree [E44.0] 09/22/2016  . Hypomagnesemia [E83.42] 09/21/2016  . Tobacco use [Z72.0] 08/03/2016  . Hyperlipidemia [E78.5] 08/03/2016  . Hypertension [I10] 08/03/2016  . History of Helicobacter pylori infection [Z86.19] 08/03/2016  . History of anemia [Z86.2] 08/03/2016  . Diverticulosis [K57.90] 08/03/2016  . Arthritis [M19.90] 08/03/2016  . Carcinoma of uvula ([place]) [C05.2] 07/13/2016  . Chronic obstructive pulmonary disease with acute exacerbation ([place]) [J44.1]   . Acute respiratory failure with hypoxia ([place]) [J96.01] 04/03/2015  . COPD exacerbation ([place]) [J44.1] 04/03/2015  . Hyponatremia [E87.1] 04/03/2015  . Dehydration [E86.0] 04/03/2015  . Hyperglycemia [R73.9] 04/03/2015  . Anxiety [F41.9]   . COPD (chronic obstructive pulmonary disease) ([place]) [J44.9]   . Peripheral neuropathy ([place]) [G62.9]   . Tobacco dependence [F17.200]   . Back pain, chronic [M54.9, G89.29] 04/19/2013  . Abnormal weight loss [R63.4] 02/19/2013  . Anemia, iron deficiency [D50.9] 02/19/2013  . GERD (gastroesophageal reflux disease) [K21.9] 02/19/2013    Past Medical History Past Medical History:  Diagnosis Date  . Acute respiratory failure with hypoxia ([place])   . Anxiety   . Cancer (HCC)    uvular per pt  . COPD (chronic  obstructive pulmonary disease) ([place])   . DDD (degenerative disc disease), lumbar   . Degenerative joint disease of spine   . Diverticulosis   . GERD (gastroesophageal reflux disease)   . H. pylori infection    treated 02/2013.  [name] History of UTI   . Hypercholesterolemia   . Hypertension   . Hyponatremia   . Peripheral neuropathy   . Shortness of breath    with exertion  . Tobacco abuse     Past Surgical History Past Surgical History:  Procedure Laterality Date  . ABDOMINAL EXPLORATION SURGERY     age 79, large ovarian cyst  . BIOPSY N/A 03/01/2013   PRX:YVOP hiatal hernia; otherwise, normal examination/ Status post biopsies as described above. SB bx negative for Celiac. +h/pylori  . CATARACT EXTRACTION W/ INTRAOCULAR LENS  IMPLANT, BILATERAL    . COLONOSCOPY  10/16/09   Jenkins:3 polypsin the sigmoid colon/polyps in the descending colon and the rectum/scattered diverticulum. hyperplastic  . COLONOSCOPY WITH ESOPHAGOGASTRODUODENOSCOPY (EGD) N/A 03/01/2013   FYT:WKMQKMMN colonic polyps - treated/removed as described above. Colonic diverticulosis. Hyperplastic. Next TCS 02/2018.  [name] DIRECT LARYNGOSCOPY N/A 06/08/2016   Procedure: DIRECT LARYNGOSCOPY AND BIOPSY;  Surgeon: Leta Baptist, MD;  Location: [facility];  Service: ENT;  Laterality:  N/A;  . PARTIAL HYSTERECTOMY     age 62  . UVULECTOMY      Family History Family History  Problem Relation Age of Onset  . Leukemia Father   . Thyroid disease Father   . Stomach cancer Paternal Grandfather   . Colon cancer Neg Hx      Social History  reports that she has been smoking cigarettes.  She has a 75.00 pack-year smoking history. She has never used smokeless tobacco. She reports that she drinks alcohol. She reports that she does not use drugs.  Medications  Current Outpatient Medications:  .  acetaminophen (TYLENOL) 500 MG tablet, Take 500 mg by mouth every 6 (six) hours as needed for mild pain., Disp: , Rfl:  .  albuterol (PROAIR HFA) 108 (90  BASE) MCG/ACT inhaler, Inhale 2 puffs into the lungs every 4 (four) hours as needed for wheezing or shortness of breath., Disp: 1 Inhaler, Rfl: 1 .  ALPRAZolam (XANAX) 0.5 MG tablet, Take 1 tablet (0.5 mg total) by mouth 2 (two) times daily as needed for anxiety., Disp: 15 tablet, Rfl: 0 .  amLODipine (NORVASC) 5 MG tablet, TAKE 1 TABLET BY MOUTH ONCE A DAY., Disp: 30 tablet, Rfl: 1 .  atorvastatin (LIPITOR) 20 MG tablet, Take 20 mg by mouth daily., Disp: , Rfl:  .  escitalopram (LEXAPRO) 10 MG tablet, Take 10 mg by mouth daily. , Disp: , Rfl:  .  gabapentin (NEURONTIN) 100 MG capsule, Take 1 capsule by mouth daily., Disp: , Rfl:  .  Melatonin 5 MG TABS, Take 5 mg by mouth at bedtime as needed., Disp: , Rfl:  .  methocarbamol (ROBAXIN) 500 MG tablet, Take 1 tablet (500 mg total) by mouth every 8 (eight) hours as needed for muscle spasms., Disp: 60 tablet, Rfl: 0 .  omeprazole (PRILOSEC) 20 MG capsule, Take 1 capsule by mouth daily., Disp: , Rfl:  .  SYMBICORT 160-4.5 MCG/ACT inhaler, Inhale 2 puffs into the lungs 2 (two) times daily., Disp: , Rfl:  .  tiotropium (SPIRIVA) 18 MCG inhalation capsule, Place 18 mcg into inhaler and inhale daily as needed. Shortness of breath, Disp: , Rfl:   Allergies Prednisone; Chantix [varenicline]; and Codeine  Review of Systems Review of Systems - Oncology ROS as per HPI negative other than SOB.     Physical Exam  Vitals Wt Readings from Last 3 Encounters:  03/09/18 80 lb 3.2 oz (36.4 kg)  02/08/18 78 lb (35.4 kg)  12/27/17 82 lb 8 oz (37.4 kg)   Temp Readings from Last 3 Encounters:  03/09/18 98.1 F (36.7 C) (Oral)  12/27/17 98.6 F (37 C) (Oral)  12/12/17 98.3 F (36.8 C) (Oral)   BP Readings from Last 3 Encounters:  03/09/18 (!) 139/46  02/08/18 100/60  12/27/17 (!) 116/54   Pulse Readings from Last 3 Encounters:  03/09/18 91  02/08/18 72  12/27/17 78   Constitutional: Chronically ill appearing in no distress.   HENT: Head:  Normocephalic and atraumatic. Oxygen via [place].   Mouth/Throat: No oropharyngeal exudate. Mucosa moist. Eyes: Pupils are equal, round, and reactive to light. Conjunctivae are normal. No scleral icterus.  Neck: Normal range of motion. Neck supple. No JVD present.  Cardiovascular: Normal rate, regular rhythm and normal heart sounds.  Exam reveals no gallop and no friction rub.   No murmur heard. Pulmonary/Chest: Coarse BS.   Abdominal: Soft. Bowel sounds are normal. No distension. There is no tenderness. There is no guarding.  Musculoskeletal: No edema  or tenderness.  Lymphadenopathy: No cervical, axillaryor supraclavicular adenopathy.  Neurological: Alert and oriented to person, place, and time. No cranial nerve deficit.  Skin: Skin is warm and dry. No rash noted. No erythema. No pallor.  Psychiatric: Affect and judgment normal.   Labs No visits with results within 3 Day(s) from this visit.  Latest known visit with results is:  Office Visit on 12/12/2017  Component Date Value Ref Range Status  . Vitamin B-12 12/12/2017 590  180 - 914 pg/mL Final   Comment: (NOTE) This assay is not validated for testing neonatal or myeloproliferative syndrome specimens for Vitamin B12 levels. Performed at [facility], [place] [address] [place], [place] [address]   . Total Protein ELP 12/12/2017 6.6  6.0 - 8.5 g/dL Final  . Albumin ELP 12/12/2017 3.8  2.9 - 4.4 g/dL Final  . Alpha-1-Globulin 12/12/2017 0.3  0.0 - 0.4 g/dL Final  . Alpha-2-Globulin 12/12/2017 0.7  0.4 - 1.0 g/dL Final  . Beta Globulin 12/12/2017 1.0  0.7 - 1.3 g/dL Final  . Gamma Globulin 12/12/2017 0.7  0.4 - 1.8 g/dL Final  . M-Spike, % 12/12/2017 Not Observed  Not Observed g/dL Final  . SPE Interp. 12/12/2017 Comment   Final   Comment: (NOTE) The SPE pattern appears essentially unremarkable. Evidence of monoclonal protein is not apparent. Performed At: [facility] [place], [place] [id number] Rush Farmer MD [id number]   . Comment 12/12/2017 Comment   Final   Comment: (NOTE) Protein electrophoresis scan will follow via computer, mail, or courier delivery.   [name] GLOBULIN, TOTAL 12/12/2017 2.8  2.2 - 3.9 g/dL Corrected  . A/G Ratio 12/12/2017 1.4  0.7 - 1.7 Corrected   Performed at [facility], [address], [place], [place] [address]  . Ferritin 12/12/2017 27  11 - 307 ng/mL Final   Performed at [facility], [place] [address], [place], [place] [address]  . LDH 12/12/2017 132  98 - 192 U/L Final   Performed at [facility], [address], [place], [place] [address]  . Sodium 12/12/2017 133* 135 - 145 mmol/L Final  . Potassium 12/12/2017 4.0  3.5 - 5.1 mmol/L Final  . Chloride 12/12/2017 92* 101 - 111 mmol/L Final  . CO2 12/12/2017 29  22 - 32 mmol/L Final  . Glucose, Bld 12/12/2017 86  65 - 99 mg/dL Final  . BUN 12/12/2017 6  6 - 20 mg/dL Final  . Creatinine, Ser 12/12/2017 0.52  0.44 - 1.00 mg/dL Final  . Calcium 12/12/2017 9.8  8.9 - 10.3 mg/dL Final  . Total Protein 12/12/2017 7.0  6.5 - 8.1 g/dL Final  . Albumin 12/12/2017 4.1  3.5 - 5.0 g/dL Final  . AST 12/12/2017 19  15 - 41 U/L Final  . ALT 12/12/2017 16  14 - 54 U/L Final  . Alkaline Phosphatase 12/12/2017 58  38 - 126 U/L Final  . Total Bilirubin 12/12/2017 0.8  0.3 - 1.2 mg/dL Final  . GFR calc non Af Amer 12/12/2017 >60  >60 mL/min Final  . GFR calc Af Amer 12/12/2017 >60  >60 mL/min Final   Comment: (NOTE) The eGFR has been calculated using the CKD EPI equation. This calculation has not been validated in all clinical situations. eGFR's persistently <60 mL/min signify possible Chronic Kidney Disease.   Georgiann Hahn gap 12/12/2017 12  5 - 15 Final   Performed at [facility] [address], [place], [place] [address]  . WBC  12/12/2017 10.5  4.0 - 10.5 K/uL Final  . RBC 12/12/2017 3.63* 3.87 - 5.11 MIL/uL Final  . Hemoglobin 12/12/2017 10.7* 12.0 - 15.0 g/dL Final  . HCT 12/12/2017 33.6* 36.0 - 46.0 % Final    . MCV 12/12/2017 92.6  78.0 - 100.0 fL Final  . MCH 12/12/2017 29.5  26.0 - 34.0 pg Final  . MCHC 12/12/2017 31.8  30.0 - 36.0 g/dL Final  . RDW 12/12/2017 14.9  11.5 - 15.5 % Final  . Platelets 12/12/2017 324  150 - 400 K/uL Final  . Neutrophils Relative % 12/12/2017 72  % Final  . Neutro Abs 12/12/2017 7.6  1.7 - 7.7 K/uL Final  . Lymphocytes Relative 12/12/2017 21  % Final  . Lymphs Abs 12/12/2017 2.3  0.7 - 4.0 K/uL Final  . Monocytes Relative 12/12/2017 5  % Final  . Monocytes Absolute 12/12/2017 0.5  0.1 - 1.0 K/uL Final  . Eosinophils Relative 12/12/2017 1  % Final  . Eosinophils Absolute 12/12/2017 0.1  0.0 - 0.7 K/uL Final  . Basophils Relative 12/12/2017 1  % Final  . Basophils Absolute 12/12/2017 0.1  0.0 - 0.1 K/uL Final   Performed at [facility], [address], [place], [place] [address]     Pathology Orders Placed This Encounter  Procedures  . NM PET Image Restag (PS) Skull Base To Thigh    Standing Status:   Future    Standing Expiration Date:   03/09/2019    Order Specific Question:   If indicated for the ordered procedure, I authorize the administration of a radiopharmaceutical per Radiology protocol    Answer:   Yes    Order Specific Question:   Preferred imaging location?    Answer:   [facility]    Order Specific Question:   Radiology Contrast Protocol - do NOT remove file path    Answer:   [email or web address]  . CBC with Differential/Platelet    Standing Status:   Future    Standing Expiration Date:   03/10/2019  . Comprehensive metabolic panel    Standing Status:   Future    Standing Expiration Date:   03/10/2019  . Lactate dehydrogenase    Standing Status:   Future    Standing Expiration Date:   03/10/2019       Zoila Shutter MD

## 2018-03-09 NOTE — Patient Instructions (Signed)
[facility] at [facility] Discharge Instructions  You saw Dr. Higgs today.   Thank you for choosing [facility] at [facility] to provide your oncology and hematology care.  To afford each patient quality time with our provider, please arrive at least 15 minutes before your scheduled appointment time.   If you have a lab appointment with the Cancer Center please come in thru the  Main Entrance and check in at the main information desk  You need to re-schedule your appointment should you arrive 10 or more minutes late.  We strive to give you quality time with our providers, and arriving late affects you and other patients whose appointments are after yours.  Also, if you no show three or more times for appointments you may be dismissed from the clinic at the providers discretion.     Again, thank you for choosing [facility].  Our hope is that these requests will decrease the amount of time that you wait before being seen by our physicians.       _____________________________________________________________  Should you have questions after your visit to [facility], please contact our office at [phone] between the hours of 8:30 a.m. and 4:30 p.m.  Voicemails left after 4:30 p.m. will not be returned until the following business day.  For prescription refill requests, have your pharmacy contact our office.       Resources For Cancer Patients and their Caregivers ? American Cancer Society: Can assist with transportation, wigs, general needs, runs Look Good Feel Better.        1-888-227-6333 ? Cancer Care: Provides financial assistance, online support groups, medication/co-pay assistance.  1-800-813-HOPE (4673) ? [facility] Assists [place] cancer patients and their families through emotional , educational and financial support.  336-427-4357 ? [place] DSS Where to apply for food  stamps, Medicaid and utility assistance. 336-342-1394 ? [facility]: Transportation to medical appointments. 336-347-2287 ? Social Security Administration: May apply for disability if have a Stage IV cancer. 336-342-7796 1-800-772-1213 ? [place] Aging, Disability and Transit Services: Assists with nutrition, care and transit needs. 336-349-2343  Cancer Center Support Programs:   > Cancer Support Group  2nd Tuesday of the month 1pm-2pm, Journey Room   > Creative Journey  3rd Tuesday of the month 1130am-1pm, Journey Room     

## 2018-03-12 ENCOUNTER — Encounter (HOSPITAL_COMMUNITY): Payer: Self-pay | Admitting: Lab

## 2018-03-12 ENCOUNTER — Telehealth (HOSPITAL_COMMUNITY): Payer: Self-pay

## 2018-03-12 NOTE — Telephone Encounter (Signed)
Received notification from [facility] that patient did not want to have radiation therapy. Reviewed with Dr. Walden Field. Per her request, I called patient to be sure she understood that she was being referred to radiation to see if they could offer any recommendations regarding treatment or imaging. Patient states she understood why she was being referred to radiation but has decided she just wants to be left alone. She states it is too hard for her to go anywhere due to her COPD. Notified MD and scheduling.

## 2018-03-12 NOTE — Progress Notes (Unsigned)
Referral sent to [facility]. Records faxed on 6/17

## 2018-04-06 DIAGNOSIS — J441 Chronic obstructive pulmonary disease with (acute) exacerbation: Secondary | ICD-10-CM | POA: Diagnosis not present

## 2018-04-06 DIAGNOSIS — J9801 Acute bronchospasm: Secondary | ICD-10-CM | POA: Diagnosis not present

## 2018-05-04 ENCOUNTER — Emergency Department (HOSPITAL_COMMUNITY): Payer: Medicare Other

## 2018-05-04 ENCOUNTER — Other Ambulatory Visit: Payer: Self-pay

## 2018-05-04 ENCOUNTER — Encounter (HOSPITAL_COMMUNITY): Payer: Self-pay | Admitting: Emergency Medicine

## 2018-05-04 ENCOUNTER — Inpatient Hospital Stay (HOSPITAL_COMMUNITY)
Admission: EM | Admit: 2018-05-04 | Discharge: 2018-05-06 | DRG: 190 | Disposition: A | Discharge status: Home / Self Care | Payer: Medicare Other | Attending: Internal Medicine | Admitting: Internal Medicine

## 2018-05-04 DIAGNOSIS — I10 Essential (primary) hypertension: Secondary | ICD-10-CM | POA: Diagnosis not present

## 2018-05-04 DIAGNOSIS — C7800 Secondary malignant neoplasm of unspecified lung: Secondary | ICD-10-CM | POA: Diagnosis present

## 2018-05-04 DIAGNOSIS — F17210 Nicotine dependence, cigarettes, uncomplicated: Secondary | ICD-10-CM | POA: Diagnosis present

## 2018-05-04 DIAGNOSIS — K219 Gastro-esophageal reflux disease without esophagitis: Secondary | ICD-10-CM | POA: Diagnosis present

## 2018-05-04 DIAGNOSIS — Z8619 Personal history of other infectious and parasitic diseases: Secondary | ICD-10-CM

## 2018-05-04 DIAGNOSIS — Z885 Allergy status to narcotic agent status: Secondary | ICD-10-CM | POA: Diagnosis not present

## 2018-05-04 DIAGNOSIS — R9431 Abnormal electrocardiogram [ECG] [EKG]: Secondary | ICD-10-CM | POA: Diagnosis not present

## 2018-05-04 DIAGNOSIS — M5136 Other intervertebral disc degeneration, lumbar region: Secondary | ICD-10-CM | POA: Diagnosis present

## 2018-05-04 DIAGNOSIS — E43 Unspecified severe protein-calorie malnutrition: Secondary | ICD-10-CM | POA: Diagnosis not present

## 2018-05-04 DIAGNOSIS — R32 Unspecified urinary incontinence: Secondary | ICD-10-CM | POA: Diagnosis present

## 2018-05-04 DIAGNOSIS — R0602 Shortness of breath: Secondary | ICD-10-CM | POA: Diagnosis not present

## 2018-05-04 DIAGNOSIS — J441 Chronic obstructive pulmonary disease with (acute) exacerbation: Secondary | ICD-10-CM | POA: Diagnosis not present

## 2018-05-04 DIAGNOSIS — M4854XA Collapsed vertebra, not elsewhere classified, thoracic region, initial encounter for fracture: Secondary | ICD-10-CM | POA: Diagnosis not present

## 2018-05-04 DIAGNOSIS — G629 Polyneuropathy, unspecified: Secondary | ICD-10-CM | POA: Diagnosis not present

## 2018-05-04 DIAGNOSIS — Z86010 Personal history of colonic polyps: Secondary | ICD-10-CM | POA: Diagnosis not present

## 2018-05-04 DIAGNOSIS — Z888 Allergy status to other drugs, medicaments and biological substances status: Secondary | ICD-10-CM | POA: Diagnosis not present

## 2018-05-04 DIAGNOSIS — F419 Anxiety disorder, unspecified: Secondary | ICD-10-CM | POA: Diagnosis present

## 2018-05-04 DIAGNOSIS — J449 Chronic obstructive pulmonary disease, unspecified: Secondary | ICD-10-CM | POA: Diagnosis not present

## 2018-05-04 DIAGNOSIS — J9621 Acute and chronic respiratory failure with hypoxia: Secondary | ICD-10-CM

## 2018-05-04 DIAGNOSIS — C329 Malignant neoplasm of larynx, unspecified: Secondary | ICD-10-CM | POA: Diagnosis not present

## 2018-05-04 DIAGNOSIS — C052 Malignant neoplasm of uvula: Secondary | ICD-10-CM | POA: Diagnosis present

## 2018-05-04 DIAGNOSIS — R64 Cachexia: Secondary | ICD-10-CM | POA: Diagnosis present

## 2018-05-04 DIAGNOSIS — Z720 Tobacco use: Secondary | ICD-10-CM | POA: Diagnosis present

## 2018-05-04 DIAGNOSIS — M6250 Muscle wasting and atrophy, not elsewhere classified, unspecified site: Secondary | ICD-10-CM | POA: Diagnosis present

## 2018-05-04 DIAGNOSIS — F17200 Nicotine dependence, unspecified, uncomplicated: Secondary | ICD-10-CM | POA: Diagnosis present

## 2018-05-04 DIAGNOSIS — Z9981 Dependence on supplemental oxygen: Secondary | ICD-10-CM | POA: Diagnosis not present

## 2018-05-04 DIAGNOSIS — Z79899 Other long term (current) drug therapy: Secondary | ICD-10-CM

## 2018-05-04 DIAGNOSIS — J439 Emphysema, unspecified: Principal | ICD-10-CM | POA: Diagnosis present

## 2018-05-04 DIAGNOSIS — E785 Hyperlipidemia, unspecified: Secondary | ICD-10-CM | POA: Diagnosis present

## 2018-05-04 DIAGNOSIS — Z23 Encounter for immunization: Secondary | ICD-10-CM | POA: Diagnosis not present

## 2018-05-04 DIAGNOSIS — J9600 Acute respiratory failure, unspecified whether with hypoxia or hypercapnia: Secondary | ICD-10-CM | POA: Diagnosis not present

## 2018-05-04 DIAGNOSIS — R634 Abnormal weight loss: Secondary | ICD-10-CM | POA: Diagnosis present

## 2018-05-04 DIAGNOSIS — R531 Weakness: Secondary | ICD-10-CM | POA: Diagnosis not present

## 2018-05-04 DIAGNOSIS — Z792 Long term (current) use of antibiotics: Secondary | ICD-10-CM

## 2018-05-04 LAB — TROPONIN I

## 2018-05-04 LAB — CBC WITH DIFFERENTIAL/PLATELET
BASOS ABS: 0 10*3/uL (ref 0.0–0.1)
BASOS PCT: 1 %
EOS ABS: 0 10*3/uL (ref 0.0–0.7)
Eosinophils Relative: 0 %
HEMATOCRIT: 35.4 % — AB (ref 36.0–46.0)
Hemoglobin: 11.5 g/dL — ABNORMAL LOW (ref 12.0–15.0)
Lymphocytes Relative: 13 %
Lymphs Abs: 0.9 10*3/uL (ref 0.7–4.0)
MCH: 29.7 pg (ref 26.0–34.0)
MCHC: 32.5 g/dL (ref 30.0–36.0)
MCV: 91.5 fL (ref 78.0–100.0)
MONO ABS: 0.4 10*3/uL (ref 0.1–1.0)
MONOS PCT: 6 %
NEUTROS ABS: 5.2 10*3/uL (ref 1.7–7.7)
NEUTROS PCT: 80 %
Platelets: 209 10*3/uL (ref 150–400)
RBC: 3.87 MIL/uL (ref 3.87–5.11)
RDW: 14.4 % (ref 11.5–15.5)
WBC: 6.5 10*3/uL (ref 4.0–10.5)

## 2018-05-04 LAB — BASIC METABOLIC PANEL
ANION GAP: 11 (ref 5–15)
BUN: 13 mg/dL (ref 8–23)
CALCIUM: 10.2 mg/dL (ref 8.9–10.3)
CO2: 37 mmol/L — ABNORMAL HIGH (ref 22–32)
CREATININE: 0.49 mg/dL (ref 0.44–1.00)
Chloride: 85 mmol/L — ABNORMAL LOW (ref 98–111)
Glucose, Bld: 91 mg/dL (ref 70–99)
Potassium: 4.3 mmol/L (ref 3.5–5.1)
SODIUM: 133 mmol/L — AB (ref 135–145)

## 2018-05-04 MED ORDER — TIOTROPIUM BROMIDE MONOHYDRATE 18 MCG IN CAPS
18.0000 ug | ORAL_CAPSULE | Freq: Every day | RESPIRATORY_TRACT | Status: DC
  Administered 2018-05-05 – 2018-05-06 (×2): 18 ug via RESPIRATORY_TRACT
  Filled 2018-05-04: qty 5

## 2018-05-04 MED ORDER — DOCUSATE SODIUM 100 MG PO CAPS
100.0000 mg | ORAL_CAPSULE | Freq: Two times a day (BID) | ORAL | Status: DC
  Administered 2018-05-04 – 2018-05-06 (×4): 100 mg via ORAL
  Filled 2018-05-04 (×4): qty 1

## 2018-05-04 MED ORDER — ACETAMINOPHEN 650 MG RE SUPP: 650.0000 mg | Freq: Four times a day (QID) | RECTAL | Status: DC | PRN

## 2018-05-04 MED ORDER — ACETAMINOPHEN 325 MG PO TABS
650.0000 mg | ORAL_TABLET | Freq: Four times a day (QID) | ORAL | Status: DC | PRN
  Administered 2018-05-05 – 2018-05-06 (×2): 650 mg via ORAL
  Filled 2018-05-04 (×2): qty 2

## 2018-05-04 MED ORDER — ORAL CARE MOUTH RINSE: 15.0000 mL | Freq: Two times a day (BID) | OROMUCOSAL | Status: DC

## 2018-05-04 MED ORDER — PNEUMOCOCCAL VAC POLYVALENT 25 MCG/0.5ML IJ INJ
0.5000 mL | INJECTION | Freq: Once | INTRAMUSCULAR | Status: AC
  Administered 2018-05-05: 0.5 mL via INTRAMUSCULAR
  Filled 2018-05-04: qty 0.5

## 2018-05-04 MED ORDER — IOPAMIDOL (ISOVUE-370) INJECTION 76%
75.0000 mL | Freq: Once | INTRAVENOUS | Status: AC | PRN
  Administered 2018-05-04: 75 mL via INTRAVENOUS

## 2018-05-04 MED ORDER — ESCITALOPRAM OXALATE 10 MG PO TABS
10.0000 mg | ORAL_TABLET | Freq: Every day | ORAL | Status: DC
  Administered 2018-05-04 – 2018-05-06 (×3): 10 mg via ORAL
  Filled 2018-05-04 (×3): qty 1

## 2018-05-04 MED ORDER — SODIUM CHLORIDE 0.9% FLUSH
3.0000 mL | INTRAVENOUS | Status: DC | PRN
  Administered 2018-05-05: 3 mL via INTRAVENOUS
  Filled 2018-05-04: qty 3

## 2018-05-04 MED ORDER — ENOXAPARIN SODIUM 30 MG/0.3ML ~~LOC~~ SOLN
30.0000 mg | SUBCUTANEOUS | Status: DC
  Administered 2018-05-04 – 2018-05-05 (×2): 30 mg via SUBCUTANEOUS
  Filled 2018-05-04 (×2): qty 0.3

## 2018-05-04 MED ORDER — PNEUMOCOCCAL VAC POLYVALENT 25 MCG/0.5ML IJ INJ: 0.5000 mL | INJECTION | INTRAMUSCULAR | Status: DC

## 2018-05-04 MED ORDER — PANTOPRAZOLE SODIUM 40 MG PO TBEC
40.0000 mg | DELAYED_RELEASE_TABLET | Freq: Every day | ORAL | Status: DC
  Administered 2018-05-04 – 2018-05-06 (×3): 40 mg via ORAL
  Filled 2018-05-04 (×3): qty 1

## 2018-05-04 MED ORDER — KETOROLAC TROMETHAMINE 15 MG/ML IJ SOLN
15.0000 mg | Freq: Four times a day (QID) | INTRAMUSCULAR | Status: DC | PRN
  Administered 2018-05-05: 15 mg via INTRAVENOUS
  Filled 2018-05-04: qty 1

## 2018-05-04 MED ORDER — METHYLPREDNISOLONE SODIUM SUCC 125 MG IJ SOLR
80.0000 mg | Freq: Three times a day (TID) | INTRAMUSCULAR | Status: DC
  Administered 2018-05-04 – 2018-05-06 (×6): 80 mg via INTRAVENOUS
  Filled 2018-05-04 (×6): qty 2

## 2018-05-04 MED ORDER — SODIUM CHLORIDE 0.9 % IV SOLN: 250.0000 mL | INTRAVENOUS | Status: DC | PRN

## 2018-05-04 MED ORDER — GABAPENTIN 100 MG PO CAPS
100.0000 mg | ORAL_CAPSULE | Freq: Every day | ORAL | Status: DC
  Administered 2018-05-04 – 2018-05-06 (×3): 100 mg via ORAL
  Filled 2018-05-04 (×3): qty 1

## 2018-05-04 MED ORDER — AMLODIPINE BESYLATE 5 MG PO TABS
5.0000 mg | ORAL_TABLET | Freq: Every day | ORAL | Status: DC
  Administered 2018-05-04 – 2018-05-06 (×3): 5 mg via ORAL
  Filled 2018-05-04 (×3): qty 1

## 2018-05-04 MED ORDER — MOMETASONE FURO-FORMOTEROL FUM 200-5 MCG/ACT IN AERO
2.0000 | INHALATION_SPRAY | Freq: Two times a day (BID) | RESPIRATORY_TRACT | Status: DC
  Administered 2018-05-04 – 2018-05-06 (×4): 2 via RESPIRATORY_TRACT
  Filled 2018-05-04 (×2): qty 8.8

## 2018-05-04 MED ORDER — SODIUM CHLORIDE 0.9 % IV BOLUS
500.0000 mL | Freq: Once | INTRAVENOUS | Status: AC
  Administered 2018-05-04: 500 mL via INTRAVENOUS

## 2018-05-04 MED ORDER — ADULT MULTIVITAMIN W/MINERALS CH
1.0000 | ORAL_TABLET | Freq: Every day | ORAL | Status: DC
  Administered 2018-05-04 – 2018-05-06 (×3): 1 via ORAL
  Filled 2018-05-04 (×3): qty 1

## 2018-05-04 MED ORDER — ATORVASTATIN CALCIUM 20 MG PO TABS
20.0000 mg | ORAL_TABLET | Freq: Every day | ORAL | Status: DC
  Administered 2018-05-04 – 2018-05-05 (×2): 20 mg via ORAL
  Filled 2018-05-04 (×2): qty 1

## 2018-05-04 MED ORDER — SODIUM CHLORIDE 0.9% FLUSH
3.0000 mL | Freq: Two times a day (BID) | INTRAVENOUS | Status: DC
  Administered 2018-05-04 – 2018-05-06 (×4): 3 mL via INTRAVENOUS

## 2018-05-04 MED ORDER — FLUTICASONE PROPIONATE 50 MCG/ACT NA SUSP
2.0000 | Freq: Every day | NASAL | Status: DC
  Administered 2018-05-04 – 2018-05-06 (×3): 2 via NASAL
  Filled 2018-05-04: qty 16

## 2018-05-04 MED ORDER — ALPRAZOLAM 0.5 MG PO TABS
0.5000 mg | ORAL_TABLET | Freq: Two times a day (BID) | ORAL | Status: DC | PRN
  Administered 2018-05-04 – 2018-05-06 (×4): 0.5 mg via ORAL
  Filled 2018-05-04 (×4): qty 1

## 2018-05-04 MED ORDER — ALBUTEROL SULFATE (2.5 MG/3ML) 0.083% IN NEBU
2.5000 mg | INHALATION_SOLUTION | RESPIRATORY_TRACT | Status: DC | PRN
  Administered 2018-05-05 (×2): 2.5 mg via RESPIRATORY_TRACT
  Filled 2018-05-04 (×2): qty 3

## 2018-05-04 NOTE — ED Triage Notes (Signed)
Pt called EMS from home after complaint of weakness sionce last night  Pt has Hx COPD, is on home O2 3L

## 2018-05-04 NOTE — ED Notes (Signed)
Pt returned from xray

## 2018-05-04 NOTE — ED Notes (Signed)
Pt taken to ct 

## 2018-05-04 NOTE — ED Notes (Signed)
Per Dr. Melina Copa - patient given water.

## 2018-05-04 NOTE — ED Notes (Signed)
Patient transported to X-ray 

## 2018-05-04 NOTE — H&P (Signed)
History and Physical    Lindsey Combs TMH:[id number] DOB: [birth year] DOA: 05/04/2018  Referring MD/NP/PA: Aletta Edouard, EDP PCP: Lindsey Gravel, MD  Patient coming from: Home  Chief Complaint: Shortness of breath  HPI: Lindsey Combs is a 72 y.o. female with multiple medical comorbidities including COPD, chronic hypoxemic respiratory failure on baseline 3 L of oxygen, head and neck cancer, specifically of the uvula with a PET scan in April that showed a solitary lung mass suspicious for metastasis, she was deemed to not be a candidate for biopsy and when she was advised to undergo radiation she refused among other issues.  She comes to the hospital today with shortness of breath that has been ongoing for the past 24 hours.  She is visibly short of breath with increased work of breathing on exam and unable to complete full sentences.  Vital signs are within normal limits, of note she is saturating in the lower to mid 90s on her baseline 3 L of oxygen, troponin x2 was less than 0.03, labs are basically within normal limits, chest x-ray without active cardiopulmonary disease but advanced COPD, there is a compression fracture of a mid thoracic vertebra that is new since prior radiographs.  Patient subsequently had a CT angiogram of the chest that was negative for PE, pneumonia or other acute cardiopulmonary process.  There was a new T7 compression fracture, stable posterior right upper lobe cavitary nodule versus focus of pleuroparenchymal scarring, there is also mention of COPD with advanced emphysema.  We are asked to admit her for further evaluation and management of her respiratory distress.  Past Medical/Surgical History: Past Medical History:  Diagnosis Date  . Acute respiratory failure with hypoxia ([place])   . Anxiety   . Cancer (HCC)    uvular per pt  . COPD (chronic obstructive pulmonary disease) ([place])   . DDD (degenerative disc disease), lumbar   . Degenerative joint disease of spine   .  Diverticulosis   . GERD (gastroesophageal reflux disease)   . H. pylori infection    treated 02/2013.  [name] History of UTI   . Hypercholesterolemia   . Hypertension   . Hyponatremia   . Peripheral neuropathy   . Shortness of breath    with exertion  . Tobacco abuse     Past Surgical History:  Procedure Laterality Date  . ABDOMINAL EXPLORATION SURGERY     age 71, large ovarian cyst  . BIOPSY N/A 03/01/2013   XQJ:JHER hiatal hernia; otherwise, normal examination/ Status post biopsies as described above. SB bx negative for Celiac. +h/pylori  . CATARACT EXTRACTION W/ INTRAOCULAR LENS  IMPLANT, BILATERAL    . COLONOSCOPY  10/16/09   Jenkins:3 polypsin the sigmoid colon/polyps in the descending colon and the rectum/scattered diverticulum. hyperplastic  . COLONOSCOPY WITH ESOPHAGOGASTRODUODENOSCOPY (EGD) N/A 03/01/2013   DEY:CXKGYJEH colonic polyps - treated/removed as described above. Colonic diverticulosis. Hyperplastic. Next TCS 02/2018.  [name] DIRECT LARYNGOSCOPY N/A 06/08/2016   Procedure: DIRECT LARYNGOSCOPY AND BIOPSY;  Surgeon: Leta Baptist, MD;  Location: [facility] OR;  Service: ENT;  Laterality: N/A;  . PARTIAL HYSTERECTOMY     age 78  . UVULECTOMY      Social History:  reports that she has been smoking cigarettes. She has a 75.00 pack-year smoking history. She has never used smokeless tobacco. She reports that she drinks alcohol. She reports that she does not use drugs.  Allergies: Allergies  Allergen Reactions  . Prednisone Nausea Only  . Chantix [Varenicline] Other (See Comments)  Mental status changes  . Codeine Itching    Family History:  Family History  Problem Relation Age of Onset  . Leukemia Father   . Thyroid disease Father   . Stomach cancer Paternal Grandfather   . Colon cancer Neg Hx     Prior to Admission medications   Medication Sig Start Date End Date Taking? Authorizing Provider  acetaminophen (TYLENOL) 500 MG tablet Take 500 mg by mouth every 6 (six) hours as needed  for mild pain.   Yes [provider]  albuterol (PROAIR HFA) 108 (90 BASE) MCG/ACT inhaler Inhale 2 puffs into the lungs every 4 (four) hours as needed for wheezing or shortness of breath. 04/06/15  Yes Black, Lezlie Octave, NP  ALPRAZolam Duanne Moron) 0.5 MG tablet Take 1 tablet (0.5 mg total) by mouth 2 (two) times daily as needed for anxiety. 02/23/17  Yes Isaac Bliss, Rayford Halsted, MD  amLODipine (NORVASC) 5 MG tablet TAKE 1 TABLET BY MOUTH ONCE A DAY. 01/16/17  Yes Kefalas, Manon Hilding, PA-C  atorvastatin (LIPITOR) 20 MG tablet Take 20 mg by mouth daily.   Yes [provider]  escitalopram (LEXAPRO) 10 MG tablet Take 10 mg by mouth daily.  06/02/17  Yes [provider]  fluticasone (FLONASE) 50 MCG/ACT nasal spray Place 2 sprays into both nostrils daily as needed.  04/10/18  Yes [provider]  gabapentin (NEURONTIN) 100 MG capsule Take 1 capsule by mouth daily. 01/23/17  Yes [provider]  Melatonin 5 MG TABS Take 5 mg by mouth at bedtime as needed.   Yes [provider]  omeprazole (PRILOSEC) 20 MG capsule Take 1 capsule by mouth daily. 02/13/17  Yes [provider]  SYMBICORT 160-4.5 MCG/ACT inhaler Inhale 2 puffs into the lungs 2 (two) times daily. 08/29/16  Yes [provider]  tiotropium (SPIRIVA) 18 MCG inhalation capsule Place 18 mcg into inhaler and inhale daily as needed. Shortness of breath   Yes [provider]  cefdinir (OMNICEF) 300 MG capsule Take 300 mg by mouth 2 (two) times daily.  03/13/18   [provider]  ciprofloxacin (CIPRO) 250 MG tablet  03/09/18   [provider]    Review of Systems:  Constitutional: Denies fever, chills, diaphoresis, appetite change and fatigue.  HEENT: Denies photophobia, eye pain, redness, hearing loss, ear pain, congestion, sore throat, rhinorrhea, sneezing, mouth sores, trouble swallowing, neck pain, neck stiffness and tinnitus.   Respiratory: Denies chest tightness,   and wheezing.  Positive for shortness of breath and chronic productive cough Cardiovascular: Denies chest pain, palpitations and leg swelling.  Gastrointestinal: Denies nausea, vomiting, abdominal pain, diarrhea, constipation, blood in stool and abdominal distention.  Genitourinary: Denies dysuria, urgency, frequency, hematuria, flank pain and difficulty urinating.  Endocrine: Denies: hot or cold intolerance, sweats, changes in hair or nails, polyuria, polydipsia. Musculoskeletal: Denies myalgias, back pain, joint swelling, arthralgias and gait problem.  Skin: Denies pallor, rash and wound.  Neurological: Denies dizziness, seizures, syncope, weakness, light-headedness, numbness and headaches.  Hematological: Denies adenopathy. Easy bruising, personal or family bleeding history  Psychiatric/Behavioral: Denies suicidal ideation, mood changes, confusion, nervousness, sleep disturbance and agitation    Physical Exam: Vitals:   05/04/18 1530 05/04/18 1545 05/04/18 1600 05/04/18 1615  BP: 140/74  (!) 159/59   Pulse: 85 89 88 80  Resp: 20 (!) 21 18 17   Temp:      TempSrc:      SpO2: 99% 98% 99% 98%     Constitutional: NAD, calm,  comfortable, very cachectic with temporal wasting Eyes: PERRL, lids and conjunctivae normal ENMT: Mucous membranes are moist. Posterior pharynx clear of any exudate or lesions.poor dentition.  Neck: normal, supple, no masses, no thyromegaly Respiratory: Increased work of breathing, no accessory muscle use, increased respiratory effort, poor air movement, no wheezes or crackles auscultated  cardiovascular: Regular rate and rhythm, no murmurs / rubs / gallops. No extremity edema. 2+ pedal pulses. No carotid bruits.  Abdomen: no tenderness, no masses palpated. No hepatosplenomegaly. Bowel sounds positive.  Musculoskeletal: no clubbing / cyanosis. No joint deformity upper and lower extremities. Good ROM, no contractures. Normal muscle tone.  Skin: no rashes, lesions,  ulcers. No induration Neurologic: CN 2-12 grossly intact. Sensation intact, DTR normal. Strength 5/5 in all 4.  Psychiatric: Normal judgment and insight. Alert and oriented x 3. Normal mood.    Labs on Admission: I have personally reviewed the following labs and imaging studies  CBC: Recent Labs  Lab 05/04/18 1253  WBC 6.5  NEUTROABS 5.2  HGB 11.5*  HCT 35.4*  MCV 91.5  PLT 841   Basic Metabolic Panel: Recent Labs  Lab 05/04/18 1253  NA 133*  K 4.3  CL 85*  CO2 37*  GLUCOSE 91  BUN 13  CREATININE 0.49  CALCIUM 10.2   GFR: CrCl cannot be calculated (Unknown ideal weight.). Liver Function Tests: No results for input(s): AST, ALT, ALKPHOS, BILITOT, PROT, ALBUMIN in the last 168 hours. No results for input(s): LIPASE, AMYLASE in the last 168 hours. No results for input(s): AMMONIA in the last 168 hours. Coagulation Profile: No results for input(s): INR, PROTIME in the last 168 hours. Cardiac Enzymes: Recent Labs  Lab 05/04/18 1253 05/04/18 1553  TROPONINI <0.03 <0.03   BNP (last 3 results) No results for input(s): PROBNP in the last 8760 hours. HbA1C: No results for input(s): HGBA1C in the last 72 hours. CBG: No results for input(s): GLUCAP in the last 168 hours. Lipid Profile: No results for input(s): CHOL, HDL, LDLCALC, TRIG, CHOLHDL, LDLDIRECT in the last 72 hours. Thyroid Function Tests: No results for input(s): TSH, T4TOTAL, FREET4, T3FREE, THYROIDAB in the last 72 hours. Anemia Panel: No results for input(s): VITAMINB12, FOLATE, FERRITIN, TIBC, IRON, RETICCTPCT in the last 72 hours. Urine analysis:    Component Value Date/Time   COLORURINE YELLOW 09/21/2016 0246   APPEARANCEUR CLEAR 09/21/2016 0246   LABSPEC 1.013 09/21/2016 0246   PHURINE 6.0 09/21/2016 0246   GLUCOSEU NEGATIVE 09/21/2016 0246   HGBUR MODERATE (A) 09/21/2016 0246   BILIRUBINUR NEGATIVE 09/21/2016 0246   KETONESUR 5 (A) 09/21/2016 0246   PROTEINUR 100 (A) 09/21/2016 0246    UROBILINOGEN 0.2 04/04/2015 1918   NITRITE NEGATIVE 09/21/2016 0246   LEUKOCYTESUR NEGATIVE 09/21/2016 0246   Sepsis Labs: @LABRCNTIP (procalcitonin:4,lacticidven:4) )No results found for this or any previous visit (from the past 240 hour(s)).   Radiological Exams on Admission: Dg Chest 2 View  Result Date: 05/04/2018 CLINICAL DATA:  Weakness beginning last night. SOB increasing. Hx of COPD, acute respiratory failure, on home O2, COPD, HTN. Current smoker. EXAM: CHEST - 2 VIEW COMPARISON:  02/21/2017 FINDINGS: Cardiac silhouette is borderline enlarged. No mediastinal or hilar masses or evidence of adenopathy. Marked lung hyperexpansion. Mild interstitial thickening noted most evident in the bases. No evidence of pneumonia or pulmonary edema. No pleural effusion or pneumothorax. Moderate compression fracture of a midthoracic vertebra, new since the prior exam. Skeletal structures otherwise intact. IMPRESSION: 1. No acute cardiopulmonary disease. 2. Advanced COPD. 3. Moderate chronic compression  fracture of a midthoracic vertebra new since the prior radiographs. Electronically Signed   By: Lajean Manes M.D.   On: 05/04/2018 13:19   Ct Angio Chest Pe W/cm &/or Wo Cm  Result Date: 05/04/2018 CLINICAL DATA:  72 year old female with COPD on home oxygen with new onset weakness. EXAM: CT ANGIOGRAPHY CHEST WITH CONTRAST TECHNIQUE: Multidetector CT imaging of the chest was performed using the standard protocol during bolus administration of intravenous contrast. Multiplanar CT image reconstructions and MIPs were obtained to evaluate the vascular anatomy. CONTRAST:  16mL ISOVUE-370 IOPAMIDOL (ISOVUE-370) INJECTION 76% COMPARISON:  Chest x-ray obtained earlier today; prior CT scan of the chest 07/01/2016; prior PET-CT 12/25/2017 FINDINGS: Cardiovascular: Adequate opacification of the pulmonary arteries. No evidence of central filling defect to suggest acute pulmonary embolus. Main pulmonary artery remains normal in  size. The heart is normal in size. Calcifications present along the coronary arteries. Conventional 3 vessel aortic arch anatomy. No evidence of aneurysm or dissection. Mild atherosclerotic plaque. Mediastinum/Nodes: Unremarkable CT appearance of the thyroid gland. No suspicious mediastinal or hilar adenopathy. No soft tissue mediastinal mass. The thoracic esophagus is unremarkable. Lungs/Pleura: Advanced centrilobular emphysema and pulmonary hyperinflation again noted. No suspicious pulmonary nodule or mass. Focus of pleuroparenchymal scarring along the posterior aspect of the right upper lobe is slightly more prominent than previously noted at 1.4 x 1.2 cm. No significant change compared to PET-CT imaging from 12/25/2017. Mild bronchial wall thickening. Several bronchi in the right lower lobe are impacted with secretions. No pneumothorax or pleural effusion. Upper Abdomen: No acute abnormality within the visualized upper abdomen. Old granulomatous disease as evidenced by punctate calcifications in the spleen. Musculoskeletal: T7 compression fracture with approximately 60% height loss anterior. This is a new finding compared to 07/01/2016 Review of the MIP images confirms the above findings. IMPRESSION: 1. Negative for acute pulmonary embolus, pneumonia or other acute cardiopulmonary process. 2. New T7 compression fracture with approximately 60% height loss anteriorly. This represents a new finding compared to 02/11/2017 and may be acute, or subacute. 3. Stable posterior right upper lobe cavitary nodule versus focus of pleuroparenchymal scarring. 4. COPD with advanced emphysema. 5. Coronary artery calcifications. 6.  Aortic Atherosclerosis (ICD10-170.0). Aortic Atherosclerosis (ICD10-I70.0) and Emphysema (ICD10-J43.9). Electronically Signed   By: Jacqulynn Cadet M.D.   On: 05/04/2018 15:53    EKG: Independently reviewed.  Anterior J-point elevation, sinus rhythm, right atrial  enlargement  Assessment/Plan Principal Problem:   Acute on chronic respiratory failure with hypoxia (HCC) Active Problems:   COPD exacerbation (HCC)   Abnormal weight loss   GERD (gastroesophageal reflux disease)   Tobacco dependence   Tobacco use   Hyperlipidemia   Hypertension   Carcinoma of uvula (HCC)   Severe protein-calorie malnutrition (HCC)    Acute on chronic hypoxemic respiratory failure -Due to COPD with acute exacerbation -Will admit to MedSurg, place on IV steroids, nebs, continue Dulera and Spiriva that she takes at home. -Continue baseline 3 L of oxygen, she is saturating well on this.  Tobacco abuse -I have discussed tobacco cessation with the patient.  I have counseled the patient regarding the negative impacts of continued tobacco use including but not limited to lung cancer, COPD, and cardiovascular disease.  I have discussed alternatives to tobacco and modalities that may help facilitate tobacco cessation including but not limited to biofeedback, hypnosis, and medications.  Total time spent with tobacco counseling was 4 minutes.  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  Severe protein caloric malnutrition -Patient is severely cachectic with temporal wasting and has dropped from 147 pounds to 80. -Request dietitian evaluation. -Multivitamin.     DVT prophylaxis: Lovenox Code Status: Full code Family Communication: Patient only Disposition Plan: Home pending medical stability Consults called: None Admission status: Admit - It is my clinical opinion that admission to INPATIENT is reasonable and necessary because of the expectation that this patient will require hospital care that crosses at least 2 midnights to treat this condition based on the medical complexity of the problems presented.  Given the aforementioned information,  the predictability of an adverse outcome is felt to be significant.      Time Spent: 85 minutes  Estela Isaac Bliss MD [facility] Pager [phone]  If 7PM-7AM, please contact night-coverage [email or web address] Password [email or web address]  05/04/2018, 5:13 PM

## 2018-05-04 NOTE — ED Notes (Signed)
Pt returned from ct

## 2018-05-04 NOTE — ED Provider Notes (Signed)
[facility] EMERGENCY DEPARTMENT Provider Note   CSN: [id number] Arrival date & time: 05/04/18  1214     History   Chief Complaint Chief Complaint  Patient presents with  . Weakness  . Shortness of Breath    HPI Lindsey Combs is a 72 y.o. female.  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  She states she has had increased shortness of breath since yesterday and last night she was having trouble clearing the mucus out of her throat.  It caused her to feel extremely weak and she lowered herself to the ground where she was there since last night.  She denies any injury.  Said that she has not eaten or drank anything.  She has been losing weight since she had her throat surgery and she relates that they think she might have something in her lungs that she needs radiation for but she has been too weak to undergo anything.  She denies any abdominal pain nausea vomiting diarrhea.  She states she is been incontinent of urine, but no dysuria.  The history is provided by the patient.  Weakness  Primary symptoms include no focal weakness, no speech change. This is a new problem. The current episode started yesterday. The problem has not changed since onset.There was no focality noted. There has been no fever. Associated symptoms include shortness of breath. Pertinent negatives include no chest pain, no vomiting, no altered mental status, no confusion and no headaches.  Shortness of Breath  This is a recurrent problem. The average episode lasts 2 days. The problem occurs frequently.The current episode started yesterday. The problem has been gradually improving. Associated symptoms include cough and sputum production. Pertinent negatives include no fever, no headaches, no neck pain, no hemoptysis, no chest pain, no vomiting, no abdominal pain, no rash and no leg swelling. It is unknown what precipitated the problem. She has tried nothing for the symptoms.    Past  Medical History:  Diagnosis Date  . Acute respiratory failure with hypoxia ([place])   . Anxiety   . Cancer (HCC)    uvular per pt  . COPD (chronic obstructive pulmonary disease) ([place])   . DDD (degenerative disc disease), lumbar   . Degenerative joint disease of spine   . Diverticulosis   . GERD (gastroesophageal reflux disease)   . H. pylori infection    treated 02/2013.  [name] History of UTI   . Hypercholesterolemia   . Hypertension   . Hyponatremia   . Peripheral neuropathy   . Shortness of breath    with exertion  . Tobacco abuse     Patient Active Problem List   Diagnosis Date Noted  . Closed right hip fracture ([place]) greater Trochanter 07/13/17 07/13/2017  . Vomiting and diarrhea 07/13/2017  . Fall at home, initial encounter 07/13/2017  . Hip fracture ([place]) 07/13/2017  . COPD with acute exacerbation ([place]) 02/16/2017  . Tobacco abuse 02/16/2017  . Malnutrition of moderate degree 09/22/2016  . Hypomagnesemia 09/21/2016  . Tobacco use 08/03/2016  . Hyperlipidemia 08/03/2016  . Hypertension 08/03/2016  . History of Helicobacter pylori infection 08/03/2016  . History of anemia 08/03/2016  . Diverticulosis 08/03/2016  . Arthritis 08/03/2016  . 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 Chronic obstructive pulmonary disease with acute exacerbation ([place])   . Acute respiratory failure with hypoxia ([place]) 04/03/2015  . COPD exacerbation ([place]) 04/03/2015  . Hyponatremia 04/03/2015  . Dehydration 04/03/2015  . Hyperglycemia  04/03/2015  . Anxiety   . COPD (chronic obstructive pulmonary disease) ([place])   . Peripheral neuropathy ([place])   . Tobacco dependence   . Back pain, chronic 04/19/2013  . Abnormal weight loss 02/19/2013  . Anemia, iron deficiency 02/19/2013  . GERD (gastroesophageal reflux disease) 02/19/2013    Past Surgical History:  Procedure Laterality Date  . ABDOMINAL EXPLORATION SURGERY     age 12, large ovarian cyst  . BIOPSY N/A 03/01/2013   WNU:UVOZ hiatal hernia;  otherwise, normal examination/ Status post biopsies as described above. SB bx negative for Celiac. +h/pylori  . CATARACT EXTRACTION W/ INTRAOCULAR LENS  IMPLANT, BILATERAL    . COLONOSCOPY  10/16/09   Jenkins:3 polypsin the sigmoid colon/polyps in the descending colon and the rectum/scattered diverticulum. hyperplastic  . COLONOSCOPY WITH ESOPHAGOGASTRODUODENOSCOPY (EGD) N/A 03/01/2013   DGU:[id number] colonic polyps - treated/removed as described above. Colonic diverticulosis. Hyperplastic. Next TCS 02/2018.  [name] DIRECT LARYNGOSCOPY N/A 06/08/2016   Procedure: DIRECT LARYNGOSCOPY AND BIOPSY;  Surgeon: Leta Baptist, MD;  Location: [facility] OR;  Service: ENT;  Laterality: N/A;  . PARTIAL HYSTERECTOMY     age 51  . UVULECTOMY       OB History    Gravida  5   Para  4   Term  4   Preterm      AB  1   Living        SAB  1   TAB      Ectopic      Multiple      Live Births               Home Medications    Prior to Admission medications   Medication Sig Start Date End Date Taking? Authorizing Provider  acetaminophen (TYLENOL) 500 MG tablet Take 500 mg by mouth every 6 (six) hours as needed for mild pain.    [provider]  albuterol (PROAIR HFA) 108 (90 BASE) MCG/ACT inhaler Inhale 2 puffs into the lungs every 4 (four) hours as needed for wheezing or shortness of breath. 04/06/15   Black, Lezlie Octave, NP  ALPRAZolam Duanne Moron) 0.5 MG tablet Take 1 tablet (0.5 mg total) by mouth 2 (two) times daily as needed for anxiety. 02/23/17   Isaac Bliss, Rayford Halsted, MD  amLODipine (NORVASC) 5 MG tablet TAKE 1 TABLET BY MOUTH ONCE A DAY. 01/16/17   Baird Cancer, PA-C  atorvastatin (LIPITOR) 20 MG tablet Take 20 mg by mouth daily.    [provider]  escitalopram (LEXAPRO) 10 MG tablet Take 10 mg by mouth daily.  06/02/17   [provider]  gabapentin (NEURONTIN) 100 MG capsule Take 1 capsule by mouth daily. 01/23/17   [provider]  Melatonin 5 MG TABS Take 5 mg by  mouth at bedtime as needed.    [provider]  methocarbamol (ROBAXIN) 500 MG tablet Take 1 tablet (500 mg total) by mouth every 8 (eight) hours as needed for muscle spasms. 07/14/17   Kathie Dike, MD  omeprazole (PRILOSEC) 20 MG capsule Take 1 capsule by mouth daily. 02/13/17   [provider]  SYMBICORT 160-4.5 MCG/ACT inhaler Inhale 2 puffs into the lungs 2 (two) times daily. 08/29/16   [provider]  tiotropium (SPIRIVA) 18 MCG inhalation capsule Place 18 mcg into inhaler and inhale daily as needed. Shortness of breath    [provider]    Family History Family History  Problem Relation Age of Onset  . Leukemia Father   .  Thyroid disease Father   . Stomach cancer Paternal Grandfather   . Colon cancer Neg Hx     Social History Social History   Tobacco Use  . Smoking status: Current Every Day Smoker    Packs/day: 1.50    Years: 50.00    Pack years: 75.00    Types: Cigarettes  . Smokeless tobacco: Never Used  Substance Use Topics  . Alcohol use: Yes    Comment: glass of wine occasionally  . Drug use: No     Allergies   Prednisone; Chantix [varenicline]; and Codeine   Review of Systems Review of Systems  Constitutional: Positive for fatigue. Negative for chills and fever.  HENT: Negative for nosebleeds and sneezing.   Eyes: Negative for visual disturbance.  Respiratory: Positive for cough, sputum production and shortness of breath. Negative for hemoptysis.   Cardiovascular: Negative for chest pain and leg swelling.  Gastrointestinal: Negative for abdominal pain, nausea and vomiting.  Genitourinary: Negative for dysuria.  Musculoskeletal: Negative for neck pain.  Skin: Negative for rash.  Neurological: Positive for weakness and light-headedness. Negative for speech change, focal weakness and headaches.  Psychiatric/Behavioral: Negative for confusion.     Physical Exam Updated Vital Signs BP (!) 117/57 (BP Location: Right  Arm)   Pulse 89   Temp 98.5 F (36.9 C) (Oral)   Resp 18   SpO2 98%   Physical Exam  Constitutional: Vital signs are normal. She appears well-developed and well-nourished. She appears cachectic.  Non-toxic appearance. No distress.  HENT:  Head: Normocephalic and atraumatic.  Eyes: Conjunctivae are normal.  Neck: Neck supple.  Cardiovascular: Normal rate, regular rhythm, normal heart sounds and intact distal pulses.  No murmur heard. Pulmonary/Chest: Breath sounds normal. Accessory muscle usage present. No respiratory distress.  Abdominal: Soft. She exhibits no mass. There is no tenderness.  Musculoskeletal: Normal range of motion. She exhibits no edema.       Right lower leg: She exhibits no edema.       Left lower leg: She exhibits no edema.  Neurological: She is alert.  Skin: Skin is warm and dry. Capillary refill takes less than 2 seconds.  Psychiatric: She has a normal mood and affect.  Nursing note and vitals reviewed.    ED Treatments / Results  Labs (all labs ordered are listed, but only abnormal results are displayed) Labs Reviewed  CBC WITH DIFFERENTIAL/PLATELET - Abnormal; Notable for the following components:      Result Value   Hemoglobin 11.5 (*)    HCT 35.4 (*)    All other components within normal limits  BASIC METABOLIC PANEL - Abnormal; Notable for the following components:   Sodium 133 (*)    Chloride 85 (*)    CO2 37 (*)    All other components within normal limits  BASIC METABOLIC PANEL - Abnormal; Notable for the following components:   Chloride 92 (*)    CO2 37 (*)    Glucose, Bld 141 (*)    Creatinine, Ser 0.40 (*)    All other components within normal limits  CBC - Abnormal; Notable for the following components:   WBC 3.0 (*)    RBC 3.50 (*)    Hemoglobin 10.4 (*)    HCT 32.0 (*)    All other components within normal limits  TROPONIN I  TROPONIN I    EKG EKG Interpretation  Date/Time:  Friday May 04 2018 13:47:46 EDT Ventricular  Rate:  77 PR Interval:  126 QRS Duration: 91  QT Interval:  404 QTC Calculation: 458 R Axis:   87 Text Interpretation:  Sinus rhythm Right atrial enlargement Borderline right axis deviation ST elevation, consider anterior injury Confirmed by Aletta Edouard [id number]) on 05/04/2018 1:51:24 PM   Radiology Dg Chest 2 View  Result Date: 05/04/2018 CLINICAL DATA:  Weakness beginning last night. SOB increasing. Hx of COPD, acute respiratory failure, on home O2, COPD, HTN. Current smoker. EXAM: CHEST - 2 VIEW COMPARISON:  02/21/2017 FINDINGS: Cardiac silhouette is borderline enlarged. No mediastinal or hilar masses or evidence of adenopathy. Marked lung hyperexpansion. Mild interstitial thickening noted most evident in the bases. No evidence of pneumonia or pulmonary edema. No pleural effusion or pneumothorax. Moderate compression fracture of a midthoracic vertebra, new since the prior exam. Skeletal structures otherwise intact. IMPRESSION: 1. No acute cardiopulmonary disease. 2. Advanced COPD. 3. Moderate chronic compression fracture of a midthoracic vertebra new since the prior radiographs. Electronically Signed   By: Lajean Manes M.D.   On: 05/04/2018 13:19   Ct Angio Chest Pe W/cm &/or Wo Cm  Result Date: 05/04/2018 CLINICAL DATA:  72 year old female with COPD on home oxygen with new onset weakness. EXAM: CT ANGIOGRAPHY CHEST WITH CONTRAST TECHNIQUE: Multidetector CT imaging of the chest was performed using the standard protocol during bolus administration of intravenous contrast. Multiplanar CT image reconstructions and MIPs were obtained to evaluate the vascular anatomy. CONTRAST:  48mL ISOVUE-370 IOPAMIDOL (ISOVUE-370) INJECTION 76% COMPARISON:  Chest x-ray obtained earlier today; prior CT scan of the chest 07/01/2016; prior PET-CT 12/25/2017 FINDINGS: Cardiovascular: Adequate opacification of the pulmonary arteries. No evidence of central filling defect to suggest acute pulmonary embolus. Main pulmonary  artery remains normal in size. The heart is normal in size. Calcifications present along the coronary arteries. Conventional 3 vessel aortic arch anatomy. No evidence of aneurysm or dissection. Mild atherosclerotic plaque. Mediastinum/Nodes: Unremarkable CT appearance of the thyroid gland. No suspicious mediastinal or hilar adenopathy. No soft tissue mediastinal mass. The thoracic esophagus is unremarkable. Lungs/Pleura: Advanced centrilobular emphysema and pulmonary hyperinflation again noted. No suspicious pulmonary nodule or mass. Focus of pleuroparenchymal scarring along the posterior aspect of the right upper lobe is slightly more prominent than previously noted at 1.4 x 1.2 cm. No significant change compared to PET-CT imaging from 12/25/2017. Mild bronchial wall thickening. Several bronchi in the right lower lobe are impacted with secretions. No pneumothorax or pleural effusion. Upper Abdomen: No acute abnormality within the visualized upper abdomen. Old granulomatous disease as evidenced by punctate calcifications in the spleen. Musculoskeletal: T7 compression fracture with approximately 60% height loss anterior. This is a new finding compared to 07/01/2016 Review of the MIP images confirms the above findings. IMPRESSION: 1. Negative for acute pulmonary embolus, pneumonia or other acute cardiopulmonary process. 2. New T7 compression fracture with approximately 60% height loss anteriorly. This represents a new finding compared to 02/11/2017 and may be acute, or subacute. 3. Stable posterior right upper lobe cavitary nodule versus focus of pleuroparenchymal scarring. 4. COPD with advanced emphysema. 5. Coronary artery calcifications. 6.  Aortic Atherosclerosis (ICD10-170.0). Aortic Atherosclerosis (ICD10-I70.0) and Emphysema (ICD10-J43.9). Electronically Signed   By: Jacqulynn Cadet M.D.   On: 05/04/2018 15:53    Procedures Procedures (including critical care time)  Medications Ordered in  ED Medications  sodium chloride 0.9 % bolus 500 mL (has no administration in time range)     Initial Impression / Assessment and Plan / ED Course  I have reviewed the triage vital signs and the nursing notes.  Pertinent labs &  imaging results that were available during my care of the patient were reviewed by me and considered in my medical decision making (see chart for details).  Clinical Course as of May 04 1525  Fri May 04, 2018  1412 Reviewed EKGs and story with Dr. Harrington Challenger from cardiology.  She agrees that the EKG is concerning and she is got known coronary disease based on CT showing a lot of coronary calcification.  She would recommend admitting the patient and ruling her out.   [MB]  1455 Troponin I [MB]  9450 Discussed with Dr. Jerilee Hoh from the hospitalist service.  She recommends that we get a second troponin now and she will come down and evaluate the patient to decide if she needs to be admitted.   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

## 2018-05-05 DIAGNOSIS — C052 Malignant neoplasm of uvula: Secondary | ICD-10-CM

## 2018-05-05 DIAGNOSIS — J441 Chronic obstructive pulmonary disease with (acute) exacerbation: Secondary | ICD-10-CM

## 2018-05-05 DIAGNOSIS — E43 Unspecified severe protein-calorie malnutrition: Secondary | ICD-10-CM

## 2018-05-05 DIAGNOSIS — K219 Gastro-esophageal reflux disease without esophagitis: Secondary | ICD-10-CM

## 2018-05-05 LAB — CBC
HCT: 32 % — ABNORMAL LOW (ref 36.0–46.0)
Hemoglobin: 10.4 g/dL — ABNORMAL LOW (ref 12.0–15.0)
MCH: 29.7 pg (ref 26.0–34.0)
MCHC: 32.5 g/dL (ref 30.0–36.0)
MCV: 91.4 fL (ref 78.0–100.0)
PLATELETS: 227 10*3/uL (ref 150–400)
RBC: 3.5 MIL/uL — AB (ref 3.87–5.11)
RDW: 14.7 % (ref 11.5–15.5)
WBC: 3 10*3/uL — ABNORMAL LOW (ref 4.0–10.5)

## 2018-05-05 LAB — BASIC METABOLIC PANEL
Anion gap: 7 (ref 5–15)
BUN: 12 mg/dL (ref 8–23)
CALCIUM: 9.6 mg/dL (ref 8.9–10.3)
CO2: 37 mmol/L — AB (ref 22–32)
Chloride: 92 mmol/L — ABNORMAL LOW (ref 98–111)
Creatinine, Ser: 0.4 mg/dL — ABNORMAL LOW (ref 0.44–1.00)
GFR calc non Af Amer: >60 mL/min (ref >60)
Glucose, Bld: 141 mg/dL — ABNORMAL HIGH (ref 70–99)
Potassium: 4 mmol/L (ref 3.5–5.1)
Sodium: 136 mmol/L (ref 135–145)

## 2018-05-05 MED ORDER — ONDANSETRON HCL 4 MG/2ML IJ SOLN
4.0000 mg | Freq: Four times a day (QID) | INTRAMUSCULAR | Status: DC | PRN
  Administered 2018-05-05: 4 mg via INTRAVENOUS
  Filled 2018-05-05: qty 2

## 2018-05-05 MED ORDER — NICOTINE 14 MG/24HR TD PT24
14.0000 mg | MEDICATED_PATCH | Freq: Every day | TRANSDERMAL | Status: DC
  Administered 2018-05-05 – 2018-05-06 (×2): 14 mg via TRANSDERMAL
  Filled 2018-05-05 (×2): qty 1

## 2018-05-05 MED ORDER — ENSURE ENLIVE PO LIQD
237.0000 mL | Freq: Two times a day (BID) | ORAL | Status: DC
  Administered 2018-05-05 – 2018-05-06 (×3): 237 mL via ORAL

## 2018-05-05 NOTE — Progress Notes (Signed)
PROGRESS NOTE    Lindsey Combs  QQP:[id number] DOB: December 27, 1945 DOA: 05/04/2018 PCP: [name], MD     Brief Narrative:  72 year old woman admitted from home on 8/9 due to increased shortness of breath.  She was thought to have COPD exacerbation and admission was requested for further evaluation and management.   Assessment & Plan:   Principal Problem:   Acute on chronic respiratory failure with hypoxia (HCC) Active Problems:   COPD exacerbation (HCC)   Abnormal weight loss   GERD (gastroesophageal reflux disease)   Tobacco dependence   Tobacco use   Hyperlipidemia   Hypertension   Carcinoma of uvula (HCC)   Severe protein-calorie malnutrition (HCC)   Acute on chronic hypoxemic respiratory failure -Due to COPD with acute exacerbation. -She is on her baseline 3 L of oxygen saturating in the mid to upper 90s. -She appears significantly improved today with no wheezing but much better air movement. -Continue steroids, nebs, Dulera, Spiriva.  Tobacco abuse -Patient was counseled on admission regarding tobacco cessation, will place nicotine patch at patient request.  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 -At this point will recommend outpatient oncology follow-up.    Severe protein caloric malnutrition -Continue daily multivitamin, appreciate nutrition recommendations.   DVT prophylaxis: Lovenox Code Status: Full code Family Communication: Patient only Disposition Plan: Anticipate home in 24 to 48 hours  Consultants:   None  Procedures:   None  Antimicrobials:  Anti-infectives (From admission, onward)   None       Subjective: Lying in bed, eating lunch, states she feels a little nauseous but is eating a hamburger with tater tots without any apparent difficulty.  States her shortness of breath is  improved, denies chest pain.  Objective: Vitals:   05/05/18 0609 05/05/18 0756 05/05/18 1320 05/05/18 1352  BP: (!) 128/57   (!) 130/58  Pulse: 72   88  Resp: 19   18  Temp: 98.5 F (36.9 C)   98.4 F (36.9 C)  TempSrc: Oral   Oral  SpO2: 100% 99% 96% 98%  Weight:      Height:        Intake/Output Summary (Last 24 hours) at 05/05/2018 1608 Last data filed at 05/05/2018 1530 Gross per 24 hour  Intake 720 ml  Output -  Net 720 ml   Filed Weights   05/04/18 1904  Weight: 33.8 kg    Examination:  General exam: Alert, awake, oriented x 3, severe cachexia with temporal wasting Respiratory system: Respiratory effort normal.  Improved air movement, no wheezes or crackles. Cardiovascular system:RRR. No murmurs, rubs, gallops. Gastrointestinal system: Abdomen is nondistended, soft and nontender. No organomegaly or masses felt. Normal bowel sounds heard. Central nervous system: Alert and oriented. No focal neurological deficits. Extremities: No C/C/E, +pedal pulses Skin: No rashes, lesions or ulcers Psychiatry: Judgement and insight appear normal. Mood & affect appropriate.     Data Reviewed: I have personally reviewed following labs and imaging studies  CBC: Recent Labs  Lab 05/04/18 1253 05/05/18 0638  WBC 6.5 3.0*  NEUTROABS 5.2  --   HGB 11.5* 10.4*  HCT 35.4* 32.0*  MCV 91.5 91.4  PLT 209 712   Basic Metabolic Panel: Recent Labs  Lab 05/04/18 1253 05/05/18 0638  NA 133* 136  K 4.3 4.0  CL 85* 92*  CO2 37* 37*  GLUCOSE 91 141*  BUN 13 12  CREATININE 0.49 0.40*  CALCIUM 10.2 9.6   GFR: Estimated Creatinine Clearance: 33.4 mL/min (A) (by C-G formula based on SCr of 0.4 mg/dL (L)). Liver Function Tests: No results for input(s): AST, ALT, ALKPHOS, BILITOT, PROT, ALBUMIN in the last 168 hours. No results for input(s): LIPASE, AMYLASE in the last 168 hours. No results for input(s): AMMONIA in the last 168 hours. Coagulation Profile: No results for  input(s): INR, PROTIME in the last 168 hours. Cardiac Enzymes: Recent Labs  Lab 05/04/18 1253 05/04/18 1553  TROPONINI <0.03 <0.03   BNP (last 3 results) No results for input(s): PROBNP in the last 8760 hours. HbA1C: No results for input(s): HGBA1C in the last 72 hours. CBG: No results for input(s): GLUCAP in the last 168 hours. Lipid Profile: No results for input(s): CHOL, HDL, LDLCALC, TRIG, CHOLHDL, LDLDIRECT in the last 72 hours. Thyroid Function Tests: No results for input(s): TSH, T4TOTAL, FREET4, T3FREE, THYROIDAB in the last 72 hours. Anemia Panel: No results for input(s): VITAMINB12, FOLATE, FERRITIN, TIBC, IRON, RETICCTPCT in the last 72 hours. Urine analysis:    Component Value Date/Time   COLORURINE YELLOW 09/21/2016 0246   APPEARANCEUR CLEAR 09/21/2016 0246   LABSPEC 1.013 09/21/2016 0246   PHURINE 6.0 09/21/2016 0246   GLUCOSEU NEGATIVE 09/21/2016 0246   HGBUR MODERATE (A) 09/21/2016 0246   BILIRUBINUR NEGATIVE 09/21/2016 0246   KETONESUR 5 (A) 09/21/2016 0246   PROTEINUR 100 (A) 09/21/2016 0246   UROBILINOGEN 0.2 04/04/2015 1918   NITRITE NEGATIVE 09/21/2016 0246   LEUKOCYTESUR NEGATIVE 09/21/2016 0246   Sepsis Labs: @LABRCNTIP (procalcitonin:4,lacticidven:4)  )No results found for this or any previous visit (from the past 240 hour(s)).       Radiology Studies: Dg Chest 2 View  Result Date: 05/04/2018 CLINICAL DATA:  Weakness beginning last night. SOB increasing. Hx of COPD, acute respiratory failure, on home O2, COPD, HTN. Current smoker. EXAM: CHEST - 2 VIEW COMPARISON:  02/21/2017 FINDINGS: Cardiac silhouette is borderline enlarged. No mediastinal or hilar masses or evidence of adenopathy. Marked lung hyperexpansion. Mild interstitial thickening noted most evident in the bases. No evidence of pneumonia or pulmonary edema. No pleural effusion or pneumothorax. Moderate compression fracture of a midthoracic vertebra, new since the prior exam. Skeletal  structures otherwise intact. IMPRESSION: 1. No acute cardiopulmonary disease. 2. Advanced COPD. 3. Moderate chronic compression fracture of a midthoracic vertebra new since the prior radiographs. Electronically Signed   By: Lajean Manes M.D.   On: 05/04/2018 13:19   Ct Angio Chest Pe W/cm &/or Wo Cm  Result Date: 05/04/2018 CLINICAL DATA:  72 year old female with COPD on home oxygen with new onset weakness. EXAM: CT ANGIOGRAPHY CHEST WITH CONTRAST TECHNIQUE: Multidetector CT imaging of the chest was performed using the standard protocol during bolus administration of intravenous contrast. Multiplanar CT image reconstructions and MIPs were obtained to evaluate the vascular anatomy. CONTRAST:  41mL ISOVUE-370 IOPAMIDOL (ISOVUE-370) INJECTION 76% COMPARISON:  Chest x-ray obtained earlier today; prior CT scan of the chest 07/01/2016; prior PET-CT 12/25/2017 FINDINGS: Cardiovascular: Adequate opacification of the pulmonary arteries. No evidence of central filling defect to suggest acute pulmonary embolus. Main pulmonary artery remains normal in size. The heart is normal in size. Calcifications present along the coronary arteries. Conventional 3 vessel aortic arch anatomy. No evidence of aneurysm or dissection. Mild atherosclerotic plaque. Mediastinum/Nodes: Unremarkable CT appearance of the thyroid gland. No suspicious mediastinal or hilar adenopathy. No soft tissue mediastinal mass. The thoracic esophagus is unremarkable.  Lungs/Pleura: Advanced centrilobular emphysema and pulmonary hyperinflation again noted. No suspicious pulmonary nodule or mass. Focus of pleuroparenchymal scarring along the posterior aspect of the right upper lobe is slightly more prominent than previously noted at 1.4 x 1.2 cm. No significant change compared to PET-CT imaging from 12/25/2017. Mild bronchial wall thickening. Several bronchi in the right lower lobe are impacted with secretions. No pneumothorax or pleural effusion. Upper Abdomen:  No acute abnormality within the visualized upper abdomen. Old granulomatous disease as evidenced by punctate calcifications in the spleen. Musculoskeletal: T7 compression fracture with approximately 60% height loss anterior. This is a new finding compared to 07/01/2016 Review of the MIP images confirms the above findings. IMPRESSION: 1. Negative for acute pulmonary embolus, pneumonia or other acute cardiopulmonary process. 2. New T7 compression fracture with approximately 60% height loss anteriorly. This represents a new finding compared to 02/11/2017 and may be acute, or subacute. 3. Stable posterior right upper lobe cavitary nodule versus focus of pleuroparenchymal scarring. 4. COPD with advanced emphysema. 5. Coronary artery calcifications. 6.  Aortic Atherosclerosis (ICD10-170.0). Aortic Atherosclerosis (ICD10-I70.0) and Emphysema (ICD10-J43.9). Electronically Signed   By: Jacqulynn Cadet M.D.   On: 05/04/2018 15:53        Scheduled Meds: . amLODipine  5 mg Oral Daily  . atorvastatin  20 mg Oral q1800  . docusate sodium  100 mg Oral BID  . enoxaparin (LOVENOX) injection  30 mg Subcutaneous Q24H  . escitalopram  10 mg Oral Daily  . feeding supplement (ENSURE ENLIVE)  237 mL Oral BID BM  . fluticasone  2 spray Each Nare Daily  . gabapentin  100 mg Oral Daily  . mouth rinse  15 mL Mouth Rinse BID  . methylPREDNISolone (SOLU-MEDROL) injection  80 mg Intravenous Q8H  . mometasone-formoterol  2 puff Inhalation BID  . multivitamin with minerals  1 tablet Oral Daily  . nicotine  14 mg Transdermal Daily  . pantoprazole  40 mg Oral Daily  . sodium chloride flush  3 mL Intravenous Q12H  . tiotropium  18 mcg Inhalation Daily   Continuous Infusions: . sodium chloride       LOS: 1 day    Time spent: 35 minutes. Greater than 50% of this time was spent in direct contact with the patient, coordinating care and discussing relevant ongoing clinical issues, including improvement of her COPD, plan  to continue treatment with steroids and nebs and hopeful discharge over the next 24 hours pending clinical improvement.     Lelon Frohlich, MD [facility] Pager [phone]  If 7PM-7AM, please contact night-coverage [email or web address] Password [email or web address] 05/05/2018, 4:08 PM

## 2018-05-05 NOTE — Progress Notes (Signed)
Initial Nutrition Assessment  DOCUMENTATION CODES:  Underweight  INTERVENTION:  Ensure Enlive po BID, each supplement provides 350 kcal and 20 grams of protein  MVI already ordered.   If still admitted next week, will try to f/u and provide diet education on high kcal/pro diet  NUTRITION DIAGNOSIS:  Inadequate oral intake related to chronic illness (Advanced COPD/underlying hypermetabolic process), social / environmental circumstances (continued tobacco abuse) as evidenced by loss of >20% bw in <1 year.   GOAL:  Patient will meet greater than or equal to 90% of their needs  MONITOR:  PO intake, Supplement acceptance, Labs, Weight trends, Diet advancement  REASON FOR ASSESSMENT:  Consult Assessment of nutrition requirement/status  ASSESSMENT:  72 y/o female PMHx COPD, GERD, Tobacco abuse, htn/hld, tobacco abuse, anxiety, remote h/n cancer of uvula, w/ PET scan in April showed solitary lung mass suspicious for metastasis. Refused to pursue radiation. Now presents with SOB x24 hrs.   RD operating remotely on weekends. Information obtained from chart.  Per chart, she displays gradual weight loss. She was 96.5 lbs roughly 1year ago. She was 85 lbs last December, 83 lbs in March, 78 lbs In May and was now admitted at 74.5 lbs.   Likely cause of continued weight loss, she has end stage COPD and likely underlying hypermetabolic process. She continues to smoke.   Per last encounter with RD last October, she said she was eating 3 meals/day, but was not drinking supplements or taking vitamins. Ideally, she could use cigarette expenses to pay for supplements.   No documented intake as of yet. Begin Ensure Enlive to BID. MVI already ordered. If still admitted next week, can follow up and provide instruction on a high kcal/pro diet.   Labs: H/H: 10.4/32, Glu:141 Meds: Colace, Methylprednisolone, MVI with min, PPI  Recent Labs  Lab 05/04/18 1253 05/05/18 0638  NA 133* 136  K 4.3 4.0   CL 85* 92*  CO2 37* 37*  BUN 13 12  CREATININE 0.49 0.40*  CALCIUM 10.2 9.6  GLUCOSE 91 141*   NUTRITION - FOCUSED PHYSICAL EXAM: Unable to conduct at this time  Diet Order:   Diet Order            Diet regular Room service appropriate? Yes; Fluid consistency: Thin  Diet effective now             EDUCATION NEEDS:  Not appropriate for education at this time  Skin:  Skin Assessment: Reviewed RN Assessment  Last BM:  8/9  Height:  Ht Readings from Last 1 Encounters:  05/04/18 5\' 5"  (1.651 m)   Weight:  Wt Readings from Last 1 Encounters:  05/04/18 33.8 kg   Wt Readings from Last 10 Encounters:  05/04/18 33.8 kg  03/09/18 36.4 kg  02/08/18 35.4 kg  12/27/17 37.4 kg  12/12/17 37.9 kg  09/12/17 38.6 kg  07/31/17 43.5 kg  07/14/17 44.1 kg  06/14/17 43.8 kg  02/16/17 44.8 kg   Ideal Body Weight:  56.82 kg  BMI:  Body mass index is 12.4 kg/m.  Estimated Nutritional Needs:  Kcal:  >1500 kcals (45 kcals/kg bw) Protein:  >70g Pro (2g/kg bw) Fluid:  >1 L fluid (30 ml/kg bw)  Burtis Junes RD, LDN, CNSC Clinical Nutrition Available Tues-Sat via Pager: [phone] 05/05/2018 9:11 AM

## 2018-05-06 DIAGNOSIS — Z23 Encounter for immunization: Secondary | ICD-10-CM | POA: Diagnosis not present

## 2018-05-06 MED ORDER — ADULT MULTIVITAMIN W/MINERALS CH: 1.0000 | ORAL_TABLET | Freq: Every day | ORAL | Status: DC

## 2018-05-06 MED ORDER — DM-GUAIFENESIN ER 30-600 MG PO TB12
1.0000 | ORAL_TABLET | Freq: Two times a day (BID) | ORAL | Status: DC
  Administered 2018-05-06: 1 via ORAL
  Filled 2018-05-06: qty 1

## 2018-05-06 MED ORDER — PREDNISONE 10 MG PO TABS: 10.0000 mg | ORAL_TABLET | Freq: Every day | ORAL | 0 refills | Status: DC

## 2018-05-06 NOTE — Discharge Summary (Signed)
Physician Discharge Summary  Lindsey Combs:[id number] DOB: 12-Mar-1946 DOA: 05/04/2018  PCP: [name], MD  Admit date: 05/04/2018 Discharge date: 05/06/2018  Time spent: 45 minutes  Recommendations for Outpatient Follow-up:  -Will be discharged home today. -Advised to follow up with PCP in 1 week as already scheduled.   Discharge Diagnoses:  Principal Problem:   Acute on chronic respiratory failure with hypoxia (HCC) Active Problems:   COPD exacerbation (HCC)   Abnormal weight loss   GERD (gastroesophageal reflux disease)   Tobacco dependence   Tobacco use   Hyperlipidemia   Hypertension   Carcinoma of uvula (HCC)   Severe protein-calorie malnutrition ([place])   Discharge Condition: Stable and improved  Filed Weights   05/04/18 1904  Weight: 33.8 kg    History of present illness:  Lindsey Combs is a 72 y.o. female with multiple medical comorbidities including COPD, chronic hypoxemic respiratory failure on baseline 3 L of oxygen, head and neck cancer, specifically of the uvula with a PET scan in April that showed a solitary lung mass suspicious for metastasis, she was deemed to not be a candidate for biopsy and when she was advised to undergo radiation she refused among other issues.  She comes to the hospital today with shortness of breath that has been ongoing for the past 24 hours.  She is visibly short of breath with increased work of breathing on exam and unable to complete full sentences.  Vital signs are within normal limits, of note she is saturating in the lower to mid 90s on her baseline 3 L of oxygen, troponin x2 was less than 0.03, labs are basically within normal limits, chest x-ray without active cardiopulmonary disease but advanced COPD, there is a compression fracture of a mid thoracic vertebra that is new since prior radiographs.  Patient subsequently had a CT angiogram of the chest that was negative for PE, pneumonia or other acute cardiopulmonary process.   There was a new T7 compression fracture, stable posterior right upper lobe cavitary nodule versus focus of pleuroparenchymal scarring, there is also mention of COPD with advanced emphysema.  We are asked to admit her for further evaluation and management of her respiratory distress.  Hospital Course:   Acute on chronic hypoxemic respiratory failure -Due to COPD with acute exacerbation. -She is on her baseline 3 L of oxygen saturating in the mid to upper 90s. -She appears significantly improved today with no wheezing but much better air movement. -Continue steroid taper, albuterol PRN, Symbicort, Spiriva. -OK for DC home today.  Tobacco abuse -Patient was counseled on admission regarding tobacco cessation, will place nicotine patch at patient request.  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 -At this point will recommend outpatient oncology follow-up.    Severe protein caloric malnutrition -Continue daily multivitamin, appreciate nutrition recommendations.  Procedures:  None   Consultations:  None  Discharge Instructions  Discharge Instructions    Diet - low sodium heart healthy   Complete by:  As directed    Increase activity slowly   Complete by:  As directed      Allergies as of 05/06/2018      Reactions   Prednisone Nausea Only   Chantix [varenicline] Other (See Comments)   Mental status changes   Codeine Itching      Medication List  STOP taking these medications   cefdinir 300 MG capsule Commonly known as:  OMNICEF   ciprofloxacin 250 MG tablet Commonly known as:  CIPRO   gabapentin 100 MG capsule Commonly known as:  NEURONTIN     TAKE these medications   acetaminophen 500 MG tablet Commonly known as:  TYLENOL Take 500 mg by mouth every 6 (six) hours as needed for mild pain.   albuterol  108 (90 Base) MCG/ACT inhaler Commonly known as:  PROVENTIL HFA;VENTOLIN HFA Inhale 2 puffs into the lungs every 4 (four) hours as needed for wheezing or shortness of breath.   ALPRAZolam 0.5 MG tablet Commonly known as:  XANAX Take 1 tablet (0.5 mg total) by mouth 2 (two) times daily as needed for anxiety.   amLODipine 5 MG tablet Commonly known as:  NORVASC TAKE 1 TABLET BY MOUTH ONCE A DAY.   atorvastatin 20 MG tablet Commonly known as:  LIPITOR Take 20 mg by mouth daily.   escitalopram 10 MG tablet Commonly known as:  LEXAPRO Take 10 mg by mouth daily.   fluticasone 50 MCG/ACT nasal spray Commonly known as:  FLONASE Place 2 sprays into both nostrils daily as needed.   Melatonin 5 MG Tabs Take 5 mg by mouth at bedtime as needed.   multivitamin with minerals Tabs tablet Take 1 tablet by mouth daily. Start taking on:  05/07/2018   omeprazole 20 MG capsule Commonly known as:  PRILOSEC Take 1 capsule by mouth daily.   predniSONE 10 MG tablet Commonly known as:  DELTASONE Take 1 tablet (10 mg total) by mouth daily with breakfast. Take 6 tablets today and then decrease by 1 tablet daily until none are left.   SYMBICORT 160-4.5 MCG/ACT inhaler Generic drug:  budesonide-formoterol Inhale 2 puffs into the lungs 2 (two) times daily.   tiotropium 18 MCG inhalation capsule Commonly known as:  SPIRIVA Place 18 mcg into inhaler and inhale daily as needed. Shortness of breath      Allergies  Allergen Reactions  . Prednisone Nausea Only  . Chantix [Varenicline] Other (See Comments)    Mental status changes  . Codeine Itching   Follow-up Information    [name], MD. Schedule an appointment as soon as possible for a visit in 2 week(s).   Specialty:  Internal Medicine Contact information: [address] [place] [address] [phone]            The results of significant diagnostics from this hospitalization (including imaging, microbiology,  ancillary and laboratory) are listed below for reference.    Significant Diagnostic Studies: Dg Chest 2 View  Result Date: 05/04/2018 CLINICAL DATA:  Weakness beginning last night. SOB increasing. Hx of COPD, acute respiratory failure, on home O2, COPD, HTN. Current smoker. EXAM: CHEST - 2 VIEW COMPARISON:  02/21/2017 FINDINGS: Cardiac silhouette is borderline enlarged. No mediastinal or hilar masses or evidence of adenopathy. Marked lung hyperexpansion. Mild interstitial thickening noted most evident in the bases. No evidence of pneumonia or pulmonary edema. No pleural effusion or pneumothorax. Moderate compression fracture of a midthoracic vertebra, new since the prior exam. Skeletal structures otherwise intact. IMPRESSION: 1. No acute cardiopulmonary disease. 2. Advanced COPD. 3. Moderate chronic compression fracture of a midthoracic vertebra new since the prior radiographs. Electronically Signed   By: Lajean Manes M.D.   On: 05/04/2018 13:19   Ct Angio Chest Pe W/cm &/or Wo Cm  Result Date: 05/04/2018 CLINICAL DATA:  72 year old female with COPD on home oxygen with  new onset weakness. EXAM: CT ANGIOGRAPHY CHEST WITH CONTRAST TECHNIQUE: Multidetector CT imaging of the chest was performed using the standard protocol during bolus administration of intravenous contrast. Multiplanar CT image reconstructions and MIPs were obtained to evaluate the vascular anatomy. CONTRAST:  39mL ISOVUE-370 IOPAMIDOL (ISOVUE-370) INJECTION 76% COMPARISON:  Chest x-ray obtained earlier today; prior CT scan of the chest 07/01/2016; prior PET-CT 12/25/2017 FINDINGS: Cardiovascular: Adequate opacification of the pulmonary arteries. No evidence of central filling defect to suggest acute pulmonary embolus. Main pulmonary artery remains normal in size. The heart is normal in size. Calcifications present along the coronary arteries. Conventional 3 vessel aortic arch anatomy. No evidence of aneurysm or dissection. Mild atherosclerotic  plaque. Mediastinum/Nodes: Unremarkable CT appearance of the thyroid gland. No suspicious mediastinal or hilar adenopathy. No soft tissue mediastinal mass. The thoracic esophagus is unremarkable. Lungs/Pleura: Advanced centrilobular emphysema and pulmonary hyperinflation again noted. No suspicious pulmonary nodule or mass. Focus of pleuroparenchymal scarring along the posterior aspect of the right upper lobe is slightly more prominent than previously noted at 1.4 x 1.2 cm. No significant change compared to PET-CT imaging from 12/25/2017. Mild bronchial wall thickening. Several bronchi in the right lower lobe are impacted with secretions. No pneumothorax or pleural effusion. Upper Abdomen: No acute abnormality within the visualized upper abdomen. Old granulomatous disease as evidenced by punctate calcifications in the spleen. Musculoskeletal: T7 compression fracture with approximately 60% height loss anterior. This is a new finding compared to 07/01/2016 Review of the MIP images confirms the above findings. IMPRESSION: 1. Negative for acute pulmonary embolus, pneumonia or other acute cardiopulmonary process. 2. New T7 compression fracture with approximately 60% height loss anteriorly. This represents a new finding compared to 02/11/2017 and may be acute, or subacute. 3. Stable posterior right upper lobe cavitary nodule versus focus of pleuroparenchymal scarring. 4. COPD with advanced emphysema. 5. Coronary artery calcifications. 6.  Aortic Atherosclerosis (ICD10-170.0). Aortic Atherosclerosis (ICD10-I70.0) and Emphysema (ICD10-J43.9). Electronically Signed   By: Jacqulynn Cadet M.D.   On: 05/04/2018 15:53    Microbiology: No results found for this or any previous visit (from the past 240 hour(s)).   Labs: Basic Metabolic Panel: Recent Labs  Lab 05/04/18 1253 05/05/18 0638  NA 133* 136  K 4.3 4.0  CL 85* 92*  CO2 37* 37*  GLUCOSE 91 141*  BUN 13 12  CREATININE 0.49 0.40*  CALCIUM 10.2 9.6    Liver Function Tests: No results for input(s): AST, ALT, ALKPHOS, BILITOT, PROT, ALBUMIN in the last 168 hours. No results for input(s): LIPASE, AMYLASE in the last 168 hours. No results for input(s): AMMONIA in the last 168 hours. CBC: Recent Labs  Lab 05/04/18 1253 05/05/18 0638  WBC 6.5 3.0*  NEUTROABS 5.2  --   HGB 11.5* 10.4*  HCT 35.4* 32.0*  MCV 91.5 91.4  PLT 209 227   Cardiac Enzymes: Recent Labs  Lab 05/04/18 1253 05/04/18 1553  TROPONINI <0.03 <0.03   BNP: BNP (last 3 results) No results for input(s): BNP in the last 8760 hours.  ProBNP (last 3 results) No results for input(s): PROBNP in the last 8760 hours.  CBG: No results for input(s): GLUCAP in the last 168 hours.     Signed:  Lelon Frohlich  [facility] Pager: [phone] 05/06/2018, 11:19 AM

## 2018-05-06 NOTE — Progress Notes (Signed)
Pt discharged home today per Dr. Jerilee Hoh. Pt's IV site D/C'd and WDL. Pt's VSS. Pt and son provided with home medication list, discharge instructions and prescriptions. Verbalized understanding. Pt is currently waiting for her daughter. Will continue to monitor.

## 2018-05-07 DIAGNOSIS — J441 Chronic obstructive pulmonary disease with (acute) exacerbation: Secondary | ICD-10-CM | POA: Diagnosis not present

## 2018-05-07 DIAGNOSIS — J9801 Acute bronchospasm: Secondary | ICD-10-CM | POA: Diagnosis not present

## 2018-05-11 DIAGNOSIS — R202 Paresthesia of skin: Secondary | ICD-10-CM | POA: Diagnosis not present

## 2018-05-11 DIAGNOSIS — J9621 Acute and chronic respiratory failure with hypoxia: Secondary | ICD-10-CM | POA: Diagnosis not present

## 2018-05-11 DIAGNOSIS — Z5181 Encounter for therapeutic drug level monitoring: Secondary | ICD-10-CM | POA: Diagnosis not present

## 2018-05-11 DIAGNOSIS — C140 Malignant neoplasm of pharynx, unspecified: Secondary | ICD-10-CM | POA: Diagnosis not present

## 2018-05-11 DIAGNOSIS — Z79899 Other long term (current) drug therapy: Secondary | ICD-10-CM | POA: Diagnosis not present

## 2018-05-14 ENCOUNTER — Other Ambulatory Visit (HOSPITAL_COMMUNITY): Payer: Medicare Other

## 2018-05-14 ENCOUNTER — Ambulatory Visit (HOSPITAL_COMMUNITY): Payer: Medicare Other

## 2018-05-16 ENCOUNTER — Ambulatory Visit (HOSPITAL_COMMUNITY): Payer: Medicare Other | Admitting: Internal Medicine

## 2018-06-07 DIAGNOSIS — J441 Chronic obstructive pulmonary disease with (acute) exacerbation: Secondary | ICD-10-CM | POA: Diagnosis not present

## 2018-06-07 DIAGNOSIS — J9801 Acute bronchospasm: Secondary | ICD-10-CM | POA: Diagnosis not present

## 2018-06-11 ENCOUNTER — Encounter (HOSPITAL_COMMUNITY): Payer: Medicare Other

## 2018-06-11 ENCOUNTER — Other Ambulatory Visit (HOSPITAL_COMMUNITY): Payer: Medicare Other

## 2018-06-13 ENCOUNTER — Ambulatory Visit (HOSPITAL_COMMUNITY): Payer: Medicare Other | Admitting: Internal Medicine

## 2018-07-07 DIAGNOSIS — J9801 Acute bronchospasm: Secondary | ICD-10-CM | POA: Diagnosis not present

## 2018-07-07 DIAGNOSIS — J441 Chronic obstructive pulmonary disease with (acute) exacerbation: Secondary | ICD-10-CM | POA: Diagnosis not present

## 2018-07-16 ENCOUNTER — Emergency Department (HOSPITAL_COMMUNITY)
Admission: EM | Admit: 2018-07-16 | Discharge: 2018-07-16 | Disposition: A | Discharge status: Home / Self Care | Payer: Medicare Other | Attending: Emergency Medicine | Admitting: Emergency Medicine

## 2018-07-16 DIAGNOSIS — Y9289 Other specified places as the place of occurrence of the external cause: Secondary | ICD-10-CM | POA: Insufficient documentation

## 2018-07-16 DIAGNOSIS — M533 Sacrococcygeal disorders, not elsewhere classified: Secondary | ICD-10-CM | POA: Diagnosis not present

## 2018-07-16 DIAGNOSIS — W010XXA Fall on same level from slipping, tripping and stumbling without subsequent striking against object, initial encounter: Secondary | ICD-10-CM | POA: Insufficient documentation

## 2018-07-16 DIAGNOSIS — Z5321 Procedure and treatment not carried out due to patient leaving prior to being seen by health care provider: Secondary | ICD-10-CM | POA: Diagnosis not present

## 2018-07-16 DIAGNOSIS — Y9389 Activity, other specified: Secondary | ICD-10-CM | POA: Insufficient documentation

## 2018-07-16 DIAGNOSIS — Y998 Other external cause status: Secondary | ICD-10-CM | POA: Insufficient documentation

## 2018-07-16 NOTE — ED Triage Notes (Signed)
Pt states she went to sit down on the toilet today and she fell onto the toilet and slid down to the floor.  States she is hurting in her tailbone.  Has had multiple falls this week per pt.

## 2018-07-16 NOTE — ED Notes (Signed)
Pt decided she was going to go home due to wait.

## 2018-07-17 DIAGNOSIS — N390 Urinary tract infection, site not specified: Secondary | ICD-10-CM | POA: Diagnosis not present

## 2018-07-17 DIAGNOSIS — E871 Hypo-osmolality and hyponatremia: Secondary | ICD-10-CM | POA: Diagnosis not present

## 2018-07-17 DIAGNOSIS — J441 Chronic obstructive pulmonary disease with (acute) exacerbation: Secondary | ICD-10-CM | POA: Diagnosis not present

## 2018-07-17 DIAGNOSIS — I10 Essential (primary) hypertension: Secondary | ICD-10-CM | POA: Diagnosis not present

## 2018-07-17 DIAGNOSIS — M47812 Spondylosis without myelopathy or radiculopathy, cervical region: Secondary | ICD-10-CM | POA: Diagnosis not present

## 2018-07-17 DIAGNOSIS — Z7951 Long term (current) use of inhaled steroids: Secondary | ICD-10-CM | POA: Diagnosis not present

## 2018-07-17 DIAGNOSIS — Z9981 Dependence on supplemental oxygen: Secondary | ICD-10-CM | POA: Diagnosis not present

## 2018-07-17 DIAGNOSIS — J9602 Acute respiratory failure with hypercapnia: Secondary | ICD-10-CM | POA: Diagnosis not present

## 2018-07-17 DIAGNOSIS — E876 Hypokalemia: Secondary | ICD-10-CM | POA: Diagnosis not present

## 2018-07-17 DIAGNOSIS — R64 Cachexia: Secondary | ICD-10-CM | POA: Diagnosis not present

## 2018-07-17 DIAGNOSIS — S299XXA Unspecified injury of thorax, initial encounter: Secondary | ICD-10-CM | POA: Diagnosis not present

## 2018-07-17 DIAGNOSIS — S3993XA Unspecified injury of pelvis, initial encounter: Secondary | ICD-10-CM | POA: Diagnosis not present

## 2018-07-17 DIAGNOSIS — W1830XA Fall on same level, unspecified, initial encounter: Secondary | ICD-10-CM | POA: Diagnosis not present

## 2018-07-17 DIAGNOSIS — J449 Chronic obstructive pulmonary disease, unspecified: Secondary | ICD-10-CM | POA: Diagnosis not present

## 2018-07-17 DIAGNOSIS — E1121 Type 2 diabetes mellitus with diabetic nephropathy: Secondary | ICD-10-CM | POA: Diagnosis not present

## 2018-07-17 DIAGNOSIS — S3992XA Unspecified injury of lower back, initial encounter: Secondary | ICD-10-CM | POA: Diagnosis not present

## 2018-07-17 DIAGNOSIS — E872 Acidosis: Secondary | ICD-10-CM | POA: Diagnosis not present

## 2018-07-17 DIAGNOSIS — M542 Cervicalgia: Secondary | ICD-10-CM | POA: Diagnosis not present

## 2018-07-17 DIAGNOSIS — N189 Chronic kidney disease, unspecified: Secondary | ICD-10-CM | POA: Diagnosis not present

## 2018-07-17 DIAGNOSIS — I129 Hypertensive chronic kidney disease with stage 1 through stage 4 chronic kidney disease, or unspecified chronic kidney disease: Secondary | ICD-10-CM | POA: Diagnosis not present

## 2018-07-17 DIAGNOSIS — E785 Hyperlipidemia, unspecified: Secondary | ICD-10-CM | POA: Diagnosis not present

## 2018-07-17 DIAGNOSIS — E039 Hypothyroidism, unspecified: Secondary | ICD-10-CM | POA: Diagnosis not present

## 2018-07-17 DIAGNOSIS — M6281 Muscle weakness (generalized): Secondary | ICD-10-CM | POA: Diagnosis not present

## 2018-07-17 DIAGNOSIS — M545 Low back pain: Secondary | ICD-10-CM | POA: Diagnosis not present

## 2018-07-17 DIAGNOSIS — R531 Weakness: Secondary | ICD-10-CM | POA: Diagnosis not present

## 2018-07-17 DIAGNOSIS — R5381 Other malaise: Secondary | ICD-10-CM | POA: Diagnosis not present

## 2018-07-17 DIAGNOSIS — R0602 Shortness of breath: Secondary | ICD-10-CM | POA: Diagnosis not present

## 2018-07-20 DIAGNOSIS — M6281 Muscle weakness (generalized): Secondary | ICD-10-CM | POA: Diagnosis not present

## 2018-07-20 DIAGNOSIS — R531 Weakness: Secondary | ICD-10-CM | POA: Diagnosis not present

## 2018-07-20 DIAGNOSIS — E871 Hypo-osmolality and hyponatremia: Secondary | ICD-10-CM | POA: Diagnosis not present

## 2018-07-20 DIAGNOSIS — I10 Essential (primary) hypertension: Secondary | ICD-10-CM | POA: Diagnosis not present

## 2018-07-20 DIAGNOSIS — E1121 Type 2 diabetes mellitus with diabetic nephropathy: Secondary | ICD-10-CM | POA: Diagnosis not present

## 2018-07-20 DIAGNOSIS — M542 Cervicalgia: Secondary | ICD-10-CM | POA: Diagnosis not present

## 2018-07-20 DIAGNOSIS — K219 Gastro-esophageal reflux disease without esophagitis: Secondary | ICD-10-CM | POA: Diagnosis not present

## 2018-07-20 DIAGNOSIS — J9602 Acute respiratory failure with hypercapnia: Secondary | ICD-10-CM | POA: Diagnosis not present

## 2018-07-20 DIAGNOSIS — N390 Urinary tract infection, site not specified: Secondary | ICD-10-CM | POA: Diagnosis not present

## 2018-07-20 DIAGNOSIS — R0602 Shortness of breath: Secondary | ICD-10-CM | POA: Diagnosis not present

## 2018-07-20 DIAGNOSIS — M47812 Spondylosis without myelopathy or radiculopathy, cervical region: Secondary | ICD-10-CM | POA: Diagnosis not present

## 2018-07-20 DIAGNOSIS — J441 Chronic obstructive pulmonary disease with (acute) exacerbation: Secondary | ICD-10-CM | POA: Diagnosis not present

## 2018-07-20 DIAGNOSIS — G6289 Other specified polyneuropathies: Secondary | ICD-10-CM | POA: Diagnosis not present

## 2018-07-20 DIAGNOSIS — E440 Moderate protein-calorie malnutrition: Secondary | ICD-10-CM | POA: Diagnosis not present

## 2018-07-20 DIAGNOSIS — E039 Hypothyroidism, unspecified: Secondary | ICD-10-CM | POA: Diagnosis not present

## 2018-07-20 DIAGNOSIS — J9620 Acute and chronic respiratory failure, unspecified whether with hypoxia or hypercapnia: Secondary | ICD-10-CM | POA: Diagnosis not present

## 2018-07-20 DIAGNOSIS — J449 Chronic obstructive pulmonary disease, unspecified: Secondary | ICD-10-CM | POA: Diagnosis not present

## 2018-07-23 DIAGNOSIS — I10 Essential (primary) hypertension: Secondary | ICD-10-CM | POA: Diagnosis not present

## 2018-07-23 DIAGNOSIS — K219 Gastro-esophageal reflux disease without esophagitis: Secondary | ICD-10-CM | POA: Diagnosis not present

## 2018-07-23 DIAGNOSIS — J441 Chronic obstructive pulmonary disease with (acute) exacerbation: Secondary | ICD-10-CM | POA: Diagnosis not present

## 2018-07-23 DIAGNOSIS — E440 Moderate protein-calorie malnutrition: Secondary | ICD-10-CM | POA: Diagnosis not present

## 2018-07-25 DIAGNOSIS — J9620 Acute and chronic respiratory failure, unspecified whether with hypoxia or hypercapnia: Secondary | ICD-10-CM | POA: Diagnosis not present

## 2018-07-25 DIAGNOSIS — J441 Chronic obstructive pulmonary disease with (acute) exacerbation: Secondary | ICD-10-CM | POA: Diagnosis not present

## 2018-07-25 DIAGNOSIS — G6289 Other specified polyneuropathies: Secondary | ICD-10-CM | POA: Diagnosis not present

## 2018-07-25 DIAGNOSIS — E871 Hypo-osmolality and hyponatremia: Secondary | ICD-10-CM | POA: Diagnosis not present

## 2018-07-30 DIAGNOSIS — J441 Chronic obstructive pulmonary disease with (acute) exacerbation: Secondary | ICD-10-CM | POA: Diagnosis not present

## 2018-07-31 DIAGNOSIS — D649 Anemia, unspecified: Secondary | ICD-10-CM | POA: Diagnosis not present

## 2018-07-31 DIAGNOSIS — E119 Type 2 diabetes mellitus without complications: Secondary | ICD-10-CM | POA: Diagnosis not present

## 2018-07-31 DIAGNOSIS — I10 Essential (primary) hypertension: Secondary | ICD-10-CM | POA: Diagnosis not present

## 2018-07-31 DIAGNOSIS — E039 Hypothyroidism, unspecified: Secondary | ICD-10-CM | POA: Diagnosis not present

## 2018-08-06 DIAGNOSIS — J441 Chronic obstructive pulmonary disease with (acute) exacerbation: Secondary | ICD-10-CM | POA: Diagnosis not present

## 2018-08-06 DIAGNOSIS — J9620 Acute and chronic respiratory failure, unspecified whether with hypoxia or hypercapnia: Secondary | ICD-10-CM | POA: Diagnosis not present

## 2018-08-07 DIAGNOSIS — J9801 Acute bronchospasm: Secondary | ICD-10-CM | POA: Diagnosis not present

## 2018-08-07 DIAGNOSIS — J441 Chronic obstructive pulmonary disease with (acute) exacerbation: Secondary | ICD-10-CM | POA: Diagnosis not present

## 2018-08-08 DIAGNOSIS — J441 Chronic obstructive pulmonary disease with (acute) exacerbation: Secondary | ICD-10-CM | POA: Diagnosis not present

## 2018-08-08 DIAGNOSIS — I10 Essential (primary) hypertension: Secondary | ICD-10-CM | POA: Diagnosis not present

## 2018-08-08 DIAGNOSIS — G6289 Other specified polyneuropathies: Secondary | ICD-10-CM | POA: Diagnosis not present

## 2018-08-08 DIAGNOSIS — K219 Gastro-esophageal reflux disease without esophagitis: Secondary | ICD-10-CM | POA: Diagnosis not present

## 2018-08-09 DIAGNOSIS — J441 Chronic obstructive pulmonary disease with (acute) exacerbation: Secondary | ICD-10-CM | POA: Diagnosis not present

## 2018-08-09 DIAGNOSIS — J9801 Acute bronchospasm: Secondary | ICD-10-CM | POA: Diagnosis not present

## 2018-08-15 DIAGNOSIS — H353131 Nonexudative age-related macular degeneration, bilateral, early dry stage: Secondary | ICD-10-CM | POA: Diagnosis not present

## 2018-08-15 DIAGNOSIS — H524 Presbyopia: Secondary | ICD-10-CM | POA: Diagnosis not present

## 2018-08-15 DIAGNOSIS — H5213 Myopia, bilateral: Secondary | ICD-10-CM | POA: Diagnosis not present

## 2018-08-20 DIAGNOSIS — J9601 Acute respiratory failure with hypoxia: Secondary | ICD-10-CM | POA: Diagnosis not present

## 2018-08-20 DIAGNOSIS — R531 Weakness: Secondary | ICD-10-CM | POA: Diagnosis not present

## 2018-08-20 DIAGNOSIS — J441 Chronic obstructive pulmonary disease with (acute) exacerbation: Secondary | ICD-10-CM | POA: Diagnosis not present

## 2018-08-20 DIAGNOSIS — Z5181 Encounter for therapeutic drug level monitoring: Secondary | ICD-10-CM | POA: Diagnosis not present

## 2018-08-20 DIAGNOSIS — Z9181 History of falling: Secondary | ICD-10-CM | POA: Diagnosis not present

## 2018-08-20 DIAGNOSIS — Z79899 Other long term (current) drug therapy: Secondary | ICD-10-CM | POA: Diagnosis not present

## 2018-08-20 DIAGNOSIS — M6281 Muscle weakness (generalized): Secondary | ICD-10-CM | POA: Diagnosis not present

## 2018-08-20 DIAGNOSIS — R600 Localized edema: Secondary | ICD-10-CM | POA: Diagnosis not present

## 2018-08-20 DIAGNOSIS — I10 Essential (primary) hypertension: Secondary | ICD-10-CM | POA: Diagnosis not present

## 2018-08-28 DIAGNOSIS — H524 Presbyopia: Secondary | ICD-10-CM | POA: Diagnosis not present

## 2018-09-05 DIAGNOSIS — R531 Weakness: Secondary | ICD-10-CM | POA: Diagnosis not present

## 2018-09-05 DIAGNOSIS — Z9981 Dependence on supplemental oxygen: Secondary | ICD-10-CM | POA: Diagnosis not present

## 2018-09-05 DIAGNOSIS — J449 Chronic obstructive pulmonary disease, unspecified: Secondary | ICD-10-CM | POA: Diagnosis not present

## 2018-09-05 DIAGNOSIS — J439 Emphysema, unspecified: Secondary | ICD-10-CM | POA: Diagnosis not present

## 2018-09-05 DIAGNOSIS — J9601 Acute respiratory failure with hypoxia: Secondary | ICD-10-CM | POA: Diagnosis not present

## 2018-09-06 DIAGNOSIS — E785 Hyperlipidemia, unspecified: Secondary | ICD-10-CM | POA: Diagnosis not present

## 2018-09-06 DIAGNOSIS — E1121 Type 2 diabetes mellitus with diabetic nephropathy: Secondary | ICD-10-CM | POA: Diagnosis not present

## 2018-09-06 DIAGNOSIS — J441 Chronic obstructive pulmonary disease with (acute) exacerbation: Secondary | ICD-10-CM | POA: Diagnosis not present

## 2018-09-06 DIAGNOSIS — I10 Essential (primary) hypertension: Secondary | ICD-10-CM | POA: Diagnosis not present

## 2018-09-06 DIAGNOSIS — Z79899 Other long term (current) drug therapy: Secondary | ICD-10-CM | POA: Diagnosis not present

## 2018-09-06 DIAGNOSIS — C140 Malignant neoplasm of pharynx, unspecified: Secondary | ICD-10-CM | POA: Diagnosis not present

## 2018-09-08 DIAGNOSIS — J9801 Acute bronchospasm: Secondary | ICD-10-CM | POA: Diagnosis not present

## 2018-09-08 DIAGNOSIS — J441 Chronic obstructive pulmonary disease with (acute) exacerbation: Secondary | ICD-10-CM | POA: Diagnosis not present

## 2018-09-13 ENCOUNTER — Other Ambulatory Visit (HOSPITAL_COMMUNITY): Payer: Self-pay | Admitting: *Deleted

## 2018-09-13 ENCOUNTER — Other Ambulatory Visit (HOSPITAL_COMMUNITY): Payer: Self-pay | Admitting: Internal Medicine

## 2018-09-13 DIAGNOSIS — R918 Other nonspecific abnormal finding of lung field: Secondary | ICD-10-CM

## 2018-09-13 DIAGNOSIS — C109 Malignant neoplasm of oropharynx, unspecified: Secondary | ICD-10-CM

## 2018-09-13 DIAGNOSIS — D649 Anemia, unspecified: Secondary | ICD-10-CM | POA: Diagnosis not present

## 2018-09-13 DIAGNOSIS — Z5181 Encounter for therapeutic drug level monitoring: Secondary | ICD-10-CM | POA: Diagnosis not present

## 2018-09-13 DIAGNOSIS — E785 Hyperlipidemia, unspecified: Secondary | ICD-10-CM | POA: Diagnosis not present

## 2018-09-13 DIAGNOSIS — Z0001 Encounter for general adult medical examination with abnormal findings: Secondary | ICD-10-CM | POA: Diagnosis not present

## 2018-09-13 DIAGNOSIS — E559 Vitamin D deficiency, unspecified: Secondary | ICD-10-CM | POA: Diagnosis not present

## 2018-09-13 DIAGNOSIS — Z79899 Other long term (current) drug therapy: Secondary | ICD-10-CM | POA: Diagnosis not present

## 2018-09-13 DIAGNOSIS — L309 Dermatitis, unspecified: Secondary | ICD-10-CM | POA: Diagnosis not present

## 2018-09-17 ENCOUNTER — Encounter (HOSPITAL_COMMUNITY): Payer: Medicare Other

## 2018-09-17 ENCOUNTER — Other Ambulatory Visit (HOSPITAL_COMMUNITY): Payer: Medicare Other

## 2018-09-18 ENCOUNTER — Other Ambulatory Visit (HOSPITAL_COMMUNITY): Payer: Medicare Other

## 2018-09-18 ENCOUNTER — Ambulatory Visit (HOSPITAL_COMMUNITY): Payer: Medicare Other | Admitting: Internal Medicine

## 2018-09-24 IMAGING — CT CT HIP*R* W/O CM
2 of 3 series · 16 of 46 positions shown, 18 images · non-contrast
Comparison: None.

CLINICAL DATA: Status post fall, right hip pain

EXAM:
CT OF THE RIGHT HIP WITHOUT CONTRAST
TECHNIQUE: Multidetector CT imaging of the right hip was performed according to
the standard protocol. Multiplanar CT image reconstructions were
also generated.

[Series 3: axial st · axial · 0.36mm/px · z∈[+830,+932]mm · 13 of 59 slices shown, 15 images]
[im 4/59  soft-tissue]
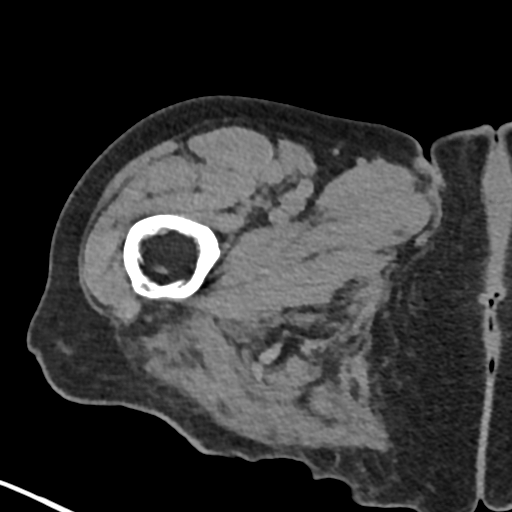
[im 4/59  bone]
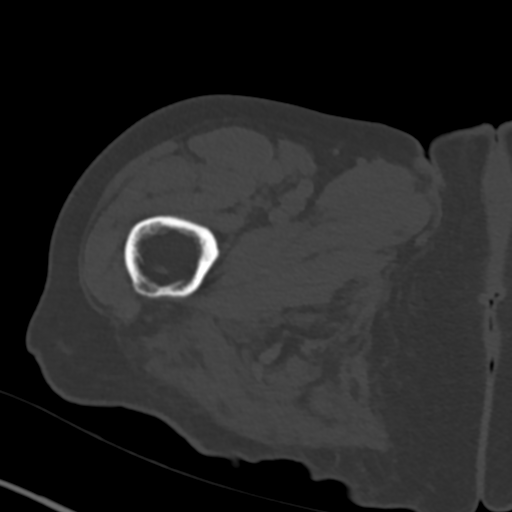
[im 8/59  soft-tissue]
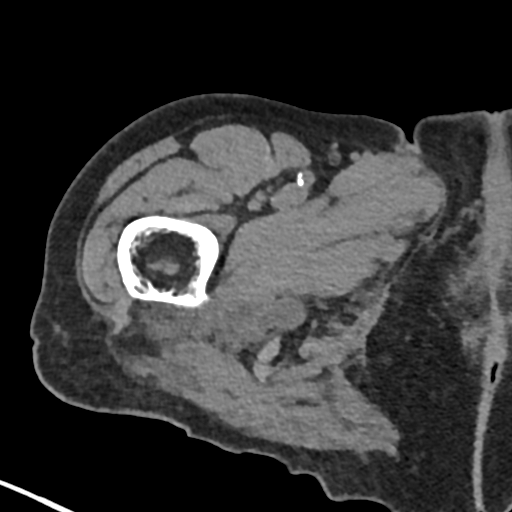
[im 12/59  soft-tissue]
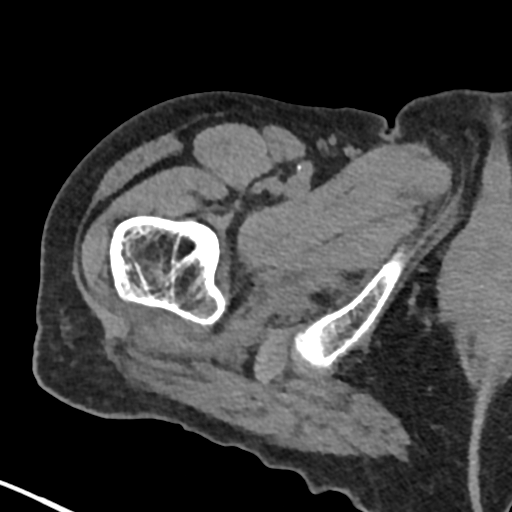
[im 17/59  soft-tissue]
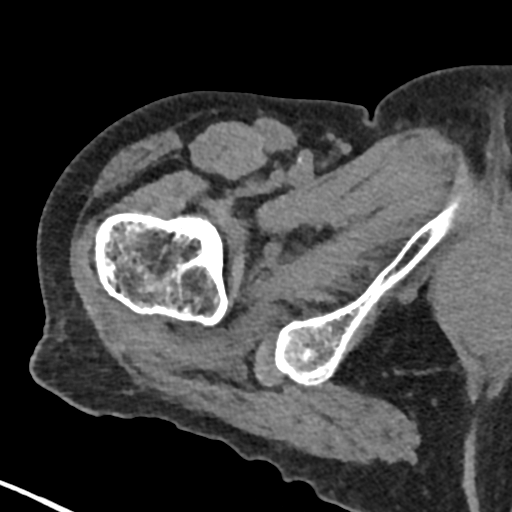
[im 21/59  soft-tissue]
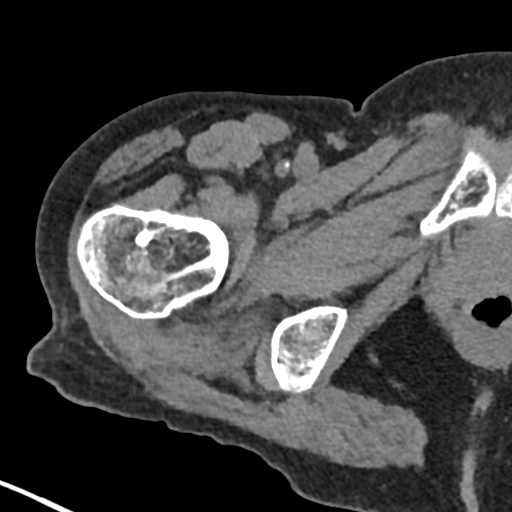
[im 25/59  soft-tissue]
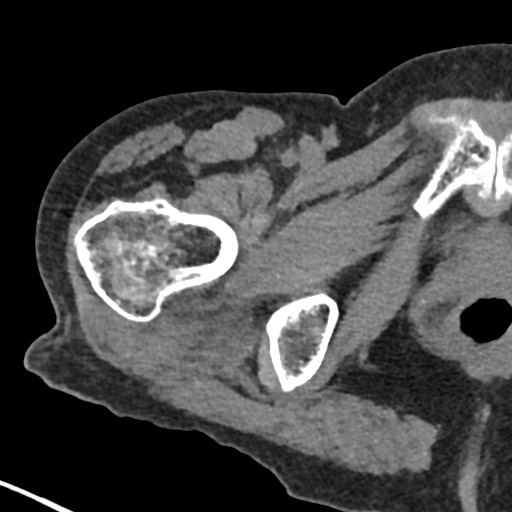
[im 30/59  soft-tissue]
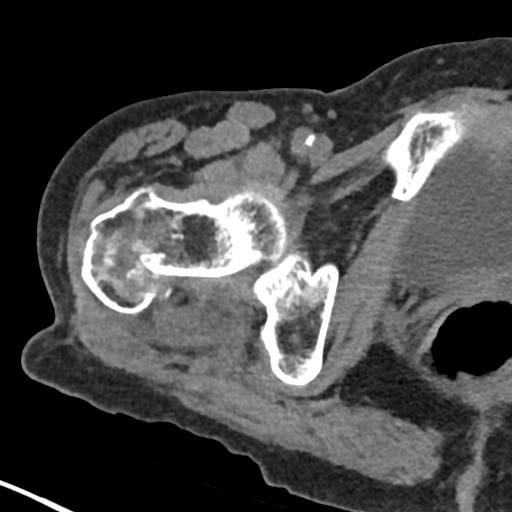
[im 34/59  soft-tissue]
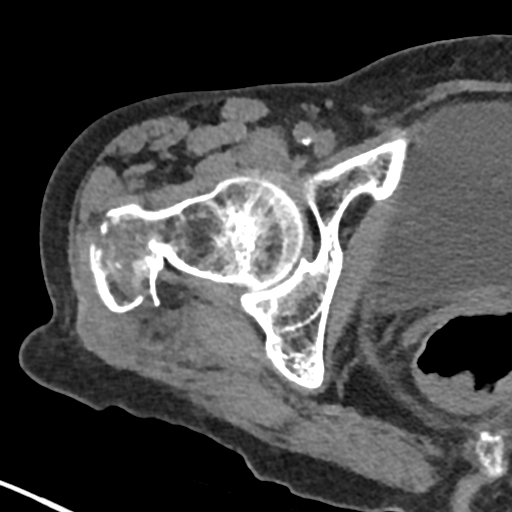
[im 38/59  soft-tissue]
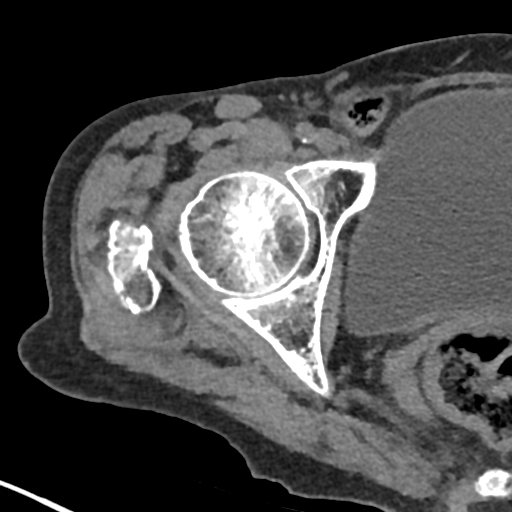
[im 38/59  bone]
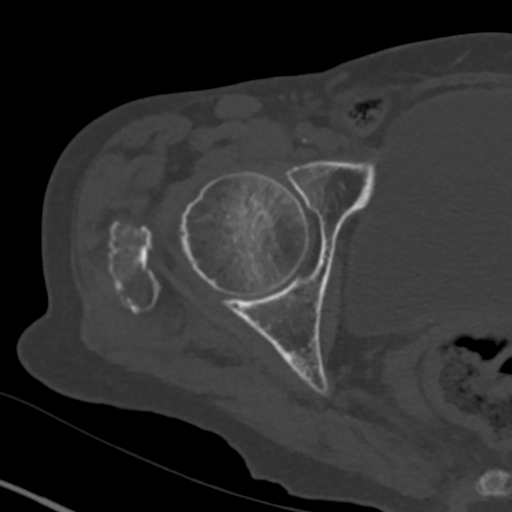
[im 42/59  soft-tissue]
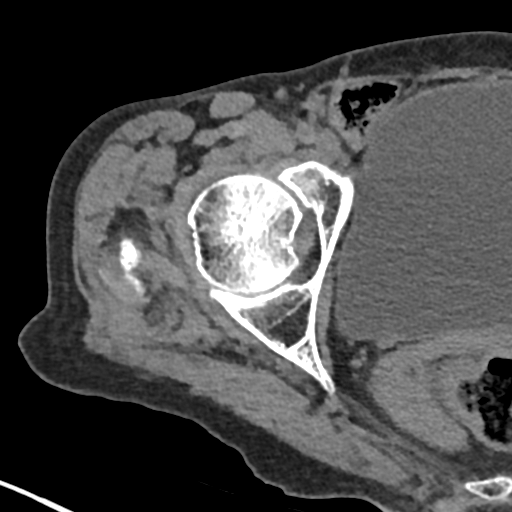
[im 47/59  soft-tissue]
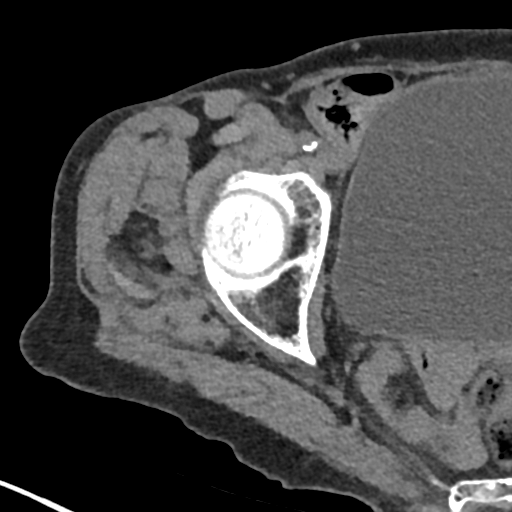
[im 51/59  soft-tissue]
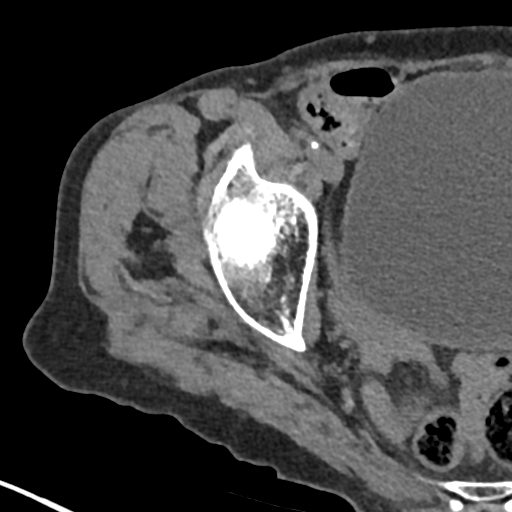
[im 55/59  soft-tissue]
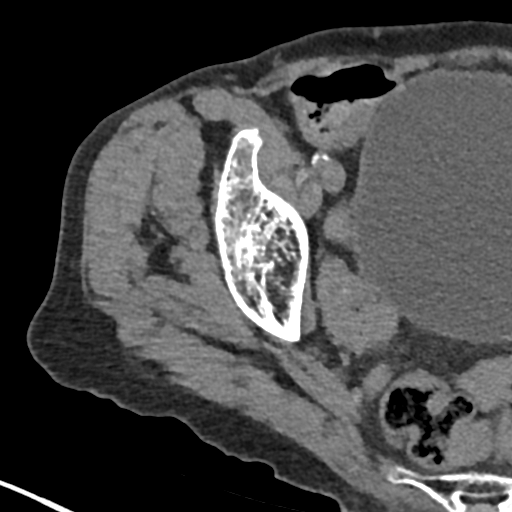

[Series 8: coronal st · coronal · 0.28mm/px · 3 of 68 slices shown]
[im 23/68  soft-tissue]
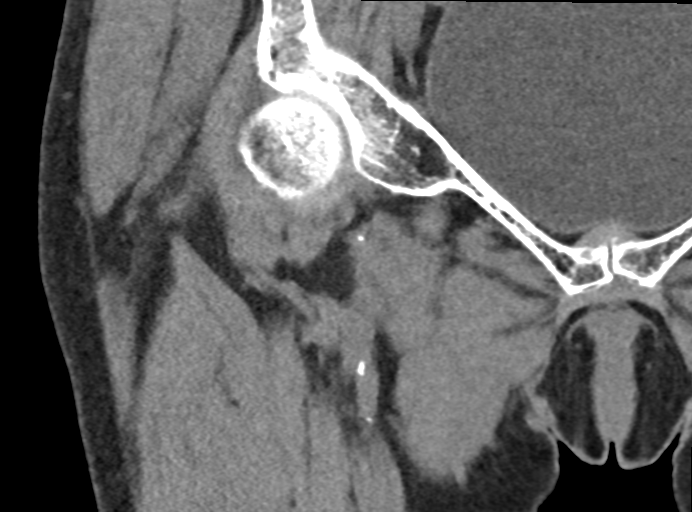
[im 30/68  soft-tissue]
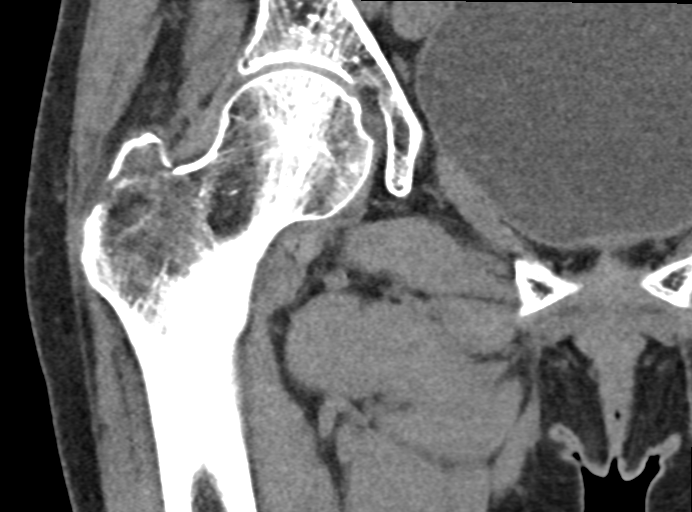
[im 38/68  soft-tissue]
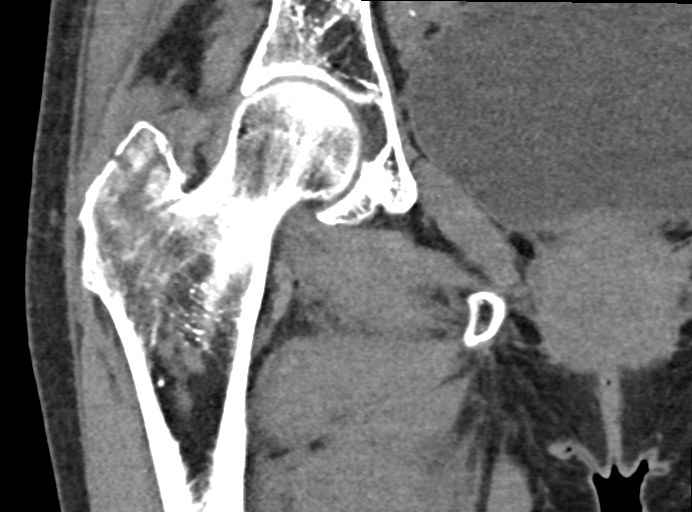

[16 of 46 positions shown; findings below may reference images not displayed]

FINDINGS: Bones/Joint/Cartilage

Acute comminuted, nondisplaced fracture of the right greater
trochanter. No extension of the fracture into the proximal diaphysis
or towards the lesser trochanter.

No other fracture or dislocation. Normal alignment. No joint
effusion.

Ligaments

Ligaments are suboptimally evaluated by CT.

Muscles and Tendons
Muscles are normal. No muscle atrophy. No intramuscular fluid
collection or hematoma.

Soft tissue
No fluid collection or hematoma.  No soft tissue mass.
IMPRESSION: 1. Acute comminuted, nondisplaced fracture of the right greater
trochanter. No extension of the fracture into the proximal diaphysis
or towards the lesser trochanter.

## 2018-10-01 ENCOUNTER — Ambulatory Visit (HOSPITAL_COMMUNITY): Payer: Medicare Other | Admitting: Internal Medicine

## 2018-10-01 ENCOUNTER — Other Ambulatory Visit (HOSPITAL_COMMUNITY): Payer: Medicare Other

## 2018-10-02 DIAGNOSIS — J441 Chronic obstructive pulmonary disease with (acute) exacerbation: Secondary | ICD-10-CM | POA: Diagnosis not present

## 2018-10-03 ENCOUNTER — Ambulatory Visit (HOSPITAL_COMMUNITY)
Admission: RE | Admit: 2018-10-03 | Discharge: 2018-10-03 | Disposition: A | Discharge status: Home / Self Care | Payer: Medicare Other | Source: Ambulatory Visit | Attending: Internal Medicine | Admitting: Internal Medicine

## 2018-10-03 ENCOUNTER — Inpatient Hospital Stay (HOSPITAL_COMMUNITY): Payer: Medicare Other | Attending: Hematology

## 2018-10-03 DIAGNOSIS — J9621 Acute and chronic respiratory failure with hypoxia: Secondary | ICD-10-CM | POA: Insufficient documentation

## 2018-10-03 DIAGNOSIS — F17210 Nicotine dependence, cigarettes, uncomplicated: Secondary | ICD-10-CM | POA: Diagnosis not present

## 2018-10-03 DIAGNOSIS — R911 Solitary pulmonary nodule: Secondary | ICD-10-CM | POA: Insufficient documentation

## 2018-10-03 DIAGNOSIS — E43 Unspecified severe protein-calorie malnutrition: Secondary | ICD-10-CM | POA: Insufficient documentation

## 2018-10-03 DIAGNOSIS — R630 Anorexia: Secondary | ICD-10-CM | POA: Insufficient documentation

## 2018-10-03 DIAGNOSIS — Z9981 Dependence on supplemental oxygen: Secondary | ICD-10-CM | POA: Insufficient documentation

## 2018-10-03 DIAGNOSIS — D509 Iron deficiency anemia, unspecified: Secondary | ICD-10-CM | POA: Diagnosis not present

## 2018-10-03 DIAGNOSIS — C109 Malignant neoplasm of oropharynx, unspecified: Secondary | ICD-10-CM | POA: Insufficient documentation

## 2018-10-03 DIAGNOSIS — R634 Abnormal weight loss: Secondary | ICD-10-CM | POA: Insufficient documentation

## 2018-10-03 DIAGNOSIS — E785 Hyperlipidemia, unspecified: Secondary | ICD-10-CM | POA: Insufficient documentation

## 2018-10-03 DIAGNOSIS — C052 Malignant neoplasm of uvula: Secondary | ICD-10-CM | POA: Diagnosis not present

## 2018-10-03 DIAGNOSIS — R918 Other nonspecific abnormal finding of lung field: Secondary | ICD-10-CM | POA: Diagnosis not present

## 2018-10-03 DIAGNOSIS — I700 Atherosclerosis of aorta: Secondary | ICD-10-CM | POA: Diagnosis not present

## 2018-10-03 DIAGNOSIS — J439 Emphysema, unspecified: Secondary | ICD-10-CM | POA: Insufficient documentation

## 2018-10-03 DIAGNOSIS — Z79899 Other long term (current) drug therapy: Secondary | ICD-10-CM | POA: Insufficient documentation

## 2018-10-03 DIAGNOSIS — I10 Essential (primary) hypertension: Secondary | ICD-10-CM | POA: Insufficient documentation

## 2018-10-03 LAB — CBC WITH DIFFERENTIAL/PLATELET
Abs Immature Granulocytes: 0.03 10*3/uL (ref 0.00–0.07)
BASOS PCT: 1 %
Basophils Absolute: 0.1 10*3/uL (ref 0.0–0.1)
EOS ABS: 0.2 10*3/uL (ref 0.0–0.5)
Eosinophils Relative: 2 %
HCT: 33.2 % — ABNORMAL LOW (ref 36.0–46.0)
Hemoglobin: 9.9 g/dL — ABNORMAL LOW (ref 12.0–15.0)
Immature Granulocytes: 0 %
Lymphocytes Relative: 31 %
Lymphs Abs: 2.6 10*3/uL (ref 0.7–4.0)
MCH: 28 pg (ref 26.0–34.0)
MCHC: 29.8 g/dL — ABNORMAL LOW (ref 30.0–36.0)
MCV: 93.8 fL (ref 80.0–100.0)
MONO ABS: 0.7 10*3/uL (ref 0.1–1.0)
MONOS PCT: 8 %
Neutro Abs: 4.7 10*3/uL (ref 1.7–7.7)
Neutrophils Relative %: 58 %
PLATELETS: 275 10*3/uL (ref 150–400)
RBC: 3.54 MIL/uL — ABNORMAL LOW (ref 3.87–5.11)
RDW: 14.5 % (ref 11.5–15.5)
WBC: 8.2 10*3/uL (ref 4.0–10.5)
nRBC: 0 % (ref 0.0–0.2)

## 2018-10-03 LAB — COMPREHENSIVE METABOLIC PANEL WITH GFR
ALT: 13 U/L (ref 0–44)
AST: 17 U/L (ref 15–41)
Albumin: 4.3 g/dL (ref 3.5–5.0)
Alkaline Phosphatase: 54 U/L (ref 38–126)
Anion gap: 5 (ref 5–15)
BUN: 14 mg/dL (ref 8–23)
CO2: 32 mmol/L (ref 22–32)
Calcium: 9.8 mg/dL (ref 8.9–10.3)
Chloride: 101 mmol/L (ref 98–111)
Creatinine, Ser: 0.44 mg/dL (ref 0.44–1.00)
GFR calc Af Amer: >60 mL/min
GFR calc non Af Amer: >60 mL/min
Glucose, Bld: 97 mg/dL (ref 70–99)
Potassium: 4.5 mmol/L (ref 3.5–5.1)
Sodium: 138 mmol/L (ref 135–145)
Total Bilirubin: 0.5 mg/dL (ref 0.3–1.2)
Total Protein: 6.9 g/dL (ref 6.5–8.1)

## 2018-10-03 LAB — LACTATE DEHYDROGENASE: LDH: 152 U/L (ref 98–192)

## 2018-10-03 MED ORDER — IOPAMIDOL (ISOVUE-300) INJECTION 61%
150.0000 mL | Freq: Once | INTRAVENOUS | Status: AC | PRN
  Administered 2018-10-03: 1258 mL via INTRAVENOUS

## 2018-10-05 ENCOUNTER — Encounter: Payer: Self-pay | Admitting: Gastroenterology

## 2018-10-05 ENCOUNTER — Other Ambulatory Visit (HOSPITAL_COMMUNITY): Payer: Self-pay | Admitting: Family Medicine

## 2018-10-05 DIAGNOSIS — Z780 Asymptomatic menopausal state: Secondary | ICD-10-CM

## 2018-10-07 DIAGNOSIS — J441 Chronic obstructive pulmonary disease with (acute) exacerbation: Secondary | ICD-10-CM | POA: Diagnosis not present

## 2018-10-07 DIAGNOSIS — J9801 Acute bronchospasm: Secondary | ICD-10-CM | POA: Diagnosis not present

## 2018-10-09 DIAGNOSIS — J441 Chronic obstructive pulmonary disease with (acute) exacerbation: Secondary | ICD-10-CM | POA: Diagnosis not present

## 2018-10-09 DIAGNOSIS — J9801 Acute bronchospasm: Secondary | ICD-10-CM | POA: Diagnosis not present

## 2018-10-10 ENCOUNTER — Other Ambulatory Visit (HOSPITAL_COMMUNITY): Payer: Self-pay | Admitting: Family Medicine

## 2018-10-10 ENCOUNTER — Ambulatory Visit (HOSPITAL_COMMUNITY): Payer: Medicare Other | Admitting: Internal Medicine

## 2018-10-10 DIAGNOSIS — N6310 Unspecified lump in the right breast, unspecified quadrant: Secondary | ICD-10-CM

## 2018-10-16 ENCOUNTER — Other Ambulatory Visit (HOSPITAL_COMMUNITY): Payer: Self-pay | Admitting: Internal Medicine

## 2018-10-16 ENCOUNTER — Encounter (HOSPITAL_COMMUNITY): Payer: Self-pay | Admitting: Internal Medicine

## 2018-10-16 ENCOUNTER — Inpatient Hospital Stay (HOSPITAL_BASED_OUTPATIENT_CLINIC_OR_DEPARTMENT_OTHER): Payer: Medicare Other | Admitting: Internal Medicine

## 2018-10-16 VITALS — BP 173/56 | HR 87 | Temp 98.4°F | Resp 20 | Wt 101.7 lb

## 2018-10-16 DIAGNOSIS — F17210 Nicotine dependence, cigarettes, uncomplicated: Secondary | ICD-10-CM

## 2018-10-16 DIAGNOSIS — E785 Hyperlipidemia, unspecified: Secondary | ICD-10-CM | POA: Diagnosis not present

## 2018-10-16 DIAGNOSIS — I10 Essential (primary) hypertension: Secondary | ICD-10-CM | POA: Diagnosis not present

## 2018-10-16 DIAGNOSIS — D509 Iron deficiency anemia, unspecified: Secondary | ICD-10-CM | POA: Diagnosis not present

## 2018-10-16 DIAGNOSIS — E43 Unspecified severe protein-calorie malnutrition: Secondary | ICD-10-CM

## 2018-10-16 DIAGNOSIS — Z9981 Dependence on supplemental oxygen: Secondary | ICD-10-CM

## 2018-10-16 DIAGNOSIS — J9621 Acute and chronic respiratory failure with hypoxia: Secondary | ICD-10-CM

## 2018-10-16 DIAGNOSIS — Z79899 Other long term (current) drug therapy: Secondary | ICD-10-CM | POA: Diagnosis not present

## 2018-10-16 DIAGNOSIS — I700 Atherosclerosis of aorta: Secondary | ICD-10-CM | POA: Diagnosis not present

## 2018-10-16 DIAGNOSIS — R911 Solitary pulmonary nodule: Secondary | ICD-10-CM | POA: Diagnosis not present

## 2018-10-16 DIAGNOSIS — R630 Anorexia: Secondary | ICD-10-CM

## 2018-10-16 DIAGNOSIS — D508 Other iron deficiency anemias: Secondary | ICD-10-CM

## 2018-10-16 DIAGNOSIS — J439 Emphysema, unspecified: Secondary | ICD-10-CM

## 2018-10-16 DIAGNOSIS — C052 Malignant neoplasm of uvula: Secondary | ICD-10-CM | POA: Diagnosis not present

## 2018-10-16 DIAGNOSIS — R634 Abnormal weight loss: Secondary | ICD-10-CM

## 2018-10-16 NOTE — Progress Notes (Signed)
Diagnosis Other iron deficiency anemia - Plan: CBC with Differential/Platelet, Comprehensive metabolic panel, Lactate dehydrogenase, Protein electrophoresis, serum, Ferritin  Solitary pulmonary nodule - Plan: CT CHEST W CONTRAST, CBC with Differential/Platelet, Comprehensive metabolic panel, Lactate dehydrogenase, Protein electrophoresis, serum, Ferritin  Staging Cancer Staging No matching staging information was found for the patient.  Assessment and Plan:   1.  Pulmonary nodule.  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  She has a long smoking history.  Pet scan that was done on 12/25/2017 showed IMPRESSION: 15 mm mildly thick-walled cavitary lesion in the posterior right upper lobe, nonspecific. Differential considerations include squamous cell neoplasm/metastasis or infection. Follow-up CT chest is suggested in 3-6 months.  Linear/patchy opacity in the left lower lobe, favoring infectious/inflammatory scarring. Attention on follow-up is suggested.  Otherwise, no findings suspicious for recurrent or metastatic Disease.  Based on her prior history of malignancy and long smoking history she was referred to Dr. Roxan Hockey of CT surgery for evaluation.    She was seen by Dr. Roxan Hockey on 02/08/2018 and his review of scans showed a small cavitary nodule in the posterior aspect of the right upper lobe found on PET/CT.  Differential diagnosis includes primary lung cancer, metastatic cancer, and infection.   She is not a surgical candidate.  She was felt to be high risk for pneumothorax with a CT-guided biopsy.  Navigational bronchoscopy would have a very low yield and would require general anesthesia.  She is a poor candidate for that as well.  She says that she would not agree to be put to sleep under any circumstances.  He recommended RT referral which she has remained resistant to.    Pt had  repeat imaging done 10/03/2018 that was reviewed and CT of neck showed IMPRESSION: 1. Normalization of previously demonstrated hyperenhancement of the distal uvula, possible prior surgical truncation. 2. No evidence for metastatic disease in the neck. 3. Aortic Atherosclerosis (ICD10-I70.0) and Emphysema (ICD10-J43.9).  CT chest done 10/03/2018 reviewed and showed IMPRESSION: 1. Nodular lesion with cystic components in right upper lobe is stable from 05/04/2018 but largely new from 07/01/2016. Low-grade adenocarcinoma can not be excluded. 2.  Emphysema (ICD10-J43.9). 3. Aortic atherosclerosis (ICD10-170.0).   Lung lesion measures 1.1 x 1.6 cm.    I have again discussed with her she is recommended for RT evaluation as findings on lung  imaging are suspicious for malignancy especially based on long standing history of smoking and CT surgery review.  She reports she has stopped smoking in 06/2018.  She is oxygen dependent.  She will be referred for RT evaluation in [place].  Pending their evaluation, she will be set up for repeat imaging in 01/2019 and will follow-up at that time to go over labs and scan results.  All questions answered and she expressed understanding of the information presented.  2.  Squamous cell carcinoma of the uvula, suspicious for invasion.  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  She was followed by Dr. Talbert Cage.  She continues to smoke.  She was complaining of weight loss, fatigue, anorexia.  Based on her history and ongoing smoking she underwent Pet imaging  for further evaluation on 12/25/2017 and showed pulmonary nodule in RUL.  She has been seen by Dr. Roxan Hockey and was referred to RT for evaluation.  Pt did not follow-up with RT as recommended.  CT neck done 10/03/2018  reviewed and showed IMPRESSION: 1. Normalization of previously demonstrated hyperenhancement of the distal uvula, possible prior surgical truncation. 2. No evidence for metastatic  disease in the neck.  Pt should follow-up with ENT as recommended.    3.  Anorexia and weight loss.  WT has improved and she weighs 101.  She reports she stopped smoking in 06/2018.  Continue to follow-up with nutrition as recommended.    4.  Anemia.  Labs done 10/03/2018 reviewed and showed WBC 8.2 HB 9.9 plts 275,000.  Chemistries WNL with K+ 4 Cr 0.40 and normal LFTs.   Awaiting Ferritin results.  SPEP negative in 11/2017.  [name]  She will have repeat labs on RTC in 01/2019.    5.  Smoking.  Cessation is recommended.  Pt reports she stopped smoking in 01/2019.  Pt is oxygen dependent.    6.  HTN.  BP is 173/56.  Follow-up with PCP.    25 minutes spent with more than 50% spent in counseling and coordination of care.    Interval History:  Historical data obtained from note dated 03/09/2018.  73 y.o. female followed for squamous cell carcinoma of the uvula, suspicious for invasion.  She was followed by Dr. Talbert Cage and was seen for evaluation due to severe sore throat which persisted through antibiotics. She underwent a laryngoscopy on 06/08/2016 with Dr. Benjamine Mola showing severe posterior laryngeal edema. 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 She then underwent a direct laryngoscopy and biopsy of her uvula on 06/08/2016 with Dr. Benjamine Mola. Pathology of the uvula revealed squamous cell carcinoma suspicious for invasion.  She was hospitalized from 02/16/2017 through 02/23/2017 for COPD exacerbation. She was in rehabilitation after that discharge.    She has not seen Dr. Benjamine Mola in the past year and is overdue for follow-up visit. She has long smoking history.    Current Status: Patient is seen today for follow-up.  She is here to go over labs and scans.  She is accompanied by facility caretaker.    PATHOLOGY:  06/08/2016:       Problem List Patient Active Problem List   Diagnosis Date Noted  . Acute on chronic  respiratory failure with hypoxia ([place]) [J96.21] 05/04/2018  . Severe protein-calorie malnutrition ([place]) [E43] 05/04/2018  . Closed right hip fracture ([place]) greater Trochanter 07/13/17 [S72.001A] 07/13/2017  . Vomiting and diarrhea [R11.10, R19.7] 07/13/2017  . Fall at home, initial encounter [W19.Merril Abbe, J69.678] 07/13/2017  . Hip fracture ([place]) [S72.009A] 07/13/2017  . COPD with acute exacerbation ([place]) [J44.1] 02/16/2017  . Tobacco abuse [Z72.0] 02/16/2017  . Malnutrition of moderate degree [E44.0] 09/22/2016  . Hypomagnesemia [E83.42] 09/21/2016  . Tobacco use [Z72.0] 08/03/2016  . Hyperlipidemia [E78.5] 08/03/2016  . Hypertension [I10] 08/03/2016  . History of Helicobacter pylori infection [Z86.19] 08/03/2016  . History of anemia [Z86.2] 08/03/2016  . Diverticulosis [K57.90] 08/03/2016  . Arthritis [M19.90] 08/03/2016  . Carcinoma of uvula ([place]) [C05.2] 07/13/2016  . Chronic obstructive pulmonary disease with acute exacerbation ([place]) [J44.1]   . Acute respiratory failure with hypoxia ([place]) [J96.01] 04/03/2015  . COPD exacerbation ([place]) [J44.1] 04/03/2015  . Hyponatremia [E87.1] 04/03/2015  . Dehydration [E86.0] 04/03/2015  . Hyperglycemia [R73.9] 04/03/2015  . Anxiety [F41.9]   . COPD (chronic obstructive pulmonary disease) ([place]) [J44.9]   . Peripheral neuropathy ([place]) [G62.9]   . Tobacco dependence [F17.200]   . Back pain, chronic [M54.9, G89.29] 04/19/2013  . Abnormal weight loss [R63.4]  02/19/2013  . Anemia, iron deficiency [D50.9] 02/19/2013  . GERD (gastroesophageal reflux disease) [K21.9] 02/19/2013    Past Medical History Past Medical History:  Diagnosis Date  . Acute respiratory failure with hypoxia ([place])   . Anxiety   . Cancer (HCC)    uvular per pt  . COPD (chronic obstructive pulmonary disease) ([place])   . DDD (degenerative disc disease), lumbar   . Degenerative joint disease of spine   . Diverticulosis   . GERD (gastroesophageal reflux disease)   . H. pylori  infection    treated 02/2013.  [name] History of UTI   . Hypercholesterolemia   . Hypertension   . Hyponatremia   . Peripheral neuropathy   . Shortness of breath    with exertion  . Tobacco abuse     Past Surgical History Past Surgical History:  Procedure Laterality Date  . ABDOMINAL EXPLORATION SURGERY     age 37, large ovarian cyst  . BIOPSY N/A 03/01/2013   WGY:KZLD hiatal hernia; otherwise, normal examination/ Status post biopsies as described above. SB bx negative for Celiac. +h/pylori  . CATARACT EXTRACTION W/ INTRAOCULAR LENS  IMPLANT, BILATERAL    . COLONOSCOPY  10/16/09   Jenkins:3 polypsin the sigmoid colon/polyps in the descending colon and the rectum/scattered diverticulum. hyperplastic  . COLONOSCOPY WITH ESOPHAGOGASTRODUODENOSCOPY (EGD) N/A 03/01/2013   JTT:[id number] colonic polyps - treated/removed as described above. Colonic diverticulosis. Hyperplastic. Next TCS 02/2018.  [name] DIRECT LARYNGOSCOPY N/A 06/08/2016   Procedure: DIRECT LARYNGOSCOPY AND BIOPSY;  Surgeon: Leta Baptist, MD;  Location: [facility] OR;  Service: ENT;  Laterality: N/A;  . PARTIAL HYSTERECTOMY     age 59  . UVULECTOMY      Family History Family History  Problem Relation Age of Onset  . Leukemia Father   . Thyroid disease Father   . Stomach cancer Paternal Grandfather   . Colon cancer Neg Hx      Social History  reports that she has been smoking cigarettes. She has a 75.00 pack-year smoking history. She has never used smokeless tobacco. She reports current alcohol use. She reports that she does not use drugs.  Medications  Current Outpatient Medications:  .  acetaminophen (TYLENOL) 325 MG tablet, Take 650 mg by mouth every 6 (six) hours as needed., Disp: , Rfl:  .  albuterol (PROVENTIL, VENTOLIN) (5 MG/ML) 0.5% NEBU, Take 1.25 mg by nebulization continuous., Disp: , Rfl:  .  amLODipine (NORVASC) 5 MG tablet, TAKE 1 TABLET BY MOUTH ONCE A DAY., Disp: 30 tablet, Rfl: 1 .  atorvastatin (LIPITOR) 10 MG tablet, Take  10 mg by mouth daily., Disp: , Rfl:  .  fluticasone furoate-vilanterol (BREO ELLIPTA) 100-25 MCG/INH AEPB, Inhale 1 puff into the lungs daily., Disp: , Rfl:  .  nicotine (NICODERM CQ - DOSED IN MG/24 HOURS) 14 mg/24hr patch, Place 14 mg onto the skin daily., Disp: , Rfl:  .  triamcinolone cream (KENALOG) 0.5 %, Apply 1 application topically 3 (three) times daily., Disp: , Rfl:  .  Vitamin D, Ergocalciferol, (DRISDOL) 1.25 MG (50000 UT) CAPS capsule, Take 50,000 Units by mouth every 7 (seven) days., Disp: , Rfl:   Allergies Prednisone; Chantix [varenicline]; and Codeine  Review of Systems Review of Systems - Oncology ROS negative   Physical Exam  Vitals Wt Readings from Last 3 Encounters:  10/16/18 101 lb 11.2 oz (46.1 kg)  05/04/18 74 lb 8.2 oz (33.8 kg)  03/09/18 80 lb 3.2 oz (36.4 kg)   Temp Readings from Last  3 Encounters:  10/16/18 98.4 F (36.9 C) (Oral)  05/06/18 97.8 F (36.6 C) (Oral)  03/09/18 98.1 F (36.7 C) (Oral)   BP Readings from Last 3 Encounters:  10/16/18 (!) 173/56  05/06/18 (!) 112/55  03/09/18 (!) 139/46   Pulse Readings from Last 3 Encounters:  10/16/18 87  05/06/18 (!) 58  03/09/18 91   Constitutional: Well-developed, well-nourished, and in no distress.   HENT: Head: Normocephalic and atraumatic. O2 via [place] Mouth/Throat: No oropharyngeal exudate. Mucosa moist. Eyes: Pupils are equal, round, and reactive to light. Conjunctivae are normal. No scleral icterus.  Neck: Normal range of motion. Neck supple. No JVD present.  Cardiovascular: Normal rate, regular rhythm and normal heart sounds.  Exam reveals no gallop and no friction rub.   No murmur heard. Pulmonary/Chest: Coarse BS.  Pt Oxygen dependent Abdominal: Soft. Bowel sounds are normal. No distension. There is no tenderness. There is no guarding.  Musculoskeletal: No edema or tenderness.  Lymphadenopathy: No cervical, axillary or supraclavicular adenopathy.  Neurological: Alert and oriented to  person, place, and time. No cranial nerve deficit.  Skin: Skin is warm and dry. No rash noted. No erythema. No pallor.  Psychiatric: Affect and judgment normal.   Labs No visits with results within 3 Day(s) from this visit.  Latest known visit with results is:  Appointment on 10/03/2018  Component Date Value Ref Range Status  . WBC 10/03/2018 8.2  4.0 - 10.5 K/uL Final  . RBC 10/03/2018 3.54* 3.87 - 5.11 MIL/uL Final  . Hemoglobin 10/03/2018 9.9* 12.0 - 15.0 g/dL Final  . HCT 10/03/2018 33.2* 36.0 - 46.0 % Final  . MCV 10/03/2018 93.8  80.0 - 100.0 fL Final  . MCH 10/03/2018 28.0  26.0 - 34.0 pg Final  . MCHC 10/03/2018 29.8* 30.0 - 36.0 g/dL Final  . RDW 10/03/2018 14.5  11.5 - 15.5 % Final  . Platelets 10/03/2018 275  150 - 400 K/uL Final  . nRBC 10/03/2018 0.0  0.0 - 0.2 % Final  . Neutrophils Relative % 10/03/2018 58  % Final  . Neutro Abs 10/03/2018 4.7  1.7 - 7.7 K/uL Final  . Lymphocytes Relative 10/03/2018 31  % Final  . Lymphs Abs 10/03/2018 2.6  0.7 - 4.0 K/uL Final  . Monocytes Relative 10/03/2018 8  % Final  . Monocytes Absolute 10/03/2018 0.7  0.1 - 1.0 K/uL Final  . Eosinophils Relative 10/03/2018 2  % Final  . Eosinophils Absolute 10/03/2018 0.2  0.0 - 0.5 K/uL Final  . Basophils Relative 10/03/2018 1  % Final  . Basophils Absolute 10/03/2018 0.1  0.0 - 0.1 K/uL Final  . Immature Granulocytes 10/03/2018 0  % Final  . Abs Immature Granulocytes 10/03/2018 0.03  0.00 - 0.07 K/uL Final   Performed at [facility], [address] [place], [place] [address]  . Sodium 10/03/2018 138  135 - 145 mmol/L Final  . Potassium 10/03/2018 4.5  3.5 - 5.1 mmol/L Final  . Chloride 10/03/2018 101  98 - 111 mmol/L Final  . CO2 10/03/2018 32  22 - 32 mmol/L Final  . Glucose, Bld 10/03/2018 97  70 - 99 mg/dL Final  . BUN 10/03/2018 14  8 - 23 mg/dL Final  . Creatinine, Ser 10/03/2018 0.44  0.44 - 1.00 mg/dL Final  . Calcium 10/03/2018 9.8  8.9 - 10.3 mg/dL Final  . Total Protein  10/03/2018 6.9  6.5 - 8.1 g/dL Final  . Albumin 10/03/2018 4.3  3.5 - 5.0 g/dL Final  .  AST 10/03/2018 17  15 - 41 U/L Final  . ALT 10/03/2018 13  0 - 44 U/L Final  . Alkaline Phosphatase 10/03/2018 54  38 - 126 U/L Final  . Total Bilirubin 10/03/2018 0.5  0.3 - 1.2 mg/dL Final  . GFR calc non Af Amer 10/03/2018 >60  >60 mL/min Final  . GFR calc Af Amer 10/03/2018 >60  >60 mL/min Final  . Anion gap 10/03/2018 5  5 - 15 Final   Performed at [facility], [address], [place], [place] [address]  . LDH 10/03/2018 152  98 - 192 U/L Final   Performed at [facility], [address] [place], [place] [address]     Pathology Orders Placed This Encounter  Procedures  . CT CHEST W CONTRAST    Standing Status:   Future    Standing Expiration Date:   10/16/2019    Order Specific Question:   If indicated for the ordered procedure, I authorize the administration of contrast media per Radiology protocol    Answer:   Yes    Order Specific Question:   Preferred imaging location?    Answer:   [facility]    Order Specific Question:   Radiology Contrast Protocol - do NOT remove file path    Answer:   [email or web address]  . CBC with Differential/Platelet    Standing Status:   Future    Standing Expiration Date:   10/17/2019  . Comprehensive metabolic panel    Standing Status:   Future    Standing Expiration Date:   10/17/2019  . Lactate dehydrogenase    Standing Status:   Future    Standing Expiration Date:   10/17/2019  . Protein electrophoresis, serum    Standing Status:   Future    Standing Expiration Date:   10/17/2019  . Ferritin    Standing Status:   Future    Standing Expiration Date:   10/17/2019       Zoila Shutter MD

## 2018-10-16 NOTE — Progress Notes (Signed)
Faxed referral and patient records to [facility] [phone].

## 2018-10-17 ENCOUNTER — Other Ambulatory Visit (HOSPITAL_COMMUNITY): Payer: Self-pay | Admitting: Internal Medicine

## 2018-10-17 ENCOUNTER — Telehealth (HOSPITAL_COMMUNITY): Payer: Self-pay | Admitting: Internal Medicine

## 2018-10-17 ENCOUNTER — Inpatient Hospital Stay (HOSPITAL_COMMUNITY): Payer: Medicare Other

## 2018-10-17 DIAGNOSIS — E785 Hyperlipidemia, unspecified: Secondary | ICD-10-CM | POA: Diagnosis not present

## 2018-10-17 DIAGNOSIS — R911 Solitary pulmonary nodule: Secondary | ICD-10-CM | POA: Diagnosis not present

## 2018-10-17 DIAGNOSIS — D508 Other iron deficiency anemias: Secondary | ICD-10-CM

## 2018-10-17 DIAGNOSIS — Z9981 Dependence on supplemental oxygen: Secondary | ICD-10-CM | POA: Diagnosis not present

## 2018-10-17 DIAGNOSIS — J439 Emphysema, unspecified: Secondary | ICD-10-CM | POA: Diagnosis not present

## 2018-10-17 DIAGNOSIS — J9621 Acute and chronic respiratory failure with hypoxia: Secondary | ICD-10-CM | POA: Diagnosis not present

## 2018-10-17 DIAGNOSIS — D509 Iron deficiency anemia, unspecified: Secondary | ICD-10-CM | POA: Diagnosis not present

## 2018-10-17 DIAGNOSIS — Z79899 Other long term (current) drug therapy: Secondary | ICD-10-CM | POA: Diagnosis not present

## 2018-10-17 DIAGNOSIS — E43 Unspecified severe protein-calorie malnutrition: Secondary | ICD-10-CM | POA: Diagnosis not present

## 2018-10-17 DIAGNOSIS — I10 Essential (primary) hypertension: Secondary | ICD-10-CM | POA: Diagnosis not present

## 2018-10-17 DIAGNOSIS — C052 Malignant neoplasm of uvula: Secondary | ICD-10-CM | POA: Diagnosis not present

## 2018-10-17 DIAGNOSIS — I700 Atherosclerosis of aorta: Secondary | ICD-10-CM | POA: Diagnosis not present

## 2018-10-17 LAB — CBC WITH DIFFERENTIAL/PLATELET
Abs Immature Granulocytes: 0.02 10*3/uL (ref 0.00–0.07)
BASOS ABS: 0 10*3/uL (ref 0.0–0.1)
Basophils Relative: 1 %
EOS ABS: 0.1 10*3/uL (ref 0.0–0.5)
Eosinophils Relative: 1 %
HCT: 31.6 % — ABNORMAL LOW (ref 36.0–46.0)
HEMOGLOBIN: 9.8 g/dL — AB (ref 12.0–15.0)
IMMATURE GRANULOCYTES: 0 %
LYMPHS ABS: 2.1 10*3/uL (ref 0.7–4.0)
LYMPHS PCT: 28 %
MCH: 28.8 pg (ref 26.0–34.0)
MCHC: 31 g/dL (ref 30.0–36.0)
MCV: 92.9 fL (ref 80.0–100.0)
Monocytes Absolute: 0.6 10*3/uL (ref 0.1–1.0)
Monocytes Relative: 8 %
NEUTROS PCT: 62 %
NRBC: 0 % (ref 0.0–0.2)
Neutro Abs: 4.6 10*3/uL (ref 1.7–7.7)
Platelets: 252 10*3/uL (ref 150–400)
RBC: 3.4 MIL/uL — AB (ref 3.87–5.11)
RDW: 14.4 % (ref 11.5–15.5)
WBC: 7.5 10*3/uL (ref 4.0–10.5)

## 2018-10-17 LAB — COMPREHENSIVE METABOLIC PANEL
ALBUMIN: 4.1 g/dL (ref 3.5–5.0)
ALT: 12 U/L (ref 0–44)
ANION GAP: 7 (ref 5–15)
AST: 17 U/L (ref 15–41)
Alkaline Phosphatase: 52 U/L (ref 38–126)
BUN: 17 mg/dL (ref 8–23)
CALCIUM: 10 mg/dL (ref 8.9–10.3)
CO2: 32 mmol/L (ref 22–32)
CREATININE: 0.52 mg/dL (ref 0.44–1.00)
Chloride: 98 mmol/L (ref 98–111)
GFR calc Af Amer: >60 mL/min (ref >60)
GFR calc non Af Amer: >60 mL/min (ref >60)
Glucose, Bld: 88 mg/dL (ref 70–99)
POTASSIUM: 4.5 mmol/L (ref 3.5–5.1)
SODIUM: 137 mmol/L (ref 135–145)
TOTAL PROTEIN: 6.7 g/dL (ref 6.5–8.1)
Total Bilirubin: 0.4 mg/dL (ref 0.3–1.2)

## 2018-10-17 LAB — FERRITIN: FERRITIN: 27 ng/mL (ref 11–307)

## 2018-10-17 LAB — LACTATE DEHYDROGENASE: LDH: 149 U/L (ref 98–192)

## 2018-10-17 NOTE — Telephone Encounter (Signed)
Notified staff to inform pt iron levels are low.  Pt has intolerance for oral iron. Set up for [facility].  Orders placed.

## 2018-10-18 ENCOUNTER — Other Ambulatory Visit (HOSPITAL_COMMUNITY): Payer: Medicare Other

## 2018-10-18 ENCOUNTER — Encounter (HOSPITAL_COMMUNITY): Payer: Self-pay

## 2018-10-18 LAB — PROTEIN ELECTROPHORESIS, SERUM
A/G RATIO SPE: 1.8 — AB (ref 0.7–1.7)
ALPHA-1-GLOBULIN: 0.2 g/dL (ref 0.0–0.4)
ALPHA-2-GLOBULIN: 0.6 g/dL (ref 0.4–1.0)
Albumin ELP: 4.2 g/dL (ref 2.9–4.4)
BETA GLOBULIN: 0.9 g/dL (ref 0.7–1.3)
Gamma Globulin: 0.6 g/dL (ref 0.4–1.8)
Globulin, Total: 2.3 g/dL (ref 2.2–3.9)
Total Protein ELP: 6.5 g/dL (ref 6.0–8.5)

## 2018-10-19 ENCOUNTER — Encounter (HOSPITAL_COMMUNITY): Payer: Self-pay

## 2018-10-19 ENCOUNTER — Emergency Department (HOSPITAL_COMMUNITY): Payer: Medicare Other

## 2018-10-19 ENCOUNTER — Other Ambulatory Visit: Payer: Self-pay

## 2018-10-19 ENCOUNTER — Emergency Department (HOSPITAL_COMMUNITY)
Admission: EM | Admit: 2018-10-19 | Discharge: 2018-10-19 | Disposition: A | Discharge status: Home / Self Care | Payer: Medicare Other | Attending: Emergency Medicine | Admitting: Emergency Medicine

## 2018-10-19 DIAGNOSIS — Z87891 Personal history of nicotine dependence: Secondary | ICD-10-CM | POA: Diagnosis not present

## 2018-10-19 DIAGNOSIS — R0981 Nasal congestion: Secondary | ICD-10-CM | POA: Diagnosis not present

## 2018-10-19 DIAGNOSIS — R05 Cough: Secondary | ICD-10-CM | POA: Diagnosis not present

## 2018-10-19 DIAGNOSIS — Z79899 Other long term (current) drug therapy: Secondary | ICD-10-CM | POA: Insufficient documentation

## 2018-10-19 DIAGNOSIS — R000 Tachycardia, unspecified: Secondary | ICD-10-CM | POA: Diagnosis not present

## 2018-10-19 DIAGNOSIS — Z9981 Dependence on supplemental oxygen: Secondary | ICD-10-CM | POA: Insufficient documentation

## 2018-10-19 DIAGNOSIS — J441 Chronic obstructive pulmonary disease with (acute) exacerbation: Secondary | ICD-10-CM | POA: Diagnosis not present

## 2018-10-19 DIAGNOSIS — I10 Essential (primary) hypertension: Secondary | ICD-10-CM | POA: Insufficient documentation

## 2018-10-19 DIAGNOSIS — Z85818 Personal history of malignant neoplasm of other sites of lip, oral cavity, and pharynx: Secondary | ICD-10-CM | POA: Diagnosis not present

## 2018-10-19 DIAGNOSIS — R0602 Shortness of breath: Secondary | ICD-10-CM | POA: Diagnosis not present

## 2018-10-19 MED ORDER — TRIAMCINOLONE ACETONIDE 55 MCG/ACT NA AERO: 2.0000 | INHALATION_SPRAY | Freq: Every day | NASAL | 0 refills | Status: DC

## 2018-10-19 MED ORDER — ALBUTEROL SULFATE (2.5 MG/3ML) 0.083% IN NEBU
5.0000 mg | INHALATION_SOLUTION | Freq: Once | RESPIRATORY_TRACT | Status: AC
  Administered 2018-10-19: 5 mg via RESPIRATORY_TRACT
  Filled 2018-10-19: qty 6

## 2018-10-19 MED ORDER — DEXAMETHASONE SODIUM PHOSPHATE 10 MG/ML IJ SOLN
10.0000 mg | Freq: Once | INTRAMUSCULAR | Status: AC
  Administered 2018-10-19: 10 mg via INTRAMUSCULAR
  Filled 2018-10-19: qty 1

## 2018-10-19 NOTE — ED Provider Notes (Signed)
[facility] EMERGENCY DEPARTMENT Provider Note   CSN: [id number] Arrival date & time: 10/19/18  1217     History   Chief Complaint Chief Complaint  Patient presents with  . Shortness of Breath    HPI Lindsey Combs is a 73 y.o. female.  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 Patient notes that she stopped smoking several months ago, but lives with a current smoker. Today, over the past 7 hours or so she has had dyspnea, in spite of using her home oxygen No fever, no vomiting, no chest pain, no abdominal pain, no vomiting, no diarrhea.  She does complain of sinus congestion as well. She had transient improvement with albuterol earlier today, but with persistency of symptoms she presents for evaluation.  Notes that in regards to her upper lobe mass she is scheduled for follow-up next week with a new radiologist.  Past Medical History:  Diagnosis Date  . Acute respiratory failure with hypoxia ([place])   . Anxiety   . Cancer (HCC)    uvular per pt  . COPD (chronic obstructive pulmonary disease) ([place])   . DDD (degenerative disc disease), lumbar   . Degenerative joint disease of spine   . Diverticulosis   . GERD (gastroesophageal reflux disease)   . H. pylori infection    treated 02/2013.  [name] History of UTI   . Hypercholesterolemia   . Hypertension   . Hyponatremia   . Peripheral neuropathy   . Shortness of breath    with exertion  . Tobacco abuse     Patient Active Problem List   Diagnosis Date Noted  . Acute on chronic respiratory failure with hypoxia ([place]) 05/04/2018  . Severe protein-calorie malnutrition ([place]) 05/04/2018  . Closed right hip fracture ([place]) greater Trochanter 07/13/17 07/13/2017  . Vomiting and diarrhea 07/13/2017  . Fall at home, initial encounter 07/13/2017  . Hip fracture ([place]) 07/13/2017    . COPD with acute exacerbation ([place]) 02/16/2017  . Tobacco abuse 02/16/2017  . Malnutrition of moderate degree 09/22/2016  . Hypomagnesemia 09/21/2016  . Tobacco use 08/03/2016  . Hyperlipidemia 08/03/2016  . Hypertension 08/03/2016  . History of Helicobacter pylori infection 08/03/2016  . History of anemia 08/03/2016  . Diverticulosis 08/03/2016  . Arthritis 08/03/2016  . 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 Chronic obstructive pulmonary disease with acute exacerbation ([place])   . Acute respiratory failure with hypoxia ([place]) 04/03/2015  . COPD exacerbation ([place]) 04/03/2015  . Hyponatremia 04/03/2015  . Dehydration 04/03/2015  . Hyperglycemia 04/03/2015  . Anxiety   . COPD (chronic obstructive pulmonary disease) ([place])   . Peripheral neuropathy ([place])   . Tobacco dependence   . Back pain, chronic 04/19/2013  . Abnormal weight loss 02/19/2013  . Anemia, iron deficiency 02/19/2013  . GERD (gastroesophageal reflux disease) 02/19/2013    Past Surgical History:  Procedure Laterality Date  . ABDOMINAL EXPLORATION SURGERY     age 32, large ovarian cyst  . BIOPSY N/A 03/01/2013   ZDG:LOVF hiatal hernia; otherwise, normal examination/ Status post biopsies as described above. SB bx negative for Celiac. +h/pylori  . CATARACT EXTRACTION W/ INTRAOCULAR LENS  IMPLANT, BILATERAL    . COLONOSCOPY  10/16/09   Jenkins:3 polypsin the sigmoid colon/polyps in the descending colon and the rectum/scattered diverticulum. hyperplastic  . COLONOSCOPY WITH ESOPHAGOGASTRODUODENOSCOPY (EGD) N/A 03/01/2013   IEP:PIRJJOAC colonic polyps - treated/removed  as described above. Colonic diverticulosis. Hyperplastic. Next TCS 02/2018.  [name] DIRECT LARYNGOSCOPY N/A 06/08/2016   Procedure: DIRECT LARYNGOSCOPY AND BIOPSY;  Surgeon: Leta Baptist, MD;  Location: [facility] OR;  Service: ENT;  Laterality: N/A;  . PARTIAL HYSTERECTOMY     age 65  . UVULECTOMY       OB History    Gravida  5   Para  4   Term  4   Preterm      AB   1   Living        SAB  1   TAB      Ectopic      Multiple      Live Births               Home Medications    Prior to Admission medications   Medication Sig Start Date End Date Taking? Authorizing Provider  acetaminophen (TYLENOL) 325 MG tablet Take 650 mg by mouth every 6 (six) hours as needed.    [provider]  albuterol (PROVENTIL, VENTOLIN) (5 MG/ML) 0.5% NEBU Take 1.25 mg by nebulization continuous.    [provider]  amLODipine (NORVASC) 5 MG tablet TAKE 1 TABLET BY MOUTH ONCE A DAY. 01/16/17   Baird Cancer, PA-C  atorvastatin (LIPITOR) 10 MG tablet Take 10 mg by mouth daily.    [provider]  fluticasone furoate-vilanterol (BREO ELLIPTA) 100-25 MCG/INH AEPB Inhale 1 puff into the lungs daily.    [provider]  nicotine (NICODERM CQ - DOSED IN MG/24 HOURS) 14 mg/24hr patch Place 14 mg onto the skin daily.    [provider]  triamcinolone cream (KENALOG) 0.5 % Apply 1 application topically 3 (three) times daily.    [provider]  Vitamin D, Ergocalciferol, (DRISDOL) 1.25 MG (50000 UT) CAPS capsule Take 50,000 Units by mouth every 7 (seven) days.    [provider]    Family History Family History  Problem Relation Age of Onset  . Leukemia Father   . Thyroid disease Father   . Stomach cancer Paternal Grandfather   . Colon cancer Neg Hx     Social History Social History   Tobacco Use  . Smoking status: Former Smoker    Packs/day: 1.50    Years: 50.00    Pack years: 75.00    Types: Cigarettes    Last attempt to quit: 07/16/2018    Years since quitting: 0.2  . Smokeless tobacco: Never Used  Substance Use Topics  . Alcohol use: Yes    Comment: glass of wine occasionally  . Drug use: No     Allergies   Mucinex [guaifenesin er]; Prednisone; Chantix [varenicline]; and Codeine   Review of Systems Review of Systems  Constitutional:       Per HPI, otherwise negative  HENT:        Per HPI, otherwise negative  Respiratory:       Per HPI, otherwise negative  Cardiovascular:       Per HPI, otherwise negative  Gastrointestinal: Negative for vomiting.  Endocrine:       Negative aside from HPI  Genitourinary:       Neg aside from HPI   Musculoskeletal:       Per HPI, otherwise negative  Skin: Negative.   Neurological: Negative for syncope.  Hematological:       Per HPI, ongoing cancer evaluation     Physical Exam Updated Vital Signs BP (!) 176/57 (BP Location: Right Arm)  Pulse (!) 105   Temp 98.7 F (37.1 C) (Oral)   Resp 18   Ht 5\' 6"  (1.676 m)   Wt 45.8 kg   SpO2 96%   BMI 16.30 kg/m   Physical Exam Vitals signs and nursing note reviewed.  Constitutional:      General: She is not in acute distress.    Appearance: She is cachectic. She is not toxic-appearing.  HENT:     Head: Normocephalic and atraumatic.  Eyes:     Conjunctiva/sclera: Conjunctivae normal.  Cardiovascular:     Rate and Rhythm: Regular rhythm. Tachycardia present.  Pulmonary:     Breath sounds: Decreased air movement present. Decreased breath sounds present.     Comments: With supplemental oxygen the patient has oxygen saturation rate of 97% Abdominal:     General: There is no distension.  Skin:    General: Skin is warm and dry.  Neurological:     Mental Status: She is alert and oriented to person, place, and time.     Cranial Nerves: No cranial nerve deficit.     Motor: Atrophy present.      ED Treatments / Results  Labs (all labs ordered are listed, but only abnormal results are displayed) Labs Reviewed  COMPREHENSIVE METABOLIC PANEL  CBC WITH DIFFERENTIAL/PLATELET  TROPONIN I  BRAIN NATRIURETIC PEPTIDE    EKG EKG Interpretation  Date/Time:  Friday October 19 2018 13:09:29 EST Ventricular Rate:  78 PR Interval:    QRS Duration: 90 QT Interval:  394 QTC Calculation: 449 R Axis:   81 Text Interpretation:  Sinus rhythm Probable left atrial  enlargement Borderline right axis deviation RSR' in V1 or V2, probably normal variant Baseline wander in lead(s) V2 V6 Abnormal ekg Confirmed by Carmin Muskrat [phone]) on 10/19/2018 1:30:04 PM   Radiology Dg Chest 2 View  Result Date: 10/19/2018 CLINICAL DATA:  Shortness of breath and coughing, leading to vomiting. On home oxygen. History of COPD. EXAM: CHEST - 2 VIEW COMPARISON:  Radiographs 07/17/2018.  CT 10/03/2018. FINDINGS: The heart size and mediastinal contours are stable. There is aortic atherosclerosis. There is chronic lung disease corresponding with moderate emphysema on CT. Nodular density posteriorly in the right upper lobe is not significantly changed from the recent CT. There is no superimposed airspace disease, pleural effusion or pneumothorax. No acute osseous findings are evident. Midthoracic compression deformity is stable. IMPRESSION: Radiographically stable appearance of the chest without acute findings. Emphysema and indeterminate right upper lobe lesion as discussed on recent chest CT. Electronically Signed   By: Richardean Sale M.D.   On: 10/19/2018 12:43    Procedures Procedures (including critical care time)  Medications Ordered in ED Medications - No data to display   Initial Impression / Assessment and Plan / ED Course  I have reviewed the triage vital signs and the nursing notes.  Pertinent labs & imaging results that were available during my care of the patient were reviewed by me and considered in my medical decision making (see chart for details).  \Take: Following breathing treatment patient feels better, she is received IM steroids as well. X-ray notable for no pneumonia, continue to demonstration of COPD, and mass. With no new oxygen requirement, no distress, no increased work of breathing, low suspicion for PE, though with uncertain malignancy status, this is a consideration. Patient improved here, and given this, there is some suspicion for COPD  exacerbation, possibly complicated by patient's sinus congestion. Patient discharged with a course of ongoing scheduled albuterol,  and a decongestant.   Final Clinical Impressions(s) / ED Diagnoses  COPD exacerbation Sinus congestion   Carmin Muskrat, MD 10/19/18 1430

## 2018-10-19 NOTE — ED Notes (Signed)
Respiratory notified of breathing treatment.

## 2018-10-19 NOTE — ED Triage Notes (Addendum)
Pt reports that she began having difficulty breathing during the night and began coughing until she made herself vomit. Reports chills. Has chronic O2 at 3 L. Pt reports head and chest hurting from coughing. Pt sent from [facility]

## 2018-10-19 NOTE — Discharge Instructions (Signed)
As discussed, today's evaluation has been generally reassuring. For the next 2 days please be sure to use your albuterol every 4 hours, then you may use it as needed. Please use the prescribed decongestant as needed as well.  Of great importance is that you have your follow-up visit for further evaluation of the lung mass, with your new physician.  Return here for concerning changes in your condition.

## 2018-10-25 ENCOUNTER — Encounter (HOSPITAL_COMMUNITY): Payer: Self-pay

## 2018-10-25 ENCOUNTER — Inpatient Hospital Stay (HOSPITAL_COMMUNITY): Payer: Medicare Other

## 2018-10-25 VITALS — BP 147/48 | HR 74 | Temp 98.0°F | Resp 18

## 2018-10-25 DIAGNOSIS — Z9981 Dependence on supplemental oxygen: Secondary | ICD-10-CM | POA: Diagnosis not present

## 2018-10-25 DIAGNOSIS — I10 Essential (primary) hypertension: Secondary | ICD-10-CM | POA: Diagnosis not present

## 2018-10-25 DIAGNOSIS — Z7689 Persons encountering health services in other specified circumstances: Secondary | ICD-10-CM | POA: Diagnosis not present

## 2018-10-25 DIAGNOSIS — D508 Other iron deficiency anemias: Secondary | ICD-10-CM

## 2018-10-25 DIAGNOSIS — E785 Hyperlipidemia, unspecified: Secondary | ICD-10-CM | POA: Diagnosis not present

## 2018-10-25 DIAGNOSIS — I700 Atherosclerosis of aorta: Secondary | ICD-10-CM | POA: Diagnosis not present

## 2018-10-25 DIAGNOSIS — E43 Unspecified severe protein-calorie malnutrition: Secondary | ICD-10-CM | POA: Diagnosis not present

## 2018-10-25 DIAGNOSIS — R911 Solitary pulmonary nodule: Secondary | ICD-10-CM | POA: Diagnosis not present

## 2018-10-25 DIAGNOSIS — C052 Malignant neoplasm of uvula: Secondary | ICD-10-CM | POA: Diagnosis not present

## 2018-10-25 DIAGNOSIS — Z79899 Other long term (current) drug therapy: Secondary | ICD-10-CM | POA: Diagnosis not present

## 2018-10-25 DIAGNOSIS — J9621 Acute and chronic respiratory failure with hypoxia: Secondary | ICD-10-CM | POA: Diagnosis not present

## 2018-10-25 DIAGNOSIS — D509 Iron deficiency anemia, unspecified: Secondary | ICD-10-CM | POA: Diagnosis not present

## 2018-10-25 DIAGNOSIS — J439 Emphysema, unspecified: Secondary | ICD-10-CM | POA: Diagnosis not present

## 2018-10-25 MED ORDER — SODIUM CHLORIDE 0.9 % IV SOLN
510.0000 mg | Freq: Once | INTRAVENOUS | Status: AC
  Administered 2018-10-25: 510 mg via INTRAVENOUS
  Filled 2018-10-25: qty 510

## 2018-10-25 MED ORDER — SODIUM CHLORIDE 0.9 % IV SOLN
Freq: Once | INTRAVENOUS | Status: AC
  Administered 2018-10-25: 08:00:00 via INTRAVENOUS

## 2018-10-25 NOTE — Progress Notes (Signed)
Treatment given per orders. Patient tolerated it well without problems. Vitals stable and discharged back to facility from clinic ambulatory with walker. Follow up as scheduled.

## 2018-10-25 NOTE — Patient Instructions (Signed)
[facility] Discharge Instructions for Patients Receiving Chemotherapy  Today you received the following chemotherapy agents   To help prevent nausea and vomiting after your treatment, we encourage you to take your nausea medication   If you develop nausea and vomiting that is not controlled by your nausea medication, call the clinic.   BELOW ARE SYMPTOMS THAT SHOULD BE REPORTED IMMEDIATELY:  *FEVER GREATER THAN 100.5 F  *CHILLS WITH OR WITHOUT FEVER  NAUSEA AND VOMITING THAT IS NOT CONTROLLED WITH YOUR NAUSEA MEDICATION  *UNUSUAL SHORTNESS OF BREATH  *UNUSUAL BRUISING OR BLEEDING  TENDERNESS IN MOUTH AND THROAT WITH OR WITHOUT PRESENCE OF ULCERS  *URINARY PROBLEMS  *BOWEL PROBLEMS  UNUSUAL RASH Items with * indicate a potential emergency and should be followed up as soon as possible.  Feel free to call the clinic should you have any questions or concerns. The clinic phone number is [phone].  Please show the CHEMO ALERT CARD at check-in to the Emergency Department and triage nurse.   

## 2018-10-30 ENCOUNTER — Ambulatory Visit (HOSPITAL_COMMUNITY)
Admission: RE | Admit: 2018-10-30 | Discharge: 2018-10-30 | Disposition: A | Discharge status: Home / Self Care | Payer: Medicare Other | Source: Ambulatory Visit | Attending: Family Medicine | Admitting: Family Medicine

## 2018-10-30 ENCOUNTER — Ambulatory Visit (HOSPITAL_COMMUNITY): Payer: Medicare Other

## 2018-10-30 DIAGNOSIS — N6314 Unspecified lump in the right breast, lower inner quadrant: Secondary | ICD-10-CM | POA: Insufficient documentation

## 2018-10-30 DIAGNOSIS — N6310 Unspecified lump in the right breast, unspecified quadrant: Secondary | ICD-10-CM | POA: Diagnosis present

## 2018-10-30 DIAGNOSIS — R922 Inconclusive mammogram: Secondary | ICD-10-CM | POA: Diagnosis not present

## 2018-11-01 ENCOUNTER — Encounter (HOSPITAL_COMMUNITY): Payer: Self-pay

## 2018-11-01 ENCOUNTER — Inpatient Hospital Stay (HOSPITAL_COMMUNITY): Payer: Medicare Other | Attending: Hematology

## 2018-11-01 VITALS — BP 135/48 | HR 62 | Temp 98.6°F | Resp 18

## 2018-11-01 DIAGNOSIS — D509 Iron deficiency anemia, unspecified: Secondary | ICD-10-CM | POA: Insufficient documentation

## 2018-11-01 DIAGNOSIS — D508 Other iron deficiency anemias: Secondary | ICD-10-CM

## 2018-11-01 MED ORDER — SODIUM CHLORIDE 0.9 % IV SOLN
510.0000 mg | Freq: Once | INTRAVENOUS | Status: AC
  Administered 2018-11-01: 510 mg via INTRAVENOUS
  Filled 2018-11-01: qty 510

## 2018-11-01 MED ORDER — ACETAMINOPHEN 325 MG PO TABS
650.0000 mg | ORAL_TABLET | Freq: Once | ORAL | Status: AC
  Administered 2018-11-01: 650 mg via ORAL

## 2018-11-01 MED ORDER — ACETAMINOPHEN 325 MG PO TABS
ORAL_TABLET | ORAL | Status: AC
  Filled 2018-11-01: qty 2

## 2018-11-01 MED ORDER — SODIUM CHLORIDE 0.9 % IV SOLN
Freq: Once | INTRAVENOUS | Status: AC
  Administered 2018-11-01: 09:00:00 via INTRAVENOUS

## 2018-11-01 NOTE — Patient Instructions (Signed)
[facility] Discharge Instructions  Received Feraheme infusion today. Follow-up as scheduled. Call clinic for any questions or concerns   Thank you for choosing [facility] at [facility] to provide your oncology and hematology care.  To afford each patient quality time with our provider, please arrive at least 15 minutes before your scheduled appointment time.   If you have a lab appointment with the Cancer Center please come in thru the  Main Entrance and check in at the main information desk  You need to re-schedule your appointment should you arrive 10 or more minutes late.  We strive to give you quality time with our providers, and arriving late affects you and other patients whose appointments are after yours.  Also, if you no show three or more times for appointments you may be dismissed from the clinic at the providers discretion.     Again, thank you for choosing [facility].  Our hope is that these requests will decrease the amount of time that you wait before being seen by our physicians.       _____________________________________________________________  Should you have questions after your visit to [facility], please contact our office at [phone] between the hours of 8:00 a.m. and 4:30 p.m.  Voicemails left after 4:00 p.m. will not be returned until the following business day.  For prescription refill requests, have your pharmacy contact our office and allow 72 hours.    Cancer Center Support Programs:   > Cancer Support Group  2nd Tuesday of the month 1pm-2pm, Journey Room   

## 2018-11-01 NOTE — Progress Notes (Signed)
Lindsey Combs tolerated Feraheme infusion well without complaints or incident. Peripheral IV site checked with positive blood return noted prior to and after infusion. VSS upon discharge.Pt discharged self ambulatory using her walker in satisfactory condition accompanied by caregiver

## 2018-11-06 ENCOUNTER — Other Ambulatory Visit (HOSPITAL_COMMUNITY): Payer: Self-pay | Admitting: Radiation Oncology

## 2018-11-06 DIAGNOSIS — R911 Solitary pulmonary nodule: Secondary | ICD-10-CM | POA: Diagnosis not present

## 2018-11-06 DIAGNOSIS — C052 Malignant neoplasm of uvula: Secondary | ICD-10-CM

## 2018-11-07 DIAGNOSIS — J9801 Acute bronchospasm: Secondary | ICD-10-CM | POA: Diagnosis not present

## 2018-11-07 DIAGNOSIS — J441 Chronic obstructive pulmonary disease with (acute) exacerbation: Secondary | ICD-10-CM | POA: Diagnosis not present

## 2018-11-09 DIAGNOSIS — J441 Chronic obstructive pulmonary disease with (acute) exacerbation: Secondary | ICD-10-CM | POA: Diagnosis not present

## 2018-11-09 DIAGNOSIS — J9801 Acute bronchospasm: Secondary | ICD-10-CM | POA: Diagnosis not present

## 2018-11-23 ENCOUNTER — Emergency Department (HOSPITAL_COMMUNITY): Payer: Medicare Other

## 2018-11-23 ENCOUNTER — Emergency Department (HOSPITAL_COMMUNITY)
Admission: EM | Admit: 2018-11-23 | Discharge: 2018-11-23 | Disposition: A | Discharge status: Home / Self Care | Payer: Medicare Other | Attending: Emergency Medicine | Admitting: Emergency Medicine

## 2018-11-23 ENCOUNTER — Encounter (HOSPITAL_COMMUNITY): Payer: Self-pay | Admitting: Radiology

## 2018-11-23 ENCOUNTER — Other Ambulatory Visit: Payer: Self-pay

## 2018-11-23 DIAGNOSIS — J019 Acute sinusitis, unspecified: Secondary | ICD-10-CM | POA: Diagnosis not present

## 2018-11-23 DIAGNOSIS — R0602 Shortness of breath: Secondary | ICD-10-CM | POA: Diagnosis present

## 2018-11-23 DIAGNOSIS — J329 Chronic sinusitis, unspecified: Secondary | ICD-10-CM

## 2018-11-23 DIAGNOSIS — I10 Essential (primary) hypertension: Secondary | ICD-10-CM | POA: Diagnosis not present

## 2018-11-23 DIAGNOSIS — Z87891 Personal history of nicotine dependence: Secondary | ICD-10-CM | POA: Insufficient documentation

## 2018-11-23 DIAGNOSIS — J449 Chronic obstructive pulmonary disease, unspecified: Secondary | ICD-10-CM | POA: Insufficient documentation

## 2018-11-23 DIAGNOSIS — J439 Emphysema, unspecified: Secondary | ICD-10-CM | POA: Diagnosis not present

## 2018-11-23 DIAGNOSIS — N200 Calculus of kidney: Secondary | ICD-10-CM | POA: Insufficient documentation

## 2018-11-23 DIAGNOSIS — Z79899 Other long term (current) drug therapy: Secondary | ICD-10-CM | POA: Diagnosis not present

## 2018-11-23 DIAGNOSIS — R062 Wheezing: Secondary | ICD-10-CM | POA: Diagnosis not present

## 2018-11-23 LAB — INFLUENZA PANEL BY PCR (TYPE A & B)
Influenza A By PCR: NEGATIVE
Influenza B By PCR: NEGATIVE

## 2018-11-23 LAB — CBC WITH DIFFERENTIAL/PLATELET
Abs Immature Granulocytes: 0.08 10*3/uL — ABNORMAL HIGH (ref 0.00–0.07)
Basophils Absolute: 0.1 10*3/uL (ref 0.0–0.1)
Basophils Relative: 0 %
Eosinophils Absolute: 0.1 10*3/uL (ref 0.0–0.5)
Eosinophils Relative: 1 %
HCT: 36.9 % (ref 36.0–46.0)
Hemoglobin: 11 g/dL — ABNORMAL LOW (ref 12.0–15.0)
Immature Granulocytes: 0 %
Lymphocytes Relative: 9 %
Lymphs Abs: 1.6 10*3/uL (ref 0.7–4.0)
MCH: 27.5 pg (ref 26.0–34.0)
MCHC: 29.8 g/dL — ABNORMAL LOW (ref 30.0–36.0)
MCV: 92.3 fL (ref 80.0–100.0)
Monocytes Absolute: 1.1 10*3/uL — ABNORMAL HIGH (ref 0.1–1.0)
Monocytes Relative: 6 %
Neutro Abs: 16.2 10*3/uL — ABNORMAL HIGH (ref 1.7–7.7)
Neutrophils Relative %: 84 %
Platelets: 261 10*3/uL (ref 150–400)
RBC: 4 MIL/uL (ref 3.87–5.11)
RDW: 14.8 % (ref 11.5–15.5)
WBC: 19.1 10*3/uL — AB (ref 4.0–10.5)
nRBC: 0 % (ref 0.0–0.2)

## 2018-11-23 LAB — URINALYSIS, ROUTINE W REFLEX MICROSCOPIC
Bacteria, UA: NONE SEEN
Bilirubin Urine: NEGATIVE
Glucose, UA: NEGATIVE mg/dL
Ketones, ur: NEGATIVE mg/dL
Leukocytes,Ua: NEGATIVE
Nitrite: NEGATIVE
Protein, ur: NEGATIVE mg/dL
Specific Gravity, Urine: 1.011 (ref 1.005–1.030)
pH: 6 (ref 5.0–8.0)

## 2018-11-23 LAB — COMPREHENSIVE METABOLIC PANEL
ALK PHOS: 72 U/L (ref 38–126)
ALT: 12 U/L (ref 0–44)
AST: 17 U/L (ref 15–41)
Albumin: 4.6 g/dL (ref 3.5–5.0)
Anion gap: 9 (ref 5–15)
BILIRUBIN TOTAL: 0.5 mg/dL (ref 0.3–1.2)
BUN: 15 mg/dL (ref 8–23)
CALCIUM: 9.7 mg/dL (ref 8.9–10.3)
CO2: 29 mmol/L (ref 22–32)
Chloride: 98 mmol/L (ref 98–111)
Creatinine, Ser: 0.62 mg/dL (ref 0.44–1.00)
GFR calc Af Amer: >60 mL/min (ref >60)
GFR calc non Af Amer: >60 mL/min (ref >60)
Glucose, Bld: 127 mg/dL — ABNORMAL HIGH (ref 70–99)
Potassium: 3.5 mmol/L (ref 3.5–5.1)
Sodium: 136 mmol/L (ref 135–145)
Total Protein: 7.9 g/dL (ref 6.5–8.1)

## 2018-11-23 LAB — LACTIC ACID, PLASMA
Lactic Acid, Venous: 1 mmol/L (ref 0.5–1.9)
Lactic Acid, Venous: 0.7 mmol/L (ref 0.5–1.9)

## 2018-11-23 LAB — PROTIME-INR
INR: 1 (ref 0.8–1.2)
Prothrombin Time: 12.6 seconds (ref 11.4–15.2)

## 2018-11-23 MED ORDER — IOHEXOL 300 MG/ML  SOLN
100.0000 mL | Freq: Once | INTRAMUSCULAR | Status: AC | PRN
  Administered 2018-11-23: 100 mL via INTRAVENOUS

## 2018-11-23 MED ORDER — SODIUM CHLORIDE 0.9% FLUSH: 3.0000 mL | Freq: Once | INTRAVENOUS | Status: DC

## 2018-11-23 MED ORDER — IOHEXOL 300 MG/ML  SOLN: 30.0000 mL | Freq: Once | INTRAMUSCULAR | Status: DC | PRN

## 2018-11-23 MED ORDER — AMOXICILLIN-POT CLAVULANATE 875-125 MG PO TABS: 1.0000 | ORAL_TABLET | Freq: Two times a day (BID) | ORAL | 0 refills | Status: DC

## 2018-11-23 MED ORDER — ACETAMINOPHEN 325 MG PO TABS
650.0000 mg | ORAL_TABLET | Freq: Once | ORAL | Status: AC
  Administered 2018-11-23: 650 mg via ORAL
  Filled 2018-11-23: qty 2

## 2018-11-23 MED ORDER — AMOXICILLIN-POT CLAVULANATE 875-125 MG PO TABS
1.0000 | ORAL_TABLET | Freq: Once | ORAL | Status: AC
  Administered 2018-11-23: 1 via ORAL
  Filled 2018-11-23: qty 1

## 2018-11-23 MED ORDER — SODIUM CHLORIDE 0.9 % IV BOLUS
1000.0000 mL | Freq: Once | INTRAVENOUS | Status: AC
  Administered 2018-11-23: 1000 mL via INTRAVENOUS

## 2018-11-23 MED ORDER — LORAZEPAM 1 MG PO TABS
1.0000 mg | ORAL_TABLET | Freq: Once | ORAL | Status: AC
  Administered 2018-11-23: 1 mg via ORAL
  Filled 2018-11-23: qty 1

## 2018-11-23 NOTE — Discharge Instructions (Addendum)
Take Tylenol every 4 hours, until your fever resolves.  Make sure that you are getting plenty of rest and drinking a lot of fluid.  Start the antibiotic prescription, in the morning.  Return here, if needed, for problems.

## 2018-11-23 NOTE — ED Provider Notes (Signed)
[facility] EMERGENCY DEPARTMENT Provider Note   CSN: [id number] Arrival date & time: 11/23/18  1301    History   Chief Complaint Chief Complaint  Patient presents with  . Shortness of Breath    HPI Lindsey Combs is a 73 y.o. female.     HPI   She presents for evaluation of shortness of breath.  Also reports fever.  Onset of illness yesterday.  She has been using Tylenol regularly for persistent fever with chills.  She is coughing and producing some sputum.  Denies nausea, vomiting, focal weakness or paresthesia.  She complains of mild shortness of breath.  There are no other known modifying factors.  Past Medical History:  Diagnosis Date  . Acute respiratory failure with hypoxia ([place])   . Anxiety   . Cancer (HCC)    uvular per pt  . COPD (chronic obstructive pulmonary disease) ([place])   . DDD (degenerative disc disease), lumbar   . Degenerative joint disease of spine   . Diverticulosis   . GERD (gastroesophageal reflux disease)   . H. pylori infection    treated 02/2013.  [name] History of UTI   . Hypercholesterolemia   . Hypertension   . Hyponatremia   . Peripheral neuropathy   . Shortness of breath    with exertion  . Tobacco abuse     Patient Active Problem List   Diagnosis Date Noted  . Acute on chronic respiratory failure with hypoxia ([place]) 05/04/2018  . Severe protein-calorie malnutrition ([place]) 05/04/2018  . Closed right hip fracture ([place]) greater Trochanter 07/13/17 07/13/2017  . Vomiting and diarrhea 07/13/2017  . Fall at home, initial encounter 07/13/2017  . Hip fracture ([place]) 07/13/2017  . COPD with acute exacerbation ([place]) 02/16/2017  . Tobacco abuse 02/16/2017  . Malnutrition of moderate degree 09/22/2016  . Hypomagnesemia 09/21/2016  . Tobacco use 08/03/2016  . Hyperlipidemia 08/03/2016  . Hypertension 08/03/2016  . History of Helicobacter pylori infection 08/03/2016  . History of anemia 08/03/2016  . Diverticulosis 08/03/2016  . Arthritis  08/03/2016  . 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 Chronic obstructive pulmonary disease with acute exacerbation ([place])   . Acute respiratory failure with hypoxia ([place]) 04/03/2015  . COPD exacerbation ([place]) 04/03/2015  . Hyponatremia 04/03/2015  . Dehydration 04/03/2015  . Hyperglycemia 04/03/2015  . Anxiety   . COPD (chronic obstructive pulmonary disease) ([place])   . Peripheral neuropathy ([place])   . Tobacco dependence   . Back pain, chronic 04/19/2013  . Abnormal weight loss 02/19/2013  . Anemia, iron deficiency 02/19/2013  . GERD (gastroesophageal reflux disease) 02/19/2013    Past Surgical History:  Procedure Laterality Date  . ABDOMINAL EXPLORATION SURGERY     age 73, large ovarian cyst  . BIOPSY N/A 03/01/2013   VQQ:VZDG hiatal hernia; otherwise, normal examination/ Status post biopsies as described above. SB bx negative for Celiac. +h/pylori  . CATARACT EXTRACTION W/ INTRAOCULAR LENS  IMPLANT, BILATERAL    . COLONOSCOPY  10/16/09   Jenkins:3 polypsin the sigmoid colon/polyps in the descending colon and the rectum/scattered diverticulum. hyperplastic  . COLONOSCOPY WITH ESOPHAGOGASTRODUODENOSCOPY (EGD) N/A 03/01/2013   LOV:FIEPPIRJ colonic polyps - treated/removed as described above. Colonic diverticulosis. Hyperplastic. Next TCS 02/2018.  [name] DIRECT LARYNGOSCOPY N/A 06/08/2016   Procedure: DIRECT LARYNGOSCOPY AND BIOPSY;  Surgeon: Leta Baptist, MD;  Location: [facility] OR;  Service: ENT;  Laterality: N/A;  . PARTIAL HYSTERECTOMY     age 71  . UVULECTOMY       OB History  Gravida  5   Para  4   Term  4   Preterm      AB  1   Living        SAB  1   TAB      Ectopic      Multiple      Live Births               Home Medications    Prior to Admission medications   Medication Sig Start Date End Date Taking? Authorizing Provider  acetaminophen (TYLENOL) 325 MG tablet Take 650 mg by mouth at bedtime.    Yes [provider]  albuterol (PROVENTIL, VENTOLIN) (5  MG/ML) 0.5% NEBU Take 1.25 mg by nebulization continuous.   Yes [provider]  ALPRAZolam Duanne Moron) 0.5 MG tablet Take 1 tablet by mouth 2 (two) times daily as needed. 10/19/18  Yes [provider]  amLODipine (NORVASC) 5 MG tablet TAKE 1 TABLET BY MOUTH ONCE A DAY. 01/16/17  Yes Kefalas, Manon Hilding, PA-C  atorvastatin (LIPITOR) 10 MG tablet Take 10 mg by mouth daily.   Yes [provider]  fluticasone furoate-vilanterol (BREO ELLIPTA) 100-25 MCG/INH AEPB Inhale 1 puff into the lungs daily.   Yes [provider]  nicotine (NICODERM CQ - DOSED IN MG/24 HR) 7 mg/24hr patch Place 7 mg onto the skin daily.    Yes [provider]  ranitidine (ZANTAC) 150 MG tablet Take 1 tablet by mouth daily.   Yes [provider]  tiotropium (SPIRIVA) 18 MCG inhalation capsule Place 1 capsule into inhaler and inhale daily.   Yes [provider]  Vitamin D, Ergocalciferol, (DRISDOL) 1.25 MG (50000 UT) CAPS capsule Take 50,000 Units by mouth every 7 (seven) days.   Yes [provider]  amoxicillin-clavulanate (AUGMENTIN) 875-125 MG tablet Take 1 tablet by mouth 2 (two) times daily. One po bid x 7 days 11/23/18   Daleen Bo, MD  hydrochlorothiazide (MICROZIDE) 12.5 MG capsule Take 1 capsule by mouth daily.    [provider]  triamcinolone (NASACORT) 55 MCG/ACT AERO nasal inhaler Place 2 sprays into the nose daily. Use daily for five days Patient not taking: Reported on 11/23/2018 10/19/18   Carmin Muskrat, MD    Family History Family History  Problem Relation Age of Onset  . Leukemia Father   . Thyroid disease Father   . Stomach cancer Paternal Grandfather   . Colon cancer Neg Hx     Social History Social History   Tobacco Use  . Smoking status: Former Smoker    Packs/day: 1.50    Years: 50.00    Pack years: 75.00    Types: Cigarettes    Last attempt to quit: 07/16/2018    Years since quitting: 0.3  . Smokeless tobacco:  Never Used  Substance Use Topics  . Alcohol use: Yes    Comment: glass of wine occasionally  . Drug use: No     Allergies   Mucinex [guaifenesin er]; Prednisone; Chantix [varenicline]; and Codeine   Review of Systems Review of Systems  All other systems reviewed and are negative.    Physical Exam Updated Vital Signs BP (!) 159/62   Pulse (!) 102   Temp (!) 103.3 F (39.6 C) (Rectal)   Resp (!) 25   Ht 5\' 5"  (1.651 m)   Wt 48.5 kg   SpO2 97%   BMI 17.81 kg/m   Physical Exam Vitals signs and nursing note reviewed.  Constitutional:  Appearance: She is well-developed.  HENT:     Head: Normocephalic and atraumatic.     Right Ear: External ear normal.     Left Ear: External ear normal.     Nose: Nose normal.     Mouth/Throat:     Mouth: Mucous membranes are moist.  Eyes:     Conjunctiva/sclera: Conjunctivae normal.     Pupils: Pupils are equal, round, and reactive to light.  Neck:     Musculoskeletal: Normal range of motion and neck supple.     Trachea: Phonation normal.  Cardiovascular:     Rate and Rhythm: Normal rate and regular rhythm.     Heart sounds: Normal heart sounds.  Pulmonary:     Effort: Pulmonary effort is normal.     Breath sounds: Decreased breath sounds, wheezing and rhonchi present. No rales.  Chest:     Chest wall: No mass or deformity. There is no dullness to percussion.  Abdominal:     Palpations: Abdomen is soft.     Tenderness: There is no abdominal tenderness.  Musculoskeletal: Normal range of motion.  Skin:    General: Skin is warm and dry.  Neurological:     Mental Status: She is alert and oriented to person, place, and time.     Cranial Nerves: No cranial nerve deficit.     Sensory: No sensory deficit.     Motor: No abnormal muscle tone.     Coordination: Coordination normal.  Psychiatric:        Mood and Affect: Mood normal.        Behavior: Behavior normal.        Thought Content: Thought content normal.         Judgment: Judgment normal.      ED Treatments / Results  Labs (all labs ordered are listed, but only abnormal results are displayed) Labs Reviewed  COMPREHENSIVE METABOLIC PANEL - Abnormal; Notable for the following components:      Result Value   Glucose, Bld 127 (*)    All other components within normal limits  CBC WITH DIFFERENTIAL/PLATELET - Abnormal; Notable for the following components:   WBC 19.1 (*)    Hemoglobin 11.0 (*)    MCHC 29.8 (*)    Neutro Abs 16.2 (*)    Monocytes Absolute 1.1 (*)    Abs Immature Granulocytes 0.08 (*)    All other components within normal limits  URINALYSIS, ROUTINE W REFLEX MICROSCOPIC - Abnormal; Notable for the following components:   Hgb urine dipstick SMALL (*)    Non Squamous Epithelial 0-5 (*)    All other components within normal limits  CULTURE, BLOOD (ROUTINE X 2)  CULTURE, BLOOD (ROUTINE X 2)  LACTIC ACID, PLASMA  LACTIC ACID, PLASMA  PROTIME-INR  INFLUENZA PANEL BY PCR (TYPE A & B)    EKG EKG Interpretation  Date/Time:  Friday November 23 2018 13:22:29 EST Ventricular Rate:  115 PR Interval:  132 QRS Duration: 88 QT Interval:  314 QTC Calculation: 434 R Axis:   83 Text Interpretation:  Sinus tachycardia Right atrial enlargement Nonspecific ST abnormality Abnormal ECG Since last tracing rate faster Confirmed by Daleen Bo [phone]) on 11/23/2018 2:29:38 PM   Radiology Dg Chest 2 View  Result Date: 11/23/2018 CLINICAL DATA:  73 y/o  F; suspected sepsis. EXAM: CHEST - 2 VIEW COMPARISON:  10/19/2018 chest radiograph at 18:20 CT chest FINDINGS: Normal cardiac silhouette. Aortic atherosclerosis with calcification. Stable emphysema and indeterminate right upper lobe opacity better characterized  on prior CT of chest. No focal consolidation. No pleural effusion or pneumothorax. No acute osseous abnormality is evident. IMPRESSION: Stable emphysema and indeterminate right upper lobe opacity better characterized on prior CT of  chest. No focal consolidation. Electronically Signed   By: Kristine Garbe M.D.   On: 11/23/2018 14:29   Ct Abdomen Pelvis W Contrast  Result Date: 11/23/2018 CLINICAL DATA:  Abdominal infection. Fever and shortness of breath. EXAM: CT ABDOMEN AND PELVIS WITH CONTRAST TECHNIQUE: Multidetector CT imaging of the abdomen and pelvis was performed using the standard protocol following bolus administration of intravenous contrast. CONTRAST:  119mL OMNIPAQUE IOHEXOL 300 MG/ML  SOLN COMPARISON:  07/01/2016 FINDINGS: Lower chest: Centrilobular emphysema. Unchanged 14 x 5 mm density in the right lateral costophrenic sulcus, likely atelectasis. No pleural effusion. Hepatobiliary: Unchanged subtle nodularity of the liver contour. No focal liver abnormality is seen. No gallstones, gallbladder wall thickening, or biliary dilatation. Pancreas: Unremarkable. Spleen: Small calcified splenic granulomas. Adrenals/Urinary Tract: Unremarkable right adrenal gland. Unchanged left adrenal thickening. Nonobstructing renal calculi and vascular calcifications bilaterally. No hydronephrosis. Multiple subcentimeter low-density lesions again noted in both kidneys, too small to fully characterize. 11 mm interpolar right renal cyst, unchanged. Unremarkable bladder. Stomach/Bowel: The stomach is collapsed. Oral contrast is present in small and large bowel loops. No bowel dilatation is seen to indicate obstruction. Left-sided colonic diverticulosis is noted without evidence of diverticulitis. Vascular/Lymphatic: Extensive abdominal aortic atherosclerosis without aneurysm. No enlarged lymph nodes. Reproductive: Status post hysterectomy. No adnexal masses. Other: No intraperitoneal free fluid. Musculoskeletal: No acute osseous abnormality or suspicious osseous lesion. Moderate disc space narrowing at L5-S1. IMPRESSION: 1. No acute abnormality identified in the abdomen or pelvis. 2. Nonobstructing nephrolithiasis. 3. Additional chronic  incidental findings as above. 4. Aortic Atherosclerosis (ICD10-I70.0) and Emphysema (ICD10-J43.9). Electronically Signed   By: Logan Bores M.D.   On: 11/23/2018 20:10    Procedures Procedures (including critical care time)  Medications Ordered in ED Medications  sodium chloride flush (NS) 0.9 % injection 3 mL (3 mLs Intravenous Not Given 11/23/18 1445)  iohexol (OMNIPAQUE) 300 MG/ML solution 30 mL (has no administration in time range)  sodium chloride 0.9 % bolus 1,000 mL (0 mLs Intravenous Stopped 11/23/18 1617)  acetaminophen (TYLENOL) tablet 650 mg (650 mg Oral Given 11/23/18 1447)  iohexol (OMNIPAQUE) 300 MG/ML solution 100 mL (100 mLs Intravenous Contrast Given 11/23/18 1943)  LORazepam (ATIVAN) tablet 1 mg (1 mg Oral Given 11/23/18 2034)  acetaminophen (TYLENOL) tablet 650 mg (650 mg Oral Given 11/23/18 2034)  sodium chloride 0.9 % bolus 1,000 mL (1,000 mLs Intravenous New Bag/Given 11/23/18 2110)  amoxicillin-clavulanate (AUGMENTIN) 875-125 MG per tablet 1 tablet (1 tablet Oral Given 11/23/18 2110)     Initial Impression / Assessment and Plan / ED Course  I have reviewed the triage vital signs and the nursing notes.  Pertinent labs & imaging results that were available during my care of the patient were reviewed by me and considered in my medical decision making (see chart for details).  Clinical Course as of Nov 23 2157  Fri Nov 23, 2018  1446 No infiltrate or CHF, images reviewed by me  DG Chest 2 View [EW]  1602 Normal  Lactic acid, plasma [EW]  1603 Normal except white count high  CBC with Differential(!) [EW]  1603 Normal  Influenza panel by PCR (type A & B) [EW]  1603 Normal except glucose high  Comprehensive metabolic panel(!) [EW]  8416 Normal  Protime-INR [EW]  1603 Normal except hemoglobin  elevated  Urinalysis, Routine w reflex microscopic(!) [EW]  1603 No infiltrate or CHF, images reviewed by me.  Prior mass noted, unchanged.  DG Chest 2 View [EW]  2030 No acute  abnormality, images reviewed by me  CT Abdomen Pelvis W Contrast [EW]  2045 This time patient heart rate is 140 bpm.  She is having a chill and feels warm to touch.   [EW]    Clinical Course User Index [EW] Daleen Bo, MD        Patient Vitals for the past 24 hrs:  BP Temp Temp src Pulse Resp SpO2 Height Weight  11/23/18 2130 (!) 159/62 - - (!) 102 (!) 25 97 % - -  11/23/18 2111 (!) 181/67 - - (!) 121 14 95 % - -  11/23/18 2109 (!) 181/67 - - (!) 118 15 97 % - -  11/23/18 2103 - (!) 103.3 F (39.6 C) Rectal - - - - -  11/23/18 1930 (!) 170/58 - - 91 15 100 % - -  11/23/18 1900 (!) 170/62 - - 98 13 100 % - -  11/23/18 1830 (!) 181/70 - - 98 20 100 % - -  11/23/18 1800 (!) 162/61 - - 96 17 100 % - -  11/23/18 1700 135/73 - - 94 (!) 22 100 % - -  11/23/18 1629 - 99.5 F (37.5 C) Oral - - - - -  11/23/18 1600 (!) 120/53 - - 90 14 97 % - -  11/23/18 1530 (!) 153/59 - - 100 (!) 21 97 % - -  11/23/18 1500 (!) 196/62 - - (!) 110 20 98 % - -  11/23/18 1435 - (!) 102.2 F (39 C) Rectal - - - - -  11/23/18 1430 (!) 140/52 - - 92 13 100 % - -  11/23/18 1312 (!) 174/90 99.7 F (37.6 C) Oral (!) 123 (!) 26 90 % - -  11/23/18 1310 - - - - - - 5\' 5"  (1.651 m) 48.5 kg    4:58 PM Reevaluation with update and discussion. After initial assessment and treatment, an updated evaluation reveals patient clinical status is unchanged.  I discussed the findings with her.  She now states that she has been having some abdominal discomfort in her bilateral abdomen.  She is concerned that she may have an infection in her abdomen because no other source has been found for her infection.  We will proceed with CT imaging of the abdomen pelvis. Daleen Bo   9:50 PM-at this time she states she feels like her sinuses are draining, and she does not any longer have a headache after taking "the pill," which was given for sinusitis.  Heart rate currently 99.  Oxygen saturation nasal cannula supplementation is  normal.  Patient is agreeable to being discharged at this time.  Medical Decision Making: Nonspecific febrile illness, possibly sinusitis, reassuring evaluation.  No evidence for suspect infection, metabolic instability or impending vascular collapse.  Doubt intra-abdominal infection.  Doubt chest infection.  CRITICAL CARE-no Performed by: Daleen Bo   Nursing Notes Reviewed/ Care Coordinated Applicable Imaging Reviewed Interpretation of Laboratory Data incorporated into ED treatment  The patient appears reasonably screened and/or stabilized for discharge and I doubt any other medical condition or other [facility] requiring further screening, evaluation, or treatment in the ED at this time prior to discharge.  Plan: Home Medications-continue usual; Home Treatments-rest, fluids; return here if the recommended treatment, does not improve the symptoms; Recommended follow up-PCP, PRN, and in  1 week for follow-up.    Final Clinical Impressions(s) / ED Diagnoses   Final diagnoses:  Sinusitis, unspecified chronicity, unspecified location    ED Discharge Orders         Ordered    amoxicillin-clavulanate (AUGMENTIN) 875-125 MG tablet  2 times daily     11/23/18 2156           Daleen Bo, MD 11/23/18 2159

## 2018-11-23 NOTE — ED Triage Notes (Signed)
Patient reports SOB, fever that started yesterday. States she is always SOB but has become much worse since yesterday.

## 2018-11-23 NOTE — ED Notes (Signed)
Walked pt in hallway wear her pulsed 02 on 3 L, pt dropped to 88%,  Pt is sob and coughing.

## 2018-11-23 NOTE — ED Notes (Signed)
Pt ambulatory to the bathroom on room air, pt dropped to 74%. Pt back in bed, on 3L, back at 93% Explained to drink CT contrast bottles one at 5:30 2nd on at 6:30  They will scan at 7:30

## 2018-11-23 NOTE — ED Notes (Signed)
Patient ambulated to bathroom with assistance of one, when placed back in bed, pt's O2-85 %, despite being on 3 liters on portable oxygen.

## 2018-11-28 LAB — CULTURE, BLOOD (ROUTINE X 2)
Culture: NO GROWTH
Culture: NO GROWTH
Special Requests: ADEQUATE
Special Requests: ADEQUATE

## 2018-12-06 DIAGNOSIS — J441 Chronic obstructive pulmonary disease with (acute) exacerbation: Secondary | ICD-10-CM | POA: Diagnosis not present

## 2018-12-06 DIAGNOSIS — J9801 Acute bronchospasm: Secondary | ICD-10-CM | POA: Diagnosis not present

## 2018-12-08 DIAGNOSIS — J9801 Acute bronchospasm: Secondary | ICD-10-CM | POA: Diagnosis not present

## 2018-12-08 DIAGNOSIS — J441 Chronic obstructive pulmonary disease with (acute) exacerbation: Secondary | ICD-10-CM | POA: Diagnosis not present

## 2018-12-18 ENCOUNTER — Ambulatory Visit: Payer: Medicare Other | Admitting: Gastroenterology

## 2019-01-06 DIAGNOSIS — J441 Chronic obstructive pulmonary disease with (acute) exacerbation: Secondary | ICD-10-CM | POA: Diagnosis not present

## 2019-01-06 DIAGNOSIS — J9801 Acute bronchospasm: Secondary | ICD-10-CM | POA: Diagnosis not present

## 2019-01-08 DIAGNOSIS — J441 Chronic obstructive pulmonary disease with (acute) exacerbation: Secondary | ICD-10-CM | POA: Diagnosis not present

## 2019-01-08 DIAGNOSIS — J9801 Acute bronchospasm: Secondary | ICD-10-CM | POA: Diagnosis not present

## 2019-01-22 DIAGNOSIS — I10 Essential (primary) hypertension: Secondary | ICD-10-CM | POA: Diagnosis not present

## 2019-01-22 DIAGNOSIS — J9611 Chronic respiratory failure with hypoxia: Secondary | ICD-10-CM | POA: Diagnosis not present

## 2019-01-22 DIAGNOSIS — E785 Hyperlipidemia, unspecified: Secondary | ICD-10-CM | POA: Diagnosis not present

## 2019-01-22 DIAGNOSIS — J449 Chronic obstructive pulmonary disease, unspecified: Secondary | ICD-10-CM | POA: Diagnosis not present

## 2019-02-04 ENCOUNTER — Encounter (HOSPITAL_COMMUNITY): Payer: Self-pay

## 2019-02-04 ENCOUNTER — Encounter (HOSPITAL_COMMUNITY): Payer: Medicare Other

## 2019-02-05 DIAGNOSIS — J441 Chronic obstructive pulmonary disease with (acute) exacerbation: Secondary | ICD-10-CM | POA: Diagnosis not present

## 2019-02-05 DIAGNOSIS — J9801 Acute bronchospasm: Secondary | ICD-10-CM | POA: Diagnosis not present

## 2019-02-06 ENCOUNTER — Other Ambulatory Visit (HOSPITAL_COMMUNITY): Payer: Self-pay | Admitting: *Deleted

## 2019-02-06 DIAGNOSIS — R911 Solitary pulmonary nodule: Secondary | ICD-10-CM | POA: Diagnosis not present

## 2019-02-06 DIAGNOSIS — C052 Malignant neoplasm of uvula: Secondary | ICD-10-CM | POA: Diagnosis not present

## 2019-02-06 DIAGNOSIS — D508 Other iron deficiency anemias: Secondary | ICD-10-CM

## 2019-02-06 DIAGNOSIS — Z862 Personal history of diseases of the blood and blood-forming organs and certain disorders involving the immune mechanism: Secondary | ICD-10-CM

## 2019-02-07 ENCOUNTER — Other Ambulatory Visit: Payer: Self-pay

## 2019-02-07 ENCOUNTER — Inpatient Hospital Stay (HOSPITAL_COMMUNITY): Payer: Medicare Other | Attending: Hematology

## 2019-02-07 ENCOUNTER — Ambulatory Visit (HOSPITAL_COMMUNITY): Admission: RE | Admit: 2019-02-07 | Payer: Medicare Other | Source: Ambulatory Visit

## 2019-02-07 DIAGNOSIS — K219 Gastro-esophageal reflux disease without esophagitis: Secondary | ICD-10-CM | POA: Insufficient documentation

## 2019-02-07 DIAGNOSIS — R911 Solitary pulmonary nodule: Secondary | ICD-10-CM | POA: Insufficient documentation

## 2019-02-07 DIAGNOSIS — D509 Iron deficiency anemia, unspecified: Secondary | ICD-10-CM | POA: Insufficient documentation

## 2019-02-07 DIAGNOSIS — G629 Polyneuropathy, unspecified: Secondary | ICD-10-CM | POA: Insufficient documentation

## 2019-02-07 DIAGNOSIS — J449 Chronic obstructive pulmonary disease, unspecified: Secondary | ICD-10-CM | POA: Diagnosis not present

## 2019-02-07 DIAGNOSIS — Z79899 Other long term (current) drug therapy: Secondary | ICD-10-CM | POA: Insufficient documentation

## 2019-02-07 DIAGNOSIS — Z85819 Personal history of malignant neoplasm of unspecified site of lip, oral cavity, and pharynx: Secondary | ICD-10-CM | POA: Insufficient documentation

## 2019-02-07 DIAGNOSIS — E7800 Pure hypercholesterolemia, unspecified: Secondary | ICD-10-CM | POA: Diagnosis not present

## 2019-02-07 DIAGNOSIS — Z800 Family history of malignant neoplasm of digestive organs: Secondary | ICD-10-CM | POA: Insufficient documentation

## 2019-02-07 DIAGNOSIS — Z87440 Personal history of urinary (tract) infections: Secondary | ICD-10-CM | POA: Insufficient documentation

## 2019-02-07 DIAGNOSIS — R0602 Shortness of breath: Secondary | ICD-10-CM | POA: Diagnosis not present

## 2019-02-07 DIAGNOSIS — I10 Essential (primary) hypertension: Secondary | ICD-10-CM | POA: Insufficient documentation

## 2019-02-07 DIAGNOSIS — F419 Anxiety disorder, unspecified: Secondary | ICD-10-CM | POA: Insufficient documentation

## 2019-02-07 DIAGNOSIS — Z87891 Personal history of nicotine dependence: Secondary | ICD-10-CM | POA: Diagnosis not present

## 2019-02-07 DIAGNOSIS — J9801 Acute bronchospasm: Secondary | ICD-10-CM | POA: Diagnosis not present

## 2019-02-07 DIAGNOSIS — Z806 Family history of leukemia: Secondary | ICD-10-CM | POA: Diagnosis not present

## 2019-02-07 DIAGNOSIS — J441 Chronic obstructive pulmonary disease with (acute) exacerbation: Secondary | ICD-10-CM | POA: Diagnosis not present

## 2019-02-07 DIAGNOSIS — Z8719 Personal history of other diseases of the digestive system: Secondary | ICD-10-CM | POA: Diagnosis not present

## 2019-02-07 DIAGNOSIS — C052 Malignant neoplasm of uvula: Secondary | ICD-10-CM

## 2019-02-07 DIAGNOSIS — Z862 Personal history of diseases of the blood and blood-forming organs and certain disorders involving the immune mechanism: Secondary | ICD-10-CM

## 2019-02-07 DIAGNOSIS — D508 Other iron deficiency anemias: Secondary | ICD-10-CM

## 2019-02-07 DIAGNOSIS — M5136 Other intervertebral disc degeneration, lumbar region: Secondary | ICD-10-CM | POA: Diagnosis not present

## 2019-02-07 DIAGNOSIS — E871 Hypo-osmolality and hyponatremia: Secondary | ICD-10-CM | POA: Insufficient documentation

## 2019-02-07 LAB — CBC WITH DIFFERENTIAL/PLATELET
Abs Immature Granulocytes: 0.03 10*3/uL (ref 0.00–0.07)
Basophils Absolute: 0.1 10*3/uL (ref 0.0–0.1)
Basophils Relative: 1 %
Eosinophils Absolute: 0.1 10*3/uL (ref 0.0–0.5)
Eosinophils Relative: 1 %
HCT: 31.6 % — ABNORMAL LOW (ref 36.0–46.0)
Hemoglobin: 9.7 g/dL — ABNORMAL LOW (ref 12.0–15.0)
Immature Granulocytes: 0 %
Lymphocytes Relative: 19 %
Lymphs Abs: 1.5 10*3/uL (ref 0.7–4.0)
MCH: 29 pg (ref 26.0–34.0)
MCHC: 30.7 g/dL (ref 30.0–36.0)
MCV: 94.6 fL (ref 80.0–100.0)
Monocytes Absolute: 0.6 10*3/uL (ref 0.1–1.0)
Monocytes Relative: 7 %
Neutro Abs: 5.8 10*3/uL (ref 1.7–7.7)
Neutrophils Relative %: 72 %
Platelets: 254 10*3/uL (ref 150–400)
RBC: 3.34 MIL/uL — ABNORMAL LOW (ref 3.87–5.11)
RDW: 13.9 % (ref 11.5–15.5)
WBC: 8.1 10*3/uL (ref 4.0–10.5)
nRBC: 0 % (ref 0.0–0.2)

## 2019-02-07 LAB — COMPREHENSIVE METABOLIC PANEL
ALT: 14 U/L (ref 0–44)
AST: 16 U/L (ref 15–41)
Albumin: 4.3 g/dL (ref 3.5–5.0)
Alkaline Phosphatase: 53 U/L (ref 38–126)
Anion gap: 10 (ref 5–15)
BUN: 13 mg/dL (ref 8–23)
CO2: 32 mmol/L (ref 22–32)
Calcium: 9.9 mg/dL (ref 8.9–10.3)
Chloride: 96 mmol/L — ABNORMAL LOW (ref 98–111)
Creatinine, Ser: 0.55 mg/dL (ref 0.44–1.00)
GFR calc Af Amer: >60 mL/min (ref >60)
GFR calc non Af Amer: >60 mL/min (ref >60)
Glucose, Bld: 117 mg/dL — ABNORMAL HIGH (ref 70–99)
Potassium: 3.9 mmol/L (ref 3.5–5.1)
Sodium: 138 mmol/L (ref 135–145)
Total Bilirubin: 0.5 mg/dL (ref 0.3–1.2)
Total Protein: 7.1 g/dL (ref 6.5–8.1)

## 2019-02-07 LAB — LACTATE DEHYDROGENASE: LDH: 152 U/L (ref 98–192)

## 2019-02-07 LAB — FERRITIN: Ferritin: 203 ng/mL (ref 11–307)

## 2019-02-08 LAB — PROTEIN ELECTROPHORESIS, SERUM
A/G Ratio: 1.5 (ref 0.7–1.7)
Albumin ELP: 4.1 g/dL (ref 2.9–4.4)
Alpha-1-Globulin: 0.3 g/dL (ref 0.0–0.4)
Alpha-2-Globulin: 0.7 g/dL (ref 0.4–1.0)
Beta Globulin: 1 g/dL (ref 0.7–1.3)
Gamma Globulin: 0.8 g/dL (ref 0.4–1.8)
Globulin, Total: 2.7 g/dL (ref 2.2–3.9)
Total Protein ELP: 6.8 g/dL (ref 6.0–8.5)

## 2019-02-13 ENCOUNTER — Other Ambulatory Visit: Payer: Self-pay

## 2019-02-13 ENCOUNTER — Ambulatory Visit (HOSPITAL_COMMUNITY): Payer: Medicare Other

## 2019-02-13 ENCOUNTER — Ambulatory Visit (HOSPITAL_COMMUNITY)
Admission: RE | Admit: 2019-02-13 | Discharge: 2019-02-13 | Disposition: A | Discharge status: Home / Self Care | Payer: Medicare Other | Source: Ambulatory Visit | Attending: Internal Medicine | Admitting: Internal Medicine

## 2019-02-13 DIAGNOSIS — R911 Solitary pulmonary nodule: Secondary | ICD-10-CM | POA: Diagnosis not present

## 2019-02-13 DIAGNOSIS — J9809 Other diseases of bronchus, not elsewhere classified: Secondary | ICD-10-CM | POA: Diagnosis not present

## 2019-02-13 MED ORDER — IOHEXOL 300 MG/ML  SOLN
75.0000 mL | Freq: Once | INTRAMUSCULAR | Status: AC | PRN
  Administered 2019-02-13: 15:00:00 75 mL via INTRAVENOUS

## 2019-02-14 ENCOUNTER — Encounter (HOSPITAL_COMMUNITY): Payer: Self-pay | Admitting: Hematology

## 2019-02-14 ENCOUNTER — Inpatient Hospital Stay (HOSPITAL_BASED_OUTPATIENT_CLINIC_OR_DEPARTMENT_OTHER): Payer: Medicare Other | Admitting: Hematology

## 2019-02-14 VITALS — BP 179/64 | HR 90 | Temp 98.4°F | Resp 16 | Wt 115.0 lb

## 2019-02-14 DIAGNOSIS — E871 Hypo-osmolality and hyponatremia: Secondary | ICD-10-CM | POA: Diagnosis not present

## 2019-02-14 DIAGNOSIS — R0602 Shortness of breath: Secondary | ICD-10-CM | POA: Diagnosis not present

## 2019-02-14 DIAGNOSIS — G629 Polyneuropathy, unspecified: Secondary | ICD-10-CM | POA: Diagnosis not present

## 2019-02-14 DIAGNOSIS — K219 Gastro-esophageal reflux disease without esophagitis: Secondary | ICD-10-CM | POA: Diagnosis not present

## 2019-02-14 DIAGNOSIS — I10 Essential (primary) hypertension: Secondary | ICD-10-CM | POA: Diagnosis not present

## 2019-02-14 DIAGNOSIS — Z800 Family history of malignant neoplasm of digestive organs: Secondary | ICD-10-CM | POA: Diagnosis not present

## 2019-02-14 DIAGNOSIS — J449 Chronic obstructive pulmonary disease, unspecified: Secondary | ICD-10-CM | POA: Diagnosis not present

## 2019-02-14 DIAGNOSIS — R911 Solitary pulmonary nodule: Secondary | ICD-10-CM

## 2019-02-14 DIAGNOSIS — D509 Iron deficiency anemia, unspecified: Secondary | ICD-10-CM

## 2019-02-14 DIAGNOSIS — C052 Malignant neoplasm of uvula: Secondary | ICD-10-CM

## 2019-02-14 DIAGNOSIS — Z806 Family history of leukemia: Secondary | ICD-10-CM | POA: Diagnosis not present

## 2019-02-14 DIAGNOSIS — Z8719 Personal history of other diseases of the digestive system: Secondary | ICD-10-CM | POA: Diagnosis not present

## 2019-02-14 DIAGNOSIS — D508 Other iron deficiency anemias: Secondary | ICD-10-CM

## 2019-02-14 DIAGNOSIS — F419 Anxiety disorder, unspecified: Secondary | ICD-10-CM

## 2019-02-14 DIAGNOSIS — Z87891 Personal history of nicotine dependence: Secondary | ICD-10-CM

## 2019-02-14 DIAGNOSIS — E7800 Pure hypercholesterolemia, unspecified: Secondary | ICD-10-CM | POA: Diagnosis not present

## 2019-02-14 DIAGNOSIS — Z79899 Other long term (current) drug therapy: Secondary | ICD-10-CM | POA: Diagnosis not present

## 2019-02-14 DIAGNOSIS — Z87440 Personal history of urinary (tract) infections: Secondary | ICD-10-CM | POA: Diagnosis not present

## 2019-02-14 DIAGNOSIS — M5136 Other intervertebral disc degeneration, lumbar region: Secondary | ICD-10-CM

## 2019-02-14 DIAGNOSIS — Z85819 Personal history of malignant neoplasm of unspecified site of lip, oral cavity, and pharynx: Secondary | ICD-10-CM | POA: Diagnosis not present

## 2019-02-14 NOTE — Assessment & Plan Note (Signed)
1.  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 Nicolette Bang at [facility].  Pathology shows margins negative, P 16+. - Physical exam today did not reveal any masses in the soft palate.  No oropharyngeal masses.  No palpable neck adenopathy. -She does not have any problems swallowing.  2.  Right upper lobe pulmonary nodule: - This was found on previous scans and was also evaluated by Dr. Roxan Hockey.  She quit smoking in October 2019. - She denies any new cough or hemoptysis.  No chest wall pain reported. - I reviewed the results of the CT chest with contrast dated 02/13/2019 which showed thick-walled cavitary nodule in the right upper lobe appears stable.  New area of airspace consolidation near the apex of the right upper lobe concerning for inflammation/infection.  There is a new 7 x 10 mm nodule in the right upper lobe which warrants close follow-up. -Given these findings I recommended follow-up CT scan of the chest with contrast in 6 months.  3.  Iron deficiency anemia: - Last Feraheme on 10/25/2018 and 11/01/2018. -Blood work on 02/07/2019 shows hemoglobin of 9.7 with a ferritin of 203.  Creatinine was normal.

## 2019-02-14 NOTE — Patient Instructions (Addendum)
[facility] at [facility] Discharge Instructions  You were seen today by Dr. Katragadda. He went over your recent lab results. He will see you back in 6 months for labs and follow up.   Thank you for choosing [facility] at [facility] to provide your oncology and hematology care.  To afford each patient quality time with our provider, please arrive at least 15 minutes before your scheduled appointment time.   If you have a lab appointment with the Cancer Center please come in thru the  Main Entrance and check in at the main information desk  You need to re-schedule your appointment should you arrive 10 or more minutes late.  We strive to give you quality time with our providers, and arriving late affects you and other patients whose appointments are after yours.  Also, if you no show three or more times for appointments you may be dismissed from the clinic at the providers discretion.     Again, thank you for choosing [facility].  Our hope is that these requests will decrease the amount of time that you wait before being seen by our physicians.       _____________________________________________________________  Should you have questions after your visit to [facility], please contact our office at [phone] between the hours of 8:00 a.m. and 4:30 p.m.  Voicemails left after 4:00 p.m. will not be returned until the following business day.  For prescription refill requests, have your pharmacy contact our office and allow 72 hours.    Cancer Center Support Programs:   > Cancer Support Group  2nd Tuesday of the month 1pm-2pm, Journey Room    

## 2019-02-14 NOTE — Progress Notes (Signed)
Please schedule follow-up with Dr. Raliegh Ip or Lala Lund to review scan and update ordering provider

## 2019-02-14 NOTE — Progress Notes (Signed)
[name], [place] [address]   CLINIC:  Medical Oncology/Hematology  PCP:  [name], [address] [address] [phone]   REASON FOR VISIT:  Follow-up for iron deficiency anemia, Solitary pulmonary nodule and uvular cancer.    INTERVAL HISTORY:  [name] 73 y.o. female returns for routine follow-up. She is here today alone. She states that she has had a cold recently. She is on oxygen via . Denies any nausea, vomiting, or diarrhea. Denies any new pains. Had not noticed any recent bleeding such as epistaxis, hematuria or hematochezia. Denies recent chest pain on exertion, shortness of breath on minimal exertion, pre-syncopal episodes, or palpitations. Denies any numbness or tingling in hands or feet. Denies any recent fevers, infections, or recent hospitalizations. Patient reports appetite at 50% and energy level at 25%.    REVIEW OF SYSTEMS:  Review of Systems  Respiratory: Positive for cough and shortness of breath.      PAST MEDICAL/SURGICAL HISTORY:  Past Medical History:  Diagnosis Date  . Acute respiratory failure with hypoxia ([place])   . Anxiety   . Cancer (HCC)    uvular per pt  . COPD (chronic obstructive pulmonary disease) ([place])   . DDD (degenerative disc disease), lumbar   . Degenerative joint disease of spine   . Diverticulosis   . GERD (gastroesophageal reflux disease)   . H. pylori infection    treated 02/2013.  [name] History of UTI   . Hypercholesterolemia   . Hypertension   . Hyponatremia   . Peripheral neuropathy   . Shortness of breath    with exertion  . Tobacco abuse    Past Surgical History:  Procedure Laterality Date  . ABDOMINAL EXPLORATION SURGERY     age 14, large ovarian cyst  . BIOPSY N/A 03/01/2013   KGY:JEHU hiatal hernia; otherwise, normal examination/ Status post biopsies as described above. SB bx negative for Celiac. +h/pylori  . CATARACT EXTRACTION W/  INTRAOCULAR LENS  IMPLANT, BILATERAL    . COLONOSCOPY  10/16/09   Jenkins:3 polypsin the sigmoid colon/polyps in the descending colon and the rectum/scattered diverticulum. hyperplastic  . COLONOSCOPY WITH ESOPHAGOGASTRODUODENOSCOPY (EGD) N/A 03/01/2013   DJS:[id number] colonic polyps - treated/removed as described above. Colonic diverticulosis. Hyperplastic. Next TCS 02/2018.  [name] DIRECT LARYNGOSCOPY N/A 06/08/2016   Procedure: DIRECT LARYNGOSCOPY AND BIOPSY;  Surgeon: Leta Baptist, MD;  Location: [facility] OR;  Service: ENT;  Laterality: N/A;  . PARTIAL HYSTERECTOMY     age 59  . UVULECTOMY       SOCIAL HISTORY:  Social History   Socioeconomic History  . Marital status: Divorced    Spouse name: Not on file  . Number of children: 4  . Years of education: Not on file  . Highest education level: Not on file  Occupational History  . Not on file  Social Needs  . Financial resource strain: Not on file  . Food insecurity:    Worry: Not on file    Inability: Not on file  . Transportation needs:    Medical: Not on file    Non-medical: Not on file  Tobacco Use  . Smoking status: Former Smoker    Packs/day: 1.50    Years: 50.00    Pack years: 75.00    Types: Cigarettes    Last attempt to quit: 07/16/2018    Years since quitting: 0.5  . Smokeless tobacco: Never Used  Substance and Sexual Activity  .  Alcohol use: Yes    Comment: glass of wine occasionally  . Drug use: No  . Sexual activity: Not on file  Lifestyle  . Physical activity:    Days per week: Not on file    Minutes per session: Not on file  . Stress: Not on file  Relationships  . Social connections:    Talks on phone: Not on file    Gets together: Not on file    Attends religious service: Not on file    Active member of club or organization: Not on file    Attends meetings of clubs or organizations: Not on file    Relationship status: Not on file  . Intimate partner violence:    Fear of current or ex partner: Not on file     Emotionally abused: Not on file    Physically abused: Not on file    Forced sexual activity: Not on file  Other Topics Concern  . Not on file  Social History Narrative  . Not on file    FAMILY HISTORY:  Family History  Problem Relation Age of Onset  . Leukemia Father   . Thyroid disease Father   . Stomach cancer Paternal Grandfather   . Colon cancer Neg Hx     CURRENT MEDICATIONS:  Outpatient Encounter Medications as of 02/14/2019  Medication Sig  . acetaminophen (TYLENOL) 325 MG tablet Take 650 mg by mouth at bedtime.   [name] albuterol (PROVENTIL, VENTOLIN) (5 MG/ML) 0.5% NEBU Take 1.25 mg by nebulization continuous.  . ALPRAZolam (XANAX) 0.5 MG tablet Take 1 tablet by mouth 2 (two) times daily as needed.  [name] atorvastatin (LIPITOR) 10 MG tablet Take 10 mg by mouth daily.  . fluticasone furoate-vilanterol (BREO ELLIPTA) 100-25 MCG/INH AEPB Inhale 1 puff into the lungs daily.  [name] triamcinolone (NASACORT) 55 MCG/ACT AERO nasal inhaler Place 2 sprays into the nose daily. Use daily for five days  . Vitamin D, Ergocalciferol, (DRISDOL) 1.25 MG (50000 UT) CAPS capsule Take 50,000 Units by mouth every 7 (seven) days.  [name] amLODipine (NORVASC) 5 MG tablet TAKE 1 TABLET BY MOUTH ONCE A DAY.  . hydrochlorothiazide (MICROZIDE) 12.5 MG capsule Take 1 capsule by mouth daily.  . [DISCONTINUED] amoxicillin-clavulanate (AUGMENTIN) 875-125 MG tablet Take 1 tablet by mouth 2 (two) times daily. One po bid x 7 days  . [DISCONTINUED] nicotine (NICODERM CQ - DOSED IN MG/24 HR) 7 mg/24hr patch Place 7 mg onto the skin daily.   . [DISCONTINUED] ranitidine (ZANTAC) 150 MG tablet Take 1 tablet by mouth daily.  . [DISCONTINUED] tiotropium (SPIRIVA) 18 MCG inhalation capsule Place 1 capsule into inhaler and inhale daily.   No facility-administered encounter medications on file as of 02/14/2019.     ALLERGIES:  Allergies  Allergen Reactions  . Mucinex [Guaifenesin Er]   . Prednisone Nausea Only  . Chantix  [Varenicline] Other (See Comments)    Mental status changes  . Codeine Itching     PHYSICAL EXAM:  ECOG Performance status: 1  Vitals:   02/14/19 1000  BP: (!) 179/64  Pulse: 90  Resp: 16  Temp: 98.4 F (36.9 C)  SpO2: 100%   Filed Weights   02/14/19 1000  Weight: 115 lb (52.2 kg)    Physical Exam Vitals signs reviewed.  Constitutional:      Appearance: Normal appearance.  HENT:     Mouth/Throat:     Mouth: Mucous membranes are moist.     Pharynx: Oropharynx is clear.  Cardiovascular:  Rate and Rhythm: Normal rate and regular rhythm.     Heart sounds: Normal heart sounds.  Pulmonary:     Breath sounds: Normal breath sounds.  Abdominal:     General: There is no distension.     Palpations: Abdomen is soft. There is no mass.  Musculoskeletal:        General: No swelling.  Lymphadenopathy:     Cervical: No cervical adenopathy.  Skin:    General: Skin is warm.  Neurological:     General: No focal deficit present.     Mental Status: She is alert and oriented to person, place, and time.  Psychiatric:        Mood and Affect: Mood normal.        Behavior: Behavior normal.      LABORATORY DATA:  I have reviewed the labs as listed.  CBC    Component Value Date/Time   WBC 8.1 02/07/2019 0854   RBC 3.34 (L) 02/07/2019 0854   HGB 9.7 (L) 02/07/2019 0854   HGB 10.9 01/21/2013   HCT 31.6 (L) 02/07/2019 0854   HCT 33 01/21/2013   PLT 254 02/07/2019 0854   PLT 305 01/14/2013   MCV 94.6 02/07/2019 0854   MCV 90.2 01/21/2013   MCH 29.0 02/07/2019 0854   MCHC 30.7 02/07/2019 0854   RDW 13.9 02/07/2019 0854   LYMPHSABS 1.5 02/07/2019 0854   MONOABS 0.6 02/07/2019 0854   EOSABS 0.1 02/07/2019 0854   BASOSABS 0.1 02/07/2019 0854   CMP Latest Ref Rng & Units 02/07/2019 11/23/2018 10/17/2018  Glucose 70 - 99 mg/dL 117(H) 127(H) 88  BUN 8 - 23 mg/dL 13 15 17   Creatinine 0.44 - 1.00 mg/dL 0.55 0.62 0.52  Sodium 135 - 145 mmol/L 138 136 137  Potassium 3.5 - 5.1  mmol/L 3.9 3.5 4.5  Chloride 98 - 111 mmol/L 96(L) 98 98  CO2 22 - 32 mmol/L 32 29 32  Calcium 8.9 - 10.3 mg/dL 9.9 9.7 10.0  Total Protein 6.5 - 8.1 g/dL 7.1 7.9 6.7  Total Bilirubin 0.3 - 1.2 mg/dL 0.5 0.5 0.4  Alkaline Phos 38 - 126 U/L 53 72 52  AST 15 - 41 U/L 16 17 17   ALT 0 - 44 U/L 14 12 12        DIAGNOSTIC IMAGING:  I have independently reviewed the scans and discussed with the patient.   I have reviewed Venita Lick LPN's note and agree with the documentation.  I personally performed a face-to-face visit, made revisions and my assessment and plan is as follows.    ASSESSMENT & PLAN:   Carcinoma of uvula ([place]) 1.  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 Nicolette Bang at [facility].  Pathology shows margins negative, P 16+. - Physical exam today did not reveal any masses in the soft palate.  No oropharyngeal masses.  No palpable neck adenopathy. -She does not have any problems swallowing.  2.  Right upper lobe pulmonary nodule: - This was found on previous scans and was also evaluated by Dr. Roxan Hockey.  She quit smoking in October 2019. - She denies any new cough or hemoptysis.  No chest wall pain reported. - I reviewed the results of the CT chest with contrast dated 02/13/2019 which showed thick-walled cavitary nodule in the right upper lobe appears stable.  New area of airspace consolidation near the apex of the right upper lobe concerning for inflammation/infection.  There is a new 7  x 10 mm nodule in the right upper lobe which warrants close follow-up. -Given these findings I recommended follow-up CT scan of the chest with contrast in 6 months.  3.  Iron deficiency anemia: - Last Feraheme on 10/25/2018 and 11/01/2018. -Blood work on 02/07/2019 shows hemoglobin of 9.7 with a ferritin of 203.  Creatinine was normal.  Total time spent is 25 minutes with more than 50% of the time spent face-to-face discussing CT scan results,  implications, follow-up and coordination of care.    Orders placed this encounter:  Orders Placed This Encounter  Procedures  . CT Chest W Contrast  . CBC with Differential/Platelet  . Comprehensive metabolic panel  . Iron and TIBC  . Ferritin  . Vitamin B12  . Folate      Derek Jack, MD [place] [phone]

## 2019-02-21 DIAGNOSIS — J449 Chronic obstructive pulmonary disease, unspecified: Secondary | ICD-10-CM | POA: Diagnosis not present

## 2019-02-25 ENCOUNTER — Encounter: Payer: Self-pay | Admitting: Gastroenterology

## 2019-02-25 ENCOUNTER — Ambulatory Visit: Payer: Medicare Other | Admitting: Gastroenterology

## 2019-02-25 ENCOUNTER — Telehealth: Payer: Self-pay | Admitting: Gastroenterology

## 2019-02-25 NOTE — Telephone Encounter (Signed)
PATIENT WAS A NO SHOW AND LETTER SENT  °

## 2019-03-04 DIAGNOSIS — J309 Allergic rhinitis, unspecified: Secondary | ICD-10-CM | POA: Diagnosis not present

## 2019-03-04 DIAGNOSIS — J449 Chronic obstructive pulmonary disease, unspecified: Secondary | ICD-10-CM | POA: Diagnosis not present

## 2019-03-04 DIAGNOSIS — R05 Cough: Secondary | ICD-10-CM | POA: Diagnosis not present

## 2019-03-08 DIAGNOSIS — J9801 Acute bronchospasm: Secondary | ICD-10-CM | POA: Diagnosis not present

## 2019-03-08 DIAGNOSIS — J441 Chronic obstructive pulmonary disease with (acute) exacerbation: Secondary | ICD-10-CM | POA: Diagnosis not present

## 2019-03-10 DIAGNOSIS — J441 Chronic obstructive pulmonary disease with (acute) exacerbation: Secondary | ICD-10-CM | POA: Diagnosis not present

## 2019-03-10 DIAGNOSIS — J9801 Acute bronchospasm: Secondary | ICD-10-CM | POA: Diagnosis not present

## 2019-03-13 DIAGNOSIS — J449 Chronic obstructive pulmonary disease, unspecified: Secondary | ICD-10-CM | POA: Diagnosis not present

## 2019-03-13 DIAGNOSIS — Z9981 Dependence on supplemental oxygen: Secondary | ICD-10-CM | POA: Diagnosis not present

## 2019-03-13 DIAGNOSIS — S81802A Unspecified open wound, left lower leg, initial encounter: Secondary | ICD-10-CM | POA: Diagnosis not present

## 2019-03-19 DIAGNOSIS — J449 Chronic obstructive pulmonary disease, unspecified: Secondary | ICD-10-CM | POA: Diagnosis not present

## 2019-03-19 DIAGNOSIS — S81802D Unspecified open wound, left lower leg, subsequent encounter: Secondary | ICD-10-CM | POA: Diagnosis not present

## 2019-03-19 DIAGNOSIS — Z79899 Other long term (current) drug therapy: Secondary | ICD-10-CM | POA: Diagnosis not present

## 2019-03-19 DIAGNOSIS — I10 Essential (primary) hypertension: Secondary | ICD-10-CM | POA: Diagnosis not present

## 2019-03-19 DIAGNOSIS — E785 Hyperlipidemia, unspecified: Secondary | ICD-10-CM | POA: Diagnosis not present

## 2019-03-19 DIAGNOSIS — E559 Vitamin D deficiency, unspecified: Secondary | ICD-10-CM | POA: Diagnosis not present

## 2019-04-07 DIAGNOSIS — J9801 Acute bronchospasm: Secondary | ICD-10-CM | POA: Diagnosis not present

## 2019-04-07 DIAGNOSIS — J441 Chronic obstructive pulmonary disease with (acute) exacerbation: Secondary | ICD-10-CM | POA: Diagnosis not present

## 2019-04-09 DIAGNOSIS — J441 Chronic obstructive pulmonary disease with (acute) exacerbation: Secondary | ICD-10-CM | POA: Diagnosis not present

## 2019-04-09 DIAGNOSIS — J9801 Acute bronchospasm: Secondary | ICD-10-CM | POA: Diagnosis not present

## 2019-04-12 DIAGNOSIS — H353131 Nonexudative age-related macular degeneration, bilateral, early dry stage: Secondary | ICD-10-CM | POA: Diagnosis not present

## 2019-04-15 ENCOUNTER — Encounter (HOSPITAL_COMMUNITY)
Admission: RE | Admit: 2019-04-15 | Discharge: 2019-04-15 | Disposition: A | Discharge status: Home / Self Care | Payer: Medicare Other | Source: Ambulatory Visit | Attending: Radiation Oncology | Admitting: Radiation Oncology

## 2019-04-15 ENCOUNTER — Other Ambulatory Visit: Payer: Self-pay

## 2019-04-15 DIAGNOSIS — R911 Solitary pulmonary nodule: Secondary | ICD-10-CM | POA: Diagnosis not present

## 2019-04-15 DIAGNOSIS — C052 Malignant neoplasm of uvula: Secondary | ICD-10-CM | POA: Insufficient documentation

## 2019-04-15 MED ORDER — FLUDEOXYGLUCOSE F - 18 (FDG) INJECTION
7.4300 | Freq: Once | INTRAVENOUS | Status: AC | PRN
  Administered 2019-04-15: 7.43 via INTRAVENOUS

## 2019-04-18 DIAGNOSIS — J449 Chronic obstructive pulmonary disease, unspecified: Secondary | ICD-10-CM | POA: Diagnosis not present

## 2019-04-25 ENCOUNTER — Other Ambulatory Visit: Payer: Self-pay

## 2019-04-25 ENCOUNTER — Ambulatory Visit (INDEPENDENT_AMBULATORY_CARE_PROVIDER_SITE_OTHER): Payer: Medicare Other | Admitting: Gastroenterology

## 2019-04-25 ENCOUNTER — Encounter: Payer: Self-pay | Admitting: Gastroenterology

## 2019-04-25 VITALS — BP 146/69 | HR 98 | Temp 97.3°F | Ht 65.5 in | Wt 127.0 lb

## 2019-04-25 DIAGNOSIS — Z86010 Personal history of colonic polyps: Secondary | ICD-10-CM | POA: Insufficient documentation

## 2019-04-25 DIAGNOSIS — Z7689 Persons encountering health services in other specified circumstances: Secondary | ICD-10-CM | POA: Diagnosis not present

## 2019-04-25 DIAGNOSIS — D509 Iron deficiency anemia, unspecified: Secondary | ICD-10-CM

## 2019-04-25 DIAGNOSIS — S81802A Unspecified open wound, left lower leg, initial encounter: Secondary | ICD-10-CM | POA: Diagnosis not present

## 2019-04-25 DIAGNOSIS — J449 Chronic obstructive pulmonary disease, unspecified: Secondary | ICD-10-CM | POA: Diagnosis not present

## 2019-04-25 DIAGNOSIS — E039 Hypothyroidism, unspecified: Secondary | ICD-10-CM | POA: Diagnosis not present

## 2019-04-25 DIAGNOSIS — Z888 Allergy status to other drugs, medicaments and biological substances status: Secondary | ICD-10-CM | POA: Diagnosis not present

## 2019-04-25 DIAGNOSIS — Z886 Allergy status to analgesic agent status: Secondary | ICD-10-CM | POA: Diagnosis not present

## 2019-04-25 DIAGNOSIS — Z9889 Other specified postprocedural states: Secondary | ICD-10-CM

## 2019-04-25 DIAGNOSIS — L97228 Non-pressure chronic ulcer of left calf with other specified severity: Secondary | ICD-10-CM | POA: Diagnosis not present

## 2019-04-25 DIAGNOSIS — Z79899 Other long term (current) drug therapy: Secondary | ICD-10-CM | POA: Diagnosis not present

## 2019-04-25 DIAGNOSIS — M479 Spondylosis, unspecified: Secondary | ICD-10-CM | POA: Diagnosis not present

## 2019-04-25 DIAGNOSIS — Z87891 Personal history of nicotine dependence: Secondary | ICD-10-CM | POA: Diagnosis not present

## 2019-04-25 DIAGNOSIS — E119 Type 2 diabetes mellitus without complications: Secondary | ICD-10-CM | POA: Diagnosis not present

## 2019-04-25 DIAGNOSIS — L859 Epidermal thickening, unspecified: Secondary | ICD-10-CM | POA: Diagnosis not present

## 2019-04-25 DIAGNOSIS — Z961 Presence of intraocular lens: Secondary | ICD-10-CM | POA: Diagnosis not present

## 2019-04-25 DIAGNOSIS — M7989 Other specified soft tissue disorders: Secondary | ICD-10-CM | POA: Diagnosis not present

## 2019-04-25 NOTE — Progress Notes (Signed)
Primary Care Physician:  [name], MD  Primary Gastroenterologist:  Garfield Cornea, MD   Chief Complaint  Patient presents with  . Colonoscopy    consult, doing ok    HPI:  Lindsey Combs is a 73 y.o. female here for consult for colonoscopy.  Colonoscopy and EGD in June 2014 with multiple colonic polyps status post removal/treatment, colonic diverticulosis.  Pathology hyperplastic.  Next colonoscopy recommended in June 2019.  On EGD she had a hiatal hernia, H. pylori.  Status post treatment.  Patient also follows with hematology/oncology for IDA, uvular cancer.  She had resection in November 2017.  She has right upper lobe pulmonary nodule which is being followed as well.  Recent PET scan showed no abnormal hypermetabolic activity within the liver, pancreas, spleen, abdomen and pelvis.  She had a new masslike area of architectural distortion in the superior segment of the right lower lobe which demonstrates low-level hypermetabolism felt to be nonspecific, favor infectious or inflammatory etiology.  She follows up with radiation oncology next week.  Patient tells me that her bowel movements are normal.  Denies melena or rectal bleeding.  No abdominal pain.  Appetite improved.  Has been able to gain some weight back.  [place] since [place].  In October 2019 she weighed 70 pounds.  Now she is up to 127 pounds.  She denies dysphagia.  No heartburn.  She does not want to have a colonoscopy.  Current Outpatient Medications  Medication Sig Dispense Refill  . acetaminophen (TYLENOL) 325 MG tablet Take 650 mg by mouth every 4 (four) hours as needed.     [name] albuterol (PROVENTIL, VENTOLIN) (5 MG/ML) 0.5% NEBU Take 1.25 mg by nebulization 3 (three) times daily.     [name] ALPRAZolam (XANAX) 0.5 MG tablet Take 1 tablet by mouth 2 (two) times daily.     [name] amLODipine (NORVASC) 5 MG tablet TAKE 1 TABLET BY MOUTH ONCE A DAY. 30 tablet 1  . atorvastatin (LIPITOR) 20 MG tablet Take 1 tablet by  mouth daily.    . busPIRone (BUSPAR) 7.5 MG tablet Take 1 tablet by mouth 2 (two) times a day.    . Carboxymethylcellulose Sodium (ARTIFICIAL TEARS OP) Apply to eye as needed.    . Ergocalciferol (VITAMIN D2) 10 MCG (400 UNIT) TABS Take 1 tablet by mouth daily.    . fluticasone (FLONASE) 50 MCG/ACT nasal spray Place 1 spray into both nostrils daily.    . fluticasone furoate-vilanterol (BREO ELLIPTA) 100-25 MCG/INH AEPB Inhale 1 puff into the lungs daily.    [name] loratadine (CLARITIN) 10 MG tablet Take 10 mg by mouth daily.    . Pseudoephedrine-Guaifenesin (MUCINEX D MAX STRENGTH) [phone] MG TB12 Take by mouth as needed.    . triamcinolone cream (KENALOG) 0.5 % Apply 1 application topically as needed.    . Vitamin D, Ergocalciferol, (DRISDOL) 1.25 MG (50000 UT) CAPS capsule Take 50,000 Units by mouth every 7 (seven) days.     No current facility-administered medications for this visit.     Allergies as of 04/25/2019 - Review Complete 04/25/2019  Allergen Reaction Noted  . Mucinex [guaifenesin er]  10/19/2018  . Prednisone Nausea Only 06/14/2017  . Chantix [varenicline] Other (See Comments) 04/03/2015  . Codeine Itching 04/10/2012    Past Medical History:  Diagnosis Date  . Acute respiratory failure with hypoxia ([place])   . Anxiety   . Cancer (HCC)    uvular per pt  . COPD (chronic obstructive pulmonary disease) (  [place])   . DDD (degenerative disc disease), lumbar   . Degenerative joint disease of spine   . Diverticulosis   . GERD (gastroesophageal reflux disease)   . H. pylori infection    treated 02/2013.  [name] History of UTI   . Hypercholesterolemia   . Hypertension   . Hyponatremia   . Peripheral neuropathy   . Shortness of breath    with exertion  . Tobacco abuse     Past Surgical History:  Procedure Laterality Date  . ABDOMINAL EXPLORATION SURGERY     age 49, large ovarian cyst  . BIOPSY N/A 03/01/2013   HER:DEYC hiatal hernia; otherwise, normal examination/ Status post  biopsies as described above. SB bx negative for Celiac. +h/pylori  . CATARACT EXTRACTION W/ INTRAOCULAR LENS  IMPLANT, BILATERAL    . COLONOSCOPY  10/16/09   Jenkins:3 polypsin the sigmoid colon/polyps in the descending colon and the rectum/scattered diverticulum. hyperplastic  . COLONOSCOPY WITH ESOPHAGOGASTRODUODENOSCOPY (EGD) N/A 03/01/2013   XKG:YJEHUDJS colonic polyps - treated/removed as described above. Colonic diverticulosis. Hyperplastic. Next TCS 02/2018.  [name] DIRECT LARYNGOSCOPY N/A 06/08/2016   Procedure: DIRECT LARYNGOSCOPY AND BIOPSY;  Surgeon: Leta Baptist, MD;  Location: [facility] OR;  Service: ENT;  Laterality: N/A;  . PARTIAL HYSTERECTOMY     age 63  . UVULECTOMY      Family History  Problem Relation Age of Onset  . Leukemia Father   . Thyroid disease Father   . Stomach cancer Paternal Grandfather   . Colon cancer Neg Hx     Social History   Socioeconomic History  . Marital status: Divorced    Spouse name: Not on file  . Number of children: 4  . Years of education: Not on file  . Highest education level: Not on file  Occupational History  . Not on file  Social Needs  . Financial resource strain: Not on file  . Food insecurity    Worry: Not on file    Inability: Not on file  . Transportation needs    Medical: Not on file    Non-medical: Not on file  Tobacco Use  . Smoking status: Former Smoker    Packs/day: 1.50    Years: 50.00    Pack years: 75.00    Types: Cigarettes    Quit date: 07/16/2018    Years since quitting: 0.7  . Smokeless tobacco: Never Used  Substance and Sexual Activity  . Alcohol use: Not Currently    Comment: glass of wine occasionally  . Drug use: No  . Sexual activity: Not on file  Lifestyle  . Physical activity    Days per week: Not on file    Minutes per session: Not on file  . Stress: Not on file  Relationships  . Social Herbalist on phone: Not on file    Gets together: Not on file    Attends religious service: Not on file     Active member of club or organization: Not on file    Attends meetings of clubs or organizations: Not on file    Relationship status: Not on file  . Intimate partner violence    Fear of current or ex partner: Not on file    Emotionally abused: Not on file    Physically abused: Not on file    Forced sexual activity: Not on file  Other Topics Concern  . Not on file  Social History Narrative  . Not on file  ROS:  General: Negative for anorexia, weight loss, fever, chills, fatigue, weakness. Eyes: Negative for vision changes.  ENT: Negative for hoarseness, difficulty swallowing , nasal congestion. CV: Negative for chest pain, angina, palpitations, positive dyspnea on exertion, peripheral edema.  Respiratory: Negative for dyspnea at rest, positive dyspnea on exertion, positive cough, sputum, positive wheezing.  GI: See history of present illness. GU:  Negative for dysuria, hematuria, urinary incontinence, urinary frequency, nocturnal urination.  MS: Negative for joint pain, low back pain.  Derm: Negative for rash or itching.  Neuro: Negative for weakness, abnormal sensation, seizure, frequent headaches, memory loss, confusion.  Psych: Negative for anxiety, depression, suicidal ideation, hallucinations.  Endo: Negative for unusual weight change.  Heme: Negative for bruising or bleeding. Allergy: Negative for rash or hives.    Physical Examination:  BP (!) 146/69   Pulse 98   Temp (!) 97.3 F (36.3 C) (Temporal)   Ht 5' 5.5" (1.664 m)   Wt 127 lb (57.6 kg)   BMI 20.81 kg/m    General: Thin elderly female, no acute distress.  Nasal cannula oxygen in place. Head: Normocephalic, atraumatic.   Eyes: Conjunctiva pink, no icterus. Neck: Supple without thyromegaly, masses, or lymphadenopathy.  Lungs diffuse wheezing throughout Heart: Regular rate and rhythm, no murmurs rubs or gallops.  Abdomen: Bowel sounds are normal, nontender, nondistended, no hepatosplenomegaly or  masses, no abdominal bruits or    hernia , no rebound or guarding.   Rectal: Not performed Extremities: No lower extremity edema. No clubbing or deformities.  Neuro: Alert and oriented x 4 , grossly normal neurologically.  Skin: Warm and dry, no rash or jaundice.   Psych: Alert and cooperative, normal mood and affect.  Labs: Lab Results  Component Value Date   CREATININE 0.55 02/07/2019   BUN 13 02/07/2019   NA 138 02/07/2019   K 3.9 02/07/2019   CL 96 (L) 02/07/2019   CO2 32 02/07/2019   Lab Results  Component Value Date   WBC 8.1 02/07/2019   HGB 9.7 (L) 02/07/2019   HCT 31.6 (L) 02/07/2019   MCV 94.6 02/07/2019   PLT 254 02/07/2019   Lab Results  Component Value Date   ALT 14 02/07/2019   AST 16 02/07/2019   ALKPHOS 53 02/07/2019   BILITOT 0.5 02/07/2019   Lab Results  Component Value Date   ferritin 27 10/17/2018        FERRITIN 203 02/07/2019   Lab Results  Component Value Date   VITAMINB12 590 12/12/2017   Lab Results  Component Value Date   FOLATE 24.0 01/21/2013     Imaging Studies: Nm Pet Image Restag (ps) Skull Base To Thigh  Result Date: 04/16/2019 CLINICAL DATA:  Subsequent treatment strategy for vulvar neoplasm. EXAM: NUCLEAR MEDICINE PET SKULL BASE TO THIGH TECHNIQUE: 7.4 mCi F-18 FDG was injected intravenously. Full-ring PET imaging was performed from the skull base to thigh after the radiotracer. CT data was obtained and used for attenuation correction and anatomic localization. Fasting blood glucose: 99 mg/dl COMPARISON:  PET-CT 12/25/2017 FINDINGS: Mediastinal blood pool activity: SUV max 2.1 Liver activity: SUV max N/A NECK: No hypermetabolic lymph nodes in the neck. Incidental CT findings: none CHEST: Previously demonstrated thick-walled small cavitary nodular appearing area in the posterior aspect of the right upper lobe (axial image 114 of series 3) is similar in size measuring 1.5 x 1.0 cm and demonstrates only low level metabolic activity  (SUVmax = 1.2) . there is a new ill-defined predominantly ground-glass attenuation  nodule in the right upper lobe measuring 8 mm (axial image 103 of series 3) near the apex which demonstrates low-level metabolic activity (SUVmax = 1.4) . New mass-like area of architectural distortion in the superior segment of the right lower lobe (axial image 141 of series 3) measuring 3.4 x 3.1 cm demonstrates hypermetabolism (SUVmax = 2.3) . In the central aspect of the left lower lobe there is also a small focus of hypermetabolism (SUVmax = 5.3) which appears to correspond to vascular structures as there is no identifiable nodule or mass in this region on CT images. Incidental CT findings: A few other smaller pulmonary nodules are also noted, similar to the prior examination. Aortic atherosclerosis, as well as calcified atherosclerotic plaque in the left main, left anterior descending, left circumflex and right coronary arteries. Calcifications of the aortic valve. Diffuse bronchial wall thickening with mild to moderate centrilobular and paraseptal emphysema. ABDOMEN/PELVIS: No abnormal hypermetabolic activity within the liver, pancreas, adrenal glands, or spleen. No hypermetabolic lymph nodes in the abdomen or pelvis. Incidental CT findings: Nonobstructive calculi and/or vascular calcifications associated with both kidneys, largest of which is in the interpolar collecting system of the right kidney measuring 5 mm. Aortic atherosclerosis. A few scattered colonic diverticulae are noted, without surrounding inflammatory changes to suggest an acute diverticulitis at this time. Status post hysterectomy. SKELETON: Low-level hypermetabolism surrounding the left glenohumeral joint, presumably physiologic. No other focal hypermetabolic activity to suggest skeletal metastasis. Incidental CT findings: none IMPRESSION: 1. Stable size and appearance of previously noted cavitary area in the posterior aspect of the right upper lobe which  demonstrates only low level metabolic activity. This is favored to represent a benign area of post infectious or inflammatory scarring. 2. Today's study demonstrates a new mass-like area of architectural distortion in the superior segment of the right lower lobe which demonstrates low-level hypermetabolism. This is nonspecific, but also favored to be of infectious or inflammatory etiology. Likewise, there are other nonspecific areas of ground-glass attenuation and low-level hypermetabolism in the lungs on today's examination which warrant continued attention on follow-up studies. 3. No signs of locally recurrent disease or definite metastatic disease in the abdomen or pelvis. 4. Aortic atherosclerosis, in addition to left main and 3 vessel coronary artery disease. Assessment for potential risk factor modification, dietary therapy or pharmacologic therapy may be warranted, if clinically indicated. 5. There are calcifications of the aortic valve. Echocardiographic correlation for evaluation of potential valvular dysfunction may be warranted if clinically indicated. 6. Additional incidental findings, as above. Electronically Signed   By: Vinnie Langton M.D.   On: 04/16/2019 10:30

## 2019-04-25 NOTE — Patient Instructions (Signed)
Please let us know if you change your mind and desire to have colonoscopy done.   Let us know if you develop change in bowel habits, blood in stool, appetite issues, weight loss, or your cancer doctor wants you to have colonoscopy.

## 2019-04-25 NOTE — Assessment & Plan Note (Signed)
Pleasant 72 year old female with history of severe COPD, uvular cancer, IDA who presents for consideration of colonoscopy at the recommendation of her PCP.  Previously advised for colonoscopy in 2019 for history of polyps.  Patient has required iron infusions earlier this year.  No overt GI bleeding.  Hemoccult status unknown.  Patient tells me she does not want to have a colonoscopy.  She has had major health issues over the past couple of years and feels like she is finally making some significant improvements.  Her weight is improved.  She continues to have significant breathing issues.  Right now she does not want to consider colonoscopy.  If she has any change in bowel habits, blood in the stool, worsening anemia, weight loss, or her oncologist desires colonoscopy, she may consider in the future.  She will let us know.

## 2019-04-25 NOTE — Progress Notes (Signed)
Send copy to [facility] and PCP.

## 2019-04-26 NOTE — Progress Notes (Signed)
CC'ED TO PCP AND [facility]

## 2019-05-08 DIAGNOSIS — J9801 Acute bronchospasm: Secondary | ICD-10-CM | POA: Diagnosis not present

## 2019-05-08 DIAGNOSIS — M6281 Muscle weakness (generalized): Secondary | ICD-10-CM | POA: Diagnosis not present

## 2019-05-08 DIAGNOSIS — M25561 Pain in right knee: Secondary | ICD-10-CM | POA: Diagnosis not present

## 2019-05-08 DIAGNOSIS — M25512 Pain in left shoulder: Secondary | ICD-10-CM | POA: Diagnosis not present

## 2019-05-08 DIAGNOSIS — I10 Essential (primary) hypertension: Secondary | ICD-10-CM | POA: Diagnosis not present

## 2019-05-08 DIAGNOSIS — J441 Chronic obstructive pulmonary disease with (acute) exacerbation: Secondary | ICD-10-CM | POA: Diagnosis not present

## 2019-05-08 DIAGNOSIS — M25562 Pain in left knee: Secondary | ICD-10-CM | POA: Diagnosis not present

## 2019-05-09 ENCOUNTER — Ambulatory Visit (HOSPITAL_COMMUNITY)
Admission: RE | Admit: 2019-05-09 | Discharge: 2019-05-09 | Disposition: A | Discharge status: Home / Self Care | Payer: Medicare Other | Source: Ambulatory Visit | Attending: Family Medicine | Admitting: Family Medicine

## 2019-05-09 ENCOUNTER — Other Ambulatory Visit: Payer: Self-pay

## 2019-05-09 ENCOUNTER — Other Ambulatory Visit (HOSPITAL_COMMUNITY): Payer: Self-pay | Admitting: Family Medicine

## 2019-05-09 DIAGNOSIS — T8189XD Other complications of procedures, not elsewhere classified, subsequent encounter: Secondary | ICD-10-CM | POA: Diagnosis not present

## 2019-05-09 DIAGNOSIS — S81802A Unspecified open wound, left lower leg, initial encounter: Secondary | ICD-10-CM | POA: Diagnosis not present

## 2019-05-09 DIAGNOSIS — Z7689 Persons encountering health services in other specified circumstances: Secondary | ICD-10-CM | POA: Diagnosis not present

## 2019-05-09 DIAGNOSIS — Z87891 Personal history of nicotine dependence: Secondary | ICD-10-CM | POA: Diagnosis not present

## 2019-05-09 DIAGNOSIS — M25512 Pain in left shoulder: Secondary | ICD-10-CM | POA: Diagnosis not present

## 2019-05-09 DIAGNOSIS — Z79899 Other long term (current) drug therapy: Secondary | ICD-10-CM | POA: Diagnosis not present

## 2019-05-09 DIAGNOSIS — E119 Type 2 diabetes mellitus without complications: Secondary | ICD-10-CM | POA: Diagnosis not present

## 2019-05-09 DIAGNOSIS — E039 Hypothyroidism, unspecified: Secondary | ICD-10-CM | POA: Diagnosis not present

## 2019-05-09 DIAGNOSIS — L859 Epidermal thickening, unspecified: Secondary | ICD-10-CM | POA: Diagnosis not present

## 2019-05-09 DIAGNOSIS — J449 Chronic obstructive pulmonary disease, unspecified: Secondary | ICD-10-CM | POA: Diagnosis not present

## 2019-05-10 DIAGNOSIS — J441 Chronic obstructive pulmonary disease with (acute) exacerbation: Secondary | ICD-10-CM | POA: Diagnosis not present

## 2019-05-10 DIAGNOSIS — J9801 Acute bronchospasm: Secondary | ICD-10-CM | POA: Diagnosis not present

## 2019-05-13 DIAGNOSIS — M25512 Pain in left shoulder: Secondary | ICD-10-CM | POA: Diagnosis not present

## 2019-05-13 DIAGNOSIS — R278 Other lack of coordination: Secondary | ICD-10-CM | POA: Diagnosis not present

## 2019-05-15 DIAGNOSIS — M25512 Pain in left shoulder: Secondary | ICD-10-CM | POA: Diagnosis not present

## 2019-05-15 DIAGNOSIS — R278 Other lack of coordination: Secondary | ICD-10-CM | POA: Diagnosis not present

## 2019-05-16 DIAGNOSIS — S81802A Unspecified open wound, left lower leg, initial encounter: Secondary | ICD-10-CM | POA: Diagnosis not present

## 2019-05-16 DIAGNOSIS — I7090 Unspecified atherosclerosis: Secondary | ICD-10-CM | POA: Diagnosis not present

## 2019-05-16 DIAGNOSIS — I70213 Atherosclerosis of native arteries of extremities with intermittent claudication, bilateral legs: Secondary | ICD-10-CM | POA: Diagnosis not present

## 2019-05-16 DIAGNOSIS — Z7689 Persons encountering health services in other specified circumstances: Secondary | ICD-10-CM | POA: Diagnosis not present

## 2019-05-20 DIAGNOSIS — R278 Other lack of coordination: Secondary | ICD-10-CM | POA: Diagnosis not present

## 2019-05-20 DIAGNOSIS — M25512 Pain in left shoulder: Secondary | ICD-10-CM | POA: Diagnosis not present

## 2019-05-22 DIAGNOSIS — J9611 Chronic respiratory failure with hypoxia: Secondary | ICD-10-CM | POA: Diagnosis not present

## 2019-05-22 DIAGNOSIS — J449 Chronic obstructive pulmonary disease, unspecified: Secondary | ICD-10-CM | POA: Diagnosis not present

## 2019-05-22 DIAGNOSIS — M25512 Pain in left shoulder: Secondary | ICD-10-CM | POA: Diagnosis not present

## 2019-05-22 DIAGNOSIS — Z9981 Dependence on supplemental oxygen: Secondary | ICD-10-CM | POA: Diagnosis not present

## 2019-05-22 DIAGNOSIS — R278 Other lack of coordination: Secondary | ICD-10-CM | POA: Diagnosis not present

## 2019-05-27 DIAGNOSIS — M25512 Pain in left shoulder: Secondary | ICD-10-CM | POA: Diagnosis not present

## 2019-05-27 DIAGNOSIS — R278 Other lack of coordination: Secondary | ICD-10-CM | POA: Diagnosis not present

## 2019-05-29 DIAGNOSIS — M25512 Pain in left shoulder: Secondary | ICD-10-CM | POA: Diagnosis not present

## 2019-05-29 DIAGNOSIS — R278 Other lack of coordination: Secondary | ICD-10-CM | POA: Diagnosis not present

## 2019-05-30 DIAGNOSIS — Z7689 Persons encountering health services in other specified circumstances: Secondary | ICD-10-CM | POA: Diagnosis not present

## 2019-06-04 DIAGNOSIS — M25512 Pain in left shoulder: Secondary | ICD-10-CM | POA: Diagnosis not present

## 2019-06-04 DIAGNOSIS — R278 Other lack of coordination: Secondary | ICD-10-CM | POA: Diagnosis not present

## 2019-06-05 DIAGNOSIS — R278 Other lack of coordination: Secondary | ICD-10-CM | POA: Diagnosis not present

## 2019-06-05 DIAGNOSIS — M25512 Pain in left shoulder: Secondary | ICD-10-CM | POA: Diagnosis not present

## 2019-06-08 DIAGNOSIS — J9801 Acute bronchospasm: Secondary | ICD-10-CM | POA: Diagnosis not present

## 2019-06-08 DIAGNOSIS — J441 Chronic obstructive pulmonary disease with (acute) exacerbation: Secondary | ICD-10-CM | POA: Diagnosis not present

## 2019-06-10 DIAGNOSIS — R278 Other lack of coordination: Secondary | ICD-10-CM | POA: Diagnosis not present

## 2019-06-10 DIAGNOSIS — M25512 Pain in left shoulder: Secondary | ICD-10-CM | POA: Diagnosis not present

## 2019-06-12 DIAGNOSIS — R278 Other lack of coordination: Secondary | ICD-10-CM | POA: Diagnosis not present

## 2019-06-12 DIAGNOSIS — M25512 Pain in left shoulder: Secondary | ICD-10-CM | POA: Diagnosis not present

## 2019-06-13 DIAGNOSIS — J449 Chronic obstructive pulmonary disease, unspecified: Secondary | ICD-10-CM | POA: Diagnosis not present

## 2019-06-13 DIAGNOSIS — Z79899 Other long term (current) drug therapy: Secondary | ICD-10-CM | POA: Diagnosis not present

## 2019-06-13 DIAGNOSIS — S81802A Unspecified open wound, left lower leg, initial encounter: Secondary | ICD-10-CM | POA: Diagnosis not present

## 2019-06-13 DIAGNOSIS — E119 Type 2 diabetes mellitus without complications: Secondary | ICD-10-CM | POA: Diagnosis not present

## 2019-06-13 DIAGNOSIS — Z885 Allergy status to narcotic agent status: Secondary | ICD-10-CM | POA: Diagnosis not present

## 2019-06-13 DIAGNOSIS — E039 Hypothyroidism, unspecified: Secondary | ICD-10-CM | POA: Diagnosis not present

## 2019-06-13 DIAGNOSIS — Z888 Allergy status to other drugs, medicaments and biological substances status: Secondary | ICD-10-CM | POA: Diagnosis not present

## 2019-06-13 DIAGNOSIS — Z85818 Personal history of malignant neoplasm of other sites of lip, oral cavity, and pharynx: Secondary | ICD-10-CM | POA: Diagnosis not present

## 2019-06-13 DIAGNOSIS — S81802D Unspecified open wound, left lower leg, subsequent encounter: Secondary | ICD-10-CM | POA: Diagnosis not present

## 2019-06-13 DIAGNOSIS — Z87891 Personal history of nicotine dependence: Secondary | ICD-10-CM | POA: Diagnosis not present

## 2019-06-13 DIAGNOSIS — Z7689 Persons encountering health services in other specified circumstances: Secondary | ICD-10-CM | POA: Diagnosis not present

## 2019-06-17 DIAGNOSIS — R278 Other lack of coordination: Secondary | ICD-10-CM | POA: Diagnosis not present

## 2019-06-17 DIAGNOSIS — M25512 Pain in left shoulder: Secondary | ICD-10-CM | POA: Diagnosis not present

## 2019-06-19 DIAGNOSIS — M25512 Pain in left shoulder: Secondary | ICD-10-CM | POA: Diagnosis not present

## 2019-06-19 DIAGNOSIS — R278 Other lack of coordination: Secondary | ICD-10-CM | POA: Diagnosis not present

## 2019-06-19 DIAGNOSIS — E785 Hyperlipidemia, unspecified: Secondary | ICD-10-CM | POA: Diagnosis not present

## 2019-06-19 DIAGNOSIS — I10 Essential (primary) hypertension: Secondary | ICD-10-CM | POA: Diagnosis not present

## 2019-06-20 DIAGNOSIS — Z9981 Dependence on supplemental oxygen: Secondary | ICD-10-CM | POA: Diagnosis not present

## 2019-06-20 DIAGNOSIS — J9601 Acute respiratory failure with hypoxia: Secondary | ICD-10-CM | POA: Diagnosis not present

## 2019-06-20 DIAGNOSIS — Z7689 Persons encountering health services in other specified circumstances: Secondary | ICD-10-CM | POA: Diagnosis not present

## 2019-06-20 DIAGNOSIS — I10 Essential (primary) hypertension: Secondary | ICD-10-CM | POA: Diagnosis not present

## 2019-06-20 DIAGNOSIS — E785 Hyperlipidemia, unspecified: Secondary | ICD-10-CM | POA: Diagnosis not present

## 2019-06-20 DIAGNOSIS — J449 Chronic obstructive pulmonary disease, unspecified: Secondary | ICD-10-CM | POA: Diagnosis not present

## 2019-06-20 DIAGNOSIS — Z79899 Other long term (current) drug therapy: Secondary | ICD-10-CM | POA: Diagnosis not present

## 2019-06-24 DIAGNOSIS — R278 Other lack of coordination: Secondary | ICD-10-CM | POA: Diagnosis not present

## 2019-06-24 DIAGNOSIS — M25512 Pain in left shoulder: Secondary | ICD-10-CM | POA: Diagnosis not present

## 2019-07-08 DIAGNOSIS — J9801 Acute bronchospasm: Secondary | ICD-10-CM | POA: Diagnosis not present

## 2019-07-08 DIAGNOSIS — J441 Chronic obstructive pulmonary disease with (acute) exacerbation: Secondary | ICD-10-CM | POA: Diagnosis not present

## 2019-07-19 DIAGNOSIS — J449 Chronic obstructive pulmonary disease, unspecified: Secondary | ICD-10-CM | POA: Diagnosis not present

## 2019-07-22 DIAGNOSIS — Z1159 Encounter for screening for other viral diseases: Secondary | ICD-10-CM | POA: Diagnosis not present

## 2019-07-22 DIAGNOSIS — G894 Chronic pain syndrome: Secondary | ICD-10-CM | POA: Diagnosis not present

## 2019-07-22 DIAGNOSIS — J441 Chronic obstructive pulmonary disease with (acute) exacerbation: Secondary | ICD-10-CM | POA: Diagnosis not present

## 2019-07-22 DIAGNOSIS — J9611 Chronic respiratory failure with hypoxia: Secondary | ICD-10-CM | POA: Diagnosis not present

## 2019-08-08 DIAGNOSIS — J9801 Acute bronchospasm: Secondary | ICD-10-CM | POA: Diagnosis not present

## 2019-08-08 DIAGNOSIS — J441 Chronic obstructive pulmonary disease with (acute) exacerbation: Secondary | ICD-10-CM | POA: Diagnosis not present

## 2019-08-13 ENCOUNTER — Ambulatory Visit (INDEPENDENT_AMBULATORY_CARE_PROVIDER_SITE_OTHER): Payer: Medicare Other | Admitting: Vascular Surgery

## 2019-08-13 ENCOUNTER — Other Ambulatory Visit: Payer: Self-pay

## 2019-08-13 ENCOUNTER — Encounter: Payer: Self-pay | Admitting: Vascular Surgery

## 2019-08-13 DIAGNOSIS — I739 Peripheral vascular disease, unspecified: Secondary | ICD-10-CM

## 2019-08-13 NOTE — Progress Notes (Signed)
Patient name: Lindsey Combs MRN: [id number] DOB: [birth year] Sex: female  REASON FOR CONSULT: Abnormal ABIs and prolonged wound on the left lower extremity  HPI: Lindsey Combs is a 73 y.o. female, with COPD on home oxygen, hypertension, hyperlipidemia, depression, anxiety, hypothyroidism that presents for evaluation of abnormal ABIs and prolonged wound of the left lower extremity.  Patient currently resides in assisted living facility.  She states she had a wound on her left calf for months.  That has subsequently healed after she states it was debrided locally.  In her opinion she does not need anything done today given that the wound has healed.  She describes chronic neuropathy in both feet with some numbness.  This has been stable for years.  There is no pain in the feet that wake her up from sleep.  No claudication symptoms.  No other wounds on other toes or heels etc.  She is ambulatory with a walker.  Past Medical History:  Diagnosis Date  . Acute respiratory failure with hypoxia ([place])   . Anxiety   . Cancer (HCC)    uvular per pt  . COPD (chronic obstructive pulmonary disease) ([place])   . DDD (degenerative disc disease), lumbar   . Degenerative joint disease of spine   . Diverticulosis   . GERD (gastroesophageal reflux disease)   . H. pylori infection    treated 02/2013.  [name] History of UTI   . Hypercholesterolemia   . Hypertension   . Hyponatremia   . Peripheral neuropathy   . Shortness of breath    with exertion  . Tobacco abuse     Past Surgical History:  Procedure Laterality Date  . ABDOMINAL EXPLORATION SURGERY     age 13, large ovarian cyst  . BIOPSY N/A 03/01/2013   FOY:DXAJ hiatal hernia; otherwise, normal examination/ Status post biopsies as described above. SB bx negative for Celiac. +h/pylori  . CATARACT EXTRACTION W/ INTRAOCULAR LENS  IMPLANT, BILATERAL    . COLONOSCOPY  10/16/09   Jenkins:3 polypsin the sigmoid colon/polyps in the descending colon and the  rectum/scattered diverticulum. hyperplastic  . COLONOSCOPY WITH ESOPHAGOGASTRODUODENOSCOPY (EGD) N/A 03/01/2013   OIN:OMVEHMCN colonic polyps - treated/removed as described above. Colonic diverticulosis. Hyperplastic. Next TCS 02/2018.  Gastric biopsy showed H. pylori.  She completed treatment.  [name] DIRECT LARYNGOSCOPY N/A 06/08/2016   Procedure: DIRECT LARYNGOSCOPY AND BIOPSY;  Surgeon: Leta Baptist, MD;  Location: [facility] OR;  Service: ENT;  Laterality: N/A;  . PARTIAL HYSTERECTOMY     age 15  . UVULECTOMY      Family History  Problem Relation Age of Onset  . Leukemia Father   . Thyroid disease Father   . Stomach cancer Paternal Grandfather   . Colon cancer Neg Hx     SOCIAL HISTORY: Social History   Socioeconomic History  . Marital status: Divorced    Spouse name: Not on file  . Number of children: 4  . Years of education: Not on file  . Highest education level: Not on file  Occupational History  . Not on file  Social Needs  . Financial resource strain: Not on file  . Food insecurity    Worry: Not on file    Inability: Not on file  . Transportation needs    Medical: Not on file    Non-medical: Not on file  Tobacco Use  . Smoking status: Former Smoker    Packs/day: 1.50    Years: 50.00    Pack years:  75.00    Types: Cigarettes    Quit date: 07/16/2018    Years since quitting: 1.0  . Smokeless tobacco: Never Used  Substance and Sexual Activity  . Alcohol use: Not Currently    Comment: glass of wine occasionally  . Drug use: No  . Sexual activity: Not on file  Lifestyle  . Physical activity    Days per week: Not on file    Minutes per session: Not on file  . Stress: Not on file  Relationships  . Social Herbalist on phone: Not on file    Gets together: Not on file    Attends religious service: Not on file    Active member of club or organization: Not on file    Attends meetings of clubs or organizations: Not on file    Relationship status: Not on file  .  Intimate partner violence    Fear of current or ex partner: Not on file    Emotionally abused: Not on file    Physically abused: Not on file    Forced sexual activity: Not on file  Other Topics Concern  . Not on file  Social History Narrative  . Not on file    Allergies  Allergen Reactions  . Mucinex [Guaifenesin Er]   . Prednisone Nausea Only  . Chantix [Varenicline] Other (See Comments)    Mental status changes  . Codeine Itching    Current Outpatient Medications  Medication Sig Dispense Refill  . acetaminophen (TYLENOL) 325 MG tablet Take 650 mg by mouth every 4 (four) hours as needed.     [name] albuterol (PROVENTIL, VENTOLIN) (5 MG/ML) 0.5% NEBU Take 1.25 mg by nebulization 3 (three) times daily.     [name] ALPRAZolam (XANAX) 0.5 MG tablet Take 1 tablet by mouth 2 (two) times daily.     [name] amLODipine (NORVASC) 5 MG tablet TAKE 1 TABLET BY MOUTH ONCE A DAY. 30 tablet 1  . atorvastatin (LIPITOR) 20 MG tablet Take 1 tablet by mouth daily.    . busPIRone (BUSPAR) 7.5 MG tablet Take 1 tablet by mouth 2 (two) times a day.    . Carboxymethylcellulose Sodium (ARTIFICIAL TEARS OP) Apply to eye as needed.    . Ergocalciferol (VITAMIN D2) 10 MCG (400 UNIT) TABS Take 1 tablet by mouth daily.    . fluticasone (FLONASE) 50 MCG/ACT nasal spray Place 1 spray into both nostrils daily.    . fluticasone furoate-vilanterol (BREO ELLIPTA) 100-25 MCG/INH AEPB Inhale 1 puff into the lungs daily.    [name] loratadine (CLARITIN) 10 MG tablet Take 10 mg by mouth daily.    . Pseudoephedrine-Guaifenesin (MUCINEX D MAX STRENGTH) [phone] MG TB12 Take by mouth as needed.    . triamcinolone cream (KENALOG) 0.5 % Apply 1 application topically as needed.    . Vitamin D, Ergocalciferol, (DRISDOL) 1.25 MG (50000 UT) CAPS capsule Take 50,000 Units by mouth every 7 (seven) days.     No current facility-administered medications for this visit.     REVIEW OF SYSTEMS:  [X]  denotes positive finding, [ ]  denotes negative  finding Cardiac  Comments:  Chest pain or chest pressure:    Shortness of breath upon exertion: x   Short of breath when lying flat: x   Irregular heart rhythm:        Vascular    Pain in calf, thigh, or hip brought on by ambulation:    Pain in feet at night that wakes you up from  your sleep:     Blood clot in your veins:    Leg swelling:         Pulmonary    Oxygen at home:    Productive cough:     Wheezing:         Neurologic    Sudden weakness in arms or legs:     Sudden numbness in arms or legs:     Sudden onset of difficulty speaking or slurred speech:    Temporary loss of vision in one eye:     Problems with dizziness:         Gastrointestinal    Blood in stool:     Vomited blood:         Genitourinary    Burning when urinating:     Blood in urine:        Psychiatric    Major depression:         Hematologic    Bleeding problems:    Problems with blood clotting too easily:        Skin    Rashes or ulcers:        Constitutional    Fever or chills:      PHYSICAL EXAM: Vitals:   08/13/19 0942  BP: 121/70  Pulse: 86  Resp: 20  Temp: 97.6 F (36.4 C)  SpO2: 96%  Weight: 136 lb (61.7 kg)  Height: 5\' 5"  (1.651 m)    GENERAL: The patient is a well-nourished female, in no acute distress. The vital signs are documented above. CARDIAC: There is a regular rate and rhythm.  VASCULAR:  Palpable femoral pulses bilaterally No palpable pedal pulses Left medial calf wound just above the ankle is now completely healed with epithelialization No other foot wounds. Feet are warm PULMONARY: There is good air exchange bilaterally without wheezing or rales. ABDOMEN: Soft and non-tender with normal pitched bowel sounds.  MUSCULOSKELETAL: There are no major deformities or cyanosis. NEUROLOGIC: No focal weakness or paresthesias are detected. SKIN: There are no ulcers or rashes noted. PSYCHIATRIC: The patient has a normal affect.      DATA:   ABIs from outside  facility: ABI on the right is 0.78 with monophasic waveform at the ankle and ABI on the left is 0.75 with multiphasic waveform in the posterior tibial and monophasic waveform in the dorsalis pedis.  Assessment/Plan:  73 year old female currently residing in assisted living facility that presents for evaluation of abnormal ABIs and a prolonged wound on the left lower extremity.  As pictured above the wound is now completely healed on the medial left calf.  She has no other lower extremity tissue loss.  She has no significant rest pain at night other than chronic neuropathy.  I agree with the patient that I do not think she needs any intervention at this time given that the wound has healed.  Discussed that she give [place] a call if she has any other wounds that develop and she would need lower extremity arteriogram.  She certainly has underlying PAD as noted on her noninvasive imaging.   Marty Heck, MD [facility] Office: [phone] Pager: [phone]

## 2019-08-16 ENCOUNTER — Encounter (HOSPITAL_COMMUNITY): Payer: Self-pay

## 2019-08-16 ENCOUNTER — Inpatient Hospital Stay (HOSPITAL_COMMUNITY): Payer: Medicare Other | Attending: Hematology

## 2019-08-16 ENCOUNTER — Ambulatory Visit (HOSPITAL_COMMUNITY)
Admission: RE | Admit: 2019-08-16 | Discharge: 2019-08-16 | Disposition: A | Discharge status: Home / Self Care | Payer: Medicare Other | Source: Ambulatory Visit | Attending: Hematology | Admitting: Hematology

## 2019-08-16 ENCOUNTER — Other Ambulatory Visit: Payer: Self-pay

## 2019-08-16 DIAGNOSIS — F419 Anxiety disorder, unspecified: Secondary | ICD-10-CM | POA: Insufficient documentation

## 2019-08-16 DIAGNOSIS — D508 Other iron deficiency anemias: Secondary | ICD-10-CM

## 2019-08-16 DIAGNOSIS — E871 Hypo-osmolality and hyponatremia: Secondary | ICD-10-CM | POA: Diagnosis not present

## 2019-08-16 DIAGNOSIS — Z79899 Other long term (current) drug therapy: Secondary | ICD-10-CM | POA: Diagnosis not present

## 2019-08-16 DIAGNOSIS — R911 Solitary pulmonary nodule: Secondary | ICD-10-CM | POA: Insufficient documentation

## 2019-08-16 DIAGNOSIS — K219 Gastro-esophageal reflux disease without esophagitis: Secondary | ICD-10-CM | POA: Diagnosis not present

## 2019-08-16 DIAGNOSIS — E7800 Pure hypercholesterolemia, unspecified: Secondary | ICD-10-CM | POA: Diagnosis not present

## 2019-08-16 DIAGNOSIS — M2550 Pain in unspecified joint: Secondary | ICD-10-CM | POA: Diagnosis not present

## 2019-08-16 DIAGNOSIS — R200 Anesthesia of skin: Secondary | ICD-10-CM | POA: Insufficient documentation

## 2019-08-16 DIAGNOSIS — C052 Malignant neoplasm of uvula: Secondary | ICD-10-CM | POA: Diagnosis not present

## 2019-08-16 DIAGNOSIS — Z7689 Persons encountering health services in other specified circumstances: Secondary | ICD-10-CM | POA: Diagnosis not present

## 2019-08-16 DIAGNOSIS — R0602 Shortness of breath: Secondary | ICD-10-CM | POA: Diagnosis not present

## 2019-08-16 DIAGNOSIS — I10 Essential (primary) hypertension: Secondary | ICD-10-CM | POA: Insufficient documentation

## 2019-08-16 DIAGNOSIS — J449 Chronic obstructive pulmonary disease, unspecified: Secondary | ICD-10-CM | POA: Diagnosis not present

## 2019-08-16 DIAGNOSIS — M5136 Other intervertebral disc degeneration, lumbar region: Secondary | ICD-10-CM | POA: Insufficient documentation

## 2019-08-16 DIAGNOSIS — R918 Other nonspecific abnormal finding of lung field: Secondary | ICD-10-CM | POA: Diagnosis not present

## 2019-08-16 DIAGNOSIS — Z87891 Personal history of nicotine dependence: Secondary | ICD-10-CM | POA: Insufficient documentation

## 2019-08-16 DIAGNOSIS — G629 Polyneuropathy, unspecified: Secondary | ICD-10-CM | POA: Diagnosis not present

## 2019-08-16 LAB — CBC WITH DIFFERENTIAL/PLATELET
Abs Immature Granulocytes: 0.06 10*3/uL (ref 0.00–0.07)
Basophils Absolute: 0.1 10*3/uL (ref 0.0–0.1)
Basophils Relative: 1 %
Eosinophils Absolute: 0.1 10*3/uL (ref 0.0–0.5)
Eosinophils Relative: 1 %
HCT: 31.5 % — ABNORMAL LOW (ref 36.0–46.0)
Hemoglobin: 9.8 g/dL — ABNORMAL LOW (ref 12.0–15.0)
Immature Granulocytes: 1 %
Lymphocytes Relative: 20 %
Lymphs Abs: 2 10*3/uL (ref 0.7–4.0)
MCH: 28.9 pg (ref 26.0–34.0)
MCHC: 31.1 g/dL (ref 30.0–36.0)
MCV: 92.9 fL (ref 80.0–100.0)
Monocytes Absolute: 0.6 10*3/uL (ref 0.1–1.0)
Monocytes Relative: 6 %
Neutro Abs: 7.6 10*3/uL (ref 1.7–7.7)
Neutrophils Relative %: 71 %
Platelets: 347 10*3/uL (ref 150–400)
RBC: 3.39 MIL/uL — ABNORMAL LOW (ref 3.87–5.11)
RDW: 13 % (ref 11.5–15.5)
WBC: 10.4 10*3/uL (ref 4.0–10.5)
nRBC: 0 % (ref 0.0–0.2)

## 2019-08-16 LAB — COMPREHENSIVE METABOLIC PANEL
ALT: 15 U/L (ref 0–44)
AST: 17 U/L (ref 15–41)
Albumin: 4.1 g/dL (ref 3.5–5.0)
Alkaline Phosphatase: 66 U/L (ref 38–126)
Anion gap: 10 (ref 5–15)
BUN: 11 mg/dL (ref 8–23)
CO2: 28 mmol/L (ref 22–32)
Calcium: 9.8 mg/dL (ref 8.9–10.3)
Chloride: 98 mmol/L (ref 98–111)
Creatinine, Ser: 0.61 mg/dL (ref 0.44–1.00)
GFR calc Af Amer: >60 mL/min (ref >60)
GFR calc non Af Amer: >60 mL/min (ref >60)
Glucose, Bld: 112 mg/dL — ABNORMAL HIGH (ref 70–99)
Potassium: 4.2 mmol/L (ref 3.5–5.1)
Sodium: 136 mmol/L (ref 135–145)
Total Bilirubin: 0.4 mg/dL (ref 0.3–1.2)
Total Protein: 7.4 g/dL (ref 6.5–8.1)

## 2019-08-16 LAB — IRON AND TIBC
Iron: 95 ug/dL (ref 28–170)
Saturation Ratios: 28 % (ref 10.4–31.8)
TIBC: 336 ug/dL (ref 250–450)
UIBC: 241 ug/dL

## 2019-08-16 LAB — VITAMIN B12: Vitamin B-12: 373 pg/mL (ref 180–914)

## 2019-08-16 LAB — FOLATE: Folate: 22.9 ng/mL (ref >5.9)

## 2019-08-16 LAB — FERRITIN: Ferritin: 190 ng/mL (ref 11–307)

## 2019-08-16 MED ORDER — IOHEXOL 300 MG/ML  SOLN
75.0000 mL | Freq: Once | INTRAMUSCULAR | Status: AC | PRN
  Administered 2019-08-16: 75 mL via INTRAVENOUS

## 2019-08-20 ENCOUNTER — Inpatient Hospital Stay (HOSPITAL_BASED_OUTPATIENT_CLINIC_OR_DEPARTMENT_OTHER): Payer: Medicare Other | Admitting: Hematology

## 2019-08-20 ENCOUNTER — Other Ambulatory Visit: Payer: Self-pay

## 2019-08-20 ENCOUNTER — Encounter (HOSPITAL_COMMUNITY): Payer: Self-pay | Admitting: Hematology

## 2019-08-20 VITALS — BP 130/48 | Temp 98.6°F | Resp 14 | Wt 136.9 lb

## 2019-08-20 DIAGNOSIS — I10 Essential (primary) hypertension: Secondary | ICD-10-CM | POA: Diagnosis not present

## 2019-08-20 DIAGNOSIS — Z7689 Persons encountering health services in other specified circumstances: Secondary | ICD-10-CM | POA: Diagnosis not present

## 2019-08-20 DIAGNOSIS — E871 Hypo-osmolality and hyponatremia: Secondary | ICD-10-CM | POA: Diagnosis not present

## 2019-08-20 DIAGNOSIS — E7800 Pure hypercholesterolemia, unspecified: Secondary | ICD-10-CM | POA: Diagnosis not present

## 2019-08-20 DIAGNOSIS — R911 Solitary pulmonary nodule: Secondary | ICD-10-CM | POA: Diagnosis not present

## 2019-08-20 DIAGNOSIS — R0602 Shortness of breath: Secondary | ICD-10-CM | POA: Diagnosis not present

## 2019-08-20 DIAGNOSIS — M5136 Other intervertebral disc degeneration, lumbar region: Secondary | ICD-10-CM | POA: Diagnosis not present

## 2019-08-20 DIAGNOSIS — G629 Polyneuropathy, unspecified: Secondary | ICD-10-CM | POA: Diagnosis not present

## 2019-08-20 DIAGNOSIS — K219 Gastro-esophageal reflux disease without esophagitis: Secondary | ICD-10-CM | POA: Diagnosis not present

## 2019-08-20 DIAGNOSIS — C052 Malignant neoplasm of uvula: Secondary | ICD-10-CM | POA: Diagnosis not present

## 2019-08-20 DIAGNOSIS — J449 Chronic obstructive pulmonary disease, unspecified: Secondary | ICD-10-CM | POA: Diagnosis not present

## 2019-08-20 DIAGNOSIS — Z87891 Personal history of nicotine dependence: Secondary | ICD-10-CM | POA: Diagnosis not present

## 2019-08-20 DIAGNOSIS — M2550 Pain in unspecified joint: Secondary | ICD-10-CM | POA: Diagnosis not present

## 2019-08-20 DIAGNOSIS — R200 Anesthesia of skin: Secondary | ICD-10-CM | POA: Diagnosis not present

## 2019-08-20 DIAGNOSIS — Z79899 Other long term (current) drug therapy: Secondary | ICD-10-CM | POA: Diagnosis not present

## 2019-08-20 NOTE — Assessment & Plan Note (Signed)
1.  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 Nicolette Bang at [facility].  Pathology shows margins negative, P 16+. -She denies any dysphagia.  Physical exam did not reveal any masses in the soft palate or in the neck.  No oropharyngeal masses. -We will follow up on the PET scan at next visit.  2.  Right upper lobe pulmonary nodule: - This was found on previous scans and was also evaluated by Dr. Roxan Hockey.  She quit smoking in October 2019. -She denies any new cough or hemoptysis.  Denies any recent infections. -CT of the chest with contrast from 02/13/2019 showed thick-walled cavitary nodule in the right upper lobe appears stable.  New area of airspace consolidation near the apex of the right upper lobe concerning for inflammation/infection.  There is a new 7 x 10 mm nodule in the right upper lobe which warrants close follow-up. -We reviewed results of CT of the chest with contrast from 08/16/2019.  New solid 1.4 x 1.1 cm left upper lobe nodule abutting the major fissure.  New 2.3 x 2.0 cm airspace opacity in the right middle lobe with several other small new nodules.  Resolution of previously concerning nodule anteriorly in the right upper lobe and improved cavitary focus posteriorly in the right upper lobe.  Unchanged T7 compression fracture. -Because of these changes I have recommended a PET CT scan and follow-up after that.  3.  Anemia of chronic inflammation: - Last Feraheme on 10/25/2018 and 11/01/2018. -Hemoglobin remains around 9.8.  G81, folic acid and iron panel was normal.

## 2019-08-20 NOTE — Patient Instructions (Addendum)
[place] at [facility] Discharge Instructions  You were seen today by Dr. Delton Coombes. He went over your recent  results. He will schedule you for a PET scan. He will see you back after your scan for follow up.   Thank you for choosing [place] at [facility] to provide your oncology and hematology care.  To afford each patient quality time with our provider, please arrive at least 15 minutes before your scheduled appointment time.   If you have a lab appointment with the [place] [facility] please come in thru the  Main Entrance and check in at the main information desk  You need to re-schedule your appointment should you arrive 10 or more minutes late.  We strive to give you quality time with our providers, and arriving late affects you and other patients whose appointments are after yours.  Also, if you no show three or more times for appointments you may be dismissed from the clinic at the providers discretion.     Again, thank you for choosing [facility].  Our hope is that these requests will decrease the amount of time that you wait before being seen by our physicians.       _____________________________________________________________  Should you have questions after your visit to [facility], please contact our office at [phone] between the hours of 8:00 a.m. and 4:30 p.m.  Voicemails left after 4:00 p.m. will not be returned until the following business day.  For prescription refill requests, have your pharmacy contact our office and allow 72 hours.    Cancer Center Support Programs:   > Cancer Support Group  2nd Tuesday of the month 1pm-2pm, Journey Room

## 2019-08-20 NOTE — Progress Notes (Signed)
[address], [place] [address]   CLINIC:  Medical Oncology/Hematology  PCP:  [name], [address] [place] [address] [phone]   REASON FOR VISIT:  Follow-up for lung nodules, ovular cancer and anemia.    INTERVAL HISTORY:  Ms. Freehling 73 y.o. female seen for follow-up of lung nodules.  She had CT scan done.  She denies any bleeding per rectum or melena.  She reports pain all over her joints.  Denies any new cough or hemoptysis.  Denies any recent infections like pneumonia.  She reported some ecchymosis on the left upper extremity.  No headaches or vision changes.    REVIEW OF SYSTEMS:  Review of Systems  Respiratory: Positive for shortness of breath.   Neurological: Positive for numbness.  Hematological: Bruises/bleeds easily.  All other systems reviewed and are negative.    PAST MEDICAL/SURGICAL HISTORY:  Past Medical History:  Diagnosis Date  . Acute respiratory failure with hypoxia ([place])   . Anxiety   . Cancer (HCC)    uvular per pt  . COPD (chronic obstructive pulmonary disease) ([place])   . DDD (degenerative disc disease), lumbar   . Degenerative joint disease of spine   . Diverticulosis   . GERD (gastroesophageal reflux disease)   . H. pylori infection    treated 02/2013.  [name] History of UTI   . Hypercholesterolemia   . Hypertension   . Hyponatremia   . Peripheral neuropathy   . Shortness of breath    with exertion  . Tobacco abuse    Past Surgical History:  Procedure Laterality Date  . ABDOMINAL EXPLORATION SURGERY     age 29, large ovarian cyst  . BIOPSY N/A 03/01/2013   HBZ:JIRC hiatal hernia; otherwise, normal examination/ Status post biopsies as described above. SB bx negative for Celiac. +h/pylori  . CATARACT EXTRACTION W/ INTRAOCULAR LENS  IMPLANT, BILATERAL    . COLONOSCOPY  10/16/09   Jenkins:3 polypsin the sigmoid colon/polyps in the descending colon and the rectum/scattered  diverticulum. hyperplastic  . COLONOSCOPY WITH ESOPHAGOGASTRODUODENOSCOPY (EGD) N/A 03/01/2013   VEL:FYBOFBPZ colonic polyps - treated/removed as described above. Colonic diverticulosis. Hyperplastic. Next TCS 02/2018.  Gastric biopsy showed H. pylori.  She completed treatment.  [name] DIRECT LARYNGOSCOPY N/A 06/08/2016   Procedure: DIRECT LARYNGOSCOPY AND BIOPSY;  Surgeon: Leta Baptist, MD;  Location: [facility] OR;  Service: ENT;  Laterality: N/A;  . PARTIAL HYSTERECTOMY     age 41  . UVULECTOMY       SOCIAL HISTORY:  Social History   Socioeconomic History  . Marital status: Divorced    Spouse name: Not on file  . Number of children: 4  . Years of education: Not on file  . Highest education level: Not on file  Occupational History  . Not on file  Social Needs  . Financial resource strain: Not on file  . Food insecurity    Worry: Not on file    Inability: Not on file  . Transportation needs    Medical: Not on file    Non-medical: Not on file  Tobacco Use  . Smoking status: Former Smoker    Packs/day: 1.50    Years: 50.00    Pack years: 75.00    Types: Cigarettes    Quit date: 07/16/2018    Years since quitting: 1.0  . Smokeless tobacco: Never Used  Substance and Sexual Activity  . Alcohol use: Not Currently    Comment: glass of  wine occasionally  . Drug use: No  . Sexual activity: Not on file  Lifestyle  . Physical activity    Days per week: Not on file    Minutes per session: Not on file  . Stress: Not on file  Relationships  . Social Herbalist on phone: Not on file    Gets together: Not on file    Attends religious service: Not on file    Active member of club or organization: Not on file    Attends meetings of clubs or organizations: Not on file    Relationship status: Not on file  . Intimate partner violence    Fear of current or ex partner: Not on file    Emotionally abused: Not on file    Physically abused: Not on file    Forced sexual activity: Not on file   Other Topics Concern  . Not on file  Social History Narrative  . Not on file    FAMILY HISTORY:  Family History  Problem Relation Age of Onset  . Leukemia Father   . Thyroid disease Father   . Stomach cancer Paternal Grandfather   . Colon cancer Neg Hx     CURRENT MEDICATIONS:  Outpatient Encounter Medications as of 08/20/2019  Medication Sig  . Neomycin-Bacitracin-Polymyxin (HCA TRIPLE ANTIBIOTIC OINTMENT EX) Apply topically daily. Apply topically to the inner left lower leg once daily and cover with bandaid  . povidone-iodine (BETADINE) 10 % ointment Apply 1 application topically daily. Apply to affected area daily  . acetaminophen (TYLENOL) 325 MG tablet Take 650 mg by mouth every 4 (four) hours as needed.   [name] albuterol (PROVENTIL, VENTOLIN) (5 MG/ML) 0.5% NEBU Take 1.25 mg by nebulization 3 (three) times daily.   [name] ALPRAZolam (XANAX) 0.5 MG tablet Take 1 tablet by mouth 2 (two) times daily.   [name] amLODipine (NORVASC) 5 MG tablet TAKE 1 TABLET BY MOUTH ONCE A DAY.  [name] atorvastatin (LIPITOR) 20 MG tablet Take 1 tablet by mouth daily.  . busPIRone (BUSPAR) 7.5 MG tablet Take 1 tablet by mouth 2 (two) times a day.  . Carboxymethylcellulose Sodium (ARTIFICIAL TEARS OP) Apply to eye as needed.  . Ergocalciferol (VITAMIN D2) 10 MCG (400 UNIT) TABS Take 1 tablet by mouth daily.  . fluticasone (FLONASE) 50 MCG/ACT nasal spray Place 1 spray into both nostrils daily.  . fluticasone furoate-vilanterol (BREO ELLIPTA) 100-25 MCG/INH AEPB Inhale 1 puff into the lungs daily.  [name] loratadine (CLARITIN) 10 MG tablet Take 10 mg by mouth daily.  . Pseudoephedrine-Guaifenesin (MUCINEX D MAX STRENGTH) [phone] MG TB12 Take by mouth as needed.  . traMADol (ULTRAM) 50 MG tablet Take 50 mg by mouth as needed.   . triamcinolone cream (KENALOG) 0.5 % Apply 1 application topically 2 (two) times daily. As needed  . [DISCONTINUED] Vitamin D, Ergocalciferol, (DRISDOL) 1.25 MG (50000 UT) CAPS capsule Take 50,000  Units by mouth every 7 (seven) days.   No facility-administered encounter medications on file as of 08/20/2019.     ALLERGIES:  Allergies  Allergen Reactions  . Mucinex [Guaifenesin Er]   . Prednisone Nausea Only  . Chantix [Varenicline] Other (See Comments)    Mental status changes  . Codeine Itching     PHYSICAL EXAM:  ECOG Performance status: 1  Vitals:   08/20/19 1140  BP: (!) 130/48  Resp: 14  Temp: 98.6 F (37 C)  SpO2: 100%   Filed Weights   08/20/19 1140  Weight: 136  lb 14.4 oz (62.1 kg)    Physical Exam Vitals signs reviewed.  Constitutional:      Appearance: Normal appearance.  HENT:     Mouth/Throat:     Mouth: Mucous membranes are moist.     Pharynx: Oropharynx is clear.  Cardiovascular:     Rate and Rhythm: Normal rate and regular rhythm.     Heart sounds: Normal heart sounds.  Pulmonary:     Breath sounds: Normal breath sounds.  Abdominal:     General: There is no distension.     Palpations: Abdomen is soft. There is no mass.  Musculoskeletal:        General: No swelling.  Lymphadenopathy:     Cervical: No cervical adenopathy.  Skin:    General: Skin is warm.  Neurological:     General: No focal deficit present.     Mental Status: She is alert and oriented to person, place, and time.  Psychiatric:        Mood and Affect: Mood normal.        Behavior: Behavior normal.      LABORATORY DATA:  I have reviewed the labs as listed.  CBC    Component Value Date/Time   WBC 10.4 08/16/2019 0917   RBC 3.39 (L) 08/16/2019 0917   HGB 9.8 (L) 08/16/2019 0917   HGB 10.9 01/21/2013   HCT 31.5 (L) 08/16/2019 0917   HCT 33 01/21/2013   PLT 347 08/16/2019 0917   PLT 305 01/14/2013   MCV 92.9 08/16/2019 0917   MCV 90.2 01/21/2013   MCH 28.9 08/16/2019 0917   MCHC 31.1 08/16/2019 0917   RDW 13.0 08/16/2019 0917   LYMPHSABS 2.0 08/16/2019 0917   MONOABS 0.6 08/16/2019 0917   EOSABS 0.1 08/16/2019 0917   BASOSABS 0.1 08/16/2019 0917   CMP  Latest Ref Rng & Units 08/16/2019 02/07/2019 11/23/2018  Glucose 70 - 99 mg/dL 112(H) 117(H) 127(H)  BUN 8 - 23 mg/dL 11 13 15   Creatinine 0.44 - 1.00 mg/dL 0.61 0.55 0.62  Sodium 135 - 145 mmol/L 136 138 136  Potassium 3.5 - 5.1 mmol/L 4.2 3.9 3.5  Chloride 98 - 111 mmol/L 98 96(L) 98  CO2 22 - 32 mmol/L 28 32 29  Calcium 8.9 - 10.3 mg/dL 9.8 9.9 9.7  Total Protein 6.5 - 8.1 g/dL 7.4 7.1 7.9  Total Bilirubin 0.3 - 1.2 mg/dL 0.4 0.5 0.5  Alkaline Phos 38 - 126 U/L 66 53 72  AST 15 - 41 U/L 17 16 17   ALT 0 - 44 U/L 15 14 12        DIAGNOSTIC IMAGING:  I have independently reviewed the scans and discussed with the patient.   I have reviewed Venita Lick LPN's note and agree with the documentation.  I personally performed a face-to-face visit, made revisions and my assessment and plan is as follows.    ASSESSMENT & PLAN:   Carcinoma of uvula ([place]) 1.  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 Nicolette Bang at [facility].  Pathology shows margins negative, P 16+. -She denies any dysphagia.  Physical exam did not reveal any masses in the soft palate or in the neck.  No oropharyngeal masses. -We will follow up on the PET scan at next visit.  2.  Right upper lobe pulmonary nodule: - This was found on previous scans and was also evaluated by Dr. Roxan Hockey.  She quit smoking in October 2019. -She denies any  new cough or hemoptysis.  Denies any recent infections. -CT of the chest with contrast from 02/13/2019 showed thick-walled cavitary nodule in the right upper lobe appears stable.  New area of airspace consolidation near the apex of the right upper lobe concerning for inflammation/infection.  There is a new 7 x 10 mm nodule in the right upper lobe which warrants close follow-up. -We reviewed results of CT of the chest with contrast from 08/16/2019.  New solid 1.4 x 1.1 cm left upper lobe nodule abutting the major fissure.  New 2.3 x 2.0 cm  airspace opacity in the right middle lobe with several other small new nodules.  Resolution of previously concerning nodule anteriorly in the right upper lobe and improved cavitary focus posteriorly in the right upper lobe.  Unchanged T7 compression fracture. -Because of these changes I have recommended a PET CT scan and follow-up after that.  3.  Anemia of chronic inflammation: - Last Feraheme on 10/25/2018 and 11/01/2018. -Hemoglobin remains around 9.8.  W38, folic acid and iron panel was normal.      Orders placed this encounter:  Orders Placed This Encounter  Procedures  . NM PET Image Initial (PI) Skull Base To Thigh      Derek Jack, MD [place] [phone]

## 2019-08-21 ENCOUNTER — Telehealth (HOSPITAL_COMMUNITY): Payer: Self-pay | Admitting: *Deleted

## 2019-08-21 NOTE — Telephone Encounter (Signed)
Staff member Sunday Spillers from [employer] [employer] facility wanting to know more information about pt's PET scan. Called Sunday Spillers back to let her know the PET scan was Friday December 4,2020 at 1300. Pt can't eat six hours before the test. Sunday Spillers verbalized understanding.

## 2019-08-29 DIAGNOSIS — U071 COVID-19: Secondary | ICD-10-CM | POA: Diagnosis not present

## 2019-08-30 ENCOUNTER — Ambulatory Visit (HOSPITAL_COMMUNITY)
Admission: RE | Admit: 2019-08-30 | Discharge: 2019-08-30 | Disposition: A | Discharge status: Home / Self Care | Payer: Medicare Other | Source: Ambulatory Visit | Attending: Hematology | Admitting: Hematology

## 2019-08-30 ENCOUNTER — Other Ambulatory Visit: Payer: Self-pay

## 2019-08-30 DIAGNOSIS — J439 Emphysema, unspecified: Secondary | ICD-10-CM | POA: Insufficient documentation

## 2019-08-30 DIAGNOSIS — X58XXXA Exposure to other specified factors, initial encounter: Secondary | ICD-10-CM | POA: Diagnosis not present

## 2019-08-30 DIAGNOSIS — K5730 Diverticulosis of large intestine without perforation or abscess without bleeding: Secondary | ICD-10-CM | POA: Diagnosis not present

## 2019-08-30 DIAGNOSIS — R911 Solitary pulmonary nodule: Secondary | ICD-10-CM

## 2019-08-30 DIAGNOSIS — I2510 Atherosclerotic heart disease of native coronary artery without angina pectoris: Secondary | ICD-10-CM | POA: Insufficient documentation

## 2019-08-30 DIAGNOSIS — D3502 Benign neoplasm of left adrenal gland: Secondary | ICD-10-CM | POA: Insufficient documentation

## 2019-08-30 DIAGNOSIS — S22060A Wedge compression fracture of T7-T8 vertebra, initial encounter for closed fracture: Secondary | ICD-10-CM | POA: Diagnosis not present

## 2019-08-30 DIAGNOSIS — Z85818 Personal history of malignant neoplasm of other sites of lip, oral cavity, and pharynx: Secondary | ICD-10-CM | POA: Insufficient documentation

## 2019-08-30 LAB — GLUCOSE, CAPILLARY: Glucose-Capillary: 103 mg/dL — ABNORMAL HIGH (ref 70–99)

## 2019-08-30 MED ORDER — FLUDEOXYGLUCOSE F - 18 (FDG) INJECTION
6.8000 | Freq: Once | INTRAVENOUS | Status: AC | PRN
  Administered 2019-08-30: 6.8 via INTRAVENOUS

## 2019-09-02 ENCOUNTER — Inpatient Hospital Stay (HOSPITAL_COMMUNITY): Payer: Medicare Other | Admitting: Hematology

## 2019-09-02 DIAGNOSIS — U071 COVID-19: Secondary | ICD-10-CM | POA: Diagnosis not present

## 2019-09-03 ENCOUNTER — Emergency Department (HOSPITAL_COMMUNITY): Payer: Medicare Other

## 2019-09-03 ENCOUNTER — Other Ambulatory Visit: Payer: Self-pay

## 2019-09-03 ENCOUNTER — Encounter (HOSPITAL_COMMUNITY): Payer: Self-pay | Admitting: Emergency Medicine

## 2019-09-03 ENCOUNTER — Emergency Department (HOSPITAL_COMMUNITY)
Admission: EM | Admit: 2019-09-03 | Discharge: 2019-09-03 | Disposition: A | Discharge status: Skilled Nursing Facility | Payer: Medicare Other | Attending: Emergency Medicine | Admitting: Emergency Medicine

## 2019-09-03 DIAGNOSIS — R05 Cough: Secondary | ICD-10-CM | POA: Diagnosis not present

## 2019-09-03 DIAGNOSIS — Z79899 Other long term (current) drug therapy: Secondary | ICD-10-CM | POA: Diagnosis not present

## 2019-09-03 DIAGNOSIS — R509 Fever, unspecified: Secondary | ICD-10-CM | POA: Diagnosis present

## 2019-09-03 DIAGNOSIS — J449 Chronic obstructive pulmonary disease, unspecified: Secondary | ICD-10-CM | POA: Insufficient documentation

## 2019-09-03 DIAGNOSIS — I10 Essential (primary) hypertension: Secondary | ICD-10-CM | POA: Diagnosis not present

## 2019-09-03 DIAGNOSIS — Z800 Family history of malignant neoplasm of digestive organs: Secondary | ICD-10-CM | POA: Insufficient documentation

## 2019-09-03 DIAGNOSIS — Z743 Need for continuous supervision: Secondary | ICD-10-CM | POA: Diagnosis not present

## 2019-09-03 DIAGNOSIS — R069 Unspecified abnormalities of breathing: Secondary | ICD-10-CM | POA: Diagnosis not present

## 2019-09-03 DIAGNOSIS — R531 Weakness: Secondary | ICD-10-CM | POA: Diagnosis not present

## 2019-09-03 DIAGNOSIS — U071 COVID-19: Secondary | ICD-10-CM | POA: Diagnosis not present

## 2019-09-03 LAB — CBC WITH DIFFERENTIAL/PLATELET
Abs Immature Granulocytes: 0.02 10*3/uL (ref 0.00–0.07)
Basophils Absolute: 0 10*3/uL (ref 0.0–0.1)
Basophils Relative: 0 %
Eosinophils Absolute: 0 10*3/uL (ref 0.0–0.5)
Eosinophils Relative: 0 %
HCT: 32.4 % — ABNORMAL LOW (ref 36.0–46.0)
Hemoglobin: 10 g/dL — ABNORMAL LOW (ref 12.0–15.0)
Immature Granulocytes: 0 %
Lymphocytes Relative: 16 %
Lymphs Abs: 0.8 10*3/uL (ref 0.7–4.0)
MCH: 29.2 pg (ref 26.0–34.0)
MCHC: 30.9 g/dL (ref 30.0–36.0)
MCV: 94.5 fL (ref 80.0–100.0)
Monocytes Absolute: 0.4 10*3/uL (ref 0.1–1.0)
Monocytes Relative: 8 %
Neutro Abs: 3.8 10*3/uL (ref 1.7–7.7)
Neutrophils Relative %: 76 %
Platelets: 174 10*3/uL (ref 150–400)
RBC: 3.43 MIL/uL — ABNORMAL LOW (ref 3.87–5.11)
RDW: 13.5 % (ref 11.5–15.5)
WBC: 5 10*3/uL (ref 4.0–10.5)
nRBC: 0 % (ref 0.0–0.2)

## 2019-09-03 LAB — COMPREHENSIVE METABOLIC PANEL
ALT: 16 U/L (ref 0–44)
AST: 19 U/L (ref 15–41)
Albumin: 4.1 g/dL (ref 3.5–5.0)
Alkaline Phosphatase: 59 U/L (ref 38–126)
Anion gap: 13 (ref 5–15)
BUN: 9 mg/dL (ref 8–23)
CO2: 27 mmol/L (ref 22–32)
Calcium: 9.2 mg/dL (ref 8.9–10.3)
Chloride: 95 mmol/L — ABNORMAL LOW (ref 98–111)
Creatinine, Ser: 0.64 mg/dL (ref 0.44–1.00)
GFR calc Af Amer: >60 mL/min (ref >60)
GFR calc non Af Amer: >60 mL/min (ref >60)
Glucose, Bld: 113 mg/dL — ABNORMAL HIGH (ref 70–99)
Potassium: 4 mmol/L (ref 3.5–5.1)
Sodium: 135 mmol/L (ref 135–145)
Total Bilirubin: 0.3 mg/dL (ref 0.3–1.2)
Total Protein: 7.2 g/dL (ref 6.5–8.1)

## 2019-09-03 LAB — POC SARS CORONAVIRUS 2 AG -  ED: SARS Coronavirus 2 Ag: POSITIVE — AB

## 2019-09-03 MED ORDER — ALBUTEROL SULFATE HFA 108 (90 BASE) MCG/ACT IN AERS: 2.0000 | INHALATION_SPRAY | Freq: Four times a day (QID) | RESPIRATORY_TRACT | Status: DC | PRN

## 2019-09-03 MED ORDER — ALBUTEROL SULFATE HFA 108 (90 BASE) MCG/ACT IN AERS: 1.0000 | INHALATION_SPRAY | Freq: Four times a day (QID) | RESPIRATORY_TRACT | 0 refills | Status: DC | PRN

## 2019-09-03 NOTE — Discharge Instructions (Signed)
Take Tylenol for fever drink plenty of fluids and use albuterol inhaler as needed for shortness of breath.  Follow-up with your doctor later this week for recheck

## 2019-09-03 NOTE — ED Triage Notes (Signed)
Pt c/o fever and cough since Saturday. Pt is from [place] and is on continuous oxygen at 3lpm

## 2019-09-05 DIAGNOSIS — U071 COVID-19: Secondary | ICD-10-CM | POA: Diagnosis not present

## 2019-09-05 DIAGNOSIS — J449 Chronic obstructive pulmonary disease, unspecified: Secondary | ICD-10-CM | POA: Diagnosis not present

## 2019-09-06 NOTE — ED Provider Notes (Signed)
[facility] EMERGENCY DEPARTMENT Provider Note   CSN: [id number] Arrival date & time: 09/03/19  1930     History Chief Complaint  Patient presents with  . Fever    Lindsey Combs is a 73 y.o. female.  Patient has fever and cough.  She is usually on 3 L oxygen at home.  The history is provided by the nursing home. No language interpreter was used.  Fever Max temp prior to arrival:  100.4 Temp source:  Oral Severity:  Mild Onset quality:  Sudden Timing:  Constant Progression:  Worsening Chronicity:  New Relieved by:  Nothing Worsened by:  Nothing Ineffective treatments:  None tried Associated symptoms: cough   Associated symptoms: no chest pain, no congestion, no diarrhea, no headaches and no rash        Past Medical History:  Diagnosis Date  . Acute respiratory failure with hypoxia ([place])   . Anxiety   . Cancer (HCC)    uvular per pt  . COPD (chronic obstructive pulmonary disease) ([place])   . DDD (degenerative disc disease), lumbar   . Degenerative joint disease of spine   . Diverticulosis   . GERD (gastroesophageal reflux disease)   . H. pylori infection    treated 02/2013.  [name] History of UTI   . Hypercholesterolemia   . Hypertension   . Hyponatremia   . Peripheral neuropathy   . Shortness of breath    with exertion  . Tobacco abuse     Patient Active Problem List   Diagnosis Date Noted  . PAD (peripheral artery disease) ([place]) 08/13/2019  . H/O colonoscopy with polypectomy 04/25/2019  . Acute on chronic respiratory failure with hypoxia ([place]) 05/04/2018  . Severe protein-calorie malnutrition ([place]) 05/04/2018  . Closed right hip fracture ([place]) greater Trochanter 07/13/17 07/13/2017  . Vomiting and diarrhea 07/13/2017  . Fall at home, initial encounter 07/13/2017  . Hip fracture ([place]) 07/13/2017  . COPD with acute exacerbation ([place]) 02/16/2017  . Tobacco abuse 02/16/2017  . Malnutrition of moderate degree 09/22/2016  . Hypomagnesemia 09/21/2016  .  Tobacco use 08/03/2016  . Hyperlipidemia 08/03/2016  . Hypertension 08/03/2016  . History of Helicobacter pylori infection 08/03/2016  . History of anemia 08/03/2016  . Diverticulosis 08/03/2016  . Arthritis 08/03/2016  . 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 Chronic obstructive pulmonary disease with acute exacerbation ([place])   . Acute respiratory failure with hypoxia ([place]) 04/03/2015  . COPD exacerbation ([place]) 04/03/2015  . Hyponatremia 04/03/2015  . Dehydration 04/03/2015  . Hyperglycemia 04/03/2015  . Anxiety   . COPD (chronic obstructive pulmonary disease) ([place])   . Peripheral neuropathy ([place])   . Tobacco dependence   . Back pain, chronic 04/19/2013  . Abnormal weight loss 02/19/2013  . Anemia, iron deficiency 02/19/2013  . GERD (gastroesophageal reflux disease) 02/19/2013    Past Surgical History:  Procedure Laterality Date  . ABDOMINAL EXPLORATION SURGERY     age 37, large ovarian cyst  . BIOPSY N/A 03/01/2013   DPO:EUMP hiatal hernia; otherwise, normal examination/ Status post biopsies as described above. SB bx negative for Celiac. +h/pylori  . CATARACT EXTRACTION W/ INTRAOCULAR LENS  IMPLANT, BILATERAL    . COLONOSCOPY  10/16/09   Jenkins:3 polypsin the sigmoid colon/polyps in the descending colon and the rectum/scattered diverticulum. hyperplastic  . COLONOSCOPY WITH ESOPHAGOGASTRODUODENOSCOPY (EGD) N/A 03/01/2013   NTI:[id number] colonic polyps - treated/removed as described above. Colonic diverticulosis. Hyperplastic. Next TCS 02/2018.  Gastric biopsy showed H. pylori.  She completed treatment.  [name]  DIRECT LARYNGOSCOPY N/A 06/08/2016   Procedure: DIRECT LARYNGOSCOPY AND BIOPSY;  Surgeon: Leta Baptist, MD;  Location: [facility] OR;  Service: ENT;  Laterality: N/A;  . PARTIAL HYSTERECTOMY     age 40  . UVULECTOMY       OB History    Gravida  5   Para  4   Term  4   Preterm      AB  1   Living        SAB  1   TAB      Ectopic      Multiple      Live Births                Family History  Problem Relation Age of Onset  . Leukemia Father   . Thyroid disease Father   . Stomach cancer Paternal Grandfather   . Colon cancer Neg Hx     Social History   Tobacco Use  . Smoking status: Former Smoker    Packs/day: 1.50    Years: 50.00    Pack years: 75.00    Types: Cigarettes    Quit date: 07/16/2018    Years since quitting: 1.1  . Smokeless tobacco: Never Used  Substance Use Topics  . Alcohol use: Not Currently    Comment: glass of wine occasionally  . Drug use: No    Home Medications Prior to Admission medications   Medication Sig Start Date End Date Taking? Authorizing Provider  acetaminophen (TYLENOL) 325 MG tablet Take 650 mg by mouth every 4 (four) hours as needed.     [provider]  albuterol (PROVENTIL, VENTOLIN) (5 MG/ML) 0.5% NEBU Take 1.25 mg by nebulization 3 (three) times daily.     [provider]  albuterol (VENTOLIN HFA) 108 (90 Base) MCG/ACT inhaler Inhale 1-2 puffs into the lungs every 6 (six) hours as needed for wheezing or shortness of breath. 09/03/19   Milton Ferguson, MD  ALPRAZolam Duanne Moron) 0.5 MG tablet Take 1 tablet by mouth 2 (two) times daily.  10/19/18   [provider]  amLODipine (NORVASC) 5 MG tablet TAKE 1 TABLET BY MOUTH ONCE A DAY. 01/16/17   Baird Cancer, PA-C  atorvastatin (LIPITOR) 20 MG tablet Take 1 tablet by mouth daily. 04/16/19   [provider]  busPIRone (BUSPAR) 7.5 MG tablet Take 1 tablet by mouth 2 (two) times a day. 04/12/19   [provider]  Carboxymethylcellulose Sodium (ARTIFICIAL TEARS OP) Apply to eye as needed.    [provider]  Ergocalciferol (VITAMIN D2) 10 MCG (400 UNIT) TABS Take 1 tablet by mouth daily.    [provider]  fluticasone (FLONASE) 50 MCG/ACT nasal spray Place 1 spray into both nostrils daily.    [provider]  fluticasone furoate-vilanterol (BREO ELLIPTA) 100-25 MCG/INH AEPB Inhale 1 puff into the  lungs daily.    [provider]  loratadine (CLARITIN) 10 MG tablet Take 10 mg by mouth daily.    [provider]  Neomycin-Bacitracin-Polymyxin (HCA TRIPLE ANTIBIOTIC OINTMENT EX) Apply topically daily. Apply topically to the inner left lower leg once daily and cover with bandaid    [provider]  povidone-iodine (BETADINE) 10 % ointment Apply 1 application topically daily. Apply to affected area daily    [provider]  Pseudoephedrine-Guaifenesin (MUCINEX D MAX STRENGTH) [phone] MG TB12 Take by mouth as needed.    [provider]  traMADol (ULTRAM) 50 MG tablet Take 50 mg by mouth  as needed.  08/17/19   [provider]  triamcinolone cream (KENALOG) 0.5 % Apply 1 application topically 2 (two) times daily. As needed    [provider]    Allergies    Mucinex [guaifenesin er], Prednisone, Chantix [varenicline], and Codeine  Review of Systems   Review of Systems  Constitutional: Positive for fever. Negative for appetite change and fatigue.  HENT: Negative for congestion, ear discharge and sinus pressure.   Eyes: Negative for discharge.  Respiratory: Positive for cough.   Cardiovascular: Negative for chest pain.  Gastrointestinal: Negative for abdominal pain and diarrhea.  Genitourinary: Negative for frequency and hematuria.  Musculoskeletal: Negative for back pain.  Skin: Negative for rash.  Neurological: Negative for seizures and headaches.  Psychiatric/Behavioral: Negative for hallucinations.    Physical Exam Updated Vital Signs BP (!) 152/59   Pulse 93   Temp (!) 100.4 F (38 C) (Oral)   Resp 18   Ht 5\' 5"  (1.651 m)   Wt 61.7 kg   SpO2 97%   BMI 22.63 kg/m   Physical Exam Vitals and nursing note reviewed.  Constitutional:      Appearance: She is well-developed.  HENT:     Head: Normocephalic.     Nose: Nose normal.  Eyes:     General: No scleral icterus.    Conjunctiva/sclera: Conjunctivae normal.    Neck:     Thyroid: No thyromegaly.  Cardiovascular:     Rate and Rhythm: Normal rate and regular rhythm.     Heart sounds: No murmur. No friction rub. No gallop.   Pulmonary:     Breath sounds: No stridor. No wheezing or rales.  Chest:     Chest wall: No tenderness.  Abdominal:     General: There is no distension.     Tenderness: There is no abdominal tenderness. There is no rebound.  Musculoskeletal:        General: Normal range of motion.     Cervical back: Neck supple.  Lymphadenopathy:     Cervical: No cervical adenopathy.  Skin:    Findings: No erythema or rash.  Neurological:     Mental Status: She is oriented to person, place, and time.     Motor: No abnormal muscle tone.     Coordination: Coordination normal.  Psychiatric:        Behavior: Behavior normal.     ED Results / Procedures / Treatments   Labs (all labs ordered are listed, but only abnormal results are displayed) Labs Reviewed  CBC WITH DIFFERENTIAL/PLATELET - Abnormal; Notable for the following components:      Result Value   RBC 3.43 (*)    Hemoglobin 10.0 (*)    HCT 32.4 (*)    All other components within normal limits  COMPREHENSIVE METABOLIC PANEL - Abnormal; Notable for the following components:   Chloride 95 (*)    Glucose, Bld 113 (*)    All other components within normal limits  POC SARS CORONAVIRUS 2 AG -  ED - Abnormal; Notable for the following components:   SARS Coronavirus 2 Ag POSITIVE (*)    All other components within normal limits    EKG None  Radiology No results found.  Procedures Procedures (including critical care time)  Medications Ordered in ED Medications - No data to display  ED Course  I have reviewed the triage vital signs and the nursing notes.  Pertinent labs & imaging results that were available during my care of the patient were  reviewed by me and considered in my medical decision making (see chart for details).    BLIMA JAIMES was evaluated in  Emergency Department on 09/06/2019 for the symptoms described in the history of present illness. She was evaluated in the context of the global COVID-19 pandemic, which necessitated consideration that the patient might be at risk for infection with the SARS-CoV-2 virus that causes COVID-19. Institutional protocols and algorithms that pertain to the evaluation of patients at risk for COVID-19 are in a state of rapid change based on information released by regulatory bodies including the CDC and federal and state organizations. These policies and algorithms were followed during the patient's care in the ED.  MDM Rules/Calculators/A&P     CHA2DS2/VAS Stroke Risk Points      N/A >= 2 Points: High Risk  1 - 1.99 Points: Medium Risk  0 Points: Low Risk    A final score could not be computed because of missing components.: Last  Change: N/A     This score determines the patient's risk of having a stroke if the  patient has atrial fibrillation.      This score is not applicable to this patient. Components are not  calculated.                   Patient positive with Covid.  Patient nontoxic from Covid.  She will be discharged back to the nursing home and given albuterol Final Clinical Impression(s) / ED Diagnoses Final diagnoses:  COVID-19    Rx / DC Orders ED Discharge Orders         Ordered    albuterol (VENTOLIN HFA) 108 (90 Base) MCG/ACT inhaler  Every 6 hours PRN     09/03/19 2322           Milton Ferguson, MD 09/06/19 1013

## 2019-09-07 DIAGNOSIS — J441 Chronic obstructive pulmonary disease with (acute) exacerbation: Secondary | ICD-10-CM | POA: Diagnosis not present

## 2019-09-07 DIAGNOSIS — J9801 Acute bronchospasm: Secondary | ICD-10-CM | POA: Diagnosis not present

## 2019-09-09 DIAGNOSIS — J449 Chronic obstructive pulmonary disease, unspecified: Secondary | ICD-10-CM | POA: Diagnosis not present

## 2019-09-09 DIAGNOSIS — U071 COVID-19: Secondary | ICD-10-CM | POA: Diagnosis not present

## 2019-09-11 DIAGNOSIS — R278 Other lack of coordination: Secondary | ICD-10-CM | POA: Diagnosis not present

## 2019-09-11 DIAGNOSIS — M6281 Muscle weakness (generalized): Secondary | ICD-10-CM | POA: Diagnosis not present

## 2019-09-12 ENCOUNTER — Ambulatory Visit (HOSPITAL_COMMUNITY): Payer: Medicare Other | Admitting: Hematology

## 2019-09-12 DIAGNOSIS — M6281 Muscle weakness (generalized): Secondary | ICD-10-CM | POA: Diagnosis not present

## 2019-09-12 DIAGNOSIS — J449 Chronic obstructive pulmonary disease, unspecified: Secondary | ICD-10-CM | POA: Diagnosis not present

## 2019-09-12 DIAGNOSIS — U071 COVID-19: Secondary | ICD-10-CM | POA: Diagnosis not present

## 2019-09-12 DIAGNOSIS — R278 Other lack of coordination: Secondary | ICD-10-CM | POA: Diagnosis not present

## 2019-09-13 DIAGNOSIS — R278 Other lack of coordination: Secondary | ICD-10-CM | POA: Diagnosis not present

## 2019-09-13 DIAGNOSIS — M6281 Muscle weakness (generalized): Secondary | ICD-10-CM | POA: Diagnosis not present

## 2019-09-15 DIAGNOSIS — R278 Other lack of coordination: Secondary | ICD-10-CM | POA: Diagnosis not present

## 2019-09-15 DIAGNOSIS — M6281 Muscle weakness (generalized): Secondary | ICD-10-CM | POA: Diagnosis not present

## 2019-09-16 DIAGNOSIS — J449 Chronic obstructive pulmonary disease, unspecified: Secondary | ICD-10-CM | POA: Diagnosis not present

## 2019-09-16 DIAGNOSIS — U071 COVID-19: Secondary | ICD-10-CM | POA: Diagnosis not present

## 2019-09-16 DIAGNOSIS — J9611 Chronic respiratory failure with hypoxia: Secondary | ICD-10-CM | POA: Diagnosis not present

## 2019-09-16 DIAGNOSIS — Z9981 Dependence on supplemental oxygen: Secondary | ICD-10-CM | POA: Diagnosis not present

## 2019-09-17 DIAGNOSIS — M6281 Muscle weakness (generalized): Secondary | ICD-10-CM | POA: Diagnosis not present

## 2019-09-17 DIAGNOSIS — R278 Other lack of coordination: Secondary | ICD-10-CM | POA: Diagnosis not present

## 2019-09-17 DIAGNOSIS — J449 Chronic obstructive pulmonary disease, unspecified: Secondary | ICD-10-CM | POA: Diagnosis not present

## 2019-09-22 DIAGNOSIS — R278 Other lack of coordination: Secondary | ICD-10-CM | POA: Diagnosis not present

## 2019-09-22 DIAGNOSIS — M6281 Muscle weakness (generalized): Secondary | ICD-10-CM | POA: Diagnosis not present

## 2019-09-24 DIAGNOSIS — M6281 Muscle weakness (generalized): Secondary | ICD-10-CM | POA: Diagnosis not present

## 2019-09-24 DIAGNOSIS — R278 Other lack of coordination: Secondary | ICD-10-CM | POA: Diagnosis not present

## 2019-10-01 ENCOUNTER — Other Ambulatory Visit (HOSPITAL_COMMUNITY): Payer: Self-pay | Admitting: Family Medicine

## 2019-10-01 DIAGNOSIS — R278 Other lack of coordination: Secondary | ICD-10-CM | POA: Diagnosis not present

## 2019-10-01 DIAGNOSIS — M6281 Muscle weakness (generalized): Secondary | ICD-10-CM | POA: Diagnosis not present

## 2019-10-01 DIAGNOSIS — Z1231 Encounter for screening mammogram for malignant neoplasm of breast: Secondary | ICD-10-CM

## 2019-10-02 DIAGNOSIS — M6281 Muscle weakness (generalized): Secondary | ICD-10-CM | POA: Diagnosis not present

## 2019-10-02 DIAGNOSIS — R278 Other lack of coordination: Secondary | ICD-10-CM | POA: Diagnosis not present

## 2019-10-03 DIAGNOSIS — J449 Chronic obstructive pulmonary disease, unspecified: Secondary | ICD-10-CM | POA: Diagnosis not present

## 2019-10-03 DIAGNOSIS — M6281 Muscle weakness (generalized): Secondary | ICD-10-CM | POA: Diagnosis not present

## 2019-10-03 DIAGNOSIS — U071 COVID-19: Secondary | ICD-10-CM | POA: Diagnosis not present

## 2019-10-03 DIAGNOSIS — R278 Other lack of coordination: Secondary | ICD-10-CM | POA: Diagnosis not present

## 2019-10-07 DIAGNOSIS — U071 COVID-19: Secondary | ICD-10-CM | POA: Diagnosis not present

## 2019-10-07 DIAGNOSIS — J449 Chronic obstructive pulmonary disease, unspecified: Secondary | ICD-10-CM | POA: Diagnosis not present

## 2019-10-08 DIAGNOSIS — J9801 Acute bronchospasm: Secondary | ICD-10-CM | POA: Diagnosis not present

## 2019-10-08 DIAGNOSIS — J441 Chronic obstructive pulmonary disease with (acute) exacerbation: Secondary | ICD-10-CM | POA: Diagnosis not present

## 2019-10-09 DIAGNOSIS — J449 Chronic obstructive pulmonary disease, unspecified: Secondary | ICD-10-CM | POA: Diagnosis not present

## 2019-10-09 DIAGNOSIS — U071 COVID-19: Secondary | ICD-10-CM | POA: Diagnosis not present

## 2019-10-10 DIAGNOSIS — J449 Chronic obstructive pulmonary disease, unspecified: Secondary | ICD-10-CM | POA: Diagnosis not present

## 2019-10-10 DIAGNOSIS — R278 Other lack of coordination: Secondary | ICD-10-CM | POA: Diagnosis not present

## 2019-10-10 DIAGNOSIS — M6281 Muscle weakness (generalized): Secondary | ICD-10-CM | POA: Diagnosis not present

## 2019-10-10 DIAGNOSIS — U071 COVID-19: Secondary | ICD-10-CM | POA: Diagnosis not present

## 2019-10-11 DIAGNOSIS — M6281 Muscle weakness (generalized): Secondary | ICD-10-CM | POA: Diagnosis not present

## 2019-10-11 DIAGNOSIS — R278 Other lack of coordination: Secondary | ICD-10-CM | POA: Diagnosis not present

## 2019-10-13 DIAGNOSIS — J449 Chronic obstructive pulmonary disease, unspecified: Secondary | ICD-10-CM | POA: Diagnosis not present

## 2019-10-13 DIAGNOSIS — U071 COVID-19: Secondary | ICD-10-CM | POA: Diagnosis not present

## 2019-10-15 DIAGNOSIS — Z9981 Dependence on supplemental oxygen: Secondary | ICD-10-CM | POA: Diagnosis not present

## 2019-10-15 DIAGNOSIS — J449 Chronic obstructive pulmonary disease, unspecified: Secondary | ICD-10-CM | POA: Diagnosis not present

## 2019-10-15 DIAGNOSIS — J9611 Chronic respiratory failure with hypoxia: Secondary | ICD-10-CM | POA: Diagnosis not present

## 2019-10-15 DIAGNOSIS — M6281 Muscle weakness (generalized): Secondary | ICD-10-CM | POA: Diagnosis not present

## 2019-10-15 DIAGNOSIS — R278 Other lack of coordination: Secondary | ICD-10-CM | POA: Diagnosis not present

## 2019-10-15 DIAGNOSIS — G4700 Insomnia, unspecified: Secondary | ICD-10-CM | POA: Diagnosis not present

## 2019-10-16 DIAGNOSIS — H353131 Nonexudative age-related macular degeneration, bilateral, early dry stage: Secondary | ICD-10-CM | POA: Diagnosis not present

## 2019-10-17 DIAGNOSIS — J449 Chronic obstructive pulmonary disease, unspecified: Secondary | ICD-10-CM | POA: Diagnosis not present

## 2019-11-04 ENCOUNTER — Ambulatory Visit (HOSPITAL_COMMUNITY): Payer: Medicare Other

## 2019-11-08 DIAGNOSIS — J441 Chronic obstructive pulmonary disease with (acute) exacerbation: Secondary | ICD-10-CM | POA: Diagnosis not present

## 2019-11-08 DIAGNOSIS — J9801 Acute bronchospasm: Secondary | ICD-10-CM | POA: Diagnosis not present

## 2019-11-18 DIAGNOSIS — J449 Chronic obstructive pulmonary disease, unspecified: Secondary | ICD-10-CM | POA: Diagnosis not present

## 2019-11-21 DIAGNOSIS — Z79891 Long term (current) use of opiate analgesic: Secondary | ICD-10-CM | POA: Diagnosis not present

## 2019-11-22 DIAGNOSIS — Z79899 Other long term (current) drug therapy: Secondary | ICD-10-CM | POA: Diagnosis not present

## 2019-11-22 DIAGNOSIS — I10 Essential (primary) hypertension: Secondary | ICD-10-CM | POA: Diagnosis not present

## 2019-11-22 DIAGNOSIS — G894 Chronic pain syndrome: Secondary | ICD-10-CM | POA: Diagnosis not present

## 2019-11-22 DIAGNOSIS — E785 Hyperlipidemia, unspecified: Secondary | ICD-10-CM | POA: Diagnosis not present

## 2019-11-26 DIAGNOSIS — J449 Chronic obstructive pulmonary disease, unspecified: Secondary | ICD-10-CM | POA: Diagnosis not present

## 2019-11-26 DIAGNOSIS — G894 Chronic pain syndrome: Secondary | ICD-10-CM | POA: Diagnosis not present

## 2019-11-26 DIAGNOSIS — E785 Hyperlipidemia, unspecified: Secondary | ICD-10-CM | POA: Diagnosis not present

## 2019-11-26 DIAGNOSIS — M545 Low back pain: Secondary | ICD-10-CM | POA: Diagnosis not present

## 2019-12-06 DIAGNOSIS — J9801 Acute bronchospasm: Secondary | ICD-10-CM | POA: Diagnosis not present

## 2019-12-06 DIAGNOSIS — J441 Chronic obstructive pulmonary disease with (acute) exacerbation: Secondary | ICD-10-CM | POA: Diagnosis not present

## 2019-12-19 DIAGNOSIS — J449 Chronic obstructive pulmonary disease, unspecified: Secondary | ICD-10-CM | POA: Diagnosis not present

## 2019-12-24 DIAGNOSIS — Z5181 Encounter for therapeutic drug level monitoring: Secondary | ICD-10-CM | POA: Diagnosis not present

## 2019-12-24 DIAGNOSIS — U071 COVID-19: Secondary | ICD-10-CM | POA: Diagnosis not present

## 2019-12-30 ENCOUNTER — Ambulatory Visit (HOSPITAL_COMMUNITY)
Admission: RE | Admit: 2019-12-30 | Discharge: 2019-12-30 | Disposition: A | Discharge status: Home / Self Care | Payer: Medicare Other | Source: Ambulatory Visit | Attending: Family Medicine | Admitting: Family Medicine

## 2019-12-30 ENCOUNTER — Other Ambulatory Visit: Payer: Self-pay

## 2019-12-30 DIAGNOSIS — Z1231 Encounter for screening mammogram for malignant neoplasm of breast: Secondary | ICD-10-CM | POA: Diagnosis not present

## 2020-01-03 DIAGNOSIS — W182XXA Fall in (into) shower or empty bathtub, initial encounter: Secondary | ICD-10-CM | POA: Diagnosis not present

## 2020-01-03 DIAGNOSIS — S79911A Unspecified injury of right hip, initial encounter: Secondary | ICD-10-CM | POA: Diagnosis not present

## 2020-01-03 DIAGNOSIS — S81811A Laceration without foreign body, right lower leg, initial encounter: Secondary | ICD-10-CM | POA: Diagnosis not present

## 2020-01-03 DIAGNOSIS — S199XXA Unspecified injury of neck, initial encounter: Secondary | ICD-10-CM | POA: Diagnosis not present

## 2020-01-03 DIAGNOSIS — R52 Pain, unspecified: Secondary | ICD-10-CM | POA: Diagnosis not present

## 2020-01-03 DIAGNOSIS — G8911 Acute pain due to trauma: Secondary | ICD-10-CM | POA: Diagnosis not present

## 2020-01-03 DIAGNOSIS — R42 Dizziness and giddiness: Secondary | ICD-10-CM | POA: Diagnosis not present

## 2020-01-03 DIAGNOSIS — Z888 Allergy status to other drugs, medicaments and biological substances status: Secondary | ICD-10-CM | POA: Diagnosis not present

## 2020-01-03 DIAGNOSIS — S0083XA Contusion of other part of head, initial encounter: Secondary | ICD-10-CM | POA: Diagnosis not present

## 2020-01-03 DIAGNOSIS — J449 Chronic obstructive pulmonary disease, unspecified: Secondary | ICD-10-CM | POA: Diagnosis not present

## 2020-01-03 DIAGNOSIS — R519 Headache, unspecified: Secondary | ICD-10-CM | POA: Diagnosis not present

## 2020-01-03 DIAGNOSIS — Z885 Allergy status to narcotic agent status: Secondary | ICD-10-CM | POA: Diagnosis not present

## 2020-01-03 DIAGNOSIS — H7092 Unspecified mastoiditis, left ear: Secondary | ICD-10-CM | POA: Diagnosis not present

## 2020-01-03 DIAGNOSIS — M25551 Pain in right hip: Secondary | ICD-10-CM | POA: Diagnosis not present

## 2020-01-03 DIAGNOSIS — I10 Essential (primary) hypertension: Secondary | ICD-10-CM | POA: Diagnosis not present

## 2020-01-06 DIAGNOSIS — J9801 Acute bronchospasm: Secondary | ICD-10-CM | POA: Diagnosis not present

## 2020-01-06 DIAGNOSIS — U071 COVID-19: Secondary | ICD-10-CM | POA: Diagnosis not present

## 2020-01-06 DIAGNOSIS — J441 Chronic obstructive pulmonary disease with (acute) exacerbation: Secondary | ICD-10-CM | POA: Diagnosis not present

## 2020-01-13 DIAGNOSIS — U071 COVID-19: Secondary | ICD-10-CM | POA: Diagnosis not present

## 2020-01-14 DIAGNOSIS — U071 COVID-19: Secondary | ICD-10-CM | POA: Diagnosis not present

## 2020-02-05 DIAGNOSIS — J9801 Acute bronchospasm: Secondary | ICD-10-CM | POA: Diagnosis not present

## 2020-02-05 DIAGNOSIS — J441 Chronic obstructive pulmonary disease with (acute) exacerbation: Secondary | ICD-10-CM | POA: Diagnosis not present

## 2020-02-17 DIAGNOSIS — J449 Chronic obstructive pulmonary disease, unspecified: Secondary | ICD-10-CM | POA: Diagnosis not present

## 2020-02-27 DIAGNOSIS — G894 Chronic pain syndrome: Secondary | ICD-10-CM | POA: Diagnosis not present

## 2020-02-27 DIAGNOSIS — E785 Hyperlipidemia, unspecified: Secondary | ICD-10-CM | POA: Diagnosis not present

## 2020-02-27 DIAGNOSIS — Z0000 Encounter for general adult medical examination without abnormal findings: Secondary | ICD-10-CM | POA: Diagnosis not present

## 2020-02-27 DIAGNOSIS — J449 Chronic obstructive pulmonary disease, unspecified: Secondary | ICD-10-CM | POA: Diagnosis not present

## 2020-03-09 ENCOUNTER — Encounter (HOSPITAL_COMMUNITY): Payer: Self-pay | Admitting: Hematology

## 2020-03-09 ENCOUNTER — Other Ambulatory Visit: Payer: Self-pay

## 2020-03-09 ENCOUNTER — Inpatient Hospital Stay (HOSPITAL_COMMUNITY): Payer: Medicare Other | Attending: Hematology | Admitting: Hematology

## 2020-03-09 ENCOUNTER — Inpatient Hospital Stay (HOSPITAL_COMMUNITY): Payer: Medicare Other

## 2020-03-09 VITALS — BP 128/60 | HR 90 | Temp 97.5°F | Resp 19 | Wt 143.9 lb

## 2020-03-09 DIAGNOSIS — Z87891 Personal history of nicotine dependence: Secondary | ICD-10-CM | POA: Diagnosis not present

## 2020-03-09 DIAGNOSIS — I10 Essential (primary) hypertension: Secondary | ICD-10-CM | POA: Insufficient documentation

## 2020-03-09 DIAGNOSIS — M2550 Pain in unspecified joint: Secondary | ICD-10-CM | POA: Insufficient documentation

## 2020-03-09 DIAGNOSIS — F17210 Nicotine dependence, cigarettes, uncomplicated: Secondary | ICD-10-CM | POA: Diagnosis not present

## 2020-03-09 DIAGNOSIS — K219 Gastro-esophageal reflux disease without esophagitis: Secondary | ICD-10-CM | POA: Insufficient documentation

## 2020-03-09 DIAGNOSIS — E7800 Pure hypercholesterolemia, unspecified: Secondary | ICD-10-CM | POA: Diagnosis not present

## 2020-03-09 DIAGNOSIS — Z79899 Other long term (current) drug therapy: Secondary | ICD-10-CM | POA: Insufficient documentation

## 2020-03-09 DIAGNOSIS — D6489 Other specified anemias: Secondary | ICD-10-CM | POA: Insufficient documentation

## 2020-03-09 DIAGNOSIS — R5383 Other fatigue: Secondary | ICD-10-CM | POA: Diagnosis not present

## 2020-03-09 DIAGNOSIS — D509 Iron deficiency anemia, unspecified: Secondary | ICD-10-CM

## 2020-03-09 DIAGNOSIS — M5136 Other intervertebral disc degeneration, lumbar region: Secondary | ICD-10-CM | POA: Insufficient documentation

## 2020-03-09 DIAGNOSIS — C052 Malignant neoplasm of uvula: Secondary | ICD-10-CM

## 2020-03-09 DIAGNOSIS — J449 Chronic obstructive pulmonary disease, unspecified: Secondary | ICD-10-CM | POA: Diagnosis not present

## 2020-03-09 DIAGNOSIS — F419 Anxiety disorder, unspecified: Secondary | ICD-10-CM | POA: Diagnosis not present

## 2020-03-09 DIAGNOSIS — R05 Cough: Secondary | ICD-10-CM | POA: Diagnosis not present

## 2020-03-09 DIAGNOSIS — R911 Solitary pulmonary nodule: Secondary | ICD-10-CM

## 2020-03-09 LAB — CBC WITH DIFFERENTIAL/PLATELET
Abs Immature Granulocytes: 0.07 10*3/uL (ref 0.00–0.07)
Basophils Absolute: 0.1 10*3/uL (ref 0.0–0.1)
Basophils Relative: 1 %
Eosinophils Absolute: 0.1 10*3/uL (ref 0.0–0.5)
Eosinophils Relative: 1 %
HCT: 34.1 % — ABNORMAL LOW (ref 36.0–46.0)
Hemoglobin: 10.5 g/dL — ABNORMAL LOW (ref 12.0–15.0)
Immature Granulocytes: 1 %
Lymphocytes Relative: 20 %
Lymphs Abs: 2.1 10*3/uL (ref 0.7–4.0)
MCH: 28.5 pg (ref 26.0–34.0)
MCHC: 30.8 g/dL (ref 30.0–36.0)
MCV: 92.7 fL (ref 80.0–100.0)
Monocytes Absolute: 0.6 10*3/uL (ref 0.1–1.0)
Monocytes Relative: 6 %
Neutro Abs: 7.3 10*3/uL (ref 1.7–7.7)
Neutrophils Relative %: 71 %
Platelets: 306 10*3/uL (ref 150–400)
RBC: 3.68 MIL/uL — ABNORMAL LOW (ref 3.87–5.11)
RDW: 13.7 % (ref 11.5–15.5)
WBC: 10.2 10*3/uL (ref 4.0–10.5)
nRBC: 0 % (ref 0.0–0.2)

## 2020-03-09 LAB — IRON AND TIBC
Iron: 80 ug/dL (ref 28–170)
Saturation Ratios: 24 % (ref 10.4–31.8)
TIBC: 337 ug/dL (ref 250–450)
UIBC: 257 ug/dL

## 2020-03-09 LAB — COMPREHENSIVE METABOLIC PANEL
ALT: 17 U/L (ref 0–44)
AST: 17 U/L (ref 15–41)
Albumin: 4.6 g/dL (ref 3.5–5.0)
Alkaline Phosphatase: 77 U/L (ref 38–126)
Anion gap: 10 (ref 5–15)
BUN: 13 mg/dL (ref 8–23)
CO2: 29 mmol/L (ref 22–32)
Calcium: 10 mg/dL (ref 8.9–10.3)
Chloride: 100 mmol/L (ref 98–111)
Creatinine, Ser: 0.62 mg/dL (ref 0.44–1.00)
GFR calc Af Amer: >60 mL/min (ref >60)
GFR calc non Af Amer: >60 mL/min (ref >60)
Glucose, Bld: 109 mg/dL — ABNORMAL HIGH (ref 70–99)
Potassium: 4.3 mmol/L (ref 3.5–5.1)
Sodium: 139 mmol/L (ref 135–145)
Total Bilirubin: 0.4 mg/dL (ref 0.3–1.2)
Total Protein: 7.2 g/dL (ref 6.5–8.1)

## 2020-03-09 LAB — VITAMIN B12: Vitamin B-12: 263 pg/mL (ref 180–914)

## 2020-03-09 LAB — FERRITIN: Ferritin: 135 ng/mL (ref 11–307)

## 2020-03-09 LAB — FOLATE: Folate: 16 ng/mL (ref >5.9)

## 2020-03-09 NOTE — Patient Instructions (Addendum)
[place] at [facility] Discharge Instructions  You were seen today by Dr. Delton Coombes. He went over your recent results. You will have blood drawn for analysis today. Please buy over-the-counter lotion for the dry skin and apply three times daily. You will be scheduled for a PET scan. Dr. Delton Coombes will see you back after your PET scan for labs and follow up.   Thank you for choosing [place] at [facility] to provide your oncology and hematology care.  To afford each patient quality time with our provider, please arrive at least 15 minutes before your scheduled appointment time.   If you have a lab appointment with the [facility] please come in thru the Main Entrance and check in at the main information desk  You need to re-schedule your appointment should you arrive 10 or more minutes late.  We strive to give you quality time with our providers, and arriving late affects you and other patients whose appointments are after yours.  Also, if you no show three or more times for appointments you may be dismissed from the clinic at the providers discretion.     Again, thank you for choosing [facility].  Our hope is that these requests will decrease the amount of time that you wait before being seen by our physicians.       _____________________________________________________________  Should you have questions after your visit to [facility], please contact our office at [phone] between the hours of 8:00 a.m. and 4:30 p.m.  Voicemails left after 4:00 p.m. will not be returned until the following business day.  For prescription refill requests, have your pharmacy contact our office and allow 72 hours.    Cancer Center Support Programs:   > Cancer Support Group  2nd Tuesday of the month 1pm-2pm, Journey Room

## 2020-03-09 NOTE — Progress Notes (Signed)
[place], [place] [address]   CLINIC:  Medical Oncology/Hematology  PCP:  [name], MD [address] [place] [address] [phone]   REASON FOR VISIT:  Follow-up for lung nodules, uvular cancer and anemia.  CURRENT THERAPY: Observation  BRIEF ONCOLOGIC HISTORY:  Oncology History   No history exists.    CANCER STAGING: Cancer Staging No matching staging information was found for the patient.  INTERVAL HISTORY:  Ms. Lindsey Combs, a 74 y.o. female, returns for routine follow-up of her lung nodules, uvular cancer and anemia. Aizlyn was last seen on 08/20/2019.  Today she reports that her arthritis is bothering her most. She reports numbness and weakness in her bilat lower legs. She reports a dry cough. She has had wheezing this morning and had to use albuterol. She reports being severely fatigued and poor sleep because of joint pain.   REVIEW OF SYSTEMS:  Review of Systems  Constitutional: Positive for appetite change (moderately decreased) and fatigue (severe).  Respiratory: Positive for cough and shortness of breath.   Cardiovascular: Positive for chest pain (w/ coughing).  Genitourinary: Positive for frequency (urgency).   Musculoskeletal: Positive for arthralgias (10/10 joint pain).  Skin: Positive for itching.  Neurological: Positive for dizziness, headaches and numbness (fingers & toes).  Psychiatric/Behavioral: Positive for depression and sleep disturbance. The patient is nervous/anxious.   All other systems reviewed and are negative.   PAST MEDICAL/SURGICAL HISTORY:  Past Medical History:  Diagnosis Date  . Acute respiratory failure with hypoxia ([facility])   . Anxiety   . Cancer (H[facility])    uvular per pt  . COPD (chronic obstructive pulmonary disease) ([facility])   . DDD (degenerative disc disease), lumbar   . Degenerative joint disease of spine   . Diverticulosis   . GERD (gastroesophageal reflux disease)    . H. pylori infection    treated 02/2013.  [name] History of UTI   . Hypercholesterolemia   . Hypertension   . Hyponatremia   . Peripheral neuropathy   . Psoriatic arthritis ([facility])   . Psoriatic arthritis ([facility])   . Shortness of breath    with exertion  . Tobacco abuse    Past Surgical History:  Procedure Laterality Date  . ABDOMINAL EXPLORATION SURGERY     age 34, large ovarian cyst  . BIOPSY N/A 03/01/2013   AST:MHDQ hiatal hernia; otherwise, normal examination/ Status post biopsies as described above. SB bx negative for Celiac. +h/pylori  . CATARACT EXTRACTION W/ INTRAOCULAR LENS  IMPLANT, BILATERAL    . COLONOSCOPY  10/16/09   Jen[facility]:3 polypsin the sigmoid colon/polyps in the descending colon and the rectum/scattered diverticulum. hyperplastic  . COLONOSCOPY WITH ESOPHAGOGASTRODUODENOSCOPY (EGD) N/A 03/01/2013   QIW:[id number] colonic polyps - treated/removed as described above. Colonic diverticulosis. Hyperplastic. Next TCS 02/2018.  Gastric biopsy showed H. pylori.  She completed treatment.  [name] DIRECT LARYNGOSCOPY N/A 06/08/2016   Procedure: DIRECT LARYNGOSCOPY AND BIOPSY;  Surgeon: Leta Baptist, MD;  Location: [facility] OR;  Service: ENT;  Laterality: N/A;  . PARTIAL HYSTERECTOMY     age 11  . UVULECTOMY      SOCIAL HISTORY:  Social History   Socioeconomic History  . Marital status: Divorced    Spouse name: Not on file  . Number of children: 4  . Years of education: Not on file  . Highest education level: Not on file  Occupational History  . Not on file  Tobacco Use  . Smoking  status: Former Smoker    Packs/day: 1.50    Years: 50.00    Pack years: 75.00    Types: Cigarettes    Quit date: 07/16/2018    Years since quitting: 1.6  . Smokeless tobacco: Never Used  Vaping Use  . Vaping Use: Never used  Substance and Sexual Activity  . Alcohol use: Not Currently    Comment: glass of wine occasionally  . Drug use: No  . Sexual activity: Not on file  Other Topics Concern  . Not on file   Social History Narrative  . Not on file   Social Determinants of Health   Financial Resource Strain:   . Difficulty of Paying Living Expenses:   Food Insecurity:   . Worried About Charity fundraiser in the Last Year:   . Arboriculturist in the Last Year:   Transportation Needs:   . Film/video editor (Medical):   [name] Lack of Transportation (Non-Medical):   Physical Activity:   . Days of Exercise per Week:   . Minutes of Exercise per Session:   Stress:   . Feeling of Stress :   Social Connections:   . Frequency of Communication with Friends and Family:   . Frequency of Social Gatherings with Friends and Family:   . Attends Religious Services:   . Active Member of Clubs or Organizations:   . Attends Archivist Meetings:   [name] Marital Status:   Intimate Partner Violence:   . Fear of Current or Ex-Partner:   . Emotionally Abused:   [name] Physically Abused:   . Sexually Abused:     FAMILY HISTORY:  Family History  Problem Relation Age of Onset  . Leukemia Father   . Thyroid disease Father   . Stomach cancer Paternal Grandfather   . Colon cancer Neg Hx     CURRENT MEDICATIONS:  Current Outpatient Medications  Medication Sig Dispense Refill  . ALPRAZolam (XANAX) 0.5 MG tablet Take 1 tablet by mouth 2 (two) times daily.     [name] amLODipine (NORVASC) 5 MG tablet TAKE 1 TABLET BY MOUTH ONCE A DAY. 30 tablet 1  . atorvastatin (LIPITOR) 20 MG tablet Take 1 tablet by mouth daily.    . busPIRone (BUSPAR) 7.5 MG tablet Take 1 tablet by mouth 2 (two) times a day.    . Carboxymethylcellulose Sodium (ARTIFICIAL TEARS OP) Apply to eye as needed.    . Ergocalciferol (VITAMIN D2) 10 MCG (400 UNIT) TABS Take 1 tablet by mouth daily.    . fluticasone (FLONASE) 50 MCG/ACT nasal spray Place 1 spray into both nostrils daily.    . fluticasone furoate-vilanterol (BREO ELLIPTA) 100-25 MCG/INH AEPB Inhale 1 puff into the lungs daily.    [name] loratadine (CLARITIN) 10 MG tablet Take 10 mg by  mouth daily.    . melatonin 5 MG TABS TAKE 1 TABLET BY MOUTHOONCE DAILY AT BEDTIME    . Neomycin-Bacitracin-Polymyxin (HCA TRIPLE ANTIBIOTIC OINTMENT EX) Apply topically daily. Apply topically to the inner left lower leg once daily and cover with bandaid    . povidone-iodine (BETADINE) 10 % ointment Apply 1 application topically daily. Apply to affected area daily    . traMADol (ULTRAM) 50 MG tablet Take 50 mg by mouth as needed.     . triamcinolone cream (KENALOG) 0.5 % Apply 1 application topically 2 (two) times daily. As needed    . acetaminophen (TYLENOL) 325 MG tablet Take 650 mg by mouth every 4 (four) hours  as needed.  (Patient not taking: Reported on 03/09/2020)    . albuterol (PROVENTIL, VENTOLIN) (5 MG/ML) 0.5% NEBU Take 1.25 mg by nebulization 3 (three) times daily.  (Patient not taking: Reported on 03/09/2020)    . albuterol (VENTOLIN HFA) 108 (90 Base) MCG/ACT inhaler Inhale 1-2 puffs into the lungs every 6 (six) hours as needed for wheezing or shortness of breath. (Patient not taking: Reported on 03/09/2020) 8 g 0  . Pseudoephedrine-Guaifenesin (MUCINEX D MAX STRENGTH) [id number] MG TB12 Take by mouth as needed. (Patient not taking: Reported on 03/09/2020)     No current facility-administered medications for this visit.    ALLERGIES:  Allergies  Allergen Reactions  . Prednisone Nausea Only  . Chantix [Varenicline] Other (See Comments)    Mental status changes  . Codeine Itching    PHYSICAL EXAM:  Performance status (ECOG): 1 - Symptomatic but completely ambulatory  Vitals:   03/09/20 1124  BP: 128/60  Pulse: 90  Resp: 19  Temp: (!) 97.5 F (36.4 C)  SpO2: 99%   Wt Readings from Last 3 Encounters:  03/09/20 143 lb 14.4 oz (65.3 kg)  09/03/19 136 lb (61.7 kg)  08/20/19 136 lb 14.4 oz (62.1 kg)   Physical Exam Vitals reviewed.  Constitutional:      Appearance: Normal appearance.  HENT:     Mouth/Throat:     Mouth: No oral lesions.     Tongue: No lesions.      Comments: Absent uvula Cardiovascular:     Rate and Rhythm: Normal rate and regular rhythm.     Pulses: Normal pulses.     Heart sounds: Normal heart sounds.  Pulmonary:     Effort: Pulmonary effort is normal.     Breath sounds: Wheezing (mild bilat) present.  Abdominal:     Palpations: Abdomen is soft. There is no hepatomegaly or mass.     Tenderness: There is abdominal tenderness (LUQ).  Lymphadenopathy:     Cervical: No cervical adenopathy.     Upper Body:     Right upper body: No supraclavicular adenopathy.     Left upper body: No supraclavicular adenopathy.  Neurological:     General: No focal deficit present.     Mental Status: She is alert and oriented to person, place, and time.  Psychiatric:        Mood and Affect: Mood normal.        Behavior: Behavior normal.      LABORATORY DATA:  I have reviewed the labs as listed.  CBC Latest Ref Rng & Units 09/03/2019 08/16/2019 02/07/2019  WBC 4.0 - 10.5 K/uL 5.0 10.4 8.1  Hemoglobin 12.0 - 15.0 g/dL 10.0(L) 9.8(L) 9.7(L)  Hematocrit 36 - 46 % 32.4(L) 31.5(L) 31.6(L)  Platelets 150 - 400 K/uL 174 347 254   CMP Latest Ref Rng & Units 09/03/2019 08/16/2019 02/07/2019  Glucose 70 - 99 mg/dL 113(H) 112(H) 117(H)  BUN 8 - 23 mg/dL 9 11 13   Creatinine 0.44 - 1.00 mg/dL 0.64 0.61 0.55  Sodium 135 - 145 mmol/L 135 136 138  Potassium 3.5 - 5.1 mmol/L 4.0 4.2 3.9  Chloride 98 - 111 mmol/L 95(L) 98 96(L)  CO2 22 - 32 mmol/L 27 28 32  Calcium 8.9 - 10.3 mg/dL 9.2 9.8 9.9  Total Protein 6.5 - 8.1 g/dL 7.2 7.4 7.1  Total Bilirubin 0.3 - 1.2 mg/dL 0.3 0.4 0.5  Alkaline Phos 38 - 126 U/L 59 66 53  AST 15 - 41 U/L 19 17 16  ALT 0 - 44 U/L 16 15 14     DIAGNOSTIC IMAGING:  I have independently reviewed the scans and discussed with the patient.   ASSESSMENT:  1.  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 Nicolette Bang at [facility].  Pathology shows margins negative, p16 was positive. -PET scan on 08/30/2019  shows mild asymmetry along the oropharynx without CT correlate.  2.  Right upper lobe pulmonary nodule: -She quit smoking in October 2019. -CT chest with contrast from 08/16/2019 shows new solid 1.4 x 1.1 cm left upper lobe nodule abutting the major fissure.  New 2.3 x 2.0 airspace opacity in the right middle lobe with several other small nodules. -PET scan on 08/30/2019 shows some of the new/progressive solid nodular and subsolid opacities in the lungs shown on the CT from 19/14/7829 are hypermetabolic including left upper lobe nodule near the fissure, bandlike nodularity in the right middle lobe and subsolid nodularity posteriorly in the right upper lobe.  3.  Anemia of chronic inflammation: -Labs on 09/03/2019 shows hemoglobin 10, MCV 94.5 with normal white count and platelet count. -Colonoscopy and EGD were on 03/01/2013.   PLAN:  1.  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 -I have recommended PET scan.  2.  Right upper lobe pulmonary nodule: -We have reviewed PET scan from 08/30/2019.  She was lost to follow-up after the last PET scan. -Because of new developing lung lesions, I have recommended another scan.  I will see her back after the scan.  3.  Anemia of chronic inflammation: -I have recommended CBC, CMP, ferritin, iron panel, F62 and folic acid levels.    Orders placed this encounter:  No orders of the defined types were placed in this encounter.  Total time spent is 30 minutes with more than 50% of the time spent face-to-face discussing previous scans, need for further scan, counseling and coordination of care.  Derek Jack, MD [place] [phone]   I, Milinda Antis, am acting as a scribe for Dr. Sanda Linger.  I, Derek Jack MD, have reviewed the above documentation for accuracy and completeness, and I agree with the above.

## 2020-03-10 ENCOUNTER — Other Ambulatory Visit (HOSPITAL_COMMUNITY): Payer: Self-pay | Admitting: *Deleted

## 2020-03-10 DIAGNOSIS — D509 Iron deficiency anemia, unspecified: Secondary | ICD-10-CM

## 2020-03-27 ENCOUNTER — Other Ambulatory Visit: Payer: Self-pay

## 2020-03-27 ENCOUNTER — Ambulatory Visit (HOSPITAL_COMMUNITY)
Admission: RE | Admit: 2020-03-27 | Discharge: 2020-03-27 | Disposition: A | Discharge status: Home / Self Care | Payer: Medicare Other | Source: Ambulatory Visit | Attending: Hematology | Admitting: Hematology

## 2020-03-27 DIAGNOSIS — I2510 Atherosclerotic heart disease of native coronary artery without angina pectoris: Secondary | ICD-10-CM | POA: Insufficient documentation

## 2020-03-27 DIAGNOSIS — I700 Atherosclerosis of aorta: Secondary | ICD-10-CM | POA: Diagnosis not present

## 2020-03-27 DIAGNOSIS — K5730 Diverticulosis of large intestine without perforation or abscess without bleeding: Secondary | ICD-10-CM | POA: Insufficient documentation

## 2020-03-27 DIAGNOSIS — J439 Emphysema, unspecified: Secondary | ICD-10-CM | POA: Insufficient documentation

## 2020-03-27 DIAGNOSIS — R911 Solitary pulmonary nodule: Secondary | ICD-10-CM | POA: Insufficient documentation

## 2020-03-27 DIAGNOSIS — M4856XA Collapsed vertebra, not elsewhere classified, lumbar region, initial encounter for fracture: Secondary | ICD-10-CM | POA: Diagnosis not present

## 2020-03-27 LAB — GLUCOSE, CAPILLARY: Glucose-Capillary: 110 mg/dL — ABNORMAL HIGH (ref 70–99)

## 2020-03-27 MED ORDER — FLUDEOXYGLUCOSE F - 18 (FDG) INJECTION: 7.1200 | Freq: Once | INTRAVENOUS | Status: DC | PRN

## 2020-04-01 ENCOUNTER — Ambulatory Visit (HOSPITAL_COMMUNITY): Payer: Medicare Other | Admitting: Hematology

## 2020-04-01 ENCOUNTER — Other Ambulatory Visit (HOSPITAL_COMMUNITY): Payer: Medicare Other

## 2020-04-06 ENCOUNTER — Inpatient Hospital Stay (HOSPITAL_BASED_OUTPATIENT_CLINIC_OR_DEPARTMENT_OTHER): Payer: Medicare Other | Admitting: Hematology

## 2020-04-06 ENCOUNTER — Other Ambulatory Visit: Payer: Self-pay

## 2020-04-06 ENCOUNTER — Encounter (HOSPITAL_COMMUNITY): Payer: Self-pay | Admitting: Hematology

## 2020-04-06 ENCOUNTER — Inpatient Hospital Stay (HOSPITAL_COMMUNITY): Payer: Medicare Other | Attending: Hematology

## 2020-04-06 VITALS — BP 154/75 | HR 94 | Temp 97.5°F | Resp 18 | Wt 143.8 lb

## 2020-04-06 DIAGNOSIS — I10 Essential (primary) hypertension: Secondary | ICD-10-CM | POA: Diagnosis not present

## 2020-04-06 DIAGNOSIS — Z87891 Personal history of nicotine dependence: Secondary | ICD-10-CM | POA: Insufficient documentation

## 2020-04-06 DIAGNOSIS — C052 Malignant neoplasm of uvula: Secondary | ICD-10-CM | POA: Diagnosis not present

## 2020-04-06 DIAGNOSIS — D649 Anemia, unspecified: Secondary | ICD-10-CM | POA: Insufficient documentation

## 2020-04-06 DIAGNOSIS — Z85819 Personal history of malignant neoplasm of unspecified site of lip, oral cavity, and pharynx: Secondary | ICD-10-CM | POA: Diagnosis present

## 2020-04-06 DIAGNOSIS — E7800 Pure hypercholesterolemia, unspecified: Secondary | ICD-10-CM | POA: Insufficient documentation

## 2020-04-06 DIAGNOSIS — J9601 Acute respiratory failure with hypoxia: Secondary | ICD-10-CM | POA: Diagnosis not present

## 2020-04-06 DIAGNOSIS — R918 Other nonspecific abnormal finding of lung field: Secondary | ICD-10-CM | POA: Insufficient documentation

## 2020-04-06 DIAGNOSIS — K219 Gastro-esophageal reflux disease without esophagitis: Secondary | ICD-10-CM | POA: Diagnosis not present

## 2020-04-06 DIAGNOSIS — Z79899 Other long term (current) drug therapy: Secondary | ICD-10-CM | POA: Diagnosis not present

## 2020-04-06 DIAGNOSIS — Z806 Family history of leukemia: Secondary | ICD-10-CM | POA: Diagnosis not present

## 2020-04-06 DIAGNOSIS — J449 Chronic obstructive pulmonary disease, unspecified: Secondary | ICD-10-CM | POA: Insufficient documentation

## 2020-04-06 DIAGNOSIS — D509 Iron deficiency anemia, unspecified: Secondary | ICD-10-CM

## 2020-04-06 LAB — COMPREHENSIVE METABOLIC PANEL
ALT: 15 U/L (ref 0–44)
AST: 14 U/L — ABNORMAL LOW (ref 15–41)
Albumin: 4 g/dL (ref 3.5–5.0)
Alkaline Phosphatase: 81 U/L (ref 38–126)
Anion gap: 9 (ref 5–15)
BUN: 10 mg/dL (ref 8–23)
CO2: 30 mmol/L (ref 22–32)
Calcium: 9.8 mg/dL (ref 8.9–10.3)
Chloride: 100 mmol/L (ref 98–111)
Creatinine, Ser: 0.61 mg/dL (ref 0.44–1.00)
GFR calc Af Amer: >60 mL/min (ref >60)
GFR calc non Af Amer: >60 mL/min (ref >60)
Glucose, Bld: 117 mg/dL — ABNORMAL HIGH (ref 70–99)
Potassium: 4.6 mmol/L (ref 3.5–5.1)
Sodium: 139 mmol/L (ref 135–145)
Total Bilirubin: 0.5 mg/dL (ref 0.3–1.2)
Total Protein: 6.8 g/dL (ref 6.5–8.1)

## 2020-04-06 LAB — VITAMIN B12: Vitamin B-12: 304 pg/mL (ref 180–914)

## 2020-04-06 LAB — CBC WITH DIFFERENTIAL/PLATELET
Abs Immature Granulocytes: 0.06 10*3/uL (ref 0.00–0.07)
Basophils Absolute: 0.1 10*3/uL (ref 0.0–0.1)
Basophils Relative: 1 %
Eosinophils Absolute: 0.1 10*3/uL (ref 0.0–0.5)
Eosinophils Relative: 1 %
HCT: 32.1 % — ABNORMAL LOW (ref 36.0–46.0)
Hemoglobin: 10 g/dL — ABNORMAL LOW (ref 12.0–15.0)
Immature Granulocytes: 1 %
Lymphocytes Relative: 17 %
Lymphs Abs: 1.9 10*3/uL (ref 0.7–4.0)
MCH: 29.1 pg (ref 26.0–34.0)
MCHC: 31.2 g/dL (ref 30.0–36.0)
MCV: 93.3 fL (ref 80.0–100.0)
Monocytes Absolute: 0.6 10*3/uL (ref 0.1–1.0)
Monocytes Relative: 5 %
Neutro Abs: 8.6 10*3/uL — ABNORMAL HIGH (ref 1.7–7.7)
Neutrophils Relative %: 75 %
Platelets: 263 10*3/uL (ref 150–400)
RBC: 3.44 MIL/uL — ABNORMAL LOW (ref 3.87–5.11)
RDW: 13.5 % (ref 11.5–15.5)
WBC: 11.3 10*3/uL — ABNORMAL HIGH (ref 4.0–10.5)
nRBC: 0 % (ref 0.0–0.2)

## 2020-04-06 LAB — IRON AND TIBC
Iron: 91 ug/dL (ref 28–170)
Saturation Ratios: 29 % (ref 10.4–31.8)
TIBC: 309 ug/dL (ref 250–450)
UIBC: 218 ug/dL

## 2020-04-06 LAB — FERRITIN: Ferritin: 116 ng/mL (ref 11–307)

## 2020-04-06 LAB — FOLATE: Folate: 17.5 ng/mL (ref >5.9)

## 2020-04-06 NOTE — Progress Notes (Signed)
[place], [place] [address]   CLINIC:  Medical Oncology/Hematology  PCP:  [name], MD [address] [place] [address]  [phone]  REASON FOR VISIT:  Follow-up for lung nodules, uvular cancer and anemia of chronic inflammation  PRIOR THERAPY: Uvular resection on 08/08/2016  CURRENT THERAPY: Observation  INTERVAL HISTORY:  Lindsey Combs, a 74 y.o. female, returns for routine follow-up for her lung nodules, uvular cancer and anemia of chronic inflammation. Lindsey Combs was last seen on 03/09/2020.  Today she reports having pain in multiple joints due to arthritis and takes Tylenol daily for that. She reports that her cough is stable. She needs to wear nasal cannula at 3 L for her SOB with exertion.   REVIEW OF SYSTEMS:  Review of Systems  Constitutional: Positive for appetite change (depleted) and fatigue (depleted).  Respiratory: Positive for shortness of breath (w/ exertion, on 3 L [place]).   Musculoskeletal: Positive for arthralgias (6/10 in multiple joints d/t arthritis).  Neurological: Positive for numbness (below knees).  All other systems reviewed and are negative.   PAST MEDICAL/SURGICAL HISTORY:  Past Medical History:  Diagnosis Date  . Acute respiratory failure with hypoxia ([facility])   . Anxiety   . Cancer (H[facility])    uvular per pt  . COPD (chronic obstructive pulmonary disease) ([place])   . DDD (degenerative disc disease), lumbar   . Degenerative joint disease of spine   . Diverticulosis   . GERD (gastroesophageal reflux disease)   . H. pylori infection    treated 02/2013.  [name] History of UTI   . Hypercholesterolemia   . Hypertension   . Hyponatremia   . Peripheral neuropathy   . Shortness of breath    with exertion  . Tobacco abuse    Past Surgical History:  Procedure Laterality Date  . ABDOMINAL EXPLORATION SURGERY     age 46, large ovarian cyst  . BIOPSY N/A 03/01/2013   KVQ:QVZD hiatal hernia;  otherwise, normal examination/ Status post biopsies as described above. SB bx negative for Celiac. +h/pylori  . CATARACT EXTRACTION W/ INTRAOCULAR LENS  IMPLANT, BILATERAL    . COLONOSCOPY  10/16/09   Jenkins:3 polypsin the sigmoid colon/polyps in the descending colon and the rectum/scattered diverticulum. hyperplastic  . COLONOSCOPY WITH ESOPHAGOGASTRODUODENOSCOPY (EGD) N/A 03/01/2013   GLO:VFIEPPIR colonic polyps - treated/removed as described above. Colonic diverticulosis. Hyperplastic. Next TCS 02/2018.  Gastric biopsy showed H. pylori.  She completed treatment.  [name] DIRECT LARYNGOSCOPY N/A 06/08/2016   Procedure: DIRECT LARYNGOSCOPY AND BIOPSY;  Surgeon: Leta Baptist, MD;  Location: [facility] OR;  Service: ENT;  Laterality: N/A;  . PARTIAL HYSTERECTOMY     age 89  . UVULECTOMY      SOCIAL HISTORY:  Social History   Socioeconomic History  . Marital status: Divorced    Spouse name: Not on file  . Number of children: 4  . Years of education: Not on file  . Highest education level: Not on file  Occupational History  . Not on file  Tobacco Use  . Smoking status: Former Smoker    Packs/day: 1.50    Years: 50.00    Pack years: 75.00    Types: Cigarettes    Quit date: 07/16/2018    Years since quitting: 1.7  . Smokeless tobacco: Never Used  Vaping Use  . Vaping Use: Never used  Substance and Sexual Activity  . Alcohol use: Not Currently    Comment: glass  of wine occasionally  . Drug use: No  . Sexual activity: Not on file  Other Topics Concern  . Not on file  Social History Narrative  . Not on file   Social Determinants of Health   Financial Resource Strain:   . Difficulty of Paying Living Expenses:   Food Insecurity:   . Worried About Charity fundraiser in the Last Year:   . Arboriculturist in the Last Year:   Transportation Needs:   . Film/video editor (Medical):   [name] Lack of Transportation (Non-Medical):   Physical Activity:   . Days of Exercise per Week:   . Minutes of  Exercise per Session:   Stress:   . Feeling of Stress :   Social Connections:   . Frequency of Communication with Friends and Family:   . Frequency of Social Gatherings with Friends and Family:   . Attends Religious Services:   . Active Member of Clubs or Organizations:   . Attends Archivist Meetings:   [name] Marital Status:   Intimate Partner Violence:   . Fear of Current or Ex-Partner:   . Emotionally Abused:   [name] Physically Abused:   . Sexually Abused:     FAMILY HISTORY:  Family History  Problem Relation Age of Onset  . Leukemia Father   . Thyroid disease Father   . Stomach cancer Paternal Grandfather   . Colon cancer Neg Hx     CURRENT MEDICATIONS:  Current Outpatient Medications  Medication Sig Dispense Refill  . acetaminophen (TYLENOL) 325 MG tablet Take 650 mg by mouth every 4 (four) hours as needed.     [name] albuterol (PROVENTIL, VENTOLIN) (5 MG/ML) 0.5% NEBU Take 1.25 mg by nebulization 3 (three) times daily.     [name] albuterol (VENTOLIN HFA) 108 (90 Base) MCG/ACT inhaler Inhale 1-2 puffs into the lungs every 6 (six) hours as needed for wheezing or shortness of breath. 8 g 0  . ALPRAZolam (XANAX) 0.5 MG tablet Take 1 tablet by mouth 2 (two) times daily.     [name] amLODipine (NORVASC) 5 MG tablet TAKE 1 TABLET BY MOUTH ONCE A DAY. 30 tablet 1  . atorvastatin (LIPITOR) 40 MG tablet Take 40 mg by mouth daily.    . busPIRone (BUSPAR) 7.5 MG tablet Take 1 tablet by mouth 2 (two) times a day.    . Carboxymethylcellulose Sodium (ARTIFICIAL TEARS OP) Apply to eye as needed.    . Ergocalciferol (VITAMIN D2) 10 MCG (400 UNIT) TABS Take 1 tablet by mouth daily.    . fluticasone (FLONASE) 50 MCG/ACT nasal spray Place 1 spray into both nostrils daily.    . fluticasone furoate-vilanterol (BREO ELLIPTA) 100-25 MCG/INH AEPB Inhale 1 puff into the lungs daily.    [name] loratadine (CLARITIN) 10 MG tablet Take 10 mg by mouth daily.    . melatonin 5 MG TABS TAKE 1 TABLET BY MOUTHOONCE DAILY AT  BEDTIME    . Neomycin-Bacitracin-Polymyxin (HCA TRIPLE ANTIBIOTIC OINTMENT EX) Apply topically daily. Apply topically to the inner left lower leg once daily and cover with bandaid    . povidone-iodine (BETADINE) 10 % ointment Apply 1 application topically daily. Apply to affected area daily    . Pseudoephedrine-Guaifenesin (MUCINEX D MAX STRENGTH) [phone] MG TB12 Take by mouth as needed.     . traMADol (ULTRAM) 50 MG tablet Take 50 mg by mouth as needed.     . triamcinolone cream (KENALOG) 0.5 % Apply 1 application topically 2 (  two) times daily. As needed     No current facility-administered medications for this visit.    ALLERGIES:  Allergies  Allergen Reactions  . Prednisone Nausea Only  . Chantix [Varenicline] Other (See Comments)    Mental status changes  . Codeine Itching    PHYSICAL EXAM:  Performance status (ECOG): 1 - Symptomatic but completely ambulatory  Vitals:   04/06/20 1051  BP: (!) 154/75  Pulse: 94  Resp: 18  Temp: (!) 97.5 F (36.4 C)  SpO2: 98%   Wt Readings from Last 3 Encounters:  04/06/20 143 lb 12.8 oz (65.2 kg)  03/09/20 143 lb 14.4 oz (65.3 kg)  09/03/19 136 lb (61.7 kg)   Physical Exam Vitals reviewed.  Constitutional:      Appearance: Normal appearance.  Cardiovascular:     Rate and Rhythm: Normal rate and regular rhythm.     Pulses: Normal pulses.     Heart sounds: Normal heart sounds.  Pulmonary:     Effort: Pulmonary effort is normal.     Breath sounds: Wheezing (rare) present.  Musculoskeletal:     Right lower leg: No edema.     Left lower leg: No edema.  Neurological:     General: No focal deficit present.     Mental Status: She is alert and oriented to person, place, and time.  Psychiatric:        Mood and Affect: Mood normal.        Behavior: Behavior normal.     LABORATORY DATA:  I have reviewed the labs as listed.  CBC Latest Ref Rng & Units 04/06/2020 03/09/2020 09/03/2019  WBC 4.0 - 10.5 K/uL 11.3(H) 10.2 5.0    Hemoglobin 12.0 - 15.0 g/dL 10.0(L) 10.5(L) 10.0(L)  Hematocrit 36 - 46 % 32.1(L) 34.1(L) 32.4(L)  Platelets 150 - 400 K/uL 263 306 174   CMP Latest Ref Rng & Units 04/06/2020 03/09/2020 09/03/2019  Glucose 70 - 99 mg/dL 117(H) 109(H) 113(H)  BUN 8 - 23 mg/dL 10 13 9   Creatinine 0.44 - 1.00 mg/dL 0.61 0.62 0.64  Sodium 135 - 145 mmol/L 139 139 135  Potassium 3.5 - 5.1 mmol/L 4.6 4.3 4.0  Chloride 98 - 111 mmol/L 100 100 95(L)  CO2 22 - 32 mmol/L 30 29 27   Calcium 8.9 - 10.3 mg/dL 9.8 10.0 9.2  Total Protein 6.5 - 8.1 g/dL 6.8 7.2 7.2  Total Bilirubin 0.3 - 1.2 mg/dL 0.5 0.4 0.3  Alkaline Phos 38 - 126 U/L 81 77 59  AST 15 - 41 U/L 14(L) 17 19  ALT 0 - 44 U/L 15 17 16       Component Value Date/Time   RBC 3.44 (L) 04/06/2020 1009   MCV 93.3 04/06/2020 1009   MCV 90.2 01/21/2013 0000   MCH 29.1 04/06/2020 1009   MCHC 31.2 04/06/2020 1009   RDW 13.5 04/06/2020 1009   LYMPHSABS 1.9 04/06/2020 1009   MONOABS 0.6 04/06/2020 1009   EOSABS 0.1 04/06/2020 1009   BASOSABS 0.1 04/06/2020 1009   Lab Results  Component Value Date   TIBC 337 03/09/2020   TIBC 336 08/16/2019   TIBC 378 01/21/2013   FERRITIN 135 03/09/2020   FERRITIN 190 08/16/2019   FERRITIN 203 02/07/2019   IRONPCTSAT 24 03/09/2020   IRONPCTSAT 28 08/16/2019   IRONPCTSAT 12 01/21/2013    DIAGNOSTIC IMAGING:  I have independently reviewed the scans and discussed with the patient. NM PET Image Initial (PI) Skull Base To Thigh  Result Date: 03/27/2020  CLINICAL DATA:  Initial treatment strategy for pulmonary nodule. Uvulectomy due to carcinoma. History of vulvar neoplasm. EXAM: NUCLEAR MEDICINE PET SKULL BASE TO THIGH TECHNIQUE: 7.1 mCi F-18 FDG was injected intravenously. Full-ring PET imaging was performed from the skull base to thigh after the radiotracer. CT data was obtained and used for attenuation correction and anatomic localization. Fasting blood glucose: 110 mg/dl COMPARISON:  Multiple exams, including PET-CT  from 08/30/2019 FINDINGS: Mediastinal blood pool activity: SUV max 2.8 Liver activity: SUV max SIRT not NECK: No significant hypermetabolic activity suggestive of malignancy. Incidental CT findings: Left mastoid effusion. Bilateral common carotid atherosclerotic calcification. CHEST: A left lower lobe pulmonary nodule or infrahilar lymph node associated with occlusion of the left lower lobe bronchus has enlarged, measuring about 2.2 by 1.8 cm on image 74/4, with a maximum SUV of 32.3 (formerly 13.7). The bandlike density in the right upper lobe along the major fissure has mildly improved, with maximum SUV in this vicinity of 0.9 and formerly 2.7. This was likely inflammatory finding on the prior exam. The previous clustered ground-glass opacity in the right lower lobe shown on the prior exam has essentially resolved. The lesion in the right middle lobe shown on the prior exam has resolved. There was previously a hypermetabolic nodule posteriorly in the left upper lobe which has now resolved. Incidental CT findings: Coronary, aortic arch, and branch vessel atherosclerotic vascular disease. Centrilobular emphysema. Stable 5 by 3 mm right lower lobe nodule on image 48/8, not appreciably hypermetabolic but below sensitive PET-CT size thresholds. ABDOMEN/PELVIS: No significant abnormal hypermetabolic activity in this region. Incidental CT findings: Aortoiliac atherosclerotic vascular disease. Sigmoid colon diverticulosis. Low-density left adrenal thickening, borderline appearance for adenoma. SKELETON: No significant abnormal hypermetabolic activity in this region. Incidental CT findings: Interval superior endplate compression fracture at L5. Suspected compression fractures at T6 and T7, previously only appreciable at T7. IMPRESSION: 1. Most of the prior opacities and the dominant left upper lobe hypermetabolic nodule have resolved and were likely inflammatory. However, there is progressive enlargement of a left lower  lobe pulmonary nodule or infrahilar lymph node associated with occlusion of the left lower lobe bronchus, currently measuring 2.2 by 1.8 cm, and with maximum SUV of 32.3 (formerly 13.7). I cannot exclude a malignant pulmonary nodule and bronchoscopy with sampling may be warranted. 2. Interval compression fractures at L5 and T6; prior T7 compression fracture noted. 3. Other imaging findings of potential clinical significance: Left mastoid effusion. Aortic Atherosclerosis (ICD10-I70.0). Coronary atherosclerosis. Emphysema (ICD10-J43.9). Sigmoid colon diverticulosis. Electronically Signed   By: Van Clines M.D.   On: 03/27/2020 17:12     ASSESSMENT:  1. 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 Nicolette Bang at [facility].  Pathology shows margins negative, p16 was positive. -PET scan on 08/30/2019 shows mild asymmetry along the oropharynx without CT correlate. -PET scan on 03/27/2020 did not show any evidence of recurrence.  2.  Left lower lobe pulmonary nodule: -She quit smoking in October 2019. -CT chest with contrast from 08/16/2019 shows new solid 1.4 x 1.1 cm left upper lobe nodule abutting the major fissure.  New 2.3 x 2.0 airspace opacity in the right middle lobe with several other small nodules. -PET scan on 08/30/2019 shows some of the new/progressive solid nodular and subsolid opacities in the lungs shown on the CT from 26/94/8546 are hypermetabolic including left upper lobe nodule near the fissure, bandlike nodularity in the right middle lobe and subsolid nodularity posteriorly in the right upper lobe. -  PET scan on 03/27/2020 shows left lower lobe pulmonary nodule associated with occlusion of the left lower lobe bronchus, enlarged measuring 2.2 x 1.8 cm, SUV 32.3.  Right upper lobe bandlike density has improved.  Lesion in the right middle lobe has resolved.  Previously hypermetabolic nodule posteriorly in the left upper lobe has resolved.  3.Anemia of chronic  inflammation: -Labs on 09/03/2019 shows hemoglobin 10, MCV 94.5 with normal white count and platelet count. -Colonoscopy and EGD were on 03/01/2013.   PLAN:  1. 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  2.  Left lower lobe pulmonary nodule: -I discussed results of the PET scan and reviewed images. -I have recommended consultation with pulmonary with bronchoscopy and biopsy.  She will be a candidate for SBRT following biopsy. -I will see her back after the biopsy.  3.Anemia of chronic inflammation: -We reviewed labs which showed hemoglobin 10, MCV 93. -Ferritin was 116, percent saturation 29.  O83 and folic acid was normal.  Orders placed this encounter:  No orders of the defined types were placed in this encounter.    Derek Jack, MD [place] [phone]   I, Milinda Antis, am acting as a scribe for Dr. Sanda Linger.  I, Derek Jack MD, have reviewed the above documentation for accuracy and completeness, and I agree with the above.

## 2020-04-06 NOTE — Patient Instructions (Signed)
[place] at [facility] Discharge Instructions  You were seen today by Dr. Delton Coombes. He went over your recent results and scans. You will be scheduled to see a pulmonologist and have a bronchoscopy done. Dr. Delton Coombes will see you back in 5 weeks for follow up.   Thank you for choosing [place] at [facility] to provide your oncology and hematology care.  To afford each patient quality time with our provider, please arrive at least 15 minutes before your scheduled appointment time.   If you have a lab appointment with the [facility] please come in thru the Main Entrance and check in at the main information desk  You need to re-schedule your appointment should you arrive 10 or more minutes late.  We strive to give you quality time with our providers, and arriving late affects you and other patients whose appointments are after yours.  Also, if you no show three or more times for appointments you may be dismissed from the clinic at the providers discretion.     Again, thank you for choosing [facility].  Our hope is that these requests will decrease the amount of time that you wait before being seen by our physicians.       _____________________________________________________________  Should you have questions after your visit to [facility], please contact our office at [phone] between the hours of 8:00 a.m. and 4:30 p.m.  Voicemails left after 4:00 p.m. will not be returned until the following business day.  For prescription refill requests, have your pharmacy contact our office and allow 72 hours.    Cancer Center Support Programs:   > Cancer Support Group  2nd Tuesday of the month 1pm-2pm, Journey Room

## 2020-05-12 ENCOUNTER — Ambulatory Visit (HOSPITAL_COMMUNITY): Payer: Medicare Other | Admitting: Hematology

## 2020-05-12 ENCOUNTER — Inpatient Hospital Stay (HOSPITAL_COMMUNITY): Payer: Medicare Other

## 2020-06-09 ENCOUNTER — Ambulatory Visit (HOSPITAL_COMMUNITY): Payer: Medicare Other | Admitting: Hematology

## 2020-06-09 ENCOUNTER — Inpatient Hospital Stay (HOSPITAL_COMMUNITY): Payer: Medicare Other

## 2020-06-22 ENCOUNTER — Other Ambulatory Visit (HOSPITAL_COMMUNITY): Payer: Medicare Other

## 2020-06-22 ENCOUNTER — Ambulatory Visit (HOSPITAL_COMMUNITY): Payer: Medicare Other | Admitting: Hematology

## 2020-06-29 ENCOUNTER — Other Ambulatory Visit: Payer: Self-pay

## 2020-06-29 ENCOUNTER — Inpatient Hospital Stay (HOSPITAL_BASED_OUTPATIENT_CLINIC_OR_DEPARTMENT_OTHER): Payer: Medicare Other | Admitting: Hematology

## 2020-06-29 ENCOUNTER — Encounter (HOSPITAL_COMMUNITY): Payer: Self-pay | Admitting: Hematology

## 2020-06-29 ENCOUNTER — Inpatient Hospital Stay (HOSPITAL_COMMUNITY): Payer: Medicare Other | Attending: Hematology

## 2020-06-29 VITALS — BP 135/53 | HR 88 | Temp 97.1°F | Resp 18 | Wt 146.8 lb

## 2020-06-29 DIAGNOSIS — Z800 Family history of malignant neoplasm of digestive organs: Secondary | ICD-10-CM | POA: Diagnosis not present

## 2020-06-29 DIAGNOSIS — F419 Anxiety disorder, unspecified: Secondary | ICD-10-CM | POA: Diagnosis not present

## 2020-06-29 DIAGNOSIS — C052 Malignant neoplasm of uvula: Secondary | ICD-10-CM | POA: Insufficient documentation

## 2020-06-29 DIAGNOSIS — Z86010 Personal history of colonic polyps: Secondary | ICD-10-CM | POA: Diagnosis not present

## 2020-06-29 DIAGNOSIS — R110 Nausea: Secondary | ICD-10-CM | POA: Insufficient documentation

## 2020-06-29 DIAGNOSIS — Z8349 Family history of other endocrine, nutritional and metabolic diseases: Secondary | ICD-10-CM | POA: Insufficient documentation

## 2020-06-29 DIAGNOSIS — M2550 Pain in unspecified joint: Secondary | ICD-10-CM | POA: Diagnosis not present

## 2020-06-29 DIAGNOSIS — Z79899 Other long term (current) drug therapy: Secondary | ICD-10-CM | POA: Insufficient documentation

## 2020-06-29 DIAGNOSIS — Z87891 Personal history of nicotine dependence: Secondary | ICD-10-CM | POA: Insufficient documentation

## 2020-06-29 DIAGNOSIS — D509 Iron deficiency anemia, unspecified: Secondary | ICD-10-CM | POA: Diagnosis present

## 2020-06-29 DIAGNOSIS — R42 Dizziness and giddiness: Secondary | ICD-10-CM | POA: Insufficient documentation

## 2020-06-29 DIAGNOSIS — Z888 Allergy status to other drugs, medicaments and biological substances status: Secondary | ICD-10-CM | POA: Diagnosis not present

## 2020-06-29 DIAGNOSIS — M25511 Pain in right shoulder: Secondary | ICD-10-CM | POA: Insufficient documentation

## 2020-06-29 DIAGNOSIS — Z8719 Personal history of other diseases of the digestive system: Secondary | ICD-10-CM | POA: Diagnosis not present

## 2020-06-29 DIAGNOSIS — Z87440 Personal history of urinary (tract) infections: Secondary | ICD-10-CM | POA: Diagnosis not present

## 2020-06-29 DIAGNOSIS — Z806 Family history of leukemia: Secondary | ICD-10-CM | POA: Diagnosis not present

## 2020-06-29 DIAGNOSIS — R450 Nervousness: Secondary | ICD-10-CM | POA: Diagnosis not present

## 2020-06-29 DIAGNOSIS — Z885 Allergy status to narcotic agent status: Secondary | ICD-10-CM | POA: Diagnosis not present

## 2020-06-29 DIAGNOSIS — R519 Headache, unspecified: Secondary | ICD-10-CM | POA: Insufficient documentation

## 2020-06-29 DIAGNOSIS — R0602 Shortness of breath: Secondary | ICD-10-CM | POA: Insufficient documentation

## 2020-06-29 DIAGNOSIS — R911 Solitary pulmonary nodule: Secondary | ICD-10-CM | POA: Diagnosis not present

## 2020-06-29 DIAGNOSIS — Z23 Encounter for immunization: Secondary | ICD-10-CM | POA: Diagnosis not present

## 2020-06-29 LAB — CBC WITH DIFFERENTIAL/PLATELET
Abs Immature Granulocytes: 0.08 10*3/uL — ABNORMAL HIGH (ref 0.00–0.07)
Basophils Absolute: 0.1 10*3/uL (ref 0.0–0.1)
Basophils Relative: 0 %
Eosinophils Absolute: 0.1 10*3/uL (ref 0.0–0.5)
Eosinophils Relative: 1 %
HCT: 32.7 % — ABNORMAL LOW (ref 36.0–46.0)
Hemoglobin: 10.5 g/dL — ABNORMAL LOW (ref 12.0–15.0)
Immature Granulocytes: 1 %
Lymphocytes Relative: 14 %
Lymphs Abs: 1.7 10*3/uL (ref 0.7–4.0)
MCH: 29.8 pg (ref 26.0–34.0)
MCHC: 32.1 g/dL (ref 30.0–36.0)
MCV: 92.9 fL (ref 80.0–100.0)
Monocytes Absolute: 0.7 10*3/uL (ref 0.1–1.0)
Monocytes Relative: 6 %
Neutro Abs: 9.4 10*3/uL — ABNORMAL HIGH (ref 1.7–7.7)
Neutrophils Relative %: 78 %
Platelets: 284 10*3/uL (ref 150–400)
RBC: 3.52 MIL/uL — ABNORMAL LOW (ref 3.87–5.11)
RDW: 13.2 % (ref 11.5–15.5)
WBC: 12 10*3/uL — ABNORMAL HIGH (ref 4.0–10.5)
nRBC: 0 % (ref 0.0–0.2)

## 2020-06-29 LAB — COMPREHENSIVE METABOLIC PANEL
ALT: 13 U/L (ref 0–44)
AST: 14 U/L — ABNORMAL LOW (ref 15–41)
Albumin: 4.1 g/dL (ref 3.5–5.0)
Alkaline Phosphatase: 90 U/L (ref 38–126)
Anion gap: 9 (ref 5–15)
BUN: 12 mg/dL (ref 8–23)
CO2: 31 mmol/L (ref 22–32)
Calcium: 10 mg/dL (ref 8.9–10.3)
Chloride: 98 mmol/L (ref 98–111)
Creatinine, Ser: 0.71 mg/dL (ref 0.44–1.00)
GFR calc Af Amer: >60 mL/min (ref >60)
GFR calc non Af Amer: >60 mL/min (ref >60)
Glucose, Bld: 102 mg/dL — ABNORMAL HIGH (ref 70–99)
Potassium: 4.9 mmol/L (ref 3.5–5.1)
Sodium: 138 mmol/L (ref 135–145)
Total Bilirubin: 0.7 mg/dL (ref 0.3–1.2)
Total Protein: 6.6 g/dL (ref 6.5–8.1)

## 2020-06-29 LAB — VITAMIN B12: Vitamin B-12: 354 pg/mL (ref 180–914)

## 2020-06-29 LAB — FOLATE: Folate: 19.2 ng/mL (ref >5.9)

## 2020-06-29 LAB — IRON AND TIBC
Iron: 101 ug/dL (ref 28–170)
Saturation Ratios: 33 % — ABNORMAL HIGH (ref 10.4–31.8)
TIBC: 310 ug/dL (ref 250–450)
UIBC: 209 ug/dL

## 2020-06-29 LAB — FERRITIN: Ferritin: 177 ng/mL (ref 11–307)

## 2020-06-29 MED ORDER — INFLUENZA VAC A&B SA ADJ QUAD 0.5 ML IM PRSY
0.5000 mL | PREFILLED_SYRINGE | Freq: Once | INTRAMUSCULAR | Status: AC
  Administered 2020-06-29: 0.5 mL via INTRAMUSCULAR
  Filled 2020-06-29: qty 0.5

## 2020-06-29 NOTE — Progress Notes (Signed)
Patient tolerated flu vaccine injection with no complaints voiced.  Site clean and dry with no bruising or swelling noted at site.  Band aid applied.  VSS with discharge and left in a wheelchair with no s/s of distress noted.

## 2020-06-29 NOTE — Patient Instructions (Signed)
[place] at [facility] Discharge Instructions  You were seen today by Dr. Delton Coombes. He went over your recent results and scans. You will referred to a pulmonologist for a bronchoscopy and biopsy of your lung nodule. You will also be scheduled for a PET scan. Dr. Delton Coombes will see you back after your biopsy results for follow up.   Thank you for choosing [place] at [facility] to provide your oncology and hematology care.  To afford each patient quality time with our provider, please arrive at least 15 minutes before your scheduled appointment time.   If you have a lab appointment with the [facility] please come in thru the Main Entrance and check in at the main information desk  You need to re-schedule your appointment should you arrive 10 or more minutes late.  We strive to give you quality time with our providers, and arriving late affects you and other patients whose appointments are after yours.  Also, if you no show three or more times for appointments you may be dismissed from the clinic at the providers discretion.     Again, thank you for choosing [facility].  Our hope is that these requests will decrease the amount of time that you wait before being seen by our physicians.       _____________________________________________________________  Should you have questions after your visit to [facility], please contact our office at [phone] between the hours of 8:00 a.m. and 4:30 p.m.  Voicemails left after 4:00 p.m. will not be returned until the following business day.  For prescription refill requests, have your pharmacy contact our office and allow 72 hours.    Cancer Center Support Programs:   > Cancer Support Group  2nd Tuesday of the month 1pm-2pm, Journey Room

## 2020-06-29 NOTE — Progress Notes (Signed)
[name], Lindsey Combs [id number]   CLINIC:  Medical Oncology/Hematology  PCP:  [name], MD [address] [place] [address] [phone]   REASON FOR VISIT:  Follow-up for uvular cancer and IDA  PRIOR THERAPY: Uvular resection on 08/08/2016  NGS Results: Not done  CURRENT THERAPY: Iron tablets daily  BRIEF ONCOLOGIC HISTORY:  Oncology History   No history exists.    CANCER STAGING: Cancer Staging No matching staging information was found for the patient.  INTERVAL HISTORY:  Ms. Lindsey Combs, a 74 y.o. female, returns for routine follow-up of her uvular cancer and IDA. Lindsey Combs was last seen on 04/06/2020.  Today she is accompanied by her daughter. She reports that her fatigue did not improve after her last Feraheme on 2/6. She did not receive a call from pulmonology. She complains of intermittent sharp pain in her right shoulder and sometimes radiating to her neck and back of head. She continues being on 3L of oxygen via [place] but continues having SOB even with short trips to the bathroom. She denies having hemoptysis.  She is scheduled to see pulmonology on 11/1.  REVIEW OF SYSTEMS:  Review of Systems  Constitutional: Positive for appetite change (depleted) and fatigue (depleted).  Respiratory: Positive for shortness of breath (w/ exertion). Negative for hemoptysis.   Gastrointestinal: Positive for nausea.  Musculoskeletal: Positive for arthralgias (6/10 L shoulder pain).  Neurological: Positive for dizziness and headaches.  Psychiatric/Behavioral: The patient is nervous/anxious.   All other systems reviewed and are negative.   PAST MEDICAL/SURGICAL HISTORY:  Past Medical History:  Diagnosis Date  . Acute respiratory failure with hypoxia ([facility])   . Anxiety   . Cancer (HCC)    uvular per pt  . COPD (chronic obstructive pulmonary disease) ([facility])   . DDD (degenerative disc disease), lumbar   . Degenerative  joint disease of spine   . Diverticulosis   . GERD (gastroesophageal reflux disease)   . H. pylori infection    treated 02/2013.  [name] History of UTI   . Hypercholesterolemia   . Hypertension   . Hyponatremia   . Peripheral neuropathy   . Shortness of breath    with exertion  . Tobacco abuse    Past Surgical History:  Procedure Laterality Date  . ABDOMINAL EXPLORATION SURGERY     age 17, large ovarian cyst  . BIOPSY N/A 03/01/2013   UUE:[facility] hiatal hernia; otherwise, normal examination/ Status post biopsies as described above. SB bx negative for Celiac. +h/pylori  . CATARACT EXTRACTION W/ INTRAOCULAR LENS  IMPLANT, BILATERAL    . COLONOSCOPY  10/16/09   Jenkins:3 polypsin the sigmoid colon/polyps in the descending colon and the rectum/scattered diverticulum. hyperplastic  . COLONOSCOPY WITH ESOPHAGOGASTRODUODENOSCOPY (EGD) N/A 03/01/2013   LKJ:ZPHXTAVW colonic polyps - treated/removed as described above. Colonic diverticulosis. Hyperplastic. Next TCS 02/2018.  Gastric biopsy showed H. pylori.  She completed treatment.  [name] DIRECT LARYNGOSCOPY N/A 06/08/2016   Procedure: DIRECT LARYNGOSCOPY AND BIOPSY;  Surgeon: Leta Baptist, MD;  Location: [facility] OR;  Service: ENT;  Laterality: N/A;  . PARTIAL HYSTERECTOMY     age 16  . UVULECTOMY      SOCIAL HISTORY:  Social History   Socioeconomic History  . Marital status: Divorced    Spouse name: Not on file  . Number of children: 4  . Years of education: Not on file  . Highest education level: Not on file  Occupational History  .  Not on file  Tobacco Use  . Smoking status: Former Smoker    Packs/day: 1.50    Years: 50.00    Pack years: 75.00    Types: Cigarettes    Quit date: 07/16/2018    Years since quitting: 1.9  . Smokeless tobacco: Never Used  Vaping Use  . Vaping Use: Never used  Substance and Sexual Activity  . Alcohol use: Not Currently    Comment: glass of wine occasionally  . Drug use: No  . Sexual activity: Not on file  Other  Topics Concern  . Not on file  Social History Narrative  . Not on file   Social Determinants of Health   Financial Resource Strain:   . Difficulty of Paying Living Expenses: Not on file  Food Insecurity:   . Worried About Charity fundraiser in the Last Year: Not on file  . Ran Out of Food in the Last Year: Not on file  Transportation Needs:   . Lack of Transportation (Medical): Not on file  . Lack of Transportation (Non-Medical): Not on file  Physical Activity:   . Days of Exercise per Week: Not on file  . Minutes of Exercise per Session: Not on file  Stress:   . Feeling of Stress : Not on file  Social Connections:   . Frequency of Communication with Friends and Family: Not on file  . Frequency of Social Gatherings with Friends and Family: Not on file  . Attends Religious Services: Not on file  . Active Member of Clubs or Organizations: Not on file  . Attends Archivist Meetings: Not on file  . Marital Status: Not on file  Intimate Partner Violence:   . Fear of Current or Ex-Partner: Not on file  . Emotionally Abused: Not on file  . Physically Abused: Not on file  . Sexually Abused: Not on file    FAMILY HISTORY:  Family History  Problem Relation Age of Onset  . Leukemia Father   . Thyroid disease Father   . Stomach cancer Paternal Grandfather   . Colon cancer Neg Hx     CURRENT MEDICATIONS:  Current Outpatient Medications  Medication Sig Dispense Refill  . ascorbic acid (VITAMIN C) 500 MG tablet Take 500 mg by mouth daily.    . ferrous sulfate 325 (65 FE) MG tablet Take 325 mg by mouth 2 (two) times daily with a meal.    . ondansetron (ZOFRAN) 4 MG tablet Take 4 mg by mouth every 6 (six) hours as needed for nausea or vomiting.    . zinc gluconate 50 MG tablet Take 50 mg by mouth daily.    [name] acetaminophen (TYLENOL) 325 MG tablet Take 650 mg by mouth every 4 (four) hours as needed.     [name] albuterol (PROVENTIL, VENTOLIN) (5 MG/ML) 0.5% NEBU Take 1.25 mg by  nebulization 3 (three) times daily.     [name] albuterol (VENTOLIN HFA) 108 (90 Base) MCG/ACT inhaler Inhale 1-2 puffs into the lungs every 6 (six) hours as needed for wheezing or shortness of breath. 8 g 0  . ALPRAZolam (XANAX) 0.5 MG tablet Take 1 tablet by mouth 2 (two) times daily.     [name] amLODipine (NORVASC) 5 MG tablet TAKE 1 TABLET BY MOUTH ONCE A DAY. 30 tablet 1  . atorvastatin (LIPITOR) 40 MG tablet Take 40 mg by mouth daily.    . busPIRone (BUSPAR) 7.5 MG tablet Take 1 tablet by mouth 2 (two) times a day.    [name]  Carboxymethylcellulose Sodium (ARTIFICIAL TEARS OP) Apply to eye as needed.    . Ergocalciferol (VITAMIN D2) 10 MCG (400 UNIT) TABS Take 1 tablet by mouth daily.    . fluticasone (FLONASE) 50 MCG/ACT nasal spray Place 1 spray into both nostrils daily.    . fluticasone furoate-vilanterol (BREO ELLIPTA) 100-25 MCG/INH AEPB Inhale 1 puff into the lungs daily.    [name] loratadine (CLARITIN) 10 MG tablet Take 10 mg by mouth daily.    . melatonin 5 MG TABS 10 mg at bedtime.     . Pseudoephedrine-Guaifenesin (MUCINEX D MAX STRENGTH) [phone] MG TB12 Take by mouth as needed.     . traMADol (ULTRAM) 50 MG tablet Take 50 mg by mouth as needed.     . triamcinolone cream (KENALOG) 0.5 % Apply 1 application topically 2 (two) times daily. As needed     No current facility-administered medications for this visit.    ALLERGIES:  Allergies  Allergen Reactions  . Prednisone Nausea Only  . Chantix [Varenicline] Other (See Comments)    Mental status changes  . Codeine Itching    PHYSICAL EXAM:  Performance status (ECOG): 1 - Symptomatic but completely ambulatory  Vitals:   06/29/20 1121  BP: (!) 135/53  Pulse: 88  Resp: 18  Temp: (!) 97.1 F (36.2 C)  SpO2: 100%   Wt Readings from Last 3 Encounters:  06/29/20 146 lb 12.8 oz (66.6 kg)  04/06/20 143 lb 12.8 oz (65.2 kg)  03/09/20 143 lb 14.4 oz (65.3 kg)   Physical Exam Vitals reviewed.  Constitutional:      Appearance: Normal  appearance.     Interventions: Nasal cannula in place.  Cardiovascular:     Rate and Rhythm: Normal rate and regular rhythm.     Pulses: Normal pulses.     Heart sounds: Normal heart sounds.  Pulmonary:     Effort: Pulmonary effort is normal.     Breath sounds: Normal breath sounds.  Neurological:     General: No focal deficit present.     Mental Status: She is alert and oriented to person, place, and time.  Psychiatric:        Mood and Affect: Mood normal.        Behavior: Behavior normal.      LABORATORY DATA:  I have reviewed the labs as listed.  CBC Latest Ref Rng & Units 06/29/2020 04/06/2020 03/09/2020  WBC 4.0 - 10.5 K/uL 12.0(H) 11.3(H) 10.2  Hemoglobin 12.0 - 15.0 g/dL 10.5(L) 10.0(L) 10.5(L)  Hematocrit 36 - 46 % 32.7(L) 32.1(L) 34.1(L)  Platelets 150 - 400 K/uL 284 263 306   CMP Latest Ref Rng & Units 06/29/2020 04/06/2020 03/09/2020  Glucose 70 - 99 mg/dL 102(H) 117(H) 109(H)  BUN 8 - 23 mg/dL 12 10 13   Creatinine 0.44 - 1.00 mg/dL 0.71 0.61 0.62  Sodium 135 - 145 mmol/L 138 139 139  Potassium 3.5 - 5.1 mmol/L 4.9 4.6 4.3  Chloride 98 - 111 mmol/L 98 100 100  CO2 22 - 32 mmol/L 31 30 29   Calcium 8.9 - 10.3 mg/dL 10.0 9.8 10.0  Total Protein 6.5 - 8.1 g/dL 6.6 6.8 7.2  Total Bilirubin 0.3 - 1.2 mg/dL 0.7 0.5 0.4  Alkaline Phos 38 - 126 U/L 90 81 77  AST 15 - 41 U/L 14(L) 14(L) 17  ALT 0 - 44 U/L 13 15 17    Lab Results  Component Value Date   TIBC 310 06/29/2020   TIBC 309 04/06/2020   TIBC  337 03/09/2020   FERRITIN 177 06/29/2020   FERRITIN 116 04/06/2020   FERRITIN 135 03/09/2020   IRONPCTSAT 33 (H) 06/29/2020   IRONPCTSAT 29 04/06/2020   IRONPCTSAT 24 03/09/2020    DIAGNOSTIC IMAGING:  I have independently reviewed the scans and discussed with the patient. No results found.   ASSESSMENT:  1. 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 Lilli Light [facility]. Pathology shows margins negative, p16 was positive. -PET  scan on 08/30/2019 shows mild asymmetry along the oropharynx without CT correlate. -PET scan on 03/27/2020 did not show any evidence of recurrence.  2.  Left lower lobe pulmonary nodule: -She quit smoking in October 2019. -CT chest with contrast from 08/16/2019 shows new solid 1.4 x 1.1 cm left upper lobe nodule abutting the major fissure. New 2.3 x 2.0 airspace opacity in the right middle lobe with several other small nodules. -PET scan on 08/30/2019 shows some of the new/progressive solid nodular and subsolid opacities in the lungs shown on the CT from 88/41/6606 are hypermetabolic including left upper lobe nodule near the fissure, bandlike nodularity in the right middle lobe and subsolid nodularity posteriorly in the right upper lobe. -PET scan on 03/27/2020 shows left lower lobe pulmonary nodule associated with occlusion of the left lower lobe bronchus, enlarged measuring 2.2 x 1.8 cm, SUV 32.3.  Right upper lobe bandlike density has improved.  Lesion in the right middle lobe has resolved.  Previously hypermetabolic nodule posteriorly in the left upper lobe has resolved.  3.Anemia of chronic inflammation: -Labs on 09/03/2019 shows hemoglobin 10, MCV 94.5 with normal white count and platelet count. -Colonoscopy and EGD were on 03/01/2013.   PLAN:  1. 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  2.  Left lower lobe pulmonary nodule: -She has not seen pulmonary yet.  She has an appointment with Dr. Melvyn Novas in the first week of November. -I have recommended restaging PET CT scan as it has been 3 months from the last scan. -I will see her back after the biopsy.  3.Anemia of chronic inflammation: -Hemoglobin today is 10.5.  Ferritin is 177 and percent saturation is 33. -T01 and folic acid was normal.  No parenteral iron therapy needed.   Orders placed this encounter:  No orders of the defined types were placed in this  encounter.    Derek Jack, MD [place] [phone]   I, Milinda Antis, am acting as a scribe for Dr. Sanda Linger.  I, Derek Jack MD, have reviewed the above documentation for accuracy and completeness, and I agree with the above.

## 2020-07-07 ENCOUNTER — Other Ambulatory Visit: Payer: Self-pay

## 2020-07-07 ENCOUNTER — Ambulatory Visit (HOSPITAL_COMMUNITY)
Admission: RE | Admit: 2020-07-07 | Discharge: 2020-07-07 | Disposition: A | Discharge status: Home / Self Care | Payer: Medicare Other | Source: Ambulatory Visit | Attending: Hematology | Admitting: Hematology

## 2020-07-07 DIAGNOSIS — I2510 Atherosclerotic heart disease of native coronary artery without angina pectoris: Secondary | ICD-10-CM | POA: Diagnosis not present

## 2020-07-07 DIAGNOSIS — J439 Emphysema, unspecified: Secondary | ICD-10-CM | POA: Diagnosis not present

## 2020-07-07 DIAGNOSIS — C052 Malignant neoplasm of uvula: Secondary | ICD-10-CM

## 2020-07-07 DIAGNOSIS — I700 Atherosclerosis of aorta: Secondary | ICD-10-CM | POA: Diagnosis not present

## 2020-07-07 LAB — GLUCOSE, CAPILLARY: Glucose-Capillary: 102 mg/dL — ABNORMAL HIGH (ref 70–99)

## 2020-07-07 MED ORDER — FLUDEOXYGLUCOSE F - 18 (FDG) INJECTION
7.2800 | Freq: Once | INTRAVENOUS | Status: AC
  Administered 2020-07-07: 7.28 via INTRAVENOUS

## 2020-07-08 ENCOUNTER — Telehealth: Payer: Self-pay | Admitting: Internal Medicine

## 2020-07-08 ENCOUNTER — Encounter (HOSPITAL_COMMUNITY): Payer: Self-pay | Admitting: Lab

## 2020-07-08 NOTE — Progress Notes (Signed)
Contacted Dr. Gustavus Bryant office to see if patient's 07/27/20 appointment could be moved up.  Request sent for review.

## 2020-07-09 NOTE — Telephone Encounter (Signed)
ATC Dr. Delton Coombes, there was no answer and no option to leave a message. We have never seen this pt, she is scheduled for a consult with MW on 07/27/20 at 1030.

## 2020-07-14 NOTE — Telephone Encounter (Signed)
PCCM:  I received a message about trying to get patient worked in for procedure.  She has an appt with Dr. Melvyn Novas on 11/1 to discuss next steps  Garner Nash, DO [facility] 07/14/2020 9:45 AM

## 2020-07-14 NOTE — Telephone Encounter (Signed)
Called and spoke to rep at [facility]. Dr. Delton Coombes requested pt be seen sooner than 11/1 consult. Spoke with Dr. Valeta Harms, whom spoke with pt's son, and inquired if pt would be willing for biopsy. Pts son denies pt's willingness for biopsy and they would like to wait till 11/1 appt. Nothing further needed at this time. Will sign off.

## 2020-07-27 ENCOUNTER — Telehealth: Payer: Self-pay | Admitting: Internal Medicine

## 2020-07-27 ENCOUNTER — Other Ambulatory Visit: Payer: Self-pay

## 2020-07-27 ENCOUNTER — Encounter: Payer: Self-pay | Admitting: Internal Medicine

## 2020-07-27 ENCOUNTER — Ambulatory Visit (INDEPENDENT_AMBULATORY_CARE_PROVIDER_SITE_OTHER): Payer: Medicare Other | Admitting: Internal Medicine

## 2020-07-27 DIAGNOSIS — R918 Other nonspecific abnormal finding of lung field: Secondary | ICD-10-CM | POA: Insufficient documentation

## 2020-07-27 DIAGNOSIS — J449 Chronic obstructive pulmonary disease, unspecified: Secondary | ICD-10-CM

## 2020-07-27 DIAGNOSIS — J9611 Chronic respiratory failure with hypoxia: Secondary | ICD-10-CM | POA: Diagnosis not present

## 2020-07-27 MED ORDER — ANORO ELLIPTA 62.5-25 MCG/INH IN AEPB: 1.0000 | INHALATION_SPRAY | Freq: Every day | RESPIRATORY_TRACT | 11 refills | Status: AC

## 2020-07-27 NOTE — Assessment & Plan Note (Signed)
Quit smoking 2019 - changed breo to anoro 07/27/2020   Pt is Group B in terms of symptom/risk and laba/lama therefore appropriate rx at this point >>> anoro trial rec and f/u in 3 m, sooner if needed

## 2020-07-27 NOTE — Assessment & Plan Note (Addendum)
PET  Pos 07/07/20 but declined bx   Discussed in detail all the  indications, usual  risks and alternatives  relative to the benefits with patient who agrees to proceed with conservative f/u prn worse symptoms might consider palliative approach at that point but she's not a candidate for surgery and does not want any form of chemo or RT so nothing else to offer at this point          Each maintenance medication was reviewed in detail including emphasizing most importantly the difference between maintenance and prns and under what circumstances the prns are to be triggered using an action plan format where appropriate.  Total time for H and P, chart review, counseling, teaching elipta  device  directly observing portions of ambulatory 02 saturation study/  and generating customized AVS unique to this office visit / charting = 63 min

## 2020-07-27 NOTE — Telephone Encounter (Signed)
Spoke to [employer] with [facility], who is requesting Rx for Anoro. Rx for Anoro has been sent to preferred pharmacy. Nothing further needed.

## 2020-07-27 NOTE — Assessment & Plan Note (Signed)
07/27/2020   Walked 2lpm pulsed   approx   100 ft  @ slow  pace  stopped due to  desat resolved  on 3lpm but then legs tired with ok sats p 150 ft     rec as of 07/27/2020   3lpm sleeping,  2lpm rest cont or pulsed, with walking > across the room should use 3lpm pulsed

## 2020-07-27 NOTE — Progress Notes (Signed)
Lindsey Combs, female    DOB: July 21, 1946,     MRN: [id number]   Brief patient profile:  5 yowf quit smoking 2019 with dx of copd in 2007 in background of bronchitis since early 20s  doe x 75 ft x 2019 no better since quit smoking despite BREO so referred to pulmonary clinic in [place]  07/27/2020 by Dr  Lindsey Combs     History of Present Illness  07/27/2020  Pulmonary/ 1st office eval/ Lindsey Combs / Lindsey Combs Office  Chief Complaint  Patient presents with   Consult    productive cough with white phlegm, shortness of breath with exertion  Dyspnea:  Hallway at AL 150 ft and she sits rests, and able to do the second half  Cough: minimal mostly mucoid  Sleep: bed is flat / two pillows  SABA use: proair rarely uses it  Maybe up twice daily/ thinks spiriva worst  02 :  Sleeping on 3lpm and sitting 2 and waling DR on 3lpm   Had  PET 07/07/20 but declined bx   No obvious day to day or daytime variability or assoc excess/ purulent sputum or mucus plugs or hemoptysis or cp or chest tightness, subjective wheeze or overt sinus or hb symptoms.   Sleeping  without nocturnal  or early am exacerbation  of respiratory  c/o's or need for noct saba. Also denies any obvious fluctuation of symptoms with weather or environmental changes or other aggravating or alleviating factors except as outlined above   No unusual exposure hx or h/o childhood pna/ asthma or knowledge of premature birth.  Current Allergies, Complete Past Medical History, Past Surgical History, Family History, and Social History were reviewed in [employer] record.  ROS  The following are not active complaints unless bolded Hoarseness, sore throat, dysphagia, dental problems, itching, sneezing,  nasal congestion or discharge of excess mucus or purulent secretions, ear ache,   fever, chills, sweats, unintended wt loss or wt gain, classically pleuritic or exertional cp,  orthopnea pnd or arm/hand swelling  or leg  swelling, presyncope, palpitations, abdominal pain, anorexia, nausea, vomiting, diarrhea  or change in bowel habits or change in bladder habits, change in stools or change in urine, dysuria, hematuria,  rash, arthralgias, visual complaints, headache, numbness, weakness or ataxia or problems with walking or coordination,  change in mood or  memory.           Past Medical History:  Diagnosis Date   Acute respiratory failure with hypoxia (HCC)    Anxiety    Cancer (HCC)    uvular per pt   COPD (chronic obstructive pulmonary disease) (HCC)    DDD (degenerative disc disease), lumbar    Degenerative joint disease of spine    Diverticulosis    GERD (gastroesophageal reflux disease)    H. pylori infection    treated 02/2013.   History of UTI    Hypercholesterolemia    Hypertension    Hyponatremia    Peripheral neuropathy    Shortness of breath    with exertion   Tobacco abuse     Outpatient Medications Prior to Visit  Medication Sig Dispense Refill   acetaminophen (TYLENOL) 325 MG tablet Take 650 mg by mouth every 4 (four) hours as needed.      albuterol (PROVENTIL, VENTOLIN) (5 MG/ML) 0.5% NEBU Take 1.25 mg by nebulization 3 (three) times daily.      albuterol (VENTOLIN HFA) 108 (90 Base) MCG/ACT inhaler Inhale 1-2 puffs into the lungs  every 6 (six) hours as needed for wheezing or shortness of breath. 8 g 0   ALPRAZolam (XANAX) 0.5 MG tablet Take 1 tablet by mouth 2 (two) times daily.      amLODipine (NORVASC) 5 MG tablet TAKE 1 TABLET BY MOUTH ONCE A DAY. 30 tablet 1   ascorbic acid (VITAMIN C) 500 MG tablet Take 500 mg by mouth daily.     atorvastatin (LIPITOR) 40 MG tablet Take 40 mg by mouth daily.     busPIRone (BUSPAR) 7.5 MG tablet Take 1 tablet by mouth 2 (two) times a day.     Carboxymethylcellulose Sodium (ARTIFICIAL TEARS OP) Apply to eye as needed.     Ergocalciferol (VITAMIN D2) 10 MCG (400 UNIT) TABS Take 1 tablet by mouth daily.     ferrous  sulfate 325 (65 FE) MG tablet Take 325 mg by mouth 2 (two) times daily with a meal.     fluticasone (FLONASE) 50 MCG/ACT nasal spray Place 1 spray into both nostrils daily.     fluticasone furoate-vilanterol (BREO ELLIPTA) 100-25 MCG/INH AEPB Inhale 1 puff into the lungs daily.     loratadine (CLARITIN) 10 MG tablet Take 10 mg by mouth daily.     melatonin 5 MG TABS 10 mg at bedtime.      ondansetron (ZOFRAN) 4 MG tablet Take 4 mg by mouth every 6 (six) hours as needed for nausea or vomiting.     Pseudoephedrine-Guaifenesin (MUCINEX D MAX STRENGTH) [phone] MG TB12 Take by mouth as needed.      traMADol (ULTRAM) 50 MG tablet Take 50 mg by mouth as needed.      triamcinolone cream (KENALOG) 0.5 % Apply 1 application topically 2 (two) times daily. As needed     zinc gluconate 50 MG tablet Take 50 mg by mouth daily.     No facility-administered medications prior to visit.     Objective:     BP 120/62 (BP Location: Left Arm, Cuff Size: Normal)    Pulse 89    Temp 97.8 F (36.6 C) (Other (Comment)) Comment (Src): wrist   Ht 5\' 5"  (1.651 m)    Wt 146 lb 12.8 oz (66.6 kg)    SpO2 100% Comment: 3L Pulse O2   BMI 24.43 kg/m   SpO2: 100 % (3L Pulse O2) O2 Type: Pulse O2 O2 Flow Rate (L/min): 3 L/min   Elderly wf nad    HEENT : pt wearing mask not removed for exam due to covid - 19 concerns.    NECK :  without JVD/Nodes/TM/ nl carotid upstrokes bilaterally   LUNGS: no acc muscle use,  Mild barrel  contour chest wall with bilateral  Distant bs s audible wheeze and  without cough on insp or exp maneuvers  and mild  Hyperresonant  to  percussion bilaterally     CV:  RRR  no s3 or murmur or increase in P2, and no edema   ABD:  soft and nontender with pos end  insp Hoover's  in the supine position. No bruits or organomegaly appreciated, bowel sounds nl  MS:   Nl gait/  ext warm without deformities, calf tenderness, cyanosis or clubbing No obvious joint restrictions   SKIN: warm  and dry without lesions    NEURO:  alert, approp, nl sensorium with  no motor or cerebellar deficits apparent.         I personally reviewed images and agree with radiology impression as follows:   Chest CT PET 07/07/20  1. Interval increase in size of the right lung perihilar lung mass compatible with progression of disease. 2. New 7 mm lung nodule is identified within the right lower lobe. Nonspecific. Cannot rule out metastatic disease.       Assessment   COPD GOLD ? / 02 dep  Quit smoking 2019 - changed breo to anoro 07/27/2020   Pt is Group B in terms of symptom/risk and laba/lama therefore appropriate rx at this point >>> anoro trial rec and f/u in 3 m, sooner if needed       Chronic respiratory failure with hypoxia ([place]) 07/27/2020   Walked 2lpm pulsed   approx   100 ft  @ slow  pace  stopped due to  desat resolved  on 3lpm but then legs tired with ok sats p 150 ft     rec as of 07/27/2020   3lpm sleeping,  2lpm rest cont or pulsed, with walking > across the room should use 3lpm pulsed     Mass of right lung  PET positive 07/07/20 but declined bx   Discussed in detail all the  indications, usual  risks and alternatives  relative to the benefits with patient who agrees to proceed with conservative f/u prn worse symptoms might consider palliative approach at that point but she's not a candidate for surgery and does not want any form of chemo or RT so nothing else to offer at this point          Each maintenance medication was reviewed in detail including emphasizing most importantly the difference between maintenance and prns and under what circumstances the prns are to be triggered using an action plan format where appropriate.  Total time for H and P, chart review, counseling, teaching elipta  device  directly observing portions of ambulatory 02 saturation study/  and generating customized AVS unique to this office visit / charting = 63 min          Christinia Gully,  MD 07/27/2020

## 2020-07-27 NOTE — Patient Instructions (Addendum)
.  Make sure you check your oxygen saturations at highest level of activity to be sure it stays over 90% and adjust  02 flow upward to maintain this level if needed but remember to turn it back to previous settings when you stop (to conserve your supply).   I recommend you change Breo to Anoro one click in  AM  Only use your albuterol (PROAIR)  as a rescue medication to be used if you can't catch your breath by resting or doing a relaxed purse lip breathing pattern.  - The less you use it, the better it will work when you need it. - Ok to use up to 2 puffs  every 4 hours if you must but call for immediate appointment if use goes up over your usual need - Don't leave home without it !!  (think of it like the spare tire for your car)    Please schedule a follow up visit in 6 months but call sooner if needed

## 2020-08-10 ENCOUNTER — Other Ambulatory Visit (HOSPITAL_COMMUNITY): Payer: Self-pay

## 2020-08-10 ENCOUNTER — Inpatient Hospital Stay (HOSPITAL_COMMUNITY): Payer: Medicare Other | Admitting: Hematology

## 2020-08-10 DIAGNOSIS — C052 Malignant neoplasm of uvula: Secondary | ICD-10-CM

## 2020-08-10 DIAGNOSIS — D509 Iron deficiency anemia, unspecified: Secondary | ICD-10-CM

## 2020-08-10 DIAGNOSIS — R911 Solitary pulmonary nodule: Secondary | ICD-10-CM

## 2020-08-10 NOTE — Progress Notes (Signed)
Orders placed for CBC, CMP, Iron Panel and Ferritin per Dr. Delton Coombes.

## 2020-09-07 ENCOUNTER — Emergency Department (HOSPITAL_COMMUNITY): Payer: Medicare Other

## 2020-09-07 ENCOUNTER — Other Ambulatory Visit: Payer: Self-pay

## 2020-09-07 ENCOUNTER — Emergency Department (HOSPITAL_COMMUNITY)
Admission: EM | Admit: 2020-09-07 | Discharge: 2020-09-07 | Disposition: A | Discharge status: Home / Self Care | Payer: Medicare Other | Attending: Emergency Medicine | Admitting: Emergency Medicine

## 2020-09-07 ENCOUNTER — Encounter (HOSPITAL_COMMUNITY): Payer: Self-pay | Admitting: *Deleted

## 2020-09-07 DIAGNOSIS — Z87891 Personal history of nicotine dependence: Secondary | ICD-10-CM | POA: Insufficient documentation

## 2020-09-07 DIAGNOSIS — Z20822 Contact with and (suspected) exposure to covid-19: Secondary | ICD-10-CM | POA: Insufficient documentation

## 2020-09-07 DIAGNOSIS — Z7952 Long term (current) use of systemic steroids: Secondary | ICD-10-CM | POA: Diagnosis not present

## 2020-09-07 DIAGNOSIS — D72829 Elevated white blood cell count, unspecified: Secondary | ICD-10-CM | POA: Insufficient documentation

## 2020-09-07 DIAGNOSIS — Z79899 Other long term (current) drug therapy: Secondary | ICD-10-CM | POA: Insufficient documentation

## 2020-09-07 DIAGNOSIS — R059 Cough, unspecified: Secondary | ICD-10-CM | POA: Diagnosis present

## 2020-09-07 DIAGNOSIS — J441 Chronic obstructive pulmonary disease with (acute) exacerbation: Secondary | ICD-10-CM | POA: Diagnosis not present

## 2020-09-07 DIAGNOSIS — Z8544 Personal history of malignant neoplasm of other female genital organs: Secondary | ICD-10-CM | POA: Insufficient documentation

## 2020-09-07 DIAGNOSIS — R110 Nausea: Secondary | ICD-10-CM | POA: Diagnosis not present

## 2020-09-07 DIAGNOSIS — I10 Essential (primary) hypertension: Secondary | ICD-10-CM | POA: Diagnosis not present

## 2020-09-07 HISTORY — DX: Anemia, unspecified: D64.9

## 2020-09-07 LAB — CBC WITH DIFFERENTIAL/PLATELET
Abs Immature Granulocytes: 0.06 10*3/uL (ref 0.00–0.07)
Basophils Absolute: 0.1 10*3/uL (ref 0.0–0.1)
Basophils Relative: 0 %
Eosinophils Absolute: 0.1 10*3/uL (ref 0.0–0.5)
Eosinophils Relative: 1 %
HCT: 36 % (ref 36.0–46.0)
Hemoglobin: 11 g/dL — ABNORMAL LOW (ref 12.0–15.0)
Immature Granulocytes: 1 %
Lymphocytes Relative: 13 %
Lymphs Abs: 1.5 10*3/uL (ref 0.7–4.0)
MCH: 28.6 pg (ref 26.0–34.0)
MCHC: 30.6 g/dL (ref 30.0–36.0)
MCV: 93.8 fL (ref 80.0–100.0)
Monocytes Absolute: 0.6 10*3/uL (ref 0.1–1.0)
Monocytes Relative: 5 %
Neutro Abs: 9.3 10*3/uL — ABNORMAL HIGH (ref 1.7–7.7)
Neutrophils Relative %: 80 %
Platelets: 290 10*3/uL (ref 150–400)
RBC: 3.84 MIL/uL — ABNORMAL LOW (ref 3.87–5.11)
RDW: 13.1 % (ref 11.5–15.5)
WBC: 11.6 10*3/uL — ABNORMAL HIGH (ref 4.0–10.5)
nRBC: 0 % (ref 0.0–0.2)

## 2020-09-07 LAB — RESP PANEL BY RT-PCR (FLU A&B, COVID) ARPGX2
Influenza A by PCR: NEGATIVE
Influenza B by PCR: NEGATIVE
SARS Coronavirus 2 by RT PCR: NEGATIVE

## 2020-09-07 LAB — COMPREHENSIVE METABOLIC PANEL
ALT: 17 U/L (ref 0–44)
AST: 17 U/L (ref 15–41)
Albumin: 4.3 g/dL (ref 3.5–5.0)
Alkaline Phosphatase: 87 U/L (ref 38–126)
Anion gap: 8 (ref 5–15)
BUN: 10 mg/dL (ref 8–23)
CO2: 31 mmol/L (ref 22–32)
Calcium: 9.8 mg/dL (ref 8.9–10.3)
Chloride: 97 mmol/L — ABNORMAL LOW (ref 98–111)
Creatinine, Ser: 0.69 mg/dL (ref 0.44–1.00)
GFR, Estimated: >60 mL/min (ref >60)
Glucose, Bld: 134 mg/dL — ABNORMAL HIGH (ref 70–99)
Potassium: 3.7 mmol/L (ref 3.5–5.1)
Sodium: 136 mmol/L (ref 135–145)
Total Bilirubin: 0.5 mg/dL (ref 0.3–1.2)
Total Protein: 7.1 g/dL (ref 6.5–8.1)

## 2020-09-07 MED ORDER — ALBUTEROL SULFATE (2.5 MG/3ML) 0.083% IN NEBU
INHALATION_SOLUTION | RESPIRATORY_TRACT | Status: AC
  Administered 2020-09-07: 14:00:00 2.5 mg
  Filled 2020-09-07: qty 3

## 2020-09-07 MED ORDER — IPRATROPIUM-ALBUTEROL 0.5-2.5 (3) MG/3ML IN SOLN
3.0000 mL | Freq: Once | RESPIRATORY_TRACT | Status: AC
  Administered 2020-09-07: 14:00:00 3 mL via RESPIRATORY_TRACT
  Filled 2020-09-07: qty 3

## 2020-09-07 MED ORDER — ALBUTEROL (5 MG/ML) CONTINUOUS INHALATION SOLN
10.0000 mg/h | INHALATION_SOLUTION | RESPIRATORY_TRACT | Status: AC
  Administered 2020-09-07: 15:00:00 10 mg/h via RESPIRATORY_TRACT
  Filled 2020-09-07: qty 20

## 2020-09-07 MED ORDER — AZITHROMYCIN 500 MG PO TABS: 500.0000 mg | ORAL_TABLET | Freq: Every day | ORAL | 0 refills | Status: AC

## 2020-09-07 MED ORDER — PREDNISONE 50 MG PO TABS
60.0000 mg | ORAL_TABLET | Freq: Once | ORAL | Status: AC
  Administered 2020-09-07: 14:00:00 60 mg via ORAL
  Filled 2020-09-07: qty 1

## 2020-09-07 MED ORDER — PREDNISONE 20 MG PO TABS: 40.0000 mg | ORAL_TABLET | Freq: Every day | ORAL | 0 refills | Status: AC

## 2020-09-07 MED ORDER — ONDANSETRON HCL 4 MG/2ML IJ SOLN: 4.0000 mg | Freq: Once | INTRAMUSCULAR | Status: DC | PRN

## 2020-09-07 MED ORDER — ALBUTEROL SULFATE HFA 108 (90 BASE) MCG/ACT IN AERS
4.0000 | INHALATION_SPRAY | Freq: Once | RESPIRATORY_TRACT | Status: AC
  Administered 2020-09-07: 13:00:00 4 via RESPIRATORY_TRACT
  Filled 2020-09-07: qty 6.7

## 2020-09-07 NOTE — ED Notes (Signed)
Report called to [facility], [phone] talked with supervisor Charleston Ropes, reports given and d/c instructions reviewed, Charleston Ropes states that they will send someone to come get her.

## 2020-09-07 NOTE — ED Provider Notes (Signed)
[facility] Provider Note   CSN: [id number] Arrival date & time: 09/07/20  1121     History Chief Complaint  Patient presents with  . Cough    Lindsey Combs is a 74 y.o. female-past medical history of COPD, acid reflux, hypertension, tobacco abuse that presents to the emergency department today for cough and head congestion started yesterday brought in by EMS.  Patient is in assisted long-term facility at [facility].  She states that yesterday she did not feel so well, felt as if she was having increased shortness of breath whenever she moved.  States that she was also producing some sputum whenever she coughed, this is a new cough which is different from her COPD cough.  Patient states that she wears 3 L of oxygen daily, EMS did increase it to 4 L and patient states that her shortness of breath is much improved.  Denies any chest pain or myalgias.  Denies any headache.  Denies any fevers or chills.  Does admit to some slight nausea, no vomiting.  No diarrhea.  Has been vaccinated against Covid.  Denies any history of heart failure.  Denies any weight changes.  States that she has been taking her albuterol at home which has been helping her.  Does feel as if this is a COPD exacerbation.  HPI     Past Medical History:  Diagnosis Date  . Acute respiratory failure with hypoxia ([place])   . Anemia   . Anxiety   . Cancer (HCC)    uvular per pt  . COPD (chronic obstructive pulmonary disease) ([place])   . DDD (degenerative disc disease), lumbar   . Degenerative joint disease of spine   . Diverticulosis   . GERD (gastroesophageal reflux disease)   . H. pylori infection    treated 02/2013.  [name] History of UTI   . Hypercholesterolemia   . Hypertension   . Hyponatremia   . Peripheral neuropathy   . Shortness of breath    with exertion  . Tobacco abuse     Patient Active Problem List   Diagnosis Date Noted  . Chronic respiratory failure with hypoxia ([place]) 07/27/2020   . Mass of right lung 07/27/2020  . PAD (peripheral artery disease) ([place]) 08/13/2019  . H/O colonoscopy with polypectomy 04/25/2019  . Acute on chronic respiratory failure with hypoxia ([place]) 05/04/2018  . Severe protein-calorie malnutrition ([place]) 05/04/2018  . Closed right hip fracture ([place]) greater Trochanter 07/13/17 07/13/2017  . Vomiting and diarrhea 07/13/2017  . Fall at home, initial encounter 07/13/2017  . Hip fracture ([place]) 07/13/2017  . COPD with acute exacerbation ([place]) 02/16/2017  . Tobacco abuse 02/16/2017  . Malnutrition of moderate degree 09/22/2016  . Hypomagnesemia 09/21/2016  . Tobacco use 08/03/2016  . Hyperlipidemia 08/03/2016  . Hypertension 08/03/2016  . History of Helicobacter pylori infection 08/03/2016  . History of anemia 08/03/2016  . Diverticulosis 08/03/2016  . Arthritis 08/03/2016  . 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 Chronic obstructive pulmonary disease with acute exacerbation ([place])   . Acute respiratory failure with hypoxia ([place]) 04/03/2015  . COPD exacerbation ([place]) 04/03/2015  . Hyponatremia 04/03/2015  . Dehydration 04/03/2015  . Hyperglycemia 04/03/2015  . Anxiety   . COPD GOLD ? / 02 dep    . Peripheral neuropathy ([place])   . Tobacco dependence   . Back pain, chronic 04/19/2013  . Abnormal weight loss 02/19/2013  . Anemia, iron deficiency 02/19/2013  . GERD (gastroesophageal reflux disease) 02/19/2013  Past Surgical History:  Procedure Laterality Date  . ABDOMINAL EXPLORATION SURGERY     age 67, large ovarian cyst  . BIOPSY N/A 03/01/2013   SHF:WYOV hiatal hernia; otherwise, normal examination/ Status post biopsies as described above. SB bx negative for Celiac. +h/pylori  . CATARACT EXTRACTION W/ INTRAOCULAR LENS  IMPLANT, BILATERAL    . COLONOSCOPY  10/16/09   Jenkins:3 polypsin the sigmoid colon/polyps in the descending colon and the rectum/scattered diverticulum. hyperplastic  . COLONOSCOPY WITH ESOPHAGOGASTRODUODENOSCOPY (EGD)  N/A 03/01/2013   ZCH:YIFOYDXA colonic polyps - treated/removed as described above. Colonic diverticulosis. Hyperplastic. Next TCS 02/2018.  Gastric biopsy showed H. pylori.  She completed treatment.  [name] DIRECT LARYNGOSCOPY N/A 06/08/2016   Procedure: DIRECT LARYNGOSCOPY AND BIOPSY;  Surgeon: Leta Baptist, MD;  Location: [facility] OR;  Service: ENT;  Laterality: N/A;  . PARTIAL HYSTERECTOMY     age 83  . UVULECTOMY       OB History    Gravida  5   Para  4   Term  4   Preterm      AB  1   Living        SAB  1   IAB      Ectopic      Multiple      Live Births              Family History  Problem Relation Age of Onset  . Leukemia Father   . Thyroid disease Father   . Stomach cancer Paternal Grandfather   . Colon cancer Neg Hx     Social History   Tobacco Use  . Smoking status: Former Smoker    Packs/day: 1.50    Years: 50.00    Pack years: 75.00    Types: Cigarettes    Quit date: 07/16/2018    Years since quitting: 2.1  . Smokeless tobacco: Never Used  Vaping Use  . Vaping Use: Never used  Substance Use Topics  . Alcohol use: Not Currently    Comment: glass of wine occasionally  . Drug use: No    Home Medications Prior to Admission medications   Medication Sig Start Date End Date Taking? Authorizing Provider  acetaminophen (TYLENOL) 325 MG tablet Take 650 mg by mouth every 4 (four) hours as needed.     [provider]  albuterol (PROVENTIL, VENTOLIN) (5 MG/ML) 0.5% NEBU Take 1.25 mg by nebulization 3 (three) times daily.     [provider]  albuterol (VENTOLIN HFA) 108 (90 Base) MCG/ACT inhaler Inhale 1-2 puffs into the lungs every 6 (six) hours as needed for wheezing or shortness of breath. 09/03/19   Milton Ferguson, MD  ALPRAZolam Duanne Moron) 0.5 MG tablet Take 1 tablet by mouth 2 (two) times daily.  10/19/18   [provider]  amLODipine (NORVASC) 5 MG tablet TAKE 1 TABLET BY MOUTH ONCE A DAY. 01/16/17   Baird Cancer, PA-C  ascorbic  acid (VITAMIN C) 500 MG tablet Take 500 mg by mouth daily.    [provider]  atorvastatin (LIPITOR) 40 MG tablet Take 40 mg by mouth daily. 04/02/20   [provider]  azithromycin (ZITHROMAX) 500 MG tablet Take 1 tablet (500 mg total) by mouth daily for 5 days. Take as directed. 09/07/20 09/12/20  Alfredia Client, PA-C  busPIRone (BUSPAR) 7.5 MG tablet Take 1 tablet by mouth 2 (two) times a day. 04/12/19   [provider]  Carboxymethylcellulose Sodium (ARTIFICIAL TEARS OP) Apply to eye as  needed.    [provider]  Ergocalciferol (VITAMIN D2) 10 MCG (400 UNIT) TABS Take 1 tablet by mouth daily.    [provider]  ferrous sulfate 325 (65 FE) MG tablet Take 325 mg by mouth 2 (two) times daily with a meal.    [provider]  fluticasone (FLONASE) 50 MCG/ACT nasal spray Place 1 spray into both nostrils daily.    [provider]  fluticasone furoate-vilanterol (BREO ELLIPTA) 100-25 MCG/INH AEPB Inhale 1 puff into the lungs daily.    [provider]  loratadine (CLARITIN) 10 MG tablet Take 10 mg by mouth daily.    [provider]  melatonin 5 MG TABS 10 mg at bedtime.  12/05/19   [provider]  ondansetron (ZOFRAN) 4 MG tablet Take 4 mg by mouth every 6 (six) hours as needed for nausea or vomiting.    [provider]  predniSONE (DELTASONE) 20 MG tablet Take 2 tablets (40 mg total) by mouth daily for 5 days. 09/07/20 09/12/20  Alfredia Client, PA-C  Pseudoephedrine-Guaifenesin (MUCINEX D MAX STRENGTH) [phone] MG TB12 Take by mouth as needed.     [provider]  traMADol (ULTRAM) 50 MG tablet Take 50 mg by mouth as needed.  08/17/19   [provider]  triamcinolone cream (KENALOG) 0.5 % Apply 1 application topically 2 (two) times daily. As needed    [provider]  zinc gluconate 50 MG tablet Take 50 mg by mouth daily.    [provider]    Allergies    Prednisone,  Chantix [varenicline], and Codeine  Review of Systems   Review of Systems  Constitutional: Negative for chills, diaphoresis, fatigue and fever.  HENT: Positive for congestion. Negative for ear pain, facial swelling, postnasal drip, rhinorrhea, sinus pressure, sinus pain, sore throat and trouble swallowing.   Eyes: Negative for pain and visual disturbance.  Respiratory: Positive for cough, shortness of breath and wheezing.   Cardiovascular: Negative for chest pain, palpitations and leg swelling.  Gastrointestinal: Negative for abdominal distention, abdominal pain, diarrhea, nausea and vomiting.  Genitourinary: Negative for difficulty urinating.  Musculoskeletal: Negative for back pain, neck pain and neck stiffness.  Skin: Negative for pallor.  Neurological: Negative for dizziness, speech difficulty, weakness and headaches.  Psychiatric/Behavioral: Negative for confusion.    Physical Exam Updated Vital Signs BP (!) 165/55 (BP Location: Left Arm)   Pulse 100   Temp 98.7 F (37.1 C) (Oral)   Resp 17   Ht 5' 5.5" (1.664 m)   Wt 66.2 kg   SpO2 97%   BMI 23.93 kg/m   Physical Exam Constitutional:      General: She is not in acute distress.    Appearance: Normal appearance. She is not ill-appearing, toxic-appearing or diaphoretic.     Comments: Appears well, no acute distress.  HENT:     Head: Normocephalic and atraumatic.     Nose:     Right Sinus: No maxillary sinus tenderness or frontal sinus tenderness.     Left Sinus: No maxillary sinus tenderness or frontal sinus tenderness.     Mouth/Throat:     Mouth: Mucous membranes are moist.     Pharynx: Oropharynx is clear.  Eyes:     General: No scleral icterus.    Extraocular Movements: Extraocular movements intact.     Pupils: Pupils are equal, round, and reactive to light.  Cardiovascular:     Rate and Rhythm: Normal rate and regular rhythm.  Pulses: Normal pulses.     Heart sounds: Normal heart sounds.  Pulmonary:      Effort: Pulmonary effort is normal. No respiratory distress.     Breath sounds: No stridor. Wheezing present. No rhonchi or rales.     Comments: No respiratory distress, nontachypneic.  Satting at 100% on 4 L.  Diffuse wheezing inspiratory and expiratory heard throughout on all lung fields. Chest:     Chest wall: No tenderness.  Abdominal:     General: Abdomen is flat. There is no distension.     Palpations: Abdomen is soft.     Tenderness: There is no abdominal tenderness. There is no guarding or rebound.  Musculoskeletal:        General: No swelling or tenderness. Normal range of motion.     Cervical back: Normal range of motion and neck supple. No rigidity.     Right lower leg: No edema.     Left lower leg: No edema.  Skin:    General: Skin is warm and dry.     Capillary Refill: Capillary refill takes less than 2 seconds.     Coloration: Skin is not pale.  Neurological:     General: No focal deficit present.     Mental Status: She is alert and oriented to person, place, and time.  Psychiatric:        Mood and Affect: Mood normal.        Behavior: Behavior normal.     ED Results / Procedures / Treatments   Labs (all labs ordered are listed, but only abnormal results are displayed) Labs Reviewed  COMPREHENSIVE METABOLIC PANEL - Abnormal; Notable for the following components:      Result Value   Chloride 97 (*)    Glucose, Bld 134 (*)    All other components within normal limits  CBC WITH DIFFERENTIAL/PLATELET - Abnormal; Notable for the following components:   WBC 11.6 (*)    RBC 3.84 (*)    Hemoglobin 11.0 (*)    Neutro Abs 9.3 (*)    All other components within normal limits  RESP PANEL BY RT-PCR (FLU A&B, COVID) ARPGX2  URINALYSIS, ROUTINE W REFLEX MICROSCOPIC    EKG None  Radiology DG Chest Port 1 View  Result Date: 09/07/2020 CLINICAL DATA:  Cough and congestion. EXAM: PORTABLE CHEST 1 VIEW COMPARISON:  Chest x-ray 09/03/2019 FINDINGS: The cardiac  silhouette, mediastinal and hilar contours are within normal limits and stable. Stable tortuosity and calcification of the thoracic aorta. Chronic emphysematous and bronchitic lung changes without definite acute overlying pulmonary process no pleural effusions or pneumothorax. The bony thorax is intact. IMPRESSION: Chronic lung changes without definite acute overlying pulmonary process. Electronically Signed   By: Marijo Sanes M.D.   On: 09/07/2020 12:09    Procedures Procedures (including critical care time)  Medications Ordered in ED Medications  ondansetron (ZOFRAN) injection 4 mg (has no administration in time range)  albuterol (PROVENTIL,VENTOLIN) solution continuous neb (10 mg/hr Nebulization New Bag/Given 09/07/20 1446)  ipratropium-albuterol (DUONEB) 0.5-2.5 (3) MG/3ML nebulizer solution 3 mL (3 mLs Nebulization Given 09/07/20 1343)  albuterol (VENTOLIN HFA) 108 (90 Base) MCG/ACT inhaler 4 puff (4 puffs Inhalation Given 09/07/20 1303)  predniSONE (DELTASONE) tablet 60 mg (60 mg Oral Given 09/07/20 1427)  albuterol (PROVENTIL) (2.5 MG/3ML) 0.083% nebulizer solution (2.5 mg  Given 09/07/20 1343)    ED Course  I have reviewed the triage vital signs and the nursing notes.  Pertinent labs & imaging results that were  available during my care of the patient were reviewed by me and considered in my medical decision making (see chart for details).    MDM Rules/Calculators/A&P                         CARLYE PANAMENO is a 74 y.o. female-past medical history of COPD, acid reflux, hypertension, tobacco abuse that presents to the emergency department today for cough and head congestion started yesterday brought in by EMS.  Patient appears well, no respiratory distress, satting at 100% on 4 L.  Patient is normally on 3 L.  No tachypnea, no tachycardia.  With lung findings most likely slight COPD exacerbation, patient agrees.  Patient without chest pain, with likely source of COPD do not think we  need to do ACS work-up at this time.  Patient does not appear fluid overloaded, no history of CHF.  Covid negative.  CBC with slight leukocytosis of 11.6, hemoglobin stable at 11.6.  CMP without any acute findings.  Chest x-ray does not show any signs of pneumonia, does not show any acute cardiopulmonary disease.  EKG interpreted by me NSR.  After DuoNeb, patient states that her shortness of breath is much improved, however still wheezing will give continuous neb and then reevaluate.  Upon reassessment, patient states that she feels much better, wheezing has significantly improved, patient feels much better.  Upon ambulation patient remains above 94%, patient states that she is ready to go home.  Patient to be discharged with antibiotics and steroids for mild COPD exacerbation.  Stric return precautions given.  Legal guardian notified.  Doubt need for further emergent work up at this time. I explained the diagnosis and have given explicit precautions to return to the ER including for any other new or worsening symptoms. The patient understands and accepts the medical plan as it's been dictated and I have answered their questions. Discharge instructions concerning home care and prescriptions have been given. The patient is STABLE and is discharged to home in good condition.  I discussed this case with my attending physician who cosigned this note including patient's presenting symptoms, physical exam, and planned diagnostics and interventions. Attending physician stated agreement with plan or made changes to plan which were implemented.   Attending physician assessed patient at bedside.  Final Clinical Impression(s) / ED Diagnoses Final diagnoses:  COPD exacerbation ([place])    Rx / DC Orders ED Discharge Orders         Ordered    predniSONE (DELTASONE) 20 MG tablet  Daily        09/07/20 1736    azithromycin (ZITHROMAX) 500 MG tablet  Daily        09/07/20 1736           Alfredia Client,  PA-C 09/07/20 1749    Davonna Belling, MD 09/08/20 [phone]

## 2020-09-07 NOTE — ED Triage Notes (Signed)
Pt brought in by [facility] from [place] facility d/t cough and congestion that started yesterday. Pt reports yesterday she didn't feel well. Pt wears 3L O2 at all times. EMS increased O2 to 4L and reported to EMS that her breathing felt a lot better afterwards. EMS reports clear lung sounds with occasional wheeze. CBG 141. EKG WNL per EMS. BP 140/80, HR 94, resp 20, CO2 40 per EMS. Pt reports decreased appetite.

## 2020-09-07 NOTE — Discharge Instructions (Signed)
You're seen today for a mild COPD exacerbation, I want you to take the antibiotics and steroids as prescribed.  If you have any new worsening concerning symptoms such as worsening shortness of breath, difficulty breathing please come back to the emergency department.  Please use the attached instructions.  Your Covid test was negative today.  Please speak to your pharmacist about any new medications prescribed today in regards to side effects or interactions with medications.  Please follow-up with your primary care in the next couple of days.  Continue to remain on your oxygen.

## 2020-09-22 ENCOUNTER — Encounter (HOSPITAL_COMMUNITY): Payer: Self-pay | Admitting: *Deleted

## 2020-09-22 ENCOUNTER — Emergency Department (HOSPITAL_COMMUNITY): Payer: Medicare Other

## 2020-09-22 ENCOUNTER — Inpatient Hospital Stay (HOSPITAL_COMMUNITY)
Admission: EM | Admit: 2020-09-22 | Discharge: 2020-09-28 | DRG: 190 | Disposition: A | Discharge status: Hospice | Payer: Medicare Other | Source: Skilled Nursing Facility | Attending: Family Medicine | Admitting: Family Medicine

## 2020-09-22 ENCOUNTER — Other Ambulatory Visit: Payer: Self-pay

## 2020-09-22 DIAGNOSIS — R519 Headache, unspecified: Secondary | ICD-10-CM | POA: Diagnosis present

## 2020-09-22 DIAGNOSIS — C7951 Secondary malignant neoplasm of bone: Secondary | ICD-10-CM | POA: Diagnosis present

## 2020-09-22 DIAGNOSIS — J441 Chronic obstructive pulmonary disease with (acute) exacerbation: Secondary | ICD-10-CM | POA: Diagnosis present

## 2020-09-22 DIAGNOSIS — Z9981 Dependence on supplemental oxygen: Secondary | ICD-10-CM | POA: Diagnosis not present

## 2020-09-22 DIAGNOSIS — D638 Anemia in other chronic diseases classified elsewhere: Secondary | ICD-10-CM | POA: Diagnosis present

## 2020-09-22 DIAGNOSIS — R0600 Dyspnea, unspecified: Secondary | ICD-10-CM

## 2020-09-22 DIAGNOSIS — Z8616 Personal history of COVID-19: Secondary | ICD-10-CM

## 2020-09-22 DIAGNOSIS — Z66 Do not resuscitate: Secondary | ICD-10-CM | POA: Diagnosis present

## 2020-09-22 DIAGNOSIS — E785 Hyperlipidemia, unspecified: Secondary | ICD-10-CM | POA: Diagnosis present

## 2020-09-22 DIAGNOSIS — Z8589 Personal history of malignant neoplasm of other organs and systems: Secondary | ICD-10-CM

## 2020-09-22 DIAGNOSIS — Z8719 Personal history of other diseases of the digestive system: Secondary | ICD-10-CM

## 2020-09-22 DIAGNOSIS — I10 Essential (primary) hypertension: Secondary | ICD-10-CM | POA: Diagnosis present

## 2020-09-22 DIAGNOSIS — C3402 Malignant neoplasm of left main bronchus: Secondary | ICD-10-CM | POA: Diagnosis present

## 2020-09-22 DIAGNOSIS — J9621 Acute and chronic respiratory failure with hypoxia: Secondary | ICD-10-CM | POA: Diagnosis not present

## 2020-09-22 DIAGNOSIS — Z87440 Personal history of urinary (tract) infections: Secondary | ICD-10-CM

## 2020-09-22 DIAGNOSIS — Z79899 Other long term (current) drug therapy: Secondary | ICD-10-CM

## 2020-09-22 DIAGNOSIS — Z515 Encounter for palliative care: Secondary | ICD-10-CM | POA: Diagnosis not present

## 2020-09-22 DIAGNOSIS — C3490 Malignant neoplasm of unspecified part of unspecified bronchus or lung: Secondary | ICD-10-CM | POA: Diagnosis present

## 2020-09-22 DIAGNOSIS — C052 Malignant neoplasm of uvula: Secondary | ICD-10-CM | POA: Diagnosis present

## 2020-09-22 DIAGNOSIS — R531 Weakness: Secondary | ICD-10-CM | POA: Diagnosis present

## 2020-09-22 DIAGNOSIS — J439 Emphysema, unspecified: Secondary | ICD-10-CM | POA: Diagnosis present

## 2020-09-22 DIAGNOSIS — R0689 Other abnormalities of breathing: Secondary | ICD-10-CM

## 2020-09-22 DIAGNOSIS — F419 Anxiety disorder, unspecified: Secondary | ICD-10-CM | POA: Diagnosis present

## 2020-09-22 DIAGNOSIS — K219 Gastro-esophageal reflux disease without esophagitis: Secondary | ICD-10-CM | POA: Diagnosis present

## 2020-09-22 DIAGNOSIS — F329 Major depressive disorder, single episode, unspecified: Secondary | ICD-10-CM | POA: Diagnosis present

## 2020-09-22 DIAGNOSIS — Z800 Family history of malignant neoplasm of digestive organs: Secondary | ICD-10-CM

## 2020-09-22 DIAGNOSIS — Z8349 Family history of other endocrine, nutritional and metabolic diseases: Secondary | ICD-10-CM

## 2020-09-22 DIAGNOSIS — Z806 Family history of leukemia: Secondary | ICD-10-CM

## 2020-09-22 DIAGNOSIS — Z888 Allergy status to other drugs, medicaments and biological substances status: Secondary | ICD-10-CM

## 2020-09-22 DIAGNOSIS — E7800 Pure hypercholesterolemia, unspecified: Secondary | ICD-10-CM | POA: Diagnosis present

## 2020-09-22 DIAGNOSIS — Z7951 Long term (current) use of inhaled steroids: Secondary | ICD-10-CM

## 2020-09-22 DIAGNOSIS — I4891 Unspecified atrial fibrillation: Secondary | ICD-10-CM | POA: Diagnosis not present

## 2020-09-22 DIAGNOSIS — D72828 Other elevated white blood cell count: Secondary | ICD-10-CM | POA: Diagnosis not present

## 2020-09-22 DIAGNOSIS — G8929 Other chronic pain: Secondary | ICD-10-CM | POA: Diagnosis present

## 2020-09-22 DIAGNOSIS — J9611 Chronic respiratory failure with hypoxia: Secondary | ICD-10-CM | POA: Diagnosis present

## 2020-09-22 DIAGNOSIS — Z20822 Contact with and (suspected) exposure to covid-19: Secondary | ICD-10-CM | POA: Diagnosis present

## 2020-09-22 DIAGNOSIS — Z87891 Personal history of nicotine dependence: Secondary | ICD-10-CM

## 2020-09-22 DIAGNOSIS — Z885 Allergy status to narcotic agent status: Secondary | ICD-10-CM | POA: Diagnosis not present

## 2020-09-22 DIAGNOSIS — Z90711 Acquired absence of uterus with remaining cervical stump: Secondary | ICD-10-CM

## 2020-09-22 DIAGNOSIS — C3492 Malignant neoplasm of unspecified part of left bronchus or lung: Secondary | ICD-10-CM | POA: Diagnosis not present

## 2020-09-22 HISTORY — DX: Major depressive disorder, single episode, unspecified: F32.9

## 2020-09-22 HISTORY — DX: Spondylosis, unspecified: M47.9

## 2020-09-22 HISTORY — DX: Disorder of thyroid, unspecified: E07.9

## 2020-09-22 HISTORY — DX: Cervicalgia: M54.2

## 2020-09-22 HISTORY — DX: Type 2 diabetes mellitus without complications: E11.9

## 2020-09-22 LAB — CBC WITH DIFFERENTIAL/PLATELET
Abs Immature Granulocytes: 0.08 10*3/uL — ABNORMAL HIGH (ref 0.00–0.07)
Basophils Absolute: 0.1 10*3/uL (ref 0.0–0.1)
Basophils Relative: 0 %
Eosinophils Absolute: 0.1 10*3/uL (ref 0.0–0.5)
Eosinophils Relative: 1 %
HCT: 35.7 % — ABNORMAL LOW (ref 36.0–46.0)
Hemoglobin: 10.8 g/dL — ABNORMAL LOW (ref 12.0–15.0)
Immature Granulocytes: 1 %
Lymphocytes Relative: 13 %
Lymphs Abs: 1.5 10*3/uL (ref 0.7–4.0)
MCH: 28.9 pg (ref 26.0–34.0)
MCHC: 30.3 g/dL (ref 30.0–36.0)
MCV: 95.5 fL (ref 80.0–100.0)
Monocytes Absolute: 0.6 10*3/uL (ref 0.1–1.0)
Monocytes Relative: 5 %
Neutro Abs: 9.7 10*3/uL — ABNORMAL HIGH (ref 1.7–7.7)
Neutrophils Relative %: 80 %
Platelets: 256 10*3/uL (ref 150–400)
RBC: 3.74 MIL/uL — ABNORMAL LOW (ref 3.87–5.11)
RDW: 13 % (ref 11.5–15.5)
WBC: 12.1 10*3/uL — ABNORMAL HIGH (ref 4.0–10.5)
nRBC: 0 % (ref 0.0–0.2)

## 2020-09-22 LAB — COMPREHENSIVE METABOLIC PANEL
ALT: 14 U/L (ref 0–44)
AST: 15 U/L (ref 15–41)
Albumin: 3.8 g/dL (ref 3.5–5.0)
Alkaline Phosphatase: 83 U/L (ref 38–126)
Anion gap: 9 (ref 5–15)
BUN: 6 mg/dL — ABNORMAL LOW (ref 8–23)
CO2: 31 mmol/L (ref 22–32)
Calcium: 9.5 mg/dL (ref 8.9–10.3)
Chloride: 98 mmol/L (ref 98–111)
Creatinine, Ser: 0.59 mg/dL (ref 0.44–1.00)
GFR, Estimated: >60 mL/min (ref >60)
Glucose, Bld: 121 mg/dL — ABNORMAL HIGH (ref 70–99)
Potassium: 4.7 mmol/L (ref 3.5–5.1)
Sodium: 138 mmol/L (ref 135–145)
Total Bilirubin: 0.4 mg/dL (ref 0.3–1.2)
Total Protein: 6.6 g/dL (ref 6.5–8.1)

## 2020-09-22 LAB — RESP PANEL BY RT-PCR (FLU A&B, COVID) ARPGX2
Influenza A by PCR: NEGATIVE
Influenza B by PCR: NEGATIVE
SARS Coronavirus 2 by RT PCR: NEGATIVE

## 2020-09-22 LAB — PROCALCITONIN: Procalcitonin: <0.1 ng/mL

## 2020-09-22 LAB — LIPASE, BLOOD: Lipase: 24 U/L (ref 11–51)

## 2020-09-22 MED ORDER — ASCORBIC ACID 500 MG PO TABS
500.0000 mg | ORAL_TABLET | Freq: Every day | ORAL | Status: DC
  Administered 2020-09-22 – 2020-09-27 (×6): 500 mg via ORAL
  Filled 2020-09-22 (×6): qty 1

## 2020-09-22 MED ORDER — ENOXAPARIN SODIUM 40 MG/0.4ML ~~LOC~~ SOLN
40.0000 mg | SUBCUTANEOUS | Status: DC
  Administered 2020-09-22 – 2020-09-27 (×6): 40 mg via SUBCUTANEOUS
  Filled 2020-09-22 (×6): qty 0.4

## 2020-09-22 MED ORDER — BUDESONIDE 0.25 MG/2ML IN SUSP
0.2500 mg | Freq: Two times a day (BID) | RESPIRATORY_TRACT | Status: DC
  Administered 2020-09-22 – 2020-09-27 (×9): 0.25 mg via RESPIRATORY_TRACT
  Filled 2020-09-22 (×10): qty 2

## 2020-09-22 MED ORDER — GUAIFENESIN ER 600 MG PO TB12
600.0000 mg | ORAL_TABLET | Freq: Two times a day (BID) | ORAL | Status: DC
  Administered 2020-09-22 – 2020-09-27 (×11): 600 mg via ORAL
  Filled 2020-09-22 (×11): qty 1

## 2020-09-22 MED ORDER — ONDANSETRON HCL 4 MG PO TABS: 4.0000 mg | ORAL_TABLET | Freq: Four times a day (QID) | ORAL | Status: DC | PRN

## 2020-09-22 MED ORDER — ACETAMINOPHEN 325 MG PO TABS
650.0000 mg | ORAL_TABLET | ORAL | Status: DC | PRN
  Administered 2020-09-25: 650 mg via ORAL
  Filled 2020-09-22: qty 2

## 2020-09-22 MED ORDER — ALBUTEROL SULFATE HFA 108 (90 BASE) MCG/ACT IN AERS
3.0000 | INHALATION_SPRAY | RESPIRATORY_TRACT | Status: DC | PRN
  Administered 2020-09-22: 10:00:00 3 via RESPIRATORY_TRACT
  Filled 2020-09-22: qty 6.7

## 2020-09-22 MED ORDER — HYDRALAZINE HCL 20 MG/ML IJ SOLN: 10.0000 mg | INTRAMUSCULAR | Status: DC | PRN

## 2020-09-22 MED ORDER — TRAMADOL HCL 50 MG PO TABS
50.0000 mg | ORAL_TABLET | Freq: Three times a day (TID) | ORAL | Status: DC | PRN
  Administered 2020-09-24 – 2020-09-26 (×2): 50 mg via ORAL
  Filled 2020-09-22 (×3): qty 1

## 2020-09-22 MED ORDER — AEROCHAMBER Z-STAT PLUS/MEDIUM MISC
1.0000 | Freq: Once | Status: AC
  Administered 2020-09-22: 10:00:00 1

## 2020-09-22 MED ORDER — LORATADINE 10 MG PO TABS
10.0000 mg | ORAL_TABLET | Freq: Every day | ORAL | Status: DC
  Administered 2020-09-22 – 2020-09-27 (×6): 10 mg via ORAL
  Filled 2020-09-22 (×6): qty 1

## 2020-09-22 MED ORDER — FERROUS SULFATE 325 (65 FE) MG PO TABS
325.0000 mg | ORAL_TABLET | Freq: Two times a day (BID) | ORAL | Status: DC
  Administered 2020-09-22 – 2020-09-27 (×10): 325 mg via ORAL
  Filled 2020-09-22 (×10): qty 1

## 2020-09-22 MED ORDER — SODIUM CHLORIDE 0.9 % IV SOLN: 250.0000 mL | INTRAVENOUS | Status: DC | PRN

## 2020-09-22 MED ORDER — ALPRAZOLAM 0.5 MG PO TABS
0.5000 mg | ORAL_TABLET | Freq: Two times a day (BID) | ORAL | Status: DC
  Administered 2020-09-22 – 2020-09-27 (×11): 0.5 mg via ORAL
  Filled 2020-09-22 (×11): qty 1

## 2020-09-22 MED ORDER — SODIUM CHLORIDE 0.9% FLUSH
3.0000 mL | Freq: Two times a day (BID) | INTRAVENOUS | Status: DC
  Administered 2020-09-22 – 2020-09-27 (×8): 3 mL via INTRAVENOUS

## 2020-09-22 MED ORDER — SODIUM CHLORIDE 0.9% FLUSH: 3.0000 mL | INTRAVENOUS | Status: DC | PRN

## 2020-09-22 MED ORDER — ZINC SULFATE 220 (50 ZN) MG PO CAPS
220.0000 mg | ORAL_CAPSULE | Freq: Every day | ORAL | Status: DC
  Administered 2020-09-22 – 2020-09-27 (×6): 220 mg via ORAL
  Filled 2020-09-22 (×6): qty 1

## 2020-09-22 MED ORDER — BUSPIRONE HCL 5 MG PO TABS
7.5000 mg | ORAL_TABLET | Freq: Two times a day (BID) | ORAL | Status: DC
Start: 2020-09-22 — End: 2020-09-28
  Administered 2020-09-22 – 2020-09-27 (×11): 7.5 mg via ORAL
  Filled 2020-09-22 (×11): qty 2

## 2020-09-22 MED ORDER — SERTRALINE HCL 50 MG PO TABS
50.0000 mg | ORAL_TABLET | Freq: Every day | ORAL | Status: DC
  Administered 2020-09-22 – 2020-09-27 (×6): 50 mg via ORAL
  Filled 2020-09-22 (×6): qty 1

## 2020-09-22 MED ORDER — IPRATROPIUM-ALBUTEROL 0.5-2.5 (3) MG/3ML IN SOLN
3.0000 mL | Freq: Once | RESPIRATORY_TRACT | Status: AC
  Administered 2020-09-22: 14:00:00 3 mL via RESPIRATORY_TRACT
  Filled 2020-09-22: qty 3

## 2020-09-22 MED ORDER — ALBUTEROL (5 MG/ML) CONTINUOUS INHALATION SOLN
10.0000 mg/h | INHALATION_SOLUTION | Freq: Once | RESPIRATORY_TRACT | Status: AC
  Administered 2020-09-22: 15:00:00 10 mg/h via RESPIRATORY_TRACT
  Filled 2020-09-22: qty 20

## 2020-09-22 MED ORDER — METHYLPREDNISOLONE SODIUM SUCC 40 MG IJ SOLR
40.0000 mg | Freq: Two times a day (BID) | INTRAMUSCULAR | Status: DC
  Administered 2020-09-22 – 2020-09-27 (×11): 40 mg via INTRAVENOUS
  Filled 2020-09-22 (×11): qty 1

## 2020-09-22 MED ORDER — MELATONIN 3 MG PO TABS
3.0000 mg | ORAL_TABLET | Freq: Every day | ORAL | Status: DC
  Administered 2020-09-22 – 2020-09-26 (×5): 3 mg via ORAL
  Filled 2020-09-22 (×5): qty 1

## 2020-09-22 MED ORDER — METHYLPREDNISOLONE SODIUM SUCC 125 MG IJ SOLR
125.0000 mg | Freq: Once | INTRAMUSCULAR | Status: AC
  Administered 2020-09-22: 10:00:00 125 mg via INTRAVENOUS
  Filled 2020-09-22: qty 2

## 2020-09-22 MED ORDER — LACTATED RINGERS IV SOLN: INTRAVENOUS | Status: AC

## 2020-09-22 MED ORDER — IPRATROPIUM-ALBUTEROL 0.5-2.5 (3) MG/3ML IN SOLN
3.0000 mL | Freq: Four times a day (QID) | RESPIRATORY_TRACT | Status: DC
  Administered 2020-09-22 – 2020-09-28 (×14): 3 mL via RESPIRATORY_TRACT
  Filled 2020-09-22 (×19): qty 3

## 2020-09-22 MED ORDER — FLUTICASONE PROPIONATE 50 MCG/ACT NA SUSP
1.0000 | Freq: Every day | NASAL | Status: DC
  Administered 2020-09-22 – 2020-09-27 (×6): 1 via NASAL
  Filled 2020-09-22 (×3): qty 16

## 2020-09-22 MED ORDER — ATORVASTATIN CALCIUM 40 MG PO TABS
40.0000 mg | ORAL_TABLET | Freq: Every day | ORAL | Status: DC
  Administered 2020-09-22 – 2020-09-27 (×6): 40 mg via ORAL
  Filled 2020-09-22 (×6): qty 1

## 2020-09-22 MED ORDER — BACLOFEN 10 MG PO TABS
10.0000 mg | ORAL_TABLET | Freq: Every day | ORAL | Status: DC
  Administered 2020-09-22 – 2020-09-27 (×6): 10 mg via ORAL
  Filled 2020-09-22 (×6): qty 1

## 2020-09-22 MED ORDER — VITAMIN D 25 MCG (1000 UNIT) PO TABS
1000.0000 [IU] | ORAL_TABLET | Freq: Every day | ORAL | Status: DC
  Administered 2020-09-22 – 2020-09-27 (×6): 1000 [IU] via ORAL
  Filled 2020-09-22 (×6): qty 1

## 2020-09-22 MED ORDER — POLYVINYL ALCOHOL 1.4 % OP SOLN: 1.0000 [drp] | OPHTHALMIC | Status: DC | PRN

## 2020-09-22 MED ORDER — ONDANSETRON HCL 4 MG/2ML IJ SOLN
4.0000 mg | Freq: Four times a day (QID) | INTRAMUSCULAR | Status: DC | PRN
  Administered 2020-09-23 – 2020-09-25 (×3): 4 mg via INTRAVENOUS
  Filled 2020-09-22 (×3): qty 2

## 2020-09-22 MED ORDER — AMLODIPINE BESYLATE 5 MG PO TABS
5.0000 mg | ORAL_TABLET | Freq: Every day | ORAL | Status: DC
  Administered 2020-09-22 – 2020-09-27 (×6): 5 mg via ORAL
  Filled 2020-09-22 (×6): qty 1

## 2020-09-22 MED ORDER — ALBUTEROL SULFATE (2.5 MG/3ML) 0.083% IN NEBU
2.5000 mg | INHALATION_SOLUTION | Freq: Once | RESPIRATORY_TRACT | Status: AC
  Administered 2020-09-22: 14:00:00 2.5 mg via RESPIRATORY_TRACT
  Filled 2020-09-22: qty 3

## 2020-09-22 MED ORDER — ONDANSETRON HCL 4 MG/2ML IJ SOLN
4.0000 mg | Freq: Once | INTRAMUSCULAR | Status: AC
  Administered 2020-09-22: 10:00:00 4 mg via INTRAVENOUS
  Filled 2020-09-22: qty 2

## 2020-09-22 NOTE — ED Provider Notes (Signed)
[facility] EMERGENCY DEPARTMENT Provider Note   CSN: [id number] Arrival date & time: 09/22/20  6546     History Chief Complaint  Patient presents with  . Shortness of Breath    Lindsey Combs is a 74 y.o. 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  She has been using her home mdi medicines but not utilizing her nebulizer machine.  She denies feeling febrile but states her temperature has not been checked at her nursing home.  Her cough is sometimes productive of white sputum, has had Covid x 2, gets checked weekly, negative test 5 days ago.  She denies chest pain, except feels soreness from frequency of cough.  She also endorses a progressively worsening left sided headache present for the past month or longer along with persistent nausea, no emesis, but reports a 2 lb weight loss recently.  She denies focal weakness, vision changes or dizziness, she does endorse generalized weakness.     HPI     Past Medical History:  Diagnosis Date  . Acute respiratory failure with hypoxia ([place])   . Anemia   . Anxiety   . Cancer (HCC)    uvular per pt  . Cervicalgia   . COPD (chronic obstructive pulmonary disease) ([place])   . DDD (degenerative disc disease), lumbar   . Degenerative joint disease of spine   . Diabetes mellitus without complication ([place])   . Diverticulosis   . GERD (gastroesophageal reflux disease)   . H. pylori infection    treated 02/2013.  [name] History of UTI   . Hypercholesterolemia   . Hypertension   . Hyponatremia   . MDD (major depressive disorder)   . Peripheral neuropathy   . Shortness of breath    with exertion  . Spondylosis   . Thyroid disease   . Tobacco abuse     Patient Active Problem List   Diagnosis  Date Noted  . Chronic respiratory failure with hypoxia ([place]) 07/27/2020  . Mass of right lung 07/27/2020  . PAD (peripheral artery disease) ([place]) 08/13/2019  . H/O colonoscopy with polypectomy 04/25/2019  . Acute on chronic respiratory failure with hypoxia ([place]) 05/04/2018  . Severe protein-calorie malnutrition ([place]) 05/04/2018  . Closed right hip fracture ([place]) greater Trochanter 07/13/17 07/13/2017  . Vomiting and diarrhea 07/13/2017  . Fall at home, initial encounter 07/13/2017  . Hip fracture ([place]) 07/13/2017  . COPD with acute exacerbation ([place]) 02/16/2017  . Tobacco abuse 02/16/2017  . Malnutrition of moderate degree 09/22/2016  . Hypomagnesemia 09/21/2016  . Tobacco use 08/03/2016  . Hyperlipidemia 08/03/2016  . Hypertension 08/03/2016  . History of Helicobacter pylori infection 08/03/2016  . History of anemia 08/03/2016  . Diverticulosis 08/03/2016  . Arthritis 08/03/2016  . 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 Chronic obstructive pulmonary disease with acute exacerbation ([place])   . Acute respiratory failure with hypoxia ([place]) 04/03/2015  . COPD exacerbation ([place]) 04/03/2015  . Hyponatremia 04/03/2015  . Dehydration 04/03/2015  . Hyperglycemia 04/03/2015  . Anxiety   . COPD GOLD ? / 02 dep    . Peripheral neuropathy ([place])   . Tobacco dependence   . Back pain, chronic 04/19/2013  . Abnormal weight loss 02/19/2013  . Anemia, iron deficiency 02/19/2013  .  GERD (gastroesophageal reflux disease) 02/19/2013    Past Surgical History:  Procedure Laterality Date  . ABDOMINAL EXPLORATION SURGERY     age 55, large ovarian cyst  . BIOPSY N/A 03/01/2013   XKG:YJEH hiatal hernia; otherwise, normal examination/ Status post biopsies as described above. SB bx negative for Celiac. +h/pylori  . CATARACT EXTRACTION W/ INTRAOCULAR LENS  IMPLANT, BILATERAL    . COLONOSCOPY  10/16/09   Jenkins:3 polypsin the sigmoid colon/polyps in the descending colon and the rectum/scattered  diverticulum. hyperplastic  . COLONOSCOPY WITH ESOPHAGOGASTRODUODENOSCOPY (EGD) N/A 03/01/2013   UDJ:SHFWYOVZ colonic polyps - treated/removed as described above. Colonic diverticulosis. Hyperplastic. Next TCS 02/2018.  Gastric biopsy showed H. pylori.  She completed treatment.  [name] DIRECT LARYNGOSCOPY N/A 06/08/2016   Procedure: DIRECT LARYNGOSCOPY AND BIOPSY;  Surgeon: Leta Baptist, MD;  Location: [facility] OR;  Service: ENT;  Laterality: N/A;  . PARTIAL HYSTERECTOMY     age 54  . UVULECTOMY       OB History    Gravida  5   Para  4   Term  4   Preterm      AB  1   Living        SAB  1   IAB      Ectopic      Multiple      Live Births              Family History  Problem Relation Age of Onset  . Leukemia Father   . Thyroid disease Father   . Stomach cancer Paternal Grandfather   . Colon cancer Neg Hx     Social History   Tobacco Use  . Smoking status: Former Smoker    Packs/day: 1.50    Years: 50.00    Pack years: 75.00    Types: Cigarettes    Quit date: 07/16/2018    Years since quitting: 2.1  . Smokeless tobacco: Never Used  Vaping Use  . Vaping Use: Never used  Substance Use Topics  . Alcohol use: Not Currently    Comment: glass of wine occasionally  . Drug use: No    Home Medications Prior to Admission medications   Medication Sig Start Date End Date Taking? Authorizing Provider  acetaminophen (TYLENOL) 325 MG tablet Take 650 mg by mouth every 4 (four) hours as needed.    Yes [provider]  ALPRAZolam Duanne Moron) 0.5 MG tablet Take 1 tablet by mouth 2 (two) times daily.  10/19/18  Yes [provider]  amLODipine (NORVASC) 5 MG tablet TAKE 1 TABLET BY MOUTH ONCE A DAY. 01/16/17  Yes Kefalas, Manon Hilding, PA-C  ANORO ELLIPTA 62.5-25 MCG/INH AEPB Inhale 1 puff into the lungs daily. 08/26/20  Yes [provider]  ascorbic acid (VITAMIN C) 500 MG tablet Take 500 mg by mouth daily.   Yes [provider]  atorvastatin (LIPITOR) 40 MG  tablet Take 40 mg by mouth daily. 04/02/20  Yes [provider]  baclofen (LIORESAL) 10 MG tablet Take 10 mg by mouth daily. 09/08/20  Yes [provider]  busPIRone (BUSPAR) 7.5 MG tablet Take 1 tablet by mouth 2 (two) times a day. 04/12/19  Yes [provider]  Carboxymethylcellulose Sodium (ARTIFICIAL TEARS OP) Apply 1 drop to eye as needed (dry eye).   Yes [provider]  ferrous sulfate 325 (65 FE) MG tablet Take 325 mg by mouth 2 (two) times daily with a meal.   Yes [provider]  fluticasone (  FLONASE) 50 MCG/ACT nasal spray Place 1 spray into both nostrils daily.   Yes [provider]  ipratropium-albuterol (DUONEB) 0.5-2.5 (3) MG/3ML SOLN Take 3 mLs by nebulization every 6 (six) hours as needed (SOB/wheezing). 08/16/20  Yes [provider]  loratadine (CLARITIN) 10 MG tablet Take 10 mg by mouth daily.   Yes [provider]  Melatonin 10 MG SUBL Place 1 tablet under the tongue at bedtime. 09/07/20  Yes [provider]  [place] 1200 MG TB12 Take 1 tablet by mouth 2 (two) times daily. 09/15/20  Yes [provider]  ondansetron (ZOFRAN) 4 MG tablet Take 4 mg by mouth every 6 (six) hours as needed for nausea or vomiting.   Yes [provider]  sertraline (ZOLOFT) 50 MG tablet Take 50 mg by mouth daily. 09/14/20  Yes [provider]  traMADol (ULTRAM) 50 MG tablet Take 50 mg by mouth as needed for moderate pain. 08/17/19  Yes [provider]  triamcinolone cream (KENALOG) 0.5 % Apply 1 application topically 2 (two) times daily as needed (skin).   Yes [provider]  Vitamin D, Cholecalciferol, 10 MCG (400 UNIT) TABS Take 1 tablet by mouth daily. 09/07/20  Yes [provider]  zinc gluconate 50 MG tablet Take 50 mg by mouth daily.   Yes [provider]  albuterol (VENTOLIN HFA) 108 (90 Base) MCG/ACT inhaler Inhale 1-2 puffs into the lungs  every 6 (six) hours as needed for wheezing or shortness of breath. Patient not taking: No sig reported 09/03/19   Milton Ferguson, MD    Allergies    Prednisone, Chantix [varenicline], and Codeine  Review of Systems   Review of Systems  Constitutional: Negative for chills and fever.  HENT: Negative for congestion and sore throat.   Eyes: Negative.   Respiratory: Positive for cough, chest tightness, shortness of breath and wheezing.   Cardiovascular: Negative for chest pain, palpitations and leg swelling.  Gastrointestinal: Positive for nausea. Negative for abdominal pain and vomiting.  Genitourinary: Negative.   Musculoskeletal: Negative for arthralgias, joint swelling and neck pain.  Skin: Negative.  Negative for rash and wound.  Neurological: Positive for weakness and headaches. Negative for dizziness, light-headedness and numbness.  Psychiatric/Behavioral: Negative.   All other systems reviewed and are negative.   Physical Exam Updated Vital Signs BP (!) 170/71   Pulse 99   Temp 98.6 F (37 C) (Oral)   Resp 15   Ht 5' 5.5" (1.664 m)   Wt 65.3 kg   SpO2 95%   BMI 23.60 kg/m   Physical Exam Vitals and nursing note reviewed.  Constitutional:      Appearance: She is well-developed and well-nourished. She is ill-appearing.  HENT:     Head: Normocephalic and atraumatic.     Mouth/Throat:     Mouth: Mucous membranes are moist.  Eyes:     Conjunctiva/sclera: Conjunctivae normal.  Cardiovascular:     Rate and Rhythm: Normal rate and regular rhythm.     Pulses: Intact distal pulses.     Heart sounds: Normal heart sounds.  Pulmonary:     Effort: Pulmonary effort is normal.     Breath sounds: Decreased breath sounds and wheezing present. No rhonchi or rales.     Comments: Expiratory wheeze throughout all lung fields.  Prolonged expirations.  Abdominal:     General: Bowel sounds are normal.     Palpations: Abdomen is soft.     Tenderness: There is no abdominal tenderness.  Musculoskeletal:        General: Normal range of motion.     Cervical back: Normal range of motion.  Skin:    General: Skin is warm and dry.  Neurological:     Mental Status: She is alert.  Psychiatric:        Mood and Affect: Mood and affect normal.     ED Results / Procedures / Treatments   Labs (all labs ordered are listed, but only abnormal results are displayed) Labs Reviewed  CBC WITH DIFFERENTIAL/PLATELET - Abnormal; Notable for the following components:      Result Value   WBC 12.1 (*)    RBC 3.74 (*)    Hemoglobin 10.8 (*)    HCT 35.7 (*)    Neutro Abs 9.7 (*)    Abs Immature Granulocytes 0.08 (*)    All other components within normal limits  COMPREHENSIVE METABOLIC PANEL - Abnormal; Notable for the following components:   Glucose, Bld 121 (*)    BUN 6 (*)    All other components within normal limits  RESP PANEL BY RT-PCR (FLU A&B, COVID) [id number]  LIPASE, BLOOD    EKG EKG Interpretation  Date/Time:  Tuesday September 22 2020 09:34:50 EST Ventricular Rate:  96 PR Interval:    QRS Duration: 86 QT Interval:  364 QTC Calculation: 460 R Axis:   66 Text Interpretation: Sinus rhythm Abnormal R-wave progression, early transition No significant change since last tracing Confirmed by Calvert Cantor [phone]) on 09/22/2020 11:08:14 AM   Radiology CT Head Wo Contrast  Result Date: 09/22/2020 CLINICAL DATA:  Headache, new or worsening (Age >= 50y) EXAM: CT HEAD WITHOUT CONTRAST TECHNIQUE: Contiguous axial images were obtained from the base of the skull through the vertex without intravenous contrast. COMPARISON:  09/21/2016. FINDINGS: Brain: No acute infarct or intracranial hemorrhage. No mass lesion. No midline shift, ventriculomegaly or extra-axial fluid collection. Vascular: No hyperdense vessel or unexpected calcification. Bilateral skull base atherosclerotic calcifications. Skull: Negative for fracture or focal lesion. Sinuses/Orbits: Normal orbits. Clear paranasal  sinuses. No mastoid effusion. Other: None. IMPRESSION: No acute intracranial process. Electronically Signed   By: Primitivo Gauze M.D.   On: 09/22/2020 12:04   DG Chest Port 1 View  Result Date: 09/22/2020 CLINICAL DATA:  Shortness of breath, wheezing. EXAM: PORTABLE CHEST 1 VIEW COMPARISON:  September 07, 2020. FINDINGS: The heart size and mediastinal contours are within normal limits. Both lungs are clear. No pneumothorax or pleural effusion is noted. The visualized skeletal structures are unremarkable. IMPRESSION: No active disease. Electronically Signed   By: Marijo Conception M.D.   On: 09/22/2020 09:57    Procedures Procedures (including critical care time)  Medications Ordered in ED Medications  albuterol (VENTOLIN HFA) 108 (90 Base) MCG/ACT inhaler 3 puff (3 puffs Inhalation Given 09/22/20 1023)  methylPREDNISolone sodium succinate (SOLU-MEDROL) 125 mg/2 mL injection 125 mg (125 mg Intravenous Given 09/22/20 1023)  ondansetron (ZOFRAN) injection 4 mg (4 mg Intravenous Given 09/22/20 1023)  aerochamber Z-Stat Plus/medium 1 each (1 each Other Given 09/22/20 1028)  ipratropium-albuterol (DUONEB) 0.5-2.5 (3) MG/3ML nebulizer solution 3 mL (3 mLs Nebulization Given 09/22/20 1337)  albuterol (PROVENTIL) (2.5 MG/3ML) 0.083% nebulizer solution 2.5 mg (2.5 mg Nebulization Given 09/22/20 1339)  albuterol (PROVENTIL,VENTOLIN) solution continuous neb (10 mg/hr Nebulization Given 09/22/20 1447)    ED Course  I have reviewed the triage vital signs and the nursing notes.  Pertinent labs & imaging results that were available during my care of the patient were  reviewed by me and considered in my medical decision making (see chart for details).    MDM Rules/Calculators/A&P                          Labs and imaging reviewed, no pneumonia, pt is covid negative today.  Ct head obtained due to persistent headache, no intracranial process/space occupying lesions, no metastatic disease.  CXR also  negative for obvious lung mass.  She has had solumedrol IV, also multiple albuterol tx including mdi and neb, currently undergoing hour long albuterol neb tx.  Still with persistent wheeze and sob.  Will require admission for tx of COPD flare.    Pt seen by Dr. Karlton Lemon while here.   Discussed with Dr. Manuella Ghazi who will see and admit pt. Final Clinical Impression(s) / ED Diagnoses Final diagnoses:  COPD exacerbation ([place])  Chronic intractable headache, unspecified headache type    Rx / DC Orders ED Discharge Orders    None       Landis Martins 09/22/20 1502    Truddie Hidden, MD 09/23/20 [phone]

## 2020-09-22 NOTE — ED Notes (Signed)
Pt reports she hasn't urinated since yesterday afternoon. Bladder scan done showing 164ml of urine present. Dr. Manuella Ghazi notified.

## 2020-09-22 NOTE — H&P (Addendum)
History and Physical    Lindsey Combs:[id number] DOB: September 10, 1946 DOA: 09/22/2020  PCP: [name], MD   Patient coming from: [facility]  Chief Complaint: Dyspnea  HPI: Lindsey Combs is a 74 y.o. female with medical history significant for COPD with chronic hypoxemia on 4 L nasal cannula oxygen, anemia of inflammation, distant history of uvular cancer, pulmonary nodule with suspected lung cancer, hypertension, dyslipidemia, GERD, and anxiety who presented to the ED with worsening shortness of breath and wheezing over the past several days.  She has been using her home inhaler medications without significant relief.  She also reports feeling somewhat febrile.  She also is noted to have a cough that is sometimes productive of whitish sputum and has had Covid twice, but does get checked weekly and her last negative test was 5 days ago.  She denies any chest pain.  She is also noticing a left-sided headache for the past month or so with persistent nausea, but no emesis and has had an approximately 2 pound weight loss.    ED Course: Vital signs stable and patient is afebrile.  Hemoglobin 10.8 and mild leukocytosis of 12,100 noted.  CT of the head with no acute findings and chest x-ray with no acute findings.  Covid testing negative.  EKG with sinus rhythm at 96 bpm.  Patient has received some steroids and nebulized breathing treatments in the ED.  Review of Systems: Reviewed as noted above, otherwise negative.  Past Medical History:  Diagnosis Date  . Acute respiratory failure with hypoxia ([place])   . Anemia   . Anxiety   . Cancer (HCC)    uvular per pt  . Cervicalgia   . COPD (chronic obstructive pulmonary disease) ([place])   . DDD (degenerative disc disease), lumbar   . Degenerative joint disease of spine   . Diabetes mellitus without complication ([place])   . Diverticulosis   . GERD (gastroesophageal reflux disease)   . H. pylori infection    treated 02/2013.  [name] History of UTI   .  Hypercholesterolemia   . Hypertension   . Hyponatremia   . MDD (major depressive disorder)   . Peripheral neuropathy   . Shortness of breath    with exertion  . Spondylosis   . Thyroid disease   . Tobacco abuse     Past Surgical History:  Procedure Laterality Date  . ABDOMINAL EXPLORATION SURGERY     age 33, large ovarian cyst  . BIOPSY N/A 03/01/2013   GUR:KYHC hiatal hernia; otherwise, normal examination/ Status post biopsies as described above. SB bx negative for Celiac. +h/pylori  . CATARACT EXTRACTION W/ INTRAOCULAR LENS  IMPLANT, BILATERAL    . COLONOSCOPY  10/16/09   Jenkins:3 polypsin the sigmoid colon/polyps in the descending colon and the rectum/scattered diverticulum. hyperplastic  . COLONOSCOPY WITH ESOPHAGOGASTRODUODENOSCOPY (EGD) N/A 03/01/2013   WCB:JSEGBTDV colonic polyps - treated/removed as described above. Colonic diverticulosis. Hyperplastic. Next TCS 02/2018.  Gastric biopsy showed H. pylori.  She completed treatment.  [name] DIRECT LARYNGOSCOPY N/A 06/08/2016   Procedure: DIRECT LARYNGOSCOPY AND BIOPSY;  Surgeon: Leta Baptist, MD;  Location: [facility] OR;  Service: ENT;  Laterality: N/A;  . PARTIAL HYSTERECTOMY     age 89  . UVULECTOMY       reports that she quit smoking about 2 years ago. Her smoking use included cigarettes. She has a 75.00 pack-year smoking history. She has never used smokeless tobacco. She reports previous alcohol use. She reports that she does not  use drugs.  Allergies  Allergen Reactions  . Prednisone Nausea Only  . Chantix [Varenicline] Other (See Comments)    Mental status changes  . Codeine Itching    Family History  Problem Relation Age of Onset  . Leukemia Father   . Thyroid disease Father   . Stomach cancer Paternal Grandfather   . Colon cancer Neg Hx     Prior to Admission medications   Medication Sig Start Date End Date Taking? Authorizing Provider  acetaminophen (TYLENOL) 325 MG tablet Take 650 mg by mouth every 4 (four) hours as needed.     Yes [provider]  ALPRAZolam Duanne Moron) 0.5 MG tablet Take 1 tablet by mouth 2 (two) times daily.  10/19/18  Yes [provider]  amLODipine (NORVASC) 5 MG tablet TAKE 1 TABLET BY MOUTH ONCE A DAY. 01/16/17  Yes Kefalas, Manon Hilding, PA-C  ANORO ELLIPTA 62.5-25 MCG/INH AEPB Inhale 1 puff into the lungs daily. 08/26/20  Yes [provider]  ascorbic acid (VITAMIN C) 500 MG tablet Take 500 mg by mouth daily.   Yes [provider]  atorvastatin (LIPITOR) 40 MG tablet Take 40 mg by mouth daily. 04/02/20  Yes [provider]  baclofen (LIORESAL) 10 MG tablet Take 10 mg by mouth daily. 09/08/20  Yes [provider]  busPIRone (BUSPAR) 7.5 MG tablet Take 1 tablet by mouth 2 (two) times a day. 04/12/19  Yes [provider]  Carboxymethylcellulose Sodium (ARTIFICIAL TEARS OP) Apply 1 drop to eye as needed (dry eye).   Yes [provider]  ferrous sulfate 325 (65 FE) MG tablet Take 325 mg by mouth 2 (two) times daily with a meal.   Yes [provider]  fluticasone (FLONASE) 50 MCG/ACT nasal spray Place 1 spray into both nostrils daily.   Yes [provider]  ipratropium-albuterol (DUONEB) 0.5-2.5 (3) MG/3ML SOLN Take 3 mLs by nebulization every 6 (six) hours as needed (SOB/wheezing). 08/16/20  Yes [provider]  loratadine (CLARITIN) 10 MG tablet Take 10 mg by mouth daily.   Yes [provider]  Melatonin 10 MG SUBL Place 1 tablet under the tongue at bedtime. 09/07/20  Yes [provider]  Warren 1200 MG TB12 Take 1 tablet by mouth 2 (two) times daily. 09/15/20  Yes [provider]  ondansetron (ZOFRAN) 4 MG tablet Take 4 mg by mouth every 6 (six) hours as needed for nausea or vomiting.   Yes [provider]  sertraline (ZOLOFT) 50 MG tablet Take 50 mg by mouth daily. 09/14/20  Yes [provider]  traMADol (ULTRAM) 50 MG tablet Take 50 mg by mouth as  needed for moderate pain. 08/17/19  Yes [provider]  triamcinolone cream (KENALOG) 0.5 % Apply 1 application topically 2 (two) times daily as needed (skin).   Yes [provider]  Vitamin D, Cholecalciferol, 10 MCG (400 UNIT) TABS Take 1 tablet by mouth daily. 09/07/20  Yes [provider]  zinc gluconate 50 MG tablet Take 50 mg by mouth daily.   Yes [provider]  albuterol (VENTOLIN HFA) 108 (90 Base) MCG/ACT inhaler Inhale 1-2 puffs into the lungs every 6 (six) hours as needed for wheezing or shortness of breath. Patient not taking: No sig reported 09/03/19   Milton Ferguson, MD    Physical Exam: Vitals:   09/22/20 1344 09/22/20 1400 09/22/20 1430 09/22/20 1451  BP:  (!) 184/73 (!) 170/71   Pulse:  91 99   Resp:  16 15   Temp:      TempSrc:      SpO2: 97% 97% 97% 95%  Weight:      Height:        Constitutional: NAD, calm, comfortable Vitals:   09/22/20 1344 09/22/20 1400 09/22/20 1430 09/22/20 1451  BP:  (!) 184/73 (!) 170/71   Pulse:  91 99   Resp:  16 15   Temp:      TempSrc:      SpO2: 97% 97% 97% 95%  Weight:      Height:       Eyes: lids and conjunctivae normal Neck: normal, supple Respiratory: clear to auscultation bilaterally. Normal respiratory effort. No accessory muscle use.  Currently on 4 L nasal cannula oxygen. Cardiovascular: Regular rate and rhythm, no murmurs. Abdomen: no tenderness, no distention. Bowel sounds positive.  Musculoskeletal:  No edema. Skin: no rashes, lesions, ulcers.  Psychiatric: Flat affect  Labs on Admission: I have personally reviewed following labs and imaging studies  CBC: Recent Labs  Lab 09/22/20 1014  WBC 12.1*  NEUTROABS 9.7*  HGB 10.8*  HCT 35.7*  MCV 95.5  PLT 751   Basic Metabolic Panel: Recent Labs  Lab 09/22/20 1014  NA 138  K 4.7  CL 98  CO2 31  GLUCOSE 121*  BUN 6*  CREATININE 0.59  CALCIUM 9.5   GFR: Estimated Creatinine Clearance: 55 mL/min (by C-G formula  based on SCr of 0.59 mg/dL). Liver Function Tests: Recent Labs  Lab 09/22/20 1014  AST 15  ALT 14  ALKPHOS 83  BILITOT 0.4  PROT 6.6  ALBUMIN 3.8   Recent Labs  Lab 09/22/20 1014  LIPASE 24   No results for input(s): AMMONIA in the last 168 hours. Coagulation Profile: No results for input(s): INR, PROTIME in the last 168 hours. Cardiac Enzymes: No results for input(s): CKTOTAL, CKMB, CKMBINDEX, TROPONINI in the last 168 hours. BNP (last 3 results) No results for input(s): PROBNP in the last 8760 hours. HbA1C: No results for input(s): HGBA1C in the last 72 hours. CBG: No results for input(s): GLUCAP in the last 168 hours. Lipid Profile: No results for input(s): CHOL, HDL, LDLCALC, TRIG, CHOLHDL, LDLDIRECT in the last 72 hours. Thyroid Function Tests: No results for input(s): TSH, T4TOTAL, FREET4, T3FREE, THYROIDAB in the last 72 hours. Anemia Panel: No results for input(s): VITAMINB12, FOLATE, FERRITIN, TIBC, IRON, RETICCTPCT in the last 72 hours. Urine analysis:    Component Value Date/Time   COLORURINE YELLOW 11/23/2018 1316   APPEARANCEUR CLEAR 11/23/2018 1316   LABSPEC 1.011 11/23/2018 1316   PHURINE 6.0 11/23/2018 1316   GLUCOSEU NEGATIVE 11/23/2018 1316   HGBUR SMALL (A) 11/23/2018 1316   BILIRUBINUR NEGATIVE 11/23/2018 1316   KETONESUR NEGATIVE 11/23/2018 1316   PROTEINUR NEGATIVE 11/23/2018 1316   UROBILINOGEN 0.2 04/04/2015 1918   NITRITE NEGATIVE 11/23/2018 1316   LEUKOCYTESUR NEGATIVE 11/23/2018 1316    Radiological Exams on Admission: CT Head Wo Contrast  Result Date: 09/22/2020 CLINICAL DATA:  Headache, new or worsening (Age >= 50y) EXAM: CT HEAD WITHOUT CONTRAST TECHNIQUE: Contiguous axial images were obtained from the base of the skull through the vertex without intravenous contrast. COMPARISON:  09/21/2016. FINDINGS: Brain: No acute infarct or intracranial hemorrhage. No mass lesion. No midline shift, ventriculomegaly or extra-axial fluid  collection. Vascular: No hyperdense vessel or unexpected calcification. Bilateral skull base atherosclerotic calcifications. Skull: Negative for fracture or focal lesion. Sinuses/Orbits: Normal orbits. Clear paranasal sinuses. No mastoid effusion. Other: None. IMPRESSION:  No acute intracranial process. Electronically Signed   By: Primitivo Gauze M.D.   On: 09/22/2020 12:04   DG Chest Port 1 View  Result Date: 09/22/2020 CLINICAL DATA:  Shortness of breath, wheezing. EXAM: PORTABLE CHEST 1 VIEW COMPARISON:  September 07, 2020. FINDINGS: The heart size and mediastinal contours are within normal limits. Both lungs are clear. No pneumothorax or pleural effusion is noted. The visualized skeletal structures are unremarkable. IMPRESSION: No active disease. Electronically Signed   By: Marijo Conception M.D.   On: 09/22/2020 09:57    EKG: Independently reviewed. SR 96bpm.  Assessment/Plan Active Problems:   COPD with acute exacerbation (HCC)    Acute COPD exacerbation -Noted to have chronic hypoxemia on 4 L nasal cannula oxygen -COVID testing negative; prior COVID infection -Maintain on DuoNeb scheduled -Pulmicort BID -IV Solu-Medrol -Chest x-ray with no acute findings, plan to check procalcitonin  Chronic headache -CT head negative for any acute abnormality -No other neurological abnormalities noted -May consider further evaluation with brain MRI as needed inpatient versus outpatient  Anemia of inflammation-chronic -Stable at baseline, continue to monitor  Dyslipidemia -Continue statin  Hypertension -Continue home amlodipine -And as needed hydralazine  Anxiety -Continue home medications  History of distant uvular cancer -Follow-up outpatient with Dr. Delton Coombes  Pulmonary nodule -PET scan positive as noted in pulmonology outpatient note and declined biopsy -Prior tobacco abuse  DVT prophylaxis: Lovenox Code Status: DNR/DNI and no feeding tube Family Communication: Spoke  with son on phone 12/28 Disposition Plan:Admit for COPD exacerbation Consults called:None Admission status: Inpatient, MedSurg  Severity of Illness: The appropriate patient status for this patient is INPATIENT. Inpatient status is judged to be reasonable and necessary in order to provide the required intensity of service to ensure the patient's safety. The patient's presenting symptoms, physical exam findings, and initial radiographic and laboratory data in the context of their chronic comorbidities is felt to place them at high risk for further clinical deterioration. Furthermore, it is not anticipated that the patient will be medically stable for discharge from the hospital within 2 midnights of admission. The following factors support the patient status of inpatient.   " The patient's presenting symptoms include SOB. " The worrisome physical exam findings include wheezing. " The chronic co-morbidities include COPD.   * I certify that at the point of admission it is my clinical judgment that the patient will require inpatient hospital care spanning beyond 2 midnights from the point of admission due to high intensity of service, high risk for further deterioration and high frequency of surveillance required.*    Seibert Keeter D Taiven Greenley DO [facility]  If 7PM-7AM, please contact night-coverage [email or web address]  09/22/2020, 3:00 PM

## 2020-09-22 NOTE — ED Notes (Signed)
Patient transported to CT 

## 2020-09-22 NOTE — ED Triage Notes (Signed)
Pt brought in by [facility] from [facility] facility d/t SOB and productive cough since last night. 1 month ago pt's O2 was increased from 3L to 4L which has been helping until last night. Pt has used inhaler with no relief. Pt was given Albuterol neb with some relief. O2 sat 96% on 4L for EMS.

## 2020-09-22 NOTE — ED Notes (Signed)
Pt given a warm blanket and socks

## 2020-09-22 NOTE — ED Notes (Signed)
Respiratory with pt at bedside for nebs

## 2020-09-22 NOTE — ED Notes (Signed)
Pt called RN to room d/t difficulty breathing. Pt had labored respirations, O2 sats 93-94% on 4L O2 via . Pt had difficulty completing full sentences. Pt repositioned to a more upright position. Evalee Jefferson, PA notified.

## 2020-09-23 ENCOUNTER — Encounter (HOSPITAL_COMMUNITY): Payer: Self-pay | Admitting: Internal Medicine

## 2020-09-23 ENCOUNTER — Other Ambulatory Visit: Payer: Self-pay

## 2020-09-23 DIAGNOSIS — J441 Chronic obstructive pulmonary disease with (acute) exacerbation: Secondary | ICD-10-CM | POA: Diagnosis not present

## 2020-09-23 LAB — CBC
HCT: 36 % (ref 36.0–46.0)
Hemoglobin: 11.3 g/dL — ABNORMAL LOW (ref 12.0–15.0)
MCH: 29.3 pg (ref 26.0–34.0)
MCHC: 31.4 g/dL (ref 30.0–36.0)
MCV: 93.3 fL (ref 80.0–100.0)
Platelets: 305 10*3/uL (ref 150–400)
RBC: 3.86 MIL/uL — ABNORMAL LOW (ref 3.87–5.11)
RDW: 13.2 % (ref 11.5–15.5)
WBC: 19.6 10*3/uL — ABNORMAL HIGH (ref 4.0–10.5)
nRBC: 0 % (ref 0.0–0.2)

## 2020-09-23 LAB — BASIC METABOLIC PANEL
Anion gap: 13 (ref 5–15)
BUN: 10 mg/dL (ref 8–23)
CO2: 27 mmol/L (ref 22–32)
Calcium: 10 mg/dL (ref 8.9–10.3)
Chloride: 98 mmol/L (ref 98–111)
Creatinine, Ser: 0.67 mg/dL (ref 0.44–1.00)
GFR, Estimated: >60 mL/min (ref >60)
Glucose, Bld: 165 mg/dL — ABNORMAL HIGH (ref 70–99)
Potassium: 4.1 mmol/L (ref 3.5–5.1)
Sodium: 138 mmol/L (ref 135–145)

## 2020-09-23 LAB — MAGNESIUM: Magnesium: 2.1 mg/dL (ref 1.7–2.4)

## 2020-09-23 MED ORDER — AZITHROMYCIN 250 MG PO TABS
500.0000 mg | ORAL_TABLET | Freq: Every day | ORAL | Status: DC
  Administered 2020-09-23 – 2020-09-25 (×3): 500 mg via ORAL
  Filled 2020-09-23 (×3): qty 2

## 2020-09-23 MED ORDER — ALBUTEROL SULFATE (2.5 MG/3ML) 0.083% IN NEBU
2.5000 mg | INHALATION_SOLUTION | RESPIRATORY_TRACT | Status: DC | PRN
  Administered 2020-09-26 – 2020-09-28 (×2): 2.5 mg via RESPIRATORY_TRACT
  Filled 2020-09-23 (×2): qty 3

## 2020-09-23 NOTE — Plan of Care (Signed)
  Problem: Acute Rehab PT Goals(only PT should resolve) Goal: Patient Will Transfer Sit To/From Stand Outcome: Progressing Flowsheets (Taken 09/23/2020 1646) Patient will transfer sit to/from stand: with modified independence Goal: Pt Will Transfer Bed To Chair/Chair To Bed Outcome: Progressing Flowsheets (Taken 09/23/2020 1646) Pt will Transfer Bed to Chair/Chair to Bed: with modified independence Goal: Pt Will Ambulate Outcome: Progressing Flowsheets (Taken 09/23/2020 1646) Pt will Ambulate:  100 feet  with supervision  with least restrictive assistive device

## 2020-09-23 NOTE — TOC Initial Note (Signed)
Transition of Care [facility]) - Initial/Assessment Note   Patient Details  Name: Lindsey Combs MRN: [id number] Date of Birth: [birth year]  [facility] of [facility]) CM/SW Contact:    Sherie Don, LCSW Phone Number: 09/23/2020, 9:59 AM  Clinical Narrative: Patient is a 74 year old female who was admitted for COPD with acute exacerbation. Patient is a resident of [facility]. FL2 has been started. TOC to follow.  Expected Discharge Plan: Assisted Living Barriers to Discharge: Continued Medical Work up  Patient Goals and CMS Choice Patient states their goals for this hospitalization and ongoing recovery are:: [facility] to [facility].gov Compare Post Acute Care list provided to:: Patient Choice offered to / list presented to : Patient  Expected Discharge Plan and Services Expected Discharge Plan: Assisted Living In-house Referral: Clinical Social Work Discharge Planning Services: NA Post Acute Care Choice: NA Living arrangements for the past 2 months: [place]              DME Arranged: N/A DME Agency: NA  Prior Living Arrangements/Services Living arrangements for the past 2 months: [place] Lives with:: Facility Resident Patient language and need for interpreter reviewed:: Yes Do you feel safe going back to the place where you live?: Yes      Need for Family Participation in Patient Care: No (Comment) Care giver support system in place?: Yes (comment) Current home services: DME (O2) Criminal Activity/Legal Involvement Pertinent to Current Situation/Hospitalization: No - Comment as needed  Activities of Daily Living Home Assistive Devices/Equipment: Oxygen  Permission Sought/Granted Permission sought to share information with : Chartered certified accountant granted to share info w AGENCY: [facility]  Emotional Assessment Appearance:: Appears stated age Attitude/Demeanor/Rapport: Engaged Affect (typically observed):  Accepting Orientation: : Oriented to Self,Oriented to Place,Oriented to  Time,Oriented to Situation Alcohol / Substance Use: Not Applicable Psych Involvement: No (comment)  Admission diagnosis:  COPD exacerbation ([place]) [J44.1] COPD with acute exacerbation ([place]) [J44.1] Chronic intractable headache, unspecified headache type [R51.9, G89.29] Patient Active Problem List   Diagnosis Date Noted   Chronic respiratory failure with hypoxia ([place]) 07/27/2020   Mass of right lung 07/27/2020   PAD (peripheral artery disease) ([place]) 08/13/2019   H/O colonoscopy with polypectomy 04/25/2019   Acute on chronic respiratory failure with hypoxia ([place]) 05/04/2018   Severe protein-calorie malnutrition ([place]) 05/04/2018   Closed right hip fracture ([place]) greater Trochanter 07/13/17 07/13/2017   Vomiting and diarrhea 07/13/2017   Fall at home, initial encounter 07/13/2017   Hip fracture ([place]) 07/13/2017   COPD with acute exacerbation ([place]) 02/16/2017   Tobacco abuse 02/16/2017   Malnutrition of moderate degree 09/22/2016   Hypomagnesemia 09/21/2016   Tobacco use 08/03/2016   Hyperlipidemia 08/03/2016   Hypertension 23/76/2831   History of Helicobacter pylori infection 08/03/2016   History of anemia 08/03/2016   Diverticulosis 08/03/2016   Arthritis 08/03/2016   Carcinoma of uvula ([place]) 07/13/2016   Chronic obstructive pulmonary disease with acute exacerbation (HCC)    Acute respiratory failure with hypoxia ([place]) 04/03/2015   COPD exacerbation ([place]) 04/03/2015   Hyponatremia 04/03/2015   Dehydration 04/03/2015   Hyperglycemia 04/03/2015   Anxiety    COPD GOLD ? / 02 dep     Peripheral neuropathy ([place])    Tobacco dependence    Back pain, chronic 04/19/2013   Abnormal weight loss 02/19/2013   Anemia, iron deficiency 02/19/2013   GERD (gastroesophageal reflux disease) 02/19/2013   PCP:  [name], MD Pharmacy:   [facility] -  [place], [address] [place] [address] Phone: [phone] Fax: [phone]  Readmission Risk Interventions No flowsheet data found.

## 2020-09-23 NOTE — Evaluation (Signed)
Physical Therapy Evaluation Patient Details Name: Lindsey Combs MRN: [id number] DOB: June 07, 1946 Today's Date: 09/23/2020   History of Present Illness  Lindsey Combs is a 74 y.o. female with medical history significant for COPD with chronic hypoxemia on 4 L nasal cannula oxygen, anemia of inflammation, distant history of uvular cancer, pulmonary nodule with suspected lung cancer, hypertension, dyslipidemia, GERD, and anxiety who presented to the ED with worsening shortness of breath and wheezing over the past several days.  She has been using her home inhaler medications without significant relief.  She also reports feeling somewhat febrile.  She also is noted to have a cough that is sometimes productive of whitish sputum and has had Covid twice, but does get checked weekly and her last negative test was 5 days ago.  She denies any chest pain.  She is also noticing a left-sided headache for the past month or so with persistent nausea, but no emesis and has had an approximately 2 pound weight loss.   Clinical Impression    Patient exhibits generalized weakness, reduced functional activity tolerance, and unsteadiness on feet as a result of deficits and requires steadying assistance to prevent LOB with transitional movements due to her generalized weakness.  Patient exhibiting reduced ability to safely ambulate due to unsteadiness as well as SOB and rapid onset of fatigue requiring cues for pursed-lip breathing and activity pacing.  Patient demonstrates willingness to participate and is motivated to return to ALF and participate with Home Health Physical therapy services.  Patient would benefit from continued PT services for remainder of hospitalization to increase time OOB and participate in formal exercise/ambulation program to increase functional activity tolerance.   Follow Up Recommendations Home health PT    Equipment Recommendations  None recommended by PT (pt has requisite DME at ALF)     Recommendations for Other Services       Precautions / Restrictions Precautions Precautions: Fall      Mobility  Bed Mobility Overal bed mobility: Independent               Patient Response: Anxious  Transfers Overall transfer level: Needs assistance Equipment used: Rolling walker (2 wheeled) Transfers: Sit to/from [facility] Sit to Stand: Supervision Stand pivot transfers: Supervision       General transfer comment: unsteadiness present and cues for safety awareness and proper sequence with AD to improve safety with maneuvering in small spaces  Ambulation/Gait Ambulation/Gait assistance: Min guard Gait Distance (Feet): 30 Feet Assistive device: Rolling walker (2 wheeled) Gait Pattern/deviations: Wide base of support     General Gait Details: decreased tolerance with standing rest periods and cues for pursed-lip breathing due to SOB and fatigue  Stairs            Wheelchair Mobility    Modified Rankin (Stroke Patients Only)       Balance Overall balance assessment: Mild deficits observed, not formally tested                                           Pertinent Vitals/Pain Pain Assessment: No/denies pain    Home Living Family/patient expects to be discharged to:: Assisted living               Home Equipment: Walker - 4 wheels Additional Comments: O2 dependent at 4 L    Prior Function Level of Independence: Independent with assistive  device(s)         Comments: Patient reports she typically performs mobility with rollator and has assistance prn for ADL     Hand Dominance        Extremity/Trunk Assessment   Upper Extremity Assessment Upper Extremity Assessment: Defer to OT evaluation    Lower Extremity Assessment Lower Extremity Assessment: Generalized weakness       Communication   Communication: No difficulties  Cognition Arousal/Alertness: Awake/alert Behavior During Therapy: WFL  for tasks assessed/performed Overall Cognitive Status: Within Functional Limits for tasks assessed                                        General Comments      Exercises General Exercises - Lower Extremity Ankle Circles/Pumps: AROM;20 reps;Both Quad Sets: Strengthening;Both;20 reps   Assessment/Plan    PT Assessment Patient needs continued PT services;All further PT needs can be met in the next venue of care  PT Problem List Decreased strength;Decreased activity tolerance;Decreased balance;Decreased knowledge of use of DME;Decreased mobility;Decreased safety awareness;Decreased knowledge of precautions;Cardiopulmonary status limiting activity       PT Treatment Interventions DME instruction;Gait training;Functional mobility training;Therapeutic activities;Patient/family education;Balance training;Therapeutic exercise    PT Goals (Current goals can be found in the Care Plan section)  Acute Rehab PT Goals Patient Stated Goal: "Get my strength back" PT Goal Formulation: With patient Time For Goal Achievement: 09/26/20 Potential to Achieve Goals: Good    Frequency Min 3X/week   Barriers to discharge        Co-evaluation               AM-PAC PT "6 Clicks" Mobility  Outcome Measure Help needed turning from your back to your side while in a flat bed without using bedrails?: None Help needed moving from lying on your back to sitting on the side of a flat bed without using bedrails?: None Help needed moving to and from a bed to a chair (including a wheelchair)?: A Little Help needed standing up from a chair using your arms (e.g., wheelchair or bedside chair)?: A Little Help needed to walk in hospital room?: A Little Help needed climbing 3-5 steps with a railing? : A Lot 6 Click Score: 19    End of Session   Activity Tolerance: Patient limited by fatigue Patient left: in bed;with call bell/phone within reach;with bed alarm set Nurse Communication:  Mobility status PT Visit Diagnosis: Unsteadiness on feet (R26.81);Muscle weakness (generalized) (M62.81)    Time: 2979-8921 PT Time Calculation (min) (ACUTE ONLY): 32 min   Charges:   PT Evaluation $PT Eval Low Complexity: 1 Low PT Treatments $Therapeutic Exercise: 8-22 mins       4:42 PM, 09/23/20 M. Sherlyn Lees, PT, DPT Physical Therapist- [place] Office Number: [phone]

## 2020-09-23 NOTE — Progress Notes (Signed)
PT Cancellation Note  Patient Details Name: Lindsey Combs MRN: [id number] DOB: [birth year]   Cancelled Treatment:    Reason Eval/Treat Not Completed: Patient declined, no reason specified. Therapist presented to pt's room and educated pt on PT eval, but pt declines. Pt reports she just had a "coughing spell" and threw up and "I just need to rest".  Will check back as schedule permits.   Tori Harvey Matlack PT, DPT 09/23/20, 1:14 PM [phone]

## 2020-09-23 NOTE — Progress Notes (Signed)
PROGRESS NOTE    Lindsey Combs  GBT:[id number] DOB: Feb 09, 1946 DOA: 09/22/2020 PCP: [name], MD    Brief Narrative:  74 year old female with history of COPD and chronic hypoxemia on 4 L nasal cannula oxygen, chronic anemia, hypertension, hyperlipidemia, GERD and anxiety presented to ER with worsening shortness of breath and wheezing.  Recently treated with oral prednisone therapy 2 weeks ago and recurrent symptoms.  In the emergency room hemodynamically stable.  CT head with no acute findings.  Chest x-ray with no acute findings.  COVID-19 and influenza test negative.  Admitted to hospital with COPD exacerbation.   Assessment & Plan:   Active Problems:   COPD with acute exacerbation ([place])  COPD with acute exacerbation: Agree with admission to monitored unit because of severity of symptoms. Aggressive bronchodilator therapy, IV steroids, inhalational steroids, scheduled and as needed bronchodilators, deep breathing exercises, incentive spirometry, chest physiotherapy and respiratory therapy consult. Antibiotics due to severity of symptoms.  Will treat with 5 days of azithromycin. Supplemental oxygen to keep saturations more than 92%. Start mobilizing with PT OT.  Chronic headache: CT scan negative for acute abnormalities.  Hyperlipidemia: On a statin continue.  Hypertension: Blood pressure stable on amlodipine that is continued.  Pulmonary nodules: Follows up with pulmonary as outpatient.   DVT prophylaxis: enoxaparin (LOVENOX) injection 40 mg Start: 09/22/20 1530   Code Status: DNR/DNI Family Communication: None, called patient's son, unable to talk  Disposition Plan: Status is: Inpatient  Remains inpatient appropriate because:Inpatient level of care appropriate due to severity of illness   Dispo: The patient is from: ALF              Anticipated d/c is to: ALF              Anticipated d/c date is: 2 days              Patient currently is not medically stable to  d/c.         Consultants:   None  Procedures:   None  Antimicrobials:   Azithromycin, 12/29---   Subjective: Patient seen and examined.  She was still in the emergency room.  She was worried about [facility]inet ER.  She has ongoing cough and difficulty breathing.  She feels very weak. She feels like she is gaining weight.  Objective: Vitals:   09/23/20 0700 09/23/20 0730 09/23/20 0816 09/23/20 0830  BP: (!) 176/130 (!) 160/73  (!) 176/75  Pulse: 100   (!) 103  Resp: 15 20  15   Temp:    98.2 F (36.8 C)  TempSrc:    Oral  SpO2: 100%  100% 97%  Weight:      Height:        Intake/Output Summary (Last 24 hours) at 09/23/2020 1226 Last data filed at 09/23/2020 0153 Gross per 24 hour  Intake 652.4 ml  Output --  Net 652.4 ml   Filed Weights   09/22/20 0935  Weight: 65.3 kg    Examination:  General exam: Chronically sick looking, however looks comfortable on 4 L oxygen without distress. Respiratory system: Expiratory wheezing present.  No crackles. Cardiovascular system: S1 & S2 heard, RRR. No JVD, murmurs, rubs, gallops or clicks. No pedal edema. Gastrointestinal system: Abdomen is nondistended, soft and nontender. No organomegaly or masses felt. Normal bowel sounds heard. Central nervous system: Alert and oriented. No focal neurological deficits. Extremities: Symmetric 5 x 5 power. Skin: No rashes, lesions or ulcers Psychiatry: Judgement and insight appear normal. Mood &  affect appropriate.     Data Reviewed: I have personally reviewed following labs and imaging studies  CBC: Recent Labs  Lab 09/22/20 1014 09/23/20 0553  WBC 12.1* 19.6*  NEUTROABS 9.7*  --   HGB 10.8* 11.3*  HCT 35.7* 36.0  MCV 95.5 93.3  PLT 256 761   Basic Metabolic Panel: Recent Labs  Lab 09/22/20 1014 09/23/20 0553  NA 138 138  K 4.7 4.1  CL 98 98  CO2 31 27  GLUCOSE 121* 165*  BUN 6* 10  CREATININE 0.59 0.67  CALCIUM 9.5 10.0  MG  --  2.1   GFR: Estimated  Creatinine Clearance: 55 mL/min (by C-G formula based on SCr of 0.67 mg/dL). Liver Function Tests: Recent Labs  Lab 09/22/20 1014  AST 15  ALT 14  ALKPHOS 83  BILITOT 0.4  PROT 6.6  ALBUMIN 3.8   Recent Labs  Lab 09/22/20 1014  LIPASE 24   No results for input(s): AMMONIA in the last 168 hours. Coagulation Profile: No results for input(s): INR, PROTIME in the last 168 hours. Cardiac Enzymes: No results for input(s): CKTOTAL, CKMB, CKMBINDEX, TROPONINI in the last 168 hours. BNP (last 3 results) No results for input(s): PROBNP in the last 8760 hours. HbA1C: No results for input(s): HGBA1C in the last 72 hours. CBG: No results for input(s): GLUCAP in the last 168 hours. Lipid Profile: No results for input(s): CHOL, HDL, LDLCALC, TRIG, CHOLHDL, LDLDIRECT in the last 72 hours. Thyroid Function Tests: No results for input(s): TSH, T4TOTAL, FREET4, T3FREE, THYROIDAB in the last 72 hours. Anemia Panel: No results for input(s): VITAMINB12, FOLATE, FERRITIN, TIBC, IRON, RETICCTPCT in the last 72 hours. Sepsis Labs: Recent Labs  Lab 09/22/20 Ross <0.10    Recent Results (from the past 240 hour(s))  Resp Panel by RT-PCR (Flu A&B, Covid) Nasopharyngeal Swab     Status: None   Collection Time: 09/22/20  9:41 AM   Specimen: Nasopharyngeal Swab; Nasopharyngeal(NP) swabs in vial transport medium  Result Value Ref Range Status   SARS Coronavirus 2 by RT PCR NEGATIVE NEGATIVE Final    Comment: (NOTE) SARS-CoV-2 target nucleic acids are NOT DETECTED.  The SARS-CoV-2 RNA is generally detectable in upper respiratory specimens during the acute phase of infection. The lowest concentration of SARS-CoV-2 viral copies this assay can detect is 138 copies/mL. A negative result does not preclude SARS-Cov-2 infection and should not be used as the sole basis for treatment or other patient management decisions. A negative result may occur with  improper specimen  collection/handling, submission of specimen other than nasopharyngeal swab, presence of viral mutation(s) within the areas targeted by this assay, and inadequate number of viral copies(<138 copies/mL). A negative result must be combined with clinical observations, patient history, and epidemiological information. The expected result is Negative.  Fact Sheet for Patients:  EntrepreneurPulse.com.au  Fact Sheet for Healthcare Providers:  IncredibleEmployment.be  This test is no t yet approved or cleared by the [place] FDA and  has been authorized for detection and/or diagnosis of SARS-CoV-2 by FDA under an Emergency Use Authorization (EUA). This EUA will remain  in effect (meaning this test can be used) for the duration of the COVID-19 declaration under Section 564(b)(1) of the Act, 21 U.S.C.section 360bbb-3(b)(1), unless the authorization is terminated  or revoked sooner.       Influenza A by PCR NEGATIVE NEGATIVE Final   Influenza B by PCR NEGATIVE NEGATIVE Final    Comment: (NOTE) The Xpert Xpress SARS-CoV-2/FLU/RSV  plus assay is intended as an aid in the diagnosis of influenza from Nasopharyngeal swab specimens and should not be used as a sole basis for treatment. Nasal washings and aspirates are unacceptable for Xpert Xpress SARS-CoV-2/FLU/RSV testing.  Fact Sheet for Patients: EntrepreneurPulse.com.au  Fact Sheet for Healthcare Providers: IncredibleEmployment.be  This test is not yet approved or cleared by the [place] FDA and has been authorized for detection and/or diagnosis of SARS-CoV-2 by FDA under an Emergency Use Authorization (EUA). This EUA will remain in effect (meaning this test can be used) for the duration of the COVID-19 declaration under Section 564(b)(1) of the Act, 21 U.S.C. section 360bbb-3(b)(1), unless the authorization is terminated or revoked.  Performed at [facility], [address] [place], [place] [address]          Radiology Studies: CT Head Wo Contrast  Result Date: 09/22/2020 CLINICAL DATA:  Headache, new or worsening (Age >= 50y) EXAM: CT HEAD WITHOUT CONTRAST TECHNIQUE: Contiguous axial images were obtained from the base of the skull through the vertex without intravenous contrast. COMPARISON:  09/21/2016. FINDINGS: Brain: No acute infarct or intracranial hemorrhage. No mass lesion. No midline shift, ventriculomegaly or extra-axial fluid collection. Vascular: No hyperdense vessel or unexpected calcification. Bilateral skull base atherosclerotic calcifications. Skull: Negative for fracture or focal lesion. Sinuses/Orbits: Normal orbits. Clear paranasal sinuses. No mastoid effusion. Other: None. IMPRESSION: No acute intracranial process. Electronically Signed   By: Primitivo Gauze M.D.   On: 09/22/2020 12:04   DG Chest Port 1 View  Result Date: 09/22/2020 CLINICAL DATA:  Shortness of breath, wheezing. EXAM: PORTABLE CHEST 1 VIEW COMPARISON:  September 07, 2020. FINDINGS: The heart size and mediastinal contours are within normal limits. Both lungs are clear. No pneumothorax or pleural effusion is noted. The visualized skeletal structures are unremarkable. IMPRESSION: No active disease. Electronically Signed   By: Marijo Conception M.D.   On: 09/22/2020 09:57        Scheduled Meds: . ALPRAZolam  0.5 mg Oral BID  . amLODipine  5 mg Oral Daily  . ascorbic acid  500 mg Oral Daily  . atorvastatin  40 mg Oral Daily  . azithromycin  500 mg Oral Daily  . baclofen  10 mg Oral Daily  . budesonide (PULMICORT) nebulizer solution  0.25 mg Nebulization BID  . busPIRone  7.5 mg Oral BID  . cholecalciferol  1,000 Units Oral Daily  . enoxaparin (LOVENOX) injection  40 mg Subcutaneous Q24H  . ferrous sulfate  325 mg Oral BID WC  . fluticasone  1 spray Each Nare Daily  . guaiFENesin  600 mg Oral BID  . ipratropium-albuterol  3 mL Nebulization Q6H   . loratadine  10 mg Oral Daily  . melatonin  3 mg Oral QHS  . methylPREDNISolone (SOLU-MEDROL) injection  40 mg Intravenous Q12H  . sertraline  50 mg Oral Daily  . sodium chloride flush  3 mL Intravenous Q12H  . zinc sulfate  220 mg Oral Daily   Continuous Infusions: . sodium chloride       LOS: 1 day    Time spent: 30 minutes    Barb Merino, MD [facility] Pager [phone]

## 2020-09-24 DIAGNOSIS — J441 Chronic obstructive pulmonary disease with (acute) exacerbation: Secondary | ICD-10-CM | POA: Diagnosis not present

## 2020-09-24 LAB — CBC WITH DIFFERENTIAL/PLATELET
Abs Immature Granulocytes: 0.24 10*3/uL — ABNORMAL HIGH (ref 0.00–0.07)
Basophils Absolute: 0 10*3/uL (ref 0.0–0.1)
Basophils Relative: 0 %
Eosinophils Absolute: 0 10*3/uL (ref 0.0–0.5)
Eosinophils Relative: 0 %
HCT: 33.3 % — ABNORMAL LOW (ref 36.0–46.0)
Hemoglobin: 10.3 g/dL — ABNORMAL LOW (ref 12.0–15.0)
Immature Granulocytes: 1 %
Lymphocytes Relative: 5 %
Lymphs Abs: 1.2 10*3/uL (ref 0.7–4.0)
MCH: 29 pg (ref 26.0–34.0)
MCHC: 30.9 g/dL (ref 30.0–36.0)
MCV: 93.8 fL (ref 80.0–100.0)
Monocytes Absolute: 0.7 10*3/uL (ref 0.1–1.0)
Monocytes Relative: 3 %
Neutro Abs: 21.5 10*3/uL — ABNORMAL HIGH (ref 1.7–7.7)
Neutrophils Relative %: 91 %
Platelets: 262 10*3/uL (ref 150–400)
RBC: 3.55 MIL/uL — ABNORMAL LOW (ref 3.87–5.11)
RDW: 13.3 % (ref 11.5–15.5)
WBC: 23.6 10*3/uL — ABNORMAL HIGH (ref 4.0–10.5)
nRBC: 0 % (ref 0.0–0.2)

## 2020-09-24 MED ORDER — SODIUM CHLORIDE 0.9 % IV SOLN
1.0000 g | INTRAVENOUS | Status: DC
  Administered 2020-09-24 – 2020-09-25 (×2): 1 g via INTRAVENOUS
  Filled 2020-09-24 (×2): qty 10

## 2020-09-24 NOTE — TOC Progression Note (Signed)
Transition of Care [facility]) - Progression Note   Patient Details  Name: Lindsey Combs MRN: [id number] Date of Birth: [birth year]  Transition of Care [facility]) CM/SW [place], LCSW Phone Number: 09/24/2020, 11:21 AM  Clinical Narrative: PT evaluation recommended HHPT. CSW called [place] to confirm the facility uses [facility] for [facility] services. HH referral made to [place] with [facility]. TOC to follow.  Expected Discharge Plan: Assisted Living Barriers to Discharge: Continued Medical Work up  Expected Discharge Plan and Services Expected Discharge Plan: Assisted Living In-house Referral: Clinical Social Work Discharge Planning Services: NA Post Acute Care Choice: NA Living arrangements for the past 2 months: [place]             DME Arranged: N/A DME Agency: NA HH Arranged: PT [place] Agency: [facility] Date [place]: 09/24/20 Time [place]: 33 Representative spoke with at [place]: Glennis Brink  Readmission Risk Interventions No flowsheet data found.

## 2020-09-24 NOTE — Evaluation (Signed)
Occupational Therapy Evaluation Patient Details Name: Lindsey Combs MRN: [id number] DOB: [birth year] Today's Date: 09/24/2020    History of Present Illness Lindsey KOENEMAN is a 74 y.o. female with medical history significant for COPD with chronic hypoxemia on 4 L nasal cannula oxygen, anemia of inflammation, distant history of uvular cancer, pulmonary nodule with suspected lung cancer, hypertension, dyslipidemia, GERD, and anxiety who presented to the ED with worsening shortness of breath and wheezing over the past several days.  She has been using her home inhaler medications without significant relief.  She also reports feeling somewhat febrile.  She also is noted to have a cough that is sometimes productive of whitish sputum and has had Covid twice, but does get checked weekly and her last negative test was 5 days ago.  She denies any chest pain.  She is also noticing a left-sided headache for the past month or so with persistent nausea, but no emesis and has had an approximately 2 pound weight loss.   Clinical Impression   Pt agreeable to OT evaluation this am, performing tasks with supervision to set-up. Coughing limiting tolerance for standing tasks. Pt encouraged to work on PLB between coughing episodes and was able to calm and sit comfortably in chair. Pt is at baseline for ADL completion, recommend return to ALF with assist from staff as needed.      Follow Up Recommendations  No OT follow up;Supervision - Intermittent    Equipment Recommendations  None recommended by OT       Precautions / Restrictions Precautions Precautions: Fall Restrictions Weight Bearing Restrictions: No      Mobility Bed Mobility Overal bed mobility: Independent                  Transfers Overall transfer level: Needs assistance Equipment used: Rolling walker (2 wheeled) Transfers: Sit to/from [facility] Sit to Stand: Supervision Stand pivot transfers: Supervision                 ADL either performed or assessed with clinical judgement   ADL Overall ADL's : Needs assistance/impaired Eating/Feeding: Modified independent;Sitting   Grooming: Wash/dry hands;Wash/dry face;Set up;Sitting Grooming Details (indicate cue type and reason): pt performing with set-up while seated in chair, unable to perform in standing due to coughing         Upper Body Dressing : Modified independent;Sitting   Lower Body Dressing: Supervision/safety;Sitting/lateral leans   Toilet Transfer: Supervision/safety;Stand-pivot;RW Toilet Transfer Details (indicate cue type and reason): simulated with transfer to chair                 Vision Baseline Vision/History: No visual deficits Patient Visual Report: No change from baseline Vision Assessment?: No apparent visual deficits            Pertinent Vitals/Pain Pain Assessment: No/denies pain     Hand Dominance Right   Extremity/Trunk Assessment Upper Extremity Assessment Upper Extremity Assessment: Overall WFL for tasks assessed   Lower Extremity Assessment Lower Extremity Assessment: Defer to PT evaluation   Cervical / Trunk Assessment Cervical / Trunk Assessment: Normal   Communication Communication Communication: No difficulties   Cognition Arousal/Alertness: Awake/alert Behavior During Therapy: WFL for tasks assessed/performed Overall Cognitive Status: Within Functional Limits for tasks assessed  Home Living Family/patient expects to be discharged to:: Assisted living                             Home Equipment: Walker - 4 wheels   Additional Comments: O2 dependent at 4 L      Prior Functioning/Environment Level of Independence: Independent with assistive device(s)        Comments: Patient reports she typically performs mobility with rollator and has assistance prn for ADLs from ALF staff        OT Problem  List: Cardiopulmonary status limiting activity       End of Session Equipment Utilized During Treatment: Rolling walker;Oxygen  Activity Tolerance: Other (comment) (limited by coughing) Patient left: in chair;with call bell/phone within reach;with chair alarm set  OT Visit Diagnosis: Muscle weakness (generalized) (M62.81)                Time: 0233-4356 OT Time Calculation (min): 28 min Charges:  OT General Charges $OT Visit: 1 Visit OT Evaluation $OT Eval Low Complexity: 1 Low   Guadelupe Sabin, OTR/L  [phone] 09/24/2020, 8:16 AM

## 2020-09-24 NOTE — Progress Notes (Signed)
PROGRESS NOTE    RIONNA FELTES  AOZ:[id number] DOB: 1946/05/08 DOA: 09/22/2020 PCP: [name], MD    Brief Narrative:  74 year old female with history of COPD and chronic hypoxemia on 4 L nasal cannula oxygen, chronic anemia, hypertension, hyperlipidemia, GERD and anxiety presented to ER with worsening shortness of breath and wheezing.  Recently treated with oral prednisone therapy 2 weeks ago and recurrent symptoms.  In the emergency room hemodynamically stable.  CT head with no acute findings.  Chest x-ray with no acute findings.  COVID-19 and influenza test negative.  Admitted to hospital with COPD exacerbation.   Assessment & Plan:   Active Problems:   COPD with acute exacerbation ([place])  COPD with acute exacerbation: Continue monitoring in the hospital because of severity of symptoms.   Aggressive bronchodilator therapy, IV steroids, inhalational steroids, scheduled and as needed bronchodilators, deep breathing exercises, incentive spirometry, chest physiotherapy and respiratory therapy consult. Leukocytosis is probably due to steroid use, however she has neutrophilic leukocytosis.  Will use Rocephin and azithromycin for 5 days. Supplemental oxygen to keep saturations more than 92%. Continue to work with PT OT.  Chronic headache: CT scan negative for acute abnormalities.  Hyperlipidemia: On a statin, continue.  Hypertension: Blood pressure stable on amlodipine that is continued.  Pulmonary nodules: Follows up with pulmonary as outpatient.   DVT prophylaxis: enoxaparin (LOVENOX) injection 40 mg Start: 09/22/20 1530   Code Status: DNR/DNI Family Communication: Called and updated patient's son. Disposition Plan: Status is: Inpatient  Remains inpatient appropriate because:Inpatient level of care appropriate due to severity of illness   Dispo: The patient is from: ALF              Anticipated d/c is to: ALF              Anticipated d/c date is: 2 days               Patient currently is not medically stable to d/c.         Consultants:   None  Procedures:   None  Antimicrobials:   Azithromycin, 12/29---  Rocephin, 12/30--   Subjective: Patient seen and examined.  No overnight events.  She still feels tight and wheezing and congested.  Was happy to be mobilized.  Remains afebrile.  Objective: Vitals:   09/23/20 2030 09/24/20 0247 09/24/20 0555 09/24/20 0700  BP: (!) 159/67  (!) 138/52   Pulse: 100  76   Resp: 20  16   Temp: 98.6 F (37 C)  98.2 F (36.8 C)   TempSrc: Oral     SpO2: 100% 99% 98% 97%  Weight:      Height:        Intake/Output Summary (Last 24 hours) at 09/24/2020 1345 Last data filed at 09/24/2020 1000 Gross per 24 hour  Intake 340 ml  Output 1300 ml  Net -960 ml   Filed Weights   09/22/20 0935  Weight: 65.3 kg    Examination:  General exam: Chronically sick looking.  Comfortable on 3 L oxygen. SpO2: 97 % O2 Flow Rate (L/min): 3 L/min  Respiratory system: Poor bilateral air entry.  Expiratory wheezes.  No crackles. Cardiovascular system: S1 & S2 heard, RRR. No JVD, murmurs, rubs, gallops or clicks. No pedal edema. Gastrointestinal system: Abdomen is nondistended, soft and nontender. No organomegaly or masses felt. Normal bowel sounds heard. Central nervous system: Alert and oriented. No focal neurological deficits. Extremities: Symmetric 5 x 5 power. Skin: No rashes, lesions or  ulcers Psychiatry: Judgement and insight appear normal. Mood & affect appropriate.     Data Reviewed: I have personally reviewed following labs and imaging studies  CBC: Recent Labs  Lab 09/22/20 1014 09/23/20 0553 09/24/20 0425  WBC 12.1* 19.6* 23.6*  NEUTROABS 9.7*  --  21.5*  HGB 10.8* 11.3* 10.3*  HCT 35.7* 36.0 33.3*  MCV 95.5 93.3 93.8  PLT 256 305 417   Basic Metabolic Panel: Recent Labs  Lab 09/22/20 1014 09/23/20 0553  NA 138 138  K 4.7 4.1  CL 98 98  CO2 31 27  GLUCOSE 121* 165*  BUN 6*  10  CREATININE 0.59 0.67  CALCIUM 9.5 10.0  MG  --  2.1   GFR: Estimated Creatinine Clearance: 55 mL/min (by C-G formula based on SCr of 0.67 mg/dL). Liver Function Tests: Recent Labs  Lab 09/22/20 1014  AST 15  ALT 14  ALKPHOS 83  BILITOT 0.4  PROT 6.6  ALBUMIN 3.8   Recent Labs  Lab 09/22/20 1014  LIPASE 24   No results for input(s): AMMONIA in the last 168 hours. Coagulation Profile: No results for input(s): INR, PROTIME in the last 168 hours. Cardiac Enzymes: No results for input(s): CKTOTAL, CKMB, CKMBINDEX, TROPONINI in the last 168 hours. BNP (last 3 results) No results for input(s): PROBNP in the last 8760 hours. HbA1C: No results for input(s): HGBA1C in the last 72 hours. CBG: No results for input(s): GLUCAP in the last 168 hours. Lipid Profile: No results for input(s): CHOL, HDL, LDLCALC, TRIG, CHOLHDL, LDLDIRECT in the last 72 hours. Thyroid Function Tests: No results for input(s): TSH, T4TOTAL, FREET4, T3FREE, THYROIDAB in the last 72 hours. Anemia Panel: No results for input(s): VITAMINB12, FOLATE, FERRITIN, TIBC, IRON, RETICCTPCT in the last 72 hours. Sepsis Labs: Recent Labs  Lab 09/22/20 [place] <0.10    Recent Results (from the past 240 hour(s))  Resp Panel by RT-PCR (Flu A&B, Covid) Nasopharyngeal Swab     Status: None   Collection Time: 09/22/20  9:41 AM   Specimen: Nasopharyngeal Swab; Nasopharyngeal(NP) swabs in vial transport medium  Result Value Ref Range Status   SARS Coronavirus 2 by RT PCR NEGATIVE NEGATIVE Final    Comment: (NOTE) SARS-CoV-2 target nucleic acids are NOT DETECTED.  The SARS-CoV-2 RNA is generally detectable in upper respiratory specimens during the acute phase of infection. The lowest concentration of SARS-CoV-2 viral copies this assay can detect is 138 copies/mL. A negative result does not preclude SARS-Cov-2 infection and should not be used as the sole basis for treatment or other patient management  decisions. A negative result may occur with  improper specimen collection/handling, submission of specimen other than nasopharyngeal swab, presence of viral mutation(s) within the areas targeted by this assay, and inadequate number of viral copies(<138 copies/mL). A negative result must be combined with clinical observations, patient history, and epidemiological information. The expected result is Negative.  Fact Sheet for Patients:  EntrepreneurPulse.com.au  Fact Sheet for Healthcare Providers:  IncredibleEmployment.be  This test is no t yet approved or cleared by the [place] FDA and  has been authorized for detection and/or diagnosis of SARS-CoV-2 by FDA under an Emergency Use Authorization (EUA). This EUA will remain  in effect (meaning this test can be used) for the duration of the COVID-19 declaration under Section 564(b)(1) of the Act, 21 U.S.C.section 360bbb-3(b)(1), unless the authorization is terminated  or revoked sooner.       Influenza A by PCR NEGATIVE NEGATIVE Final  Influenza B by PCR NEGATIVE NEGATIVE Final    Comment: (NOTE) The Xpert Xpress SARS-CoV-2/FLU/RSV plus assay is intended as an aid in the diagnosis of influenza from Nasopharyngeal swab specimens and should not be used as a sole basis for treatment. Nasal washings and aspirates are unacceptable for Xpert Xpress SARS-CoV-2/FLU/RSV testing.  Fact Sheet for Patients: EntrepreneurPulse.com.au  Fact Sheet for Healthcare Providers: IncredibleEmployment.be  This test is not yet approved or cleared by the [place] FDA and has been authorized for detection and/or diagnosis of SARS-CoV-2 by FDA under an Emergency Use Authorization (EUA). This EUA will remain in effect (meaning this test can be used) for the duration of the COVID-19 declaration under Section 564(b)(1) of the Act, 21 U.S.C. section 360bbb-3(b)(1), unless the  authorization is terminated or revoked.  Performed at [facility], [address] [place], [place] [address]          Radiology Studies: No results found.      Scheduled Meds: . ALPRAZolam  0.5 mg Oral BID  . amLODipine  5 mg Oral Daily  . ascorbic acid  500 mg Oral Daily  . atorvastatin  40 mg Oral Daily  . azithromycin  500 mg Oral Daily  . baclofen  10 mg Oral Daily  . budesonide (PULMICORT) nebulizer solution  0.25 mg Nebulization BID  . busPIRone  7.5 mg Oral BID  . cholecalciferol  1,000 Units Oral Daily  . enoxaparin (LOVENOX) injection  40 mg Subcutaneous Q24H  . ferrous sulfate  325 mg Oral BID WC  . fluticasone  1 spray Each Nare Daily  . guaiFENesin  600 mg Oral BID  . ipratropium-albuterol  3 mL Nebulization Q6H  . loratadine  10 mg Oral Daily  . melatonin  3 mg Oral QHS  . methylPREDNISolone (SOLU-MEDROL) injection  40 mg Intravenous Q12H  . sertraline  50 mg Oral Daily  . sodium chloride flush  3 mL Intravenous Q12H  . zinc sulfate  220 mg Oral Daily   Continuous Infusions: . sodium chloride    . cefTRIAXone (ROCEPHIN)  IV Stopped (09/24/20 1000)     LOS: 2 days    Time spent: 30 minutes    Barb Merino, MD [facility] Pager [phone]

## 2020-09-25 DIAGNOSIS — J441 Chronic obstructive pulmonary disease with (acute) exacerbation: Secondary | ICD-10-CM | POA: Diagnosis not present

## 2020-09-25 LAB — CBC WITH DIFFERENTIAL/PLATELET
Abs Immature Granulocytes: 0.2 10*3/uL — ABNORMAL HIGH (ref 0.00–0.07)
Basophils Absolute: 0 10*3/uL (ref 0.0–0.1)
Basophils Relative: 0 %
Eosinophils Absolute: 0 10*3/uL (ref 0.0–0.5)
Eosinophils Relative: 0 %
HCT: 33.5 % — ABNORMAL LOW (ref 36.0–46.0)
Hemoglobin: 10.3 g/dL — ABNORMAL LOW (ref 12.0–15.0)
Immature Granulocytes: 1 %
Lymphocytes Relative: 8 %
Lymphs Abs: 1.3 10*3/uL (ref 0.7–4.0)
MCH: 29 pg (ref 26.0–34.0)
MCHC: 30.7 g/dL (ref 30.0–36.0)
MCV: 94.4 fL (ref 80.0–100.0)
Monocytes Absolute: 0.4 10*3/uL (ref 0.1–1.0)
Monocytes Relative: 3 %
Neutro Abs: 13.7 10*3/uL — ABNORMAL HIGH (ref 1.7–7.7)
Neutrophils Relative %: 88 %
Platelets: 299 10*3/uL (ref 150–400)
RBC: 3.55 MIL/uL — ABNORMAL LOW (ref 3.87–5.11)
RDW: 13.3 % (ref 11.5–15.5)
WBC: 15.6 10*3/uL — ABNORMAL HIGH (ref 4.0–10.5)
nRBC: 0 % (ref 0.0–0.2)

## 2020-09-25 MED ORDER — IPRATROPIUM-ALBUTEROL 0.5-2.5 (3) MG/3ML IN SOLN: 3.0000 mL | Freq: Four times a day (QID) | RESPIRATORY_TRACT | Status: DC

## 2020-09-25 MED ORDER — MUCINEX MAXIMUM STRENGTH 1200 MG PO TB12: 1.0000 | ORAL_TABLET | Freq: Two times a day (BID) | ORAL | 0 refills | Status: DC

## 2020-09-25 MED ORDER — PREDNISONE 10 MG PO TABS: ORAL_TABLET | ORAL | 0 refills | Status: DC

## 2020-09-25 MED ORDER — AZITHROMYCIN 250 MG PO TABS
250.0000 mg | ORAL_TABLET | Freq: Every day | ORAL | Status: AC
  Administered 2020-09-26 – 2020-09-27 (×2): 250 mg via ORAL
  Filled 2020-09-25 (×2): qty 1

## 2020-09-25 MED ORDER — ACETAMINOPHEN 325 MG PO TABS
650.0000 mg | ORAL_TABLET | ORAL | Status: DC | PRN
  Administered 2020-09-27: 650 mg via ORAL
  Filled 2020-09-25: qty 2

## 2020-09-25 MED ORDER — ARTIFICIAL TEARS 1 % OP SOLN: 1.0000 [drp] | OPHTHALMIC | 12 refills | Status: DC | PRN

## 2020-09-25 MED ORDER — ARTIFICIAL TEARS 1 % OP SOLN: 1.0000 [drp] | OPHTHALMIC | 12 refills | Status: AC | PRN

## 2020-09-25 MED ORDER — CEPHALEXIN 500 MG PO CAPS
500.0000 mg | ORAL_CAPSULE | Freq: Three times a day (TID) | ORAL | Status: DC
  Administered 2020-09-25 – 2020-09-27 (×7): 500 mg via ORAL
  Filled 2020-09-25 (×7): qty 1

## 2020-09-25 MED ORDER — MUCINEX MAXIMUM STRENGTH 1200 MG PO TB12: 1.0000 | ORAL_TABLET | Freq: Two times a day (BID) | ORAL | 0 refills | Status: DC | PRN

## 2020-09-25 MED ORDER — CEPHALEXIN 500 MG PO CAPS
500.0000 mg | ORAL_CAPSULE | Freq: Three times a day (TID) | ORAL | 0 refills | Status: DC
Start: 2020-09-25 — End: 2020-09-28

## 2020-09-25 MED ORDER — MELATONIN 10 MG PO TABS
10.0000 mg | ORAL_TABLET | Freq: Every day | ORAL | 0 refills | Status: DC
Start: 2020-09-25 — End: 2020-09-28

## 2020-09-25 NOTE — Progress Notes (Signed)
PROGRESS NOTE    Lindsey Combs  JSE:[id number] DOB: 1946/09/06 DOA: 09/22/2020 PCP: [name], MD    Brief Narrative:  74 year old female with history of COPD and chronic hypoxemia on 4 L nasal cannula oxygen, chronic anemia, hypertension, hyperlipidemia, GERD and anxiety presented to ER with worsening shortness of breath and wheezing.  Recently treated with oral prednisone therapy 2 weeks ago and recurrent symptoms.  In the emergency room hemodynamically stable.  CT head with no acute findings.  Chest x-ray with no acute findings.  COVID-19 and influenza test negative.  Admitted to hospital with COPD exacerbation. She has persistent wheezing and shortness of breath so remains in the hospital.   Assessment & Plan:   Active Problems:   COPD with acute exacerbation ([place])  COPD with acute exacerbation: Continue monitoring in the hospital because of severity of symptoms.  Continues to be symptomatic. Aggressive bronchodilator therapy, IV steroids, inhalational steroids, scheduled and as needed bronchodilators, deep breathing exercises, incentive spirometry, chest physiotherapy and respiratory therapy consult. Rocephin day 2/5, will change to oral Keflex.  Azithromycin day 4/5.  Decrease dose to 250 mg. Supplemental oxygen to keep saturations more than 92%. Continue to work with PT OT.  Chronic headache: CT scan negative for acute abnormalities.  Hyperlipidemia: On a statin, continue.  Hypertension: Blood pressure stable on amlodipine that is continued.  Pulmonary nodules: Follows up with pulmonary as outpatient.   DVT prophylaxis: enoxaparin (LOVENOX) injection 40 mg Start: 09/22/20 1530   Code Status: DNR/DNI Family Communication: Called and updated patient's son 12/30. Disposition Plan: Status is: Inpatient  Remains inpatient appropriate because:Inpatient level of care appropriate due to severity of illness   Dispo: The patient is from: ALF              Anticipated d/c  is to: ALF              Anticipated d/c date is: 2 days              Patient currently is not medically stable to d/c.         Consultants:   None  Procedures:   None  Antimicrobials:   Azithromycin, 12/29---  Rocephin, 12/30--12/31   Subjective: Patient seen and examined.  Audibly wheezing.  Still feels tired and chest tightness.  Objective: Vitals:   09/24/20 1935 09/24/20 2022 09/25/20 0518 09/25/20 1003  BP:  (!) 163/66 (!) 148/120   Pulse:  97 82   Resp:  18 18   Temp:  98.9 F (37.2 C) 98.9 F (37.2 C)   TempSrc:   Oral   SpO2: 94% 95% 98% 98%  Weight:      Height:        Intake/Output Summary (Last 24 hours) at 09/25/2020 1145 Last data filed at 09/24/2020 2100 Gross per 24 hour  Intake --  Output 1350 ml  Net -1350 ml   Filed Weights   09/22/20 0935  Weight: 65.3 kg    Examination:  General exam: Chronically sick looking.  Audibly wheezing.  On 3 to 4 L of oxygen. SpO2: 98 % O2 Flow Rate (L/min): 4 L/min  Respiratory system: Poor air entry.  Inspiratory and expiratory wheezing.  No crackles. Cardiovascular system: S1 & S2 heard, RRR. No JVD, murmurs, rubs, gallops or clicks. No pedal edema. Gastrointestinal system: Abdomen is nondistended, soft and nontender. No organomegaly or masses felt. Normal bowel sounds heard. Central nervous system: Alert and oriented. No focal neurological deficits. Extremities: Symmetric 5 x  5 power. Skin: No rashes, lesions or ulcers Psychiatry: Judgement and insight appear normal. Mood & affect appropriate.     Data Reviewed: I have personally reviewed following labs and imaging studies  CBC: Recent Labs  Lab 09/22/20 1014 09/23/20 0553 09/24/20 0425 09/25/20 0437  WBC 12.1* 19.6* 23.6* 15.6*  NEUTROABS 9.7*  --  21.5* 13.7*  HGB 10.8* 11.3* 10.3* 10.3*  HCT 35.7* 36.0 33.3* 33.5*  MCV 95.5 93.3 93.8 94.4  PLT 256 305 262 387   Basic Metabolic Panel: Recent Labs  Lab 09/22/20 1014  09/23/20 0553  NA 138 138  K 4.7 4.1  CL 98 98  CO2 31 27  GLUCOSE 121* 165*  BUN 6* 10  CREATININE 0.59 0.67  CALCIUM 9.5 10.0  MG  --  2.1   GFR: Estimated Creatinine Clearance: 55 mL/min (by C-G formula based on SCr of 0.67 mg/dL). Liver Function Tests: Recent Labs  Lab 09/22/20 1014  AST 15  ALT 14  ALKPHOS 83  BILITOT 0.4  PROT 6.6  ALBUMIN 3.8   Recent Labs  Lab 09/22/20 1014  LIPASE 24   No results for input(s): AMMONIA in the last 168 hours. Coagulation Profile: No results for input(s): INR, PROTIME in the last 168 hours. Cardiac Enzymes: No results for input(s): CKTOTAL, CKMB, CKMBINDEX, TROPONINI in the last 168 hours. BNP (last 3 results) No results for input(s): PROBNP in the last 8760 hours. HbA1C: No results for input(s): HGBA1C in the last 72 hours. CBG: No results for input(s): GLUCAP in the last 168 hours. Lipid Profile: No results for input(s): CHOL, HDL, LDLCALC, TRIG, CHOLHDL, LDLDIRECT in the last 72 hours. Thyroid Function Tests: No results for input(s): TSH, T4TOTAL, FREET4, T3FREE, THYROIDAB in the last 72 hours. Anemia Panel: No results for input(s): VITAMINB12, FOLATE, FERRITIN, TIBC, IRON, RETICCTPCT in the last 72 hours. Sepsis Labs: Recent Labs  Lab 09/22/20 [place] <0.10    Recent Results (from the past 240 hour(s))  Resp Panel by RT-PCR (Flu A&B, Covid) Nasopharyngeal Swab     Status: None   Collection Time: 09/22/20  9:41 AM   Specimen: Nasopharyngeal Swab; Nasopharyngeal(NP) swabs in vial transport medium  Result Value Ref Range Status   SARS Coronavirus 2 by RT PCR NEGATIVE NEGATIVE Final    Comment: (NOTE) SARS-CoV-2 target nucleic acids are NOT DETECTED.  The SARS-CoV-2 RNA is generally detectable in upper respiratory specimens during the acute phase of infection. The lowest concentration of SARS-CoV-2 viral copies this assay can detect is 138 copies/mL. A negative result does not preclude  SARS-Cov-2 infection and should not be used as the sole basis for treatment or other patient management decisions. A negative result may occur with  improper specimen collection/handling, submission of specimen other than nasopharyngeal swab, presence of viral mutation(s) within the areas targeted by this assay, and inadequate number of viral copies(<138 copies/mL). A negative result must be combined with clinical observations, patient history, and epidemiological information. The expected result is Negative.  Fact Sheet for Patients:  EntrepreneurPulse.com.au  Fact Sheet for Healthcare Providers:  IncredibleEmployment.be  This test is no t yet approved or cleared by the [place] FDA and  has been authorized for detection and/or diagnosis of SARS-CoV-2 by FDA under an Emergency Use Authorization (EUA). This EUA will remain  in effect (meaning this test can be used) for the duration of the COVID-19 declaration under Section 564(b)(1) of the Act, 21 U.S.C.section 360bbb-3(b)(1), unless the authorization is terminated  or revoked  sooner.       Influenza A by PCR NEGATIVE NEGATIVE Final   Influenza B by PCR NEGATIVE NEGATIVE Final    Comment: (NOTE) The Xpert Xpress SARS-CoV-2/FLU/RSV plus assay is intended as an aid in the diagnosis of influenza from Nasopharyngeal swab specimens and should not be used as a sole basis for treatment. Nasal washings and aspirates are unacceptable for Xpert Xpress SARS-CoV-2/FLU/RSV testing.  Fact Sheet for Patients: EntrepreneurPulse.com.au  Fact Sheet for Healthcare Providers: IncredibleEmployment.be  This test is not yet approved or cleared by the [place] FDA and has been authorized for detection and/or diagnosis of SARS-CoV-2 by FDA under an Emergency Use Authorization (EUA). This EUA will remain in effect (meaning this test can be used) for the duration of  the COVID-19 declaration under Section 564(b)(1) of the Act, 21 U.S.C. section 360bbb-3(b)(1), unless the authorization is terminated or revoked.  Performed at [facility], [address] [place], [place] [address]          Radiology Studies: No results found.      Scheduled Meds: . ALPRAZolam  0.5 mg Oral BID  . amLODipine  5 mg Oral Daily  . ascorbic acid  500 mg Oral Daily  . atorvastatin  40 mg Oral Daily  . [START ON 09/26/2020] azithromycin  250 mg Oral Daily  . baclofen  10 mg Oral Daily  . budesonide (PULMICORT) nebulizer solution  0.25 mg Nebulization BID  . busPIRone  7.5 mg Oral BID  . cephALEXin  500 mg Oral Q8H  . cholecalciferol  1,000 Units Oral Daily  . enoxaparin (LOVENOX) injection  40 mg Subcutaneous Q24H  . ferrous sulfate  325 mg Oral BID WC  . fluticasone  1 spray Each Nare Daily  . guaiFENesin  600 mg Oral BID  . ipratropium-albuterol  3 mL Nebulization Q6H  . loratadine  10 mg Oral Daily  . melatonin  3 mg Oral QHS  . methylPREDNISolone (SOLU-MEDROL) injection  40 mg Intravenous Q12H  . sertraline  50 mg Oral Daily  . sodium chloride flush  3 mL Intravenous Q12H  . zinc sulfate  220 mg Oral Daily   Continuous Infusions: . sodium chloride       LOS: 3 days    Time spent: 30 minutes    Barb Merino, MD [facility] Pager [phone]

## 2020-09-25 NOTE — NC FL2 (Addendum)
[place] LEVEL OF CARE SCREENING TOOL     IDENTIFICATION  Patient Name: Lindsey Combs Birthdate: 1946/07/12 Sex: female Admission Date (Current Location): 09/22/2020  [facility] and [place] Number:  [employer] and Address:  [place] [address], [place]      Provider Number: [id number]  Attending Physician Name and Address:  Barb Merino, MD  Relative Name and Phone Number:  Dailah Opperman (son) Ph: [phone]    Current Level of Care: Hospital Recommended Level of Care: [place] ([facility]) Prior Approval Number:    Date Approved/Denied:   PASRR Number:    Discharge Plan: Other (Comment) ([facility])    Current Diagnoses: Patient Active Problem List   Diagnosis Date Noted  . Chronic respiratory failure with hypoxia ([place]) 07/27/2020  . Mass of right lung 07/27/2020  . PAD (peripheral artery disease) ([place]) 08/13/2019  . H/O colonoscopy with polypectomy 04/25/2019  . Acute on chronic respiratory failure with hypoxia ([place]) 05/04/2018  . Severe protein-calorie malnutrition ([place]) 05/04/2018  . Closed right hip fracture ([place]) greater Trochanter 07/13/17 07/13/2017  . Vomiting and diarrhea 07/13/2017  . Fall at home, initial encounter 07/13/2017  . Hip fracture ([place]) 07/13/2017  . COPD with acute exacerbation ([place]) 02/16/2017  . Tobacco abuse 02/16/2017  . Malnutrition of moderate degree 09/22/2016  . Hypomagnesemia 09/21/2016  . Tobacco use 08/03/2016  . Hyperlipidemia 08/03/2016  . Hypertension 08/03/2016  . History of Helicobacter pylori infection 08/03/2016  . History of anemia 08/03/2016  . Diverticulosis 08/03/2016  . Arthritis 08/03/2016  . 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 Chronic obstructive pulmonary disease with acute exacerbation ([place])   . Acute respiratory failure with hypoxia ([place]) 04/03/2015  . COPD exacerbation ([place]) 04/03/2015  . Hyponatremia 04/03/2015  .  Dehydration 04/03/2015  . Hyperglycemia 04/03/2015  . Anxiety   . COPD GOLD ? / 02 dep    . Peripheral neuropathy ([place])   . Tobacco dependence   . Back pain, chronic 04/19/2013  . Abnormal weight loss 02/19/2013  . Anemia, iron deficiency 02/19/2013  . GERD (gastroesophageal reflux disease) 02/19/2013    Orientation RESPIRATION BLADDER Height & Weight     Self,Time,Situation,Place  O2 (4L/min) Incontinent Weight: 144 lb (65.3 kg) Height:  5' 5.5" (166.4 cm)  BEHAVIORAL SYMPTOMS/MOOD NEUROLOGICAL BOWEL NUTRITION STATUS      Continent Diet (See discharge summary)  AMBULATORY STATUS COMMUNICATION OF NEEDS Skin   Limited Assist Verbally Normal                       Personal Care Assistance Level of Assistance  Bathing,Feeding,Dressing Bathing Assistance: Limited assistance Feeding assistance: Independent Dressing Assistance: Limited assistance     Functional Limitations Info  Sight,Hearing,Speech Sight Info: Adequate Hearing Info: Adequate Speech Info: Adequate    SPECIAL CARE FACTORS FREQUENCY  PT (By licensed PT)     PT Frequency: 3x's/week              Contractures Contractures Info: Not present    Additional Factors Info  Code Status,Allergies,Psychotropic Code Status Info: DNR Allergies Info: Prednisone; Chantix (Varenicline); Codeine Psychotropic Info: Zoloft (sertraline); BuSpar (buspirone); Xanax (alprazolam)         Current Medications (09/25/2020):  This is the current hospital active medication list Current Facility-Administered Medications  Medication Dose Route Frequency Provider Last Rate Last Admin  . 0.9 %  sodium chloride infusion  250 mL Intravenous PRN Heath Lark D,  DO      . acetaminophen (TYLENOL) tablet 650 mg  650 mg Oral Q4H PRN Barb Merino, MD      . albuterol (PROVENTIL) (2.5 MG/3ML) 0.083% nebulizer solution 2.5 mg  2.5 mg Nebulization Q2H PRN Barb Merino, MD      . ALPRAZolam Duanne Moron) tablet 0.5 mg  0.5 mg Oral BID  Manuella Ghazi, Pratik D, DO   0.5 mg at 09/25/20 0906  . amLODipine (NORVASC) tablet 5 mg  5 mg Oral Daily Manuella Ghazi, Pratik D, DO   5 mg at 09/25/20 0907  . ascorbic acid (VITAMIN C) tablet 500 mg  500 mg Oral Daily Manuella Ghazi, Pratik D, DO   500 mg at 09/25/20 0907  . atorvastatin (LIPITOR) tablet 40 mg  40 mg Oral Daily Manuella Ghazi, Pratik D, DO   40 mg at 09/25/20 0907  . [START ON 09/26/2020] azithromycin (ZITHROMAX) tablet 250 mg  250 mg Oral Daily Barb Merino, MD      . baclofen (LIORESAL) tablet 10 mg  10 mg Oral Daily Manuella Ghazi, Pratik D, DO   10 mg at 09/25/20 0906  . budesonide (PULMICORT) nebulizer solution 0.25 mg  0.25 mg Nebulization BID Manuella Ghazi, Pratik D, DO   0.25 mg at 09/25/20 1002  . busPIRone (BUSPAR) tablet 7.5 mg  7.5 mg Oral BID Manuella Ghazi, Pratik D, DO   7.5 mg at 09/25/20 0907  . cephALEXin (KEFLEX) capsule 500 mg  500 mg Oral Q8H Barb Merino, MD   500 mg at 09/25/20 1400  . cholecalciferol (VITAMIN D3) tablet 1,000 Units  1,000 Units Oral Daily Heath Lark D, DO   1,000 Units at 09/25/20 0907  . enoxaparin (LOVENOX) injection 40 mg  40 mg Subcutaneous Q24H Shah, Pratik D, DO   40 mg at 09/25/20 1400  . ferrous sulfate tablet 325 mg  325 mg Oral BID WC Manuella Ghazi, Pratik D, DO   325 mg at 09/25/20 0907  . fluticasone (FLONASE) 50 MCG/ACT nasal spray 1 spray  1 spray Each Nare Daily Manuella Ghazi, Pratik D, DO   1 spray at 09/25/20 0906  . guaiFENesin (MUCINEX) 12 hr tablet 600 mg  600 mg Oral BID Heath Lark D, DO   600 mg at 09/25/20 0907  . hydrALAZINE (APRESOLINE) injection 10 mg  10 mg Intravenous Q4H PRN Manuella Ghazi, Pratik D, DO      . ipratropium-albuterol (DUONEB) 0.5-2.5 (3) MG/3ML nebulizer solution 3 mL  3 mL Nebulization Q6H Shah, Pratik D, DO   3 mL at 09/25/20 1002  . loratadine (CLARITIN) tablet 10 mg  10 mg Oral Daily Manuella Ghazi, Pratik D, DO   10 mg at 09/25/20 0907  . melatonin tablet 3 mg  3 mg Oral QHS Shah, Pratik D, DO   3 mg at 09/24/20 2210  . methylPREDNISolone sodium succinate (SOLU-MEDROL) 40 mg/mL injection  40 mg  40 mg Intravenous Q12H Shah, Pratik D, DO   40 mg at 09/25/20 1400  . ondansetron (ZOFRAN) tablet 4 mg  4 mg Oral Q6H PRN Manuella Ghazi, Pratik D, DO       Or  . ondansetron (ZOFRAN) injection 4 mg  4 mg Intravenous Q6H PRN Manuella Ghazi, Pratik D, DO   4 mg at 09/25/20 6226  . polyvinyl alcohol (LIQUIFILM TEARS) 1.4 % ophthalmic solution 1 drop  1 drop Both Eyes PRN Manuella Ghazi, Pratik D, DO      . sertraline (ZOLOFT) tablet 50 mg  50 mg Oral Daily Manuella Ghazi, Pratik D, DO   50 mg at 09/25/20 0906  .  sodium chloride flush (NS) 0.9 % injection 3 mL  3 mL Intravenous Q12H Shah, Pratik D, DO   3 mL at 09/25/20 0910  . sodium chloride flush (NS) 0.9 % injection 3 mL  3 mL Intravenous PRN Manuella Ghazi, Pratik D, DO      . traMADol Veatrice Bourbon) tablet 50 mg  50 mg Oral Q8H PRN Manuella Ghazi, Pratik D, DO   50 mg at 09/24/20 2210  . zinc sulfate capsule 220 mg  220 mg Oral Daily Heath Lark D, DO   220 mg at 09/25/20 0907     Discharge Medications: Please see discharge summary for a list of discharge medications.  Medication Overview   Icon medication changes this visit         Your medications have changed  Icon medications to start taking   START taking:  cephALEXin(KEFLEX)   Melatonin  This replaces a similar medication. See the full medication list for instructions.  predniSONE(DELTASONE)   Icon medications to change how you take   CHANGE how you take:  Artificial Tears   ipratropium-albuterol(DUONEB)   Mucinex Maximum Strength   Icon medications to stop taking   STOP taking:  albuterol108 (90 Base) MCG/ACT inhaler(VENTOLIN HFA)   Melatonin10 MG Subl  Replaced by a similar medication  Review your updated medication list below.  Current Medication List-Note: Take only these medications. Stop taking all medications not included on this list. If you have any questions regarding medications not on this list, please follow up with your healthcare provider. Bring this list to your next  appointment.  Current Medication List-Note: Take only these medications. Stop taking all medications not included on this list. If you have any questions regarding medications not on this list, please follow up with your healthcare provider. Bring this list to your next appointment.  Current Medication List-Note: Take only these medications. Stop taking all medications not included on this list. If you have any questions regarding medications not on this list, please follow up with your healthcare provider. Bring this list to your next appointment.    Medication Details Next Dose Due Morning Afternoon Evening Bedtime As Needed   acetaminophen 325 MG tablet Commonly known as: TYLENOL Take 650 mg by mouth every 4 (four) hours as needed.          ALPRAZolam 0.5 MG tablet Commonly known as: XANAX Take 1 tablet by mouth 2 (two) times daily.          amLODipine 5 MG tablet Commonly known as: NORVASC TAKE 1 TABLET BY MOUTH ONCE A DAY.          Anoro Ellipta 62.5-25 MCG/INH Aepb Inhale 1 puff into the lungs daily. Generic drug: umeclidinium-vilanterol         Icon medications to change how you take   Artificial Tears 1 % ophthalmic solution Apply 1 drop to eye every 2 (two) hours as needed (dry eye). What changed:  0 medication strength 0 when to take this For: Irritation of the Eye Generic drug: carboxymethylcellulose          ascorbic acid 500 MG tablet Commonly known as: VITAMIN C Take 500 mg by mouth daily.          atorvastatin 40 MG tablet Commonly known as: LIPITOR Take 40 mg by mouth daily.          baclofen 10 MG tablet Commonly known as: LIORESAL Take 10 mg by mouth daily.          busPIRone 7.5  MG tablet Commonly known as: BUSPAR Take 1 tablet by mouth 2 (two) times a day.         Icon medications to start taking   cephALEXin 500 MG capsule Commonly known as: KEFLEX Take 1 capsule (500 mg total) by mouth every 8 (eight) hours for 3 days.          ferrous sulfate 325 (65 FE)  MG tablet Take 325 mg by mouth 2 (two) times daily with a meal.          fluticasone 50 MCG/ACT nasal spray Commonly known as: FLONASE Place 1 spray into both nostrils daily.         Icon medications to change how you take   ipratropium-albuterol 0.5-2.5 (3) MG/3ML Soln Commonly known as: DUONEB Take 3 mLs by nebulization every 6 (six) hours. What changed:  0 when to take this 0 reasons to take this          loratadine 10 MG tablet Commonly known as: CLARITIN Take 10 mg by mouth daily.         Icon medications to start taking   Melatonin 10 MG Tabs Take 10 mg by mouth at bedtime. Replaces: Melatonin 10 MG Subl         Icon medications to change how you take   Mucinex Maximum Strength 1200 MG Tb12 Take 1 tablet (1,200 mg total) by mouth 2 (two) times daily. What changed: how much to take Generic drug: Guaifenesin          ondansetron 4 MG tablet Commonly known as: ZOFRAN Take 4 mg by mouth every 6 (six) hours as needed for nausea or vomiting.         Icon medications to start taking   predniSONE 10 MG tablet Commonly known as: DELTASONE 4 tabs daily for 3 days 3 tabs daily for 3 days 2 tabs daily for 3 days 1 tab daily for 3 days          sertraline 50 MG tablet Commonly known as: ZOLOFT Take 50 mg by mouth daily.          traMADol 50 MG tablet Commonly known as: ULTRAM Take 50 mg by mouth as needed for moderate pain.          triamcinolone cream 0.5 % Commonly known as: KENALOG Apply 1 application topically 2 (two) times daily as needed (skin).          Vitamin D (Cholecalciferol) 10 MCG (400 UNIT) Tabs Take 1 tablet by mouth daily.          zinc gluconate 50 MG tablet Take 50 mg by mouth daily.             Address: [address], [place] [address]  Phone: [phone]      Relevant Imaging Results:  Relevant Lab Results:   Additional Information SSN: [id number]  Sherie Don, LCSW

## 2020-09-25 NOTE — Care Management Important Message (Signed)
Important Message  Patient Details  Name: Lindsey Combs MRN: [id number] Date of Birth: 08-06-1946   Medicare Important Message Given:  Yes     Tommy Medal 09/25/2020, 1:12 PM

## 2020-09-25 NOTE — TOC Progression Note (Signed)
Transition of Care [facility]) - Progression Note   Patient Details  Name: Lindsey Combs MRN: [id number] Date of Birth: November 22, 1945  Transition of Care [facility]) CM/SW [place], LCSW Phone Number: 09/25/2020, 2:31 PM  Clinical Narrative: CSW spoke with Tammy as [facility] to confirm patient can return tomorrow. Tammy stated she did not want the patient to return on a holiday and would need to wait until Monday (09/29/19). CSW explained this would not be appropriate once the patient is medically ready. Tammy asked if the patient could be sent to SNF for a couple weeks. CSW explained that PT recommended HHPT and to fax her out for SNF would not be consistent with PT's recommendations.  CSW completed FL2 and faxed it to [facility] for Tammy to review. CSW received multiple calls to "correct" medications based on how the facility wants the medications to be listed. Medications were adjusted and a new FL2 was faxed to [facility]. Tammy stated she would need the new medications sent to the pharmacy. CSW informed Tammy the physician already sent them to the patient's pharmacy and encouraged her to call the pharmacy to make sure these are being filled. TOC to follow.  Expected Discharge Plan: Assisted Living Barriers to Discharge: Continued Medical Work up  Expected Discharge Plan and Services Expected Discharge Plan: Assisted Living In-house Referral: Clinical Social Work Discharge Planning Services: NA Post Acute Care Choice: NA Living arrangements for the past 2 months: [place]              DME Arranged: N/A DME Agency: NA HH Arranged: PT [place] Agency: [facility] Date [place]: 09/24/20 Time [place]: 2 Representative spoke with at [place]: Glennis Brink  Readmission Risk Interventions No flowsheet data found.

## 2020-09-25 NOTE — Progress Notes (Signed)
Physical Therapy Treatment Patient Details Name: ANNALIYAH WILLIG MRN: [id number] DOB: [birth year] Today's Date: 09/25/2020    History of Present Illness ABAGALE BOULOS is a 74 y.o. female with medical history significant for COPD with chronic hypoxemia on 4 L nasal cannula oxygen, anemia of inflammation, distant history of uvular cancer, pulmonary nodule with suspected lung cancer, hypertension, dyslipidemia, GERD, and anxiety who presented to the ED with worsening shortness of breath and wheezing over the past several days.  She has been using her home inhaler medications without significant relief.  She also reports feeling somewhat febrile.  She also is noted to have a cough that is sometimes productive of whitish sputum and has had Covid twice, but does get checked weekly and her last negative test was 5 days ago.  She denies any chest pain.  She is also noticing a left-sided headache for the past month or so with persistent nausea, but no emesis and has had an approximately 2 pound weight loss.    PT Comments    Patient seated in chair upon entrance for session. Patient agreeable to perform seated exercises but did not wish to ambulate as she stated she just ambulated to the bathroom. Patient requires min/no verbal cueing for exercise mechanics and she is educated on continuing to perform while in hospital for mobility/circulation. O2 sat monitored throughout session at >94% with seated exercises despite intermittent SOB. Patient will benefit from continued physical therapy in hospital and recommended venue below to increase strength, balance, endurance for safe ADLs and gait.   Follow Up Recommendations  Home health PT     Equipment Recommendations  None recommended by PT (pt has requisite DME at ALF)    Recommendations for Other Services       Precautions / Restrictions Precautions Precautions: Fall Restrictions Weight Bearing Restrictions: No    Mobility  Bed Mobility                General bed mobility comments: Patient seated in chair at beginning of session  Transfers                    Ambulation/Gait                 Stairs             Wheelchair Mobility    Modified Rankin (Stroke Patients Only)       Balance Overall balance assessment: Needs assistance Sitting-balance support: No upper extremity supported Sitting balance-Leahy Scale: Normal Sitting balance - Comments: seated in chair                                    Cognition Arousal/Alertness: Awake/alert Behavior During Therapy: WFL for tasks assessed/performed Overall Cognitive Status: Within Functional Limits for tasks assessed                                        Exercises General Exercises - Lower Extremity Ankle Circles/Pumps: AROM;Both;10 reps;Seated Long Arc Quad: AROM;Both;10 reps;Seated Hip Flexion/Marching: AROM;Both;20 reps;Seated    General Comments        Pertinent Vitals/Pain Pain Assessment: No/denies pain    Home Living                      Prior Function  PT Goals (current goals can now be found in the care plan section) Acute Rehab PT Goals Patient Stated Goal: "Get my strength back" PT Goal Formulation: With patient Time For Goal Achievement: 09/26/20 Potential to Achieve Goals: Good Progress towards PT goals: Progressing toward goals    Frequency    Min 3X/week      PT Plan Current plan remains appropriate    Co-evaluation              AM-PAC PT "6 Clicks" Mobility   Outcome Measure  Help needed turning from your back to your side while in a flat bed without using bedrails?: None Help needed moving from lying on your back to sitting on the side of a flat bed without using bedrails?: None Help needed moving to and from a bed to a chair (including a wheelchair)?: A Little Help needed standing up from a chair using your arms (e.g., wheelchair or  bedside chair)?: A Little Help needed to walk in hospital room?: A Little Help needed climbing 3-5 steps with a railing? : A Lot 6 Click Score: 19    End of Session   Activity Tolerance: Patient limited by fatigue;Patient tolerated treatment well Patient left: in bed;with call bell/phone within reach;with bed alarm set Nurse Communication: Mobility status PT Visit Diagnosis: Unsteadiness on feet (R26.81);Muscle weakness (generalized) (M62.81)     Time: 1950-9326 PT Time Calculation (min) (ACUTE ONLY): 15 min  Charges:  $Therapeutic Exercise: 8-22 mins                     12:40 PM, 09/25/20 Mearl Latin PT, DPT Physical Therapist at [facility]

## 2020-09-26 ENCOUNTER — Inpatient Hospital Stay (HOSPITAL_COMMUNITY): Payer: Medicare Other

## 2020-09-26 DIAGNOSIS — J441 Chronic obstructive pulmonary disease with (acute) exacerbation: Secondary | ICD-10-CM | POA: Diagnosis not present

## 2020-09-26 NOTE — Progress Notes (Signed)
PROGRESS NOTE    Lindsey Combs  JIR:[id number] DOB: 1946-04-24 DOA: 09/22/2020 PCP: [name], MD    Brief Narrative:  75 year old female with history of COPD and chronic hypoxemia on 4 L nasal cannula oxygen, chronic anemia, hypertension, hyperlipidemia, GERD, lung tumor under surveillance and anxiety presented to ER with worsening shortness of breath and wheezing.  Recently treated with oral prednisone therapy 2 weeks ago and recurrent symptoms.  In the emergency room hemodynamically stable.  CT head with no acute findings.  Chest x-ray with no acute findings.  COVID-19 and influenza test negative.  Admitted to hospital with COPD exacerbation. She has persistent wheezing and shortness of breath so remains in the hospital.   Assessment & Plan:   Active Problems:   COPD with acute exacerbation ([place])  COPD with acute exacerbation: Patient continues to symptomatic with wheezing and shortness of breath. Continue with bronchodilator therapy, IV steroids, inhalational steroids, scheduled and as needed bronchodilators, deep breathing exercises, incentive spirometry, chest physiotherapy and respiratory therapy consult. Keflex and azithromycin for 5 days.   Supplemental oxygen to keep saturations more than 92%. Continue to work with PT OT.  Chronic headache: CT scan negative for acute abnormalities.  Hyperlipidemia: On a statin, continue.  Hypertension: Blood pressure stable on amlodipine that is continued.  Pulmonary nodules/lung cancer: Recent PET scan with worsening hilar mass, with persistent shortness of breath, will repeat CT scan today.  May have progression of lung cancer.  If progression, will consult palliative.   DVT prophylaxis: enoxaparin (LOVENOX) injection 40 mg Start: 09/22/20 1530   Code Status: DNR/DNI Family Communication: Called and updated patient's son 12/30. Disposition Plan: Status is: Inpatient  Remains inpatient appropriate because:Inpatient level of  care appropriate due to severity of illness   Dispo: The patient is from: ALF              Anticipated d/c is to: ALF              Anticipated d/c date is: 2 days              Patient currently is not medically stable to d/c.      Consultants:   None  Procedures:   None  Antimicrobials:   Azithromycin, 12/29---  Rocephin, 12/30--12/31   Subjective: Patient seen and examined.  Tightness of chest, persistent bronchospasm and wheezing.  She is worried whether her lung cancer is spreading.  Objective: Vitals:   09/25/20 2037 09/26/20 0557 09/26/20 0852 09/26/20 1023  BP: (!) 176/80 (!) 163/79    Pulse: 99 87    Resp: 16 20    Temp: 98.9 F (37.2 C) 98.9 F (37.2 C)    TempSrc: Oral Oral    SpO2: 97% 98% 97% 96%  Weight:      Height:        Intake/Output Summary (Last 24 hours) at 09/26/2020 1315 Last data filed at 09/26/2020 1213 Gross per 24 hour  Intake --  Output 2050 ml  Net -2050 ml   Filed Weights   09/22/20 0935  Weight: 65.3 kg    Examination:  General exam: Chronically sick looking.  Audibly wheezing.  On 3 L oxygen. SpO2: 96 % O2 Flow Rate (L/min): 3 L/min  Respiratory system: Poor air entry.  Inspiratory and expiratory wheezing.  No crackles. Cardiovascular system: S1 & S2 heard, RRR. No JVD, murmurs, rubs, gallops or clicks. No pedal edema. Gastrointestinal system: Abdomen is nondistended, soft and nontender. No organomegaly or masses felt. Normal  bowel sounds heard. Central nervous system: Alert and oriented. No focal neurological deficits. Extremities: Symmetric 5 x 5 power. Skin: No rashes, lesions or ulcers Psychiatry: Judgement and insight appear normal. Mood & affect appropriate.     Data Reviewed: I have personally reviewed following labs and imaging studies  CBC: Recent Labs  Lab 09/22/20 1014 09/23/20 0553 09/24/20 0425 09/25/20 0437  WBC 12.1* 19.6* 23.6* 15.6*  NEUTROABS 9.7*  --  21.5* 13.7*  HGB 10.8* 11.3* 10.3*  10.3*  HCT 35.7* 36.0 33.3* 33.5*  MCV 95.5 93.3 93.8 94.4  PLT 256 305 262 062   Basic Metabolic Panel: Recent Labs  Lab 09/22/20 1014 09/23/20 0553  NA 138 138  K 4.7 4.1  CL 98 98  CO2 31 27  GLUCOSE 121* 165*  BUN 6* 10  CREATININE 0.59 0.67  CALCIUM 9.5 10.0  MG  --  2.1   GFR: Estimated Creatinine Clearance: 55 mL/min (by C-G formula based on SCr of 0.67 mg/dL). Liver Function Tests: Recent Labs  Lab 09/22/20 1014  AST 15  ALT 14  ALKPHOS 83  BILITOT 0.4  PROT 6.6  ALBUMIN 3.8   Recent Labs  Lab 09/22/20 1014  LIPASE 24   No results for input(s): AMMONIA in the last 168 hours. Coagulation Profile: No results for input(s): INR, PROTIME in the last 168 hours. Cardiac Enzymes: No results for input(s): CKTOTAL, CKMB, CKMBINDEX, TROPONINI in the last 168 hours. BNP (last 3 results) No results for input(s): PROBNP in the last 8760 hours. HbA1C: No results for input(s): HGBA1C in the last 72 hours. CBG: No results for input(s): GLUCAP in the last 168 hours. Lipid Profile: No results for input(s): CHOL, HDL, LDLCALC, TRIG, CHOLHDL, LDLDIRECT in the last 72 hours. Thyroid Function Tests: No results for input(s): TSH, T4TOTAL, FREET4, T3FREE, THYROIDAB in the last 72 hours. Anemia Panel: No results for input(s): VITAMINB12, FOLATE, FERRITIN, TIBC, IRON, RETICCTPCT in the last 72 hours. Sepsis Labs: Recent Labs  Lab 09/22/20 [place] <0.10    Recent Results (from the past 240 hour(s))  Resp Panel by RT-PCR (Flu A&B, Covid) Nasopharyngeal Swab     Status: None   Collection Time: 09/22/20  9:41 AM   Specimen: Nasopharyngeal Swab; Nasopharyngeal(NP) swabs in vial transport medium  Result Value Ref Range Status   SARS Coronavirus 2 by RT PCR NEGATIVE NEGATIVE Final    Comment: (NOTE) SARS-CoV-2 target nucleic acids are NOT DETECTED.  The SARS-CoV-2 RNA is generally detectable in upper respiratory specimens during the acute phase of infection.  The lowest concentration of SARS-CoV-2 viral copies this assay can detect is 138 copies/mL. A negative result does not preclude SARS-Cov-2 infection and should not be used as the sole basis for treatment or other patient management decisions. A negative result may occur with  improper specimen collection/handling, submission of specimen other than nasopharyngeal swab, presence of viral mutation(s) within the areas targeted by this assay, and inadequate number of viral copies(<138 copies/mL). A negative result must be combined with clinical observations, patient history, and epidemiological information. The expected result is Negative.  Fact Sheet for Patients:  EntrepreneurPulse.com.au  Fact Sheet for Healthcare Providers:  IncredibleEmployment.be  This test is no t yet approved or cleared by the [place] FDA and  has been authorized for detection and/or diagnosis of SARS-CoV-2 by FDA under an Emergency Use Authorization (EUA). This EUA will remain  in effect (meaning this test can be used) for the duration of the COVID-19 declaration  under Section 564(b)(1) of the Act, 21 U.S.C.section 360bbb-3(b)(1), unless the authorization is terminated  or revoked sooner.       Influenza A by PCR NEGATIVE NEGATIVE Final   Influenza B by PCR NEGATIVE NEGATIVE Final    Comment: (NOTE) The Xpert Xpress SARS-CoV-2/FLU/RSV plus assay is intended as an aid in the diagnosis of influenza from Nasopharyngeal swab specimens and should not be used as a sole basis for treatment. Nasal washings and aspirates are unacceptable for Xpert Xpress SARS-CoV-2/FLU/RSV testing.  Fact Sheet for Patients: EntrepreneurPulse.com.au  Fact Sheet for Healthcare Providers: IncredibleEmployment.be  This test is not yet approved or cleared by the [place] FDA and has been authorized for detection and/or diagnosis of SARS-CoV-2 by FDA  under an Emergency Use Authorization (EUA). This EUA will remain in effect (meaning this test can be used) for the duration of the COVID-19 declaration under Section 564(b)(1) of the Act, 21 U.S.C. section 360bbb-3(b)(1), unless the authorization is terminated or revoked.  Performed at [facility], [address] [place], [place] [address]          Radiology Studies: No results found.      Scheduled Meds: . ALPRAZolam  0.5 mg Oral BID  . amLODipine  5 mg Oral Daily  . ascorbic acid  500 mg Oral Daily  . atorvastatin  40 mg Oral Daily  . azithromycin  250 mg Oral Daily  . baclofen  10 mg Oral Daily  . budesonide (PULMICORT) nebulizer solution  0.25 mg Nebulization BID  . busPIRone  7.5 mg Oral BID  . cephALEXin  500 mg Oral Q8H  . cholecalciferol  1,000 Units Oral Daily  . enoxaparin (LOVENOX) injection  40 mg Subcutaneous Q24H  . ferrous sulfate  325 mg Oral BID WC  . fluticasone  1 spray Each Nare Daily  . guaiFENesin  600 mg Oral BID  . ipratropium-albuterol  3 mL Nebulization Q6H  . loratadine  10 mg Oral Daily  . melatonin  3 mg Oral QHS  . methylPREDNISolone (SOLU-MEDROL) injection  40 mg Intravenous Q12H  . sertraline  50 mg Oral Daily  . sodium chloride flush  3 mL Intravenous Q12H  . zinc sulfate  220 mg Oral Daily   Continuous Infusions: . sodium chloride       LOS: 4 days    Time spent: 30 minutes    Barb Merino, MD [facility] Pager [phone]

## 2020-09-27 ENCOUNTER — Inpatient Hospital Stay (HOSPITAL_COMMUNITY): Payer: Medicare Other

## 2020-09-27 DIAGNOSIS — C3490 Malignant neoplasm of unspecified part of unspecified bronchus or lung: Secondary | ICD-10-CM | POA: Diagnosis present

## 2020-09-27 DIAGNOSIS — C3492 Malignant neoplasm of unspecified part of left bronchus or lung: Secondary | ICD-10-CM

## 2020-09-27 DIAGNOSIS — I4891 Unspecified atrial fibrillation: Secondary | ICD-10-CM

## 2020-09-27 DIAGNOSIS — F419 Anxiety disorder, unspecified: Secondary | ICD-10-CM

## 2020-09-27 DIAGNOSIS — Z515 Encounter for palliative care: Secondary | ICD-10-CM

## 2020-09-27 DIAGNOSIS — J441 Chronic obstructive pulmonary disease with (acute) exacerbation: Secondary | ICD-10-CM | POA: Diagnosis not present

## 2020-09-27 DIAGNOSIS — C052 Malignant neoplasm of uvula: Secondary | ICD-10-CM | POA: Diagnosis present

## 2020-09-27 DIAGNOSIS — J9611 Chronic respiratory failure with hypoxia: Secondary | ICD-10-CM

## 2020-09-27 LAB — CBC WITH DIFFERENTIAL/PLATELET
Abs Immature Granulocytes: 0.3 10*3/uL — ABNORMAL HIGH (ref 0.00–0.07)
Basophils Absolute: 0 10*3/uL (ref 0.0–0.1)
Basophils Relative: 0 %
Eosinophils Absolute: 0 10*3/uL (ref 0.0–0.5)
Eosinophils Relative: 0 %
HCT: 36.6 % (ref 36.0–46.0)
Hemoglobin: 11.2 g/dL — ABNORMAL LOW (ref 12.0–15.0)
Immature Granulocytes: 2 %
Lymphocytes Relative: 9 %
Lymphs Abs: 1.4 10*3/uL (ref 0.7–4.0)
MCH: 28.6 pg (ref 26.0–34.0)
MCHC: 30.6 g/dL (ref 30.0–36.0)
MCV: 93.4 fL (ref 80.0–100.0)
Monocytes Absolute: 0.4 10*3/uL (ref 0.1–1.0)
Monocytes Relative: 3 %
Neutro Abs: 12.7 10*3/uL — ABNORMAL HIGH (ref 1.7–7.7)
Neutrophils Relative %: 86 %
Platelets: 309 10*3/uL (ref 150–400)
RBC: 3.92 MIL/uL (ref 3.87–5.11)
RDW: 13.3 % (ref 11.5–15.5)
WBC: 14.9 10*3/uL — ABNORMAL HIGH (ref 4.0–10.5)
nRBC: 0 % (ref 0.0–0.2)

## 2020-09-27 LAB — BLOOD GAS, ARTERIAL
Acid-Base Excess: 6.9 mmol/L — ABNORMAL HIGH (ref 0.0–2.0)
Bicarbonate: 29.8 mmol/L — ABNORMAL HIGH (ref 20.0–28.0)
FIO2: 40
O2 Saturation: 75.2 %
Patient temperature: 36.9
pCO2 arterial: 46.2 mmHg (ref 32.0–48.0)
pH, Arterial: 7.443 (ref 7.350–7.450)
pO2, Arterial: 39.5 mmHg — CL (ref 83.0–108.0)

## 2020-09-27 MED ORDER — TRAMADOL HCL 50 MG PO TABS: 50.0000 mg | ORAL_TABLET | ORAL | 0 refills | Status: DC | PRN

## 2020-09-27 MED ORDER — CHLORHEXIDINE GLUCONATE CLOTH 2 % EX PADS
6.0000 | MEDICATED_PAD | Freq: Every day | CUTANEOUS | Status: DC
  Administered 2020-09-27: 6 via TOPICAL

## 2020-09-27 MED ORDER — GLYCOPYRROLATE 0.2 MG/ML IJ SOLN: 0.2000 mg | INTRAMUSCULAR | Status: DC | PRN

## 2020-09-27 MED ORDER — GLYCOPYRROLATE 1 MG PO TABS: 1.0000 mg | ORAL_TABLET | ORAL | Status: DC | PRN

## 2020-09-27 MED ORDER — DILTIAZEM HCL-DEXTROSE 125-5 MG/125ML-% IV SOLN (PREMIX)
5.0000 mg/h | INTRAVENOUS | Status: DC
  Filled 2020-09-27: qty 125

## 2020-09-27 MED ORDER — LORAZEPAM 1 MG PO TABS: 1.0000 mg | ORAL_TABLET | ORAL | Status: DC | PRN

## 2020-09-27 MED ORDER — DILTIAZEM HCL 25 MG/5ML IV SOLN
10.0000 mg | Freq: Once | INTRAVENOUS | Status: AC
  Administered 2020-09-27: 10 mg via INTRAVENOUS
  Filled 2020-09-27: qty 5

## 2020-09-27 MED ORDER — DIPHENHYDRAMINE HCL 50 MG/ML IJ SOLN: 12.5000 mg | INTRAMUSCULAR | Status: DC | PRN

## 2020-09-27 MED ORDER — LORAZEPAM 2 MG/ML PO CONC: 1.0000 mg | ORAL | Status: DC | PRN

## 2020-09-27 MED ORDER — AMLODIPINE BESYLATE 10 MG PO TABS: 10.0000 mg | ORAL_TABLET | Freq: Every day | ORAL | 2 refills | Status: DC

## 2020-09-27 MED ORDER — IPRATROPIUM-ALBUTEROL 0.5-2.5 (3) MG/3ML IN SOLN: 3.0000 mL | Freq: Three times a day (TID) | RESPIRATORY_TRACT | 1 refills | Status: DC

## 2020-09-27 MED ORDER — BIOTENE DRY MOUTH MT LIQD: 15.0000 mL | OROMUCOSAL | Status: DC | PRN

## 2020-09-27 MED ORDER — AMOXICILLIN-POT CLAVULANATE 875-125 MG PO TABS: 1.0000 | ORAL_TABLET | Freq: Two times a day (BID) | ORAL | 0 refills | Status: DC

## 2020-09-27 MED ORDER — SODIUM CHLORIDE 0.9% FLUSH
3.0000 mL | Freq: Two times a day (BID) | INTRAVENOUS | Status: DC
  Administered 2020-09-27 – 2020-09-28 (×2): 3 mL via INTRAVENOUS

## 2020-09-27 MED ORDER — SODIUM CHLORIDE 0.9 % IV SOLN: 250.0000 mL | INTRAVENOUS | Status: DC | PRN

## 2020-09-27 MED ORDER — ACETAMINOPHEN 325 MG PO TABS: 650.0000 mg | ORAL_TABLET | Freq: Four times a day (QID) | ORAL | Status: DC | PRN

## 2020-09-27 MED ORDER — DEXAMETHASONE 6 MG PO TABS: 6.0000 mg | ORAL_TABLET | Freq: Every day | ORAL | 0 refills | Status: DC

## 2020-09-27 MED ORDER — MORPHINE SULFATE (PF) 2 MG/ML IV SOLN
2.0000 mg | INTRAVENOUS | Status: DC | PRN
  Administered 2020-09-27: 2 mg via INTRAVENOUS
  Filled 2020-09-27: qty 1

## 2020-09-27 MED ORDER — PHENOL 1.4 % MT LIQD
1.0000 | OROMUCOSAL | Status: DC | PRN
  Filled 2020-09-27: qty 177

## 2020-09-27 MED ORDER — LORAZEPAM 2 MG/ML IJ SOLN
1.0000 mg | INTRAMUSCULAR | Status: DC | PRN
  Administered 2020-09-27 – 2020-09-28 (×3): 1 mg via INTRAVENOUS
  Filled 2020-09-27 (×3): qty 1

## 2020-09-27 MED ORDER — OLANZAPINE 5 MG PO TBDP
5.0000 mg | ORAL_TABLET | Freq: Every day | ORAL | Status: DC
  Filled 2020-09-27 (×2): qty 1

## 2020-09-27 MED ORDER — SODIUM CHLORIDE 0.9% FLUSH: 3.0000 mL | INTRAVENOUS | Status: DC | PRN

## 2020-09-27 MED ORDER — ACETAMINOPHEN 650 MG RE SUPP: 650.0000 mg | Freq: Four times a day (QID) | RECTAL | Status: DC | PRN

## 2020-09-27 MED ORDER — SODIUM CHLORIDE 0.9 % IV SOLN
3.0000 g | Freq: Four times a day (QID) | INTRAVENOUS | Status: DC
  Filled 2020-09-27 (×4): qty 8

## 2020-09-27 MED ORDER — MORPHINE SULFATE (PF) 2 MG/ML IV SOLN
2.0000 mg | INTRAVENOUS | Status: DC | PRN
  Administered 2020-09-27 – 2020-09-28 (×12): 2 mg via INTRAVENOUS
  Filled 2020-09-27 (×12): qty 1

## 2020-09-27 MED ORDER — POLYVINYL ALCOHOL 1.4 % OP SOLN: 1.0000 [drp] | Freq: Four times a day (QID) | OPHTHALMIC | Status: DC | PRN

## 2020-09-27 MED ORDER — BISACODYL 10 MG RE SUPP: 10.0000 mg | Freq: Every day | RECTAL | Status: DC | PRN

## 2020-09-27 MED ORDER — ALBUTEROL SULFATE HFA 108 (90 BASE) MCG/ACT IN AERS: 2.0000 | INHALATION_SPRAY | RESPIRATORY_TRACT | 0 refills | Status: AC | PRN

## 2020-09-27 NOTE — Progress Notes (Signed)
Pt had an order to be bladder scanned to check for urinary retention. Pt denied and stated she didn't want that done as it would be too uncomfortable for her. There is minimal urine, maybe 75 cc in the canister from her purewick and she states that every time she coughs, some comes out. Will continue to monitor and assess urine output and patient comfort.

## 2020-09-27 NOTE — Progress Notes (Signed)
- --  Patient was moved to stepdown unit earlier due to A. fib with RVR to allow for IV Cardizem administration  Plan of care, advanced directives and goals of care discussed with patient and patient's son -----they have declined IV Cardizem, they have declined further interventions -They are requesting transition to full comfort care/hospice - Patient desires/wishes verified with patient and son -Patient is DNR/DNI -Initiate comfort cares palliative care/ hospice pathway order sets  Roxan Hockey, MD

## 2020-09-27 NOTE — Progress Notes (Signed)
CRITICAL VALUE ALERT  Critical Value:  PO2 39.5  Date & Time Notied:  09/27/2020 1520  Provider Notified: Dr. Joesph Fillers   Orders Received/Actions taken: no new orders at this time

## 2020-09-27 NOTE — Progress Notes (Signed)
Patient Demographics:    Lindsey Combs, is a 75 y.o. female, DOB - 09/17/1946, MWN:[id number]  Admit date - 09/22/2020   Admitting Physician Pratik Darleen Crocker, DO  Outpatient Primary MD for the patient is [name], MD  LOS - 5   Chief Complaint  Patient presents with  . Shortness of Breath        Subjective:    Lindsey Combs today has no fevers, no emesis,  No chest pain,   -Worsening shortness of breath, worsening hypoxia requiring nonrebreather bag -Now in A. 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 Recently treated with oral prednisone therapy 2 weeks ago and recurrent symptoms. In the emergency room hemodynamically stable. CT head with no acute findings. Chest x-ray with no acute findings. COVID-19 and influenza test negative. Admitted to hospital with COPD exacerbation. -CT chest suggestive of enlarging left hilar mass with compression of the left lower bronchus--- --09/27/20---patient becoming more hypoxic, patient requesting de-escalation of care   A/p 1)COPD with acute exacerbation: -c/n  IV Solu-Medrol,  -IV Unasyn for presumed postobstructive pneumonia, mucolytics and bronchodilators -Currently requiring nonrebreather bag --CT chest from  09/26/2020 shows Enlarging left hilar mass with compression or infiltration of the left lower lobe bronchus with associated postobstructive changes AND  Emphysematous changes throughout the lungs, as well as bony metastases -Recent PET scan with worsening hilar mass -History of distant uvular cancer -We will get pulmonology consult -We need oncology input -Palliative care consult for goals of care requested ---WBC is trending down  2)Chronic headache: CT scan negative for acute abnormalities.  3)Hyperlipidemia: On a statin, continue.  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 Lilli Light [facility]. Pathology shows margins negative, p16 was positive. -Please see # 1  8)Anemia of chronic inflammation: -Hgb is 11.2  -Colonoscopy and EGD were on 03/01/2013.  9) atrial fibrillation with RVR with heart rate in the 150s and 160s requiring IV Cardizem for rate control   Disposition/Need for in-Hospital Stay- patient unable to be discharged at this time due to -worsening hypoxic respiratory failure and A. fib with RVR requiring further interventions including nonrebreather bag and IV Cardizem*  Status is: Inpatient  Remains inpatient appropriate because:Please see above   Disposition: The patient is from: ALF  Anticipated d/c is to: ALF with hospice versus SNF              Anticipated d/c date is: > 3 days              Patient currently is not medically stable to d/c. Barriers: Not Clinically Stable-   Code Status : -  Code Status: DNR   Family Communication:  Called and updated patient's sonTodd  Bonnes on 09/27/2020----  Consults  :  PCCM/Palliative/  DVT Prophylaxis  :   - SCDs  enoxaparin (LOVENOX) injection 40 mg Start: 09/22/20 1530    Lab Results  Component Value Date   PLT 309 09/27/2020    Inpatient Medications  Scheduled Meds: . ALPRAZolam  0.5 mg Oral BID  . ascorbic acid  500 mg Oral Daily  . atorvastatin  40 mg Oral Daily  . baclofen  10 mg Oral Daily  . budesonide (PULMICORT) nebulizer solution  0.25 mg Nebulization BID  . busPIRone  7.5 mg Oral BID  . cephALEXin  500 mg Oral Q8H  . cholecalciferol  1,000 Units Oral Daily  . enoxaparin (LOVENOX) injection  40 mg Subcutaneous Q24H  . ferrous sulfate  325 mg Oral BID WC  . fluticasone  1 spray Each Nare Daily  . guaiFENesin  600 mg Oral BID  . ipratropium-albuterol  3 mL Nebulization Q6H  . loratadine  10 mg Oral Daily  . melatonin  3 mg Oral QHS  . methylPREDNISolone (SOLU-MEDROL) injection  40 mg Intravenous Q12H  . sertraline  50 mg Oral Daily  . sodium chloride flush  3 mL Intravenous Q12H  . zinc sulfate  220 mg Oral Daily   Continuous Infusions: . sodium chloride    . diltiazem (CARDIZEM) infusion     PRN Meds:.sodium chloride, acetaminophen, albuterol, hydrALAZINE, morphine injection, ondansetron **OR** ondansetron (ZOFRAN) IV, phenol, polyvinyl alcohol, sodium chloride flush, traMADol    Anti-infectives (From admission, onward)   Start     Dose/Rate Route Frequency Ordered Stop   09/27/20 0000  amoxicillin-clavulanate (AUGMENTIN) 875-125 MG tablet        1 tablet Oral 2 times daily 09/27/20 1411 10/02/20 2359   09/26/20 1000  azithromycin (ZITHROMAX) tablet 250 mg        250 mg Oral Daily 09/25/20 1145 09/27/20 0903   09/25/20 1400  cephALEXin (KEFLEX) capsule 500 mg        500 mg Oral Every 8 hours 09/25/20 1145     09/25/20 0000  cephALEXin (KEFLEX) 500 MG capsule        500 mg Oral Every 8 hours 09/25/20 1434 09/28/20 2359   09/24/20 0915  cefTRIAXone (ROCEPHIN) 1 g in sodium chloride  0.9 % 100 mL IVPB  Status:  Discontinued        1 g 200 mL/hr over 30 Minutes Intravenous Every 24 hours 09/24/20 0815 09/25/20 1145   09/23/20 1000  azithromycin (ZITHROMAX) tablet 500 mg  Status:  Discontinued        500 mg Oral Daily 09/23/20 0842 09/25/20 1145        Objective:   Vitals:   09/27/20 0735 09/27/20 0904 09/27/20 1442 09/27/20 1451  BP:  (!) 151/57 (!) 192/86 140/88  Pulse:  94 (!) 167   Resp:  18 18 18   Temp:   98.4 F (36.9 C) 98.4 F (36.9 C)  TempSrc:    Oral  SpO2: 94% 90%  (!) 78%  Weight:      Height:  Wt Readings from Last 3 Encounters:  09/22/20 65.3 kg  09/07/20 66.2 kg  07/27/20 66.6 kg     Intake/Output Summary (Last 24 hours) at 09/27/2020 1543 Last data filed at 09/26/2020 1827 Gross per 24 hour  Intake --  Output 600 ml  Net -600 ml    Physical Exam  Gen:- Awake Alert, chronically ill-appearing, no acute respiratory distress HEENT:- [place].AT, No sclera icterus Neck-Supple Neck,No JVD,.  Lungs-significantly diminished breath sounds especially on the left  CV- S1, S2 normal, irregularly irregular and tachycardic abd-  +ve B.Sounds, Abd Soft, No tenderness,    Extremity/Skin:- No  edema, pedal pulses present  Psych-affect is appropriate, oriented x3 Neuro-generalized weakness, no new focal deficits, no tremors   Data Review:   Micro Results Recent Results (from the past 240 hour(s))  Resp Panel by RT-PCR (Flu A&B, Covid) Nasopharyngeal Swab     Status: None   Collection Time: 09/22/20  9:41 AM   Specimen: Nasopharyngeal Swab; Nasopharyngeal(NP) swabs in vial transport medium  Result Value Ref Range Status   SARS Coronavirus 2 by RT PCR NEGATIVE NEGATIVE Final    Comment: (NOTE) SARS-CoV-2 target nucleic acids are NOT DETECTED.  The SARS-CoV-2 RNA is generally detectable in upper respiratory specimens during the acute phase of infection. The lowest concentration of SARS-CoV-2 viral copies this assay can detect is 138  copies/mL. A negative result does not preclude SARS-Cov-2 infection and should not be used as the sole basis for treatment or other patient management decisions. A negative result may occur with  improper specimen collection/handling, submission of specimen other than nasopharyngeal swab, presence of viral mutation(s) within the areas targeted by this assay, and inadequate number of viral copies(<138 copies/mL). A negative result must be combined with clinical observations, patient history, and epidemiological information. The expected result is Negative.  Fact Sheet for Patients:  EntrepreneurPulse.com.au  Fact Sheet for Healthcare Providers:  IncredibleEmployment.be  This test is no t yet approved or cleared by the [place] FDA and  has been authorized for detection and/or diagnosis of SARS-CoV-2 by FDA under an Emergency Use Authorization (EUA). This EUA will remain  in effect (meaning this test can be used) for the duration of the COVID-19 declaration under Section 564(b)(1) of the Act, 21 U.S.C.section 360bbb-3(b)(1), unless the authorization is terminated  or revoked sooner.       Influenza A by PCR NEGATIVE NEGATIVE Final   Influenza B by PCR NEGATIVE NEGATIVE Final    Comment: (NOTE) The Xpert Xpress SARS-CoV-2/FLU/RSV plus assay is intended as an aid in the diagnosis of influenza from Nasopharyngeal swab specimens and should not be used as a sole basis for treatment. Nasal washings and aspirates are unacceptable for Xpert Xpress SARS-CoV-2/FLU/RSV testing.  Fact Sheet for Patients: EntrepreneurPulse.com.au  Fact Sheet for Healthcare Providers: IncredibleEmployment.be  This test is not yet approved or cleared by the [place] FDA and has been authorized for detection and/or diagnosis of SARS-CoV-2 by FDA under an Emergency Use Authorization (EUA). This EUA will remain in effect (meaning  this test can be used) for the duration of the COVID-19 declaration under Section 564(b)(1) of the Act, 21 U.S.C. section 360bbb-3(b)(1), unless the authorization is terminated or revoked.  Performed at [facility], [address], [place], [place] [address]     Radiology Reports CT Head Wo Contrast  Result Date: 09/22/2020 CLINICAL DATA:  Headache, new or worsening (Age >= 50y) EXAM: CT HEAD WITHOUT CONTRAST TECHNIQUE: Contiguous axial images were obtained from the  base of the skull through the vertex without intravenous contrast. COMPARISON:  09/21/2016. FINDINGS: Brain: No acute infarct or intracranial hemorrhage. No mass lesion. No midline shift, ventriculomegaly or extra-axial fluid collection. Vascular: No hyperdense vessel or unexpected calcification. Bilateral skull base atherosclerotic calcifications. Skull: Negative for fracture or focal lesion. Sinuses/Orbits: Normal orbits. Clear paranasal sinuses. No mastoid effusion. Other: None. IMPRESSION: No acute intracranial process. Electronically Signed   By: Primitivo Gauze M.D.   On: 09/22/2020 12:04   CT CHEST WO CONTRAST  Result Date: 09/26/2020 CLINICAL DATA:  Non-small cell lung cancer.  Known lung nodule. EXAM: CT CHEST WITHOUT CONTRAST TECHNIQUE: Multidetector CT imaging of the chest was performed following the standard protocol without IV contrast. COMPARISON:  CT chest 08/16/2019.  PET-CT 07/07/2020 FINDINGS: Cardiovascular: Normal heart size. No pericardial effusions. Coronary artery calcifications. Normal caliber thoracic aorta with scattered calcification. Mediastinum/Nodes: Esophagus is decompressed. Mediastinal lymph nodes are not pathologically enlarged. Lungs/Pleura: Left hilar mass is difficult to define without contrast material but appears to measure about 3.4 x 3.9 cm in diameter. Measurement is enlarging since the previous study. Emphysematous changes throughout the lungs. Right upper lung nodule seen on prior PET-CT  scan is less distinct than previously. There is developing linear infiltration in the left lung base probably representing postobstructive change. The left lower lung bronchus appears to be compressed or infiltrated by the hilar mass. Nodule in the left lower lung posteriorly measures 4 mm diameter. No change since prior study. No pleural effusions. No pneumothorax. Upper Abdomen: Left adrenal gland nodule measuring 1.7 cm diameter. No change since prior study. Musculoskeletal: Compression deformities of the T5 and T6 vertebra with mild compression of C7. Progression of vertebral compression since previous study. These are likely metastatic but could also represent progressing osteoporosis. IMPRESSION: 1. Enlarging left hilar mass with compression or infiltration of the left lower lobe bronchus with associated postobstructive changes. 2. Emphysematous changes throughout the lungs. 3. Compression deformities of the T5 and T6 vertebra with mild compression of C7. Progression of vertebral compression since previous study. These are likely metastatic but could also represent progressing osteoporosis. 4. Left adrenal gland nodule measuring 1.7 cm diameter. No change since prior study. 5. Emphysema and aortic atherosclerosis. 6. Right upper lung nodule seen on prior PET-CT scan is less distinct than previously. Stable appearance of a 4 mm nodule in the left lung base. Aortic Atherosclerosis (ICD10-I70.0) and Emphysema (ICD10-J43.9). Electronically Signed   By: Lucienne Capers M.D.   On: 09/26/2020 16:59   DG CHEST PORT 1 VIEW  Result Date: 09/27/2020 CLINICAL DATA:  Worsening shortness of breath. History of non-small cell lung cancer. EXAM: PORTABLE CHEST 1 VIEW COMPARISON:  Chest x-ray 09/22/2020 and chest CT 09/26/2020 FINDINGS: The cardiac silhouette, mediastinal and hilar contours are stable. There is a persistent large left hilar mass. Interval worsening left lung aeration likely due to lower lobe bronchial  obstruction noted on the recent CT scan. The right lung remains clear. Stable underlying emphysematous changes. IMPRESSION: Left hilar mass. Worsening left lung aeration likely due to lower lobe bronchial obstruction noted on the recent CT scan. Electronically Signed   By: Marijo Sanes M.D.   On: 09/27/2020 15:22   DG Chest Port 1 View  Result Date: 09/22/2020 CLINICAL DATA:  Shortness of breath, wheezing. EXAM: PORTABLE CHEST 1 VIEW COMPARISON:  September 07, 2020. FINDINGS: The heart size and mediastinal contours are within normal limits. Both lungs are clear. No pneumothorax or pleural effusion is noted. The visualized skeletal  structures are unremarkable. IMPRESSION: No active disease. Electronically Signed   By: Marijo Conception M.D.   On: 09/22/2020 09:57   DG Chest Port 1 View  Result Date: 09/07/2020 CLINICAL DATA:  Cough and congestion. EXAM: PORTABLE CHEST 1 VIEW COMPARISON:  Chest x-ray 09/03/2019 FINDINGS: The cardiac silhouette, mediastinal and hilar contours are within normal limits and stable. Stable tortuosity and calcification of the thoracic aorta. Chronic emphysematous and bronchitic lung changes without definite acute overlying pulmonary process no pleural effusions or pneumothorax. The bony thorax is intact. IMPRESSION: Chronic lung changes without definite acute overlying pulmonary process. Electronically Signed   By: Marijo Sanes M.D.   On: 09/07/2020 12:09     CBC Recent Labs  Lab 09/22/20 1014 09/23/20 0553 09/24/20 0425 09/25/20 0437 09/27/20 0616  WBC 12.1* 19.6* 23.6* 15.6* 14.9*  HGB 10.8* 11.3* 10.3* 10.3* 11.2*  HCT 35.7* 36.0 33.3* 33.5* 36.6  PLT 256 305 262 299 309  MCV 95.5 93.3 93.8 94.4 93.4  MCH 28.9 29.3 29.0 29.0 28.6  MCHC 30.3 31.4 30.9 30.7 30.6  RDW 13.0 13.2 13.3 13.3 13.3  LYMPHSABS 1.5  --  1.2 1.3 1.4  MONOABS 0.6  --  0.7 0.4 0.4  EOSABS 0.1  --  0.0 0.0 0.0  BASOSABS 0.1  --  0.0 0.0 0.0    Chemistries  Recent Labs  Lab  09/22/20 1014 09/23/20 0553  NA 138 138  K 4.7 4.1  CL 98 98  CO2 31 27  GLUCOSE 121* 165*  BUN 6* 10  CREATININE 0.59 0.67  CALCIUM 9.5 10.0  MG  --  2.1  AST 15  --   ALT 14  --   ALKPHOS 83  --   BILITOT 0.4  --    ------------------------------------------------------------------------------------------------------------------ No results for input(s): CHOL, HDL, LDLCALC, TRIG, CHOLHDL, LDLDIRECT in the last 72 hours.  Lab Results  Component Value Date   HGBA1C 5.7 (H) 02/18/2017   ------------------------------------------------------------------------------------------------------------------ No results for input(s): TSH, T4TOTAL, T3FREE, THYROIDAB in the last 72 hours.  Invalid input(s): FREET3 ------------------------------------------------------------------------------------------------------------------ No results for input(s): VITAMINB12, FOLATE, FERRITIN, TIBC, IRON, RETICCTPCT in the last 72 hours.  Coagulation profile No results for input(s): INR, PROTIME in the last 168 hours.  No results for input(s): DDIMER in the last 72 hours.  Cardiac Enzymes No results for input(s): CKMB, TROPONINI, MYOGLOBIN in the last 168 hours.  Invalid input(s): CK ------------------------------------------------------------------------------------------------------------------ No results found for: BNP   Roxan Hockey M.D on 09/27/2020 at 3:43 PM  Go to [email or web address] - for contact info  [facility] - Office  [phone]

## 2020-09-27 NOTE — Progress Notes (Signed)
Pharmacy Antibiotic Note  LUCILE HILLMANN is a 75 y.o. female  lung cancer patient admitted on 09/22/2020 with shortness of breath.  Pharmacy has been consulted to dose Unasyn for possible aspiration pneumonia.  Plan: Start Unasyn 3g IV q6h  Pharmacy will continue to monitor renal function,   cultures and patient progress.   Height: 5' 5.5" (166.4 cm) Weight: 65.3 kg (144 lb) IBW/kg (Calculated) : 58.15  Temp (24hrs), Avg:98.5 F (36.9 C), Min:98.4 F (36.9 C), Max:98.6 F (37 C)  Recent Labs  Lab 09/22/20 1014 09/23/20 0553 09/24/20 0425 09/25/20 0437 09/27/20 0616  WBC 12.1* 19.6* 23.6* 15.6* 14.9*  CREATININE 0.59 0.67  --   --   --     Estimated Creatinine Clearance: 55 mL/min (by C-G formula based on SCr of 0.67 mg/dL).    Allergies  Allergen Reactions  . Prednisone Nausea Only  . Chantix [Varenicline] Other (See Comments)    Mental status changes  . Codeine Itching    Intake/Output Summary (Last 24 hours) at 09/27/2020 1601 Last data filed at 09/26/2020 1827 Gross per 24 hour  Intake --  Output 600 ml  Net -600 ml     Antimicrobials this admission: Unasyn 1/2 >>   azithromycin 1/1>>1/2 cephalexin 12/31>>1/2    Microbiology results:  12/28 Resp PCR: SARS CoV-2 negative; Flu A/B negative    Thank you for allowing pharmacy to be a part of this patient's care.  Despina Pole 09/27/2020 3:55 PM

## 2020-09-27 NOTE — Discharge Summary (Addendum)
Lindsey Combs, is a 75 y.o. female  DOB 1946-02-12  MRN [id number].  Admission date:  09/22/2020  Admitting Physician  [place], DO  Discharge Date:  09/27/2020   Primary MD  [name], MD  Recommendations for primary care physician for things to follow:   1)Follow - up with Dr. Kara Mead or Dr Christinia Gully Pulmonologist in [place]- due to Primary lung cancer with metastasis/Enlarging left hilar mass with compression or infiltration of the Lt Bronchus/Bony Mets -Address: [address] [place], [place] [address] Phone: [phone] OR call [phone] -Patient needs to be seen in the [place] office, Not in [place]  2)Please follow-up with oncologist Dr. Derek Jack, MD--due to Primary lung cancer with metastasis/Enlarging left hilar mass with compression or infiltration of the Lt Bronchus/Bony Mets -Address: inside [facility] (4th Floor), [place], [place], [place] [address] Phone: [phone]  3) please take medications as prescribed  4) it is important that you follow-up with pulmonologist as noted above #1  5)you need oxygen at home at 4 L via nasal cannula continuously while awake and while asleep--- smoking or having open fires around oxygen can cause fire, significant injury and death  Admission Diagnosis  COPD exacerbation ([place]) [J44.1] COPD with acute exacerbation ([place]) [J44.1] Chronic intractable headache, unspecified headache type [R51.9, G89.29]   Discharge Diagnosis  COPD exacerbation ([place]) [J44.1] COPD with acute exacerbation ([place]) [J44.1] Chronic intractable headache, unspecified headache type [R51.9, G89.29]    Active Problems:   COPD/Emphysema with acute exacerbation ([place])   Lung cancer--- Primary lung cancer with metastasis/Enlarging left hilar mass with compression or infiltration of the Lt Bronchus/Bony Mets   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 Acute respiratory failure with hypoxia ([place])   . Anemia   . Anxiety   . Cancer (HCC)    uvular per pt  . Cervicalgia   . COPD (chronic obstructive pulmonary disease) ([place])   . DDD (degenerative disc disease), lumbar   . Degenerative joint disease of spine   . Diabetes mellitus without complication ([place])   . Diverticulosis   . GERD (gastroesophageal reflux disease)   . H. pylori infection    treated 02/2013.  [name] History of UTI   . Hypercholesterolemia   . Hypertension   . Hyponatremia   . MDD (major depressive disorder)   . Peripheral neuropathy   . Shortness of breath    with exertion  . Spondylosis   . Thyroid disease   . Tobacco abuse     Past Surgical History:  Procedure Laterality Date  . ABDOMINAL EXPLORATION SURGERY     age 89, large ovarian cyst  . BIOPSY N/A 03/01/2013   BXU:XYBF hiatal hernia; otherwise, normal examination/ Status post biopsies as described above. SB bx negative for Celiac. +h/pylori  . CATARACT EXTRACTION W/ INTRAOCULAR LENS  IMPLANT, BILATERAL    .  COLONOSCOPY  10/16/09   Jenkins:3 polypsin the sigmoid colon/polyps in the descending colon and the rectum/scattered diverticulum. hyperplastic  . COLONOSCOPY WITH ESOPHAGOGASTRODUODENOSCOPY (EGD) N/A 03/01/2013   RJJ:OACZYSAY colonic polyps - treated/removed as described above. Colonic diverticulosis. Hyperplastic. Next TCS 02/2018.  Gastric biopsy showed H. pylori.  She completed treatment.  [name] DIRECT LARYNGOSCOPY N/A 06/08/2016   Procedure: DIRECT LARYNGOSCOPY AND BIOPSY;  Surgeon: Leta Baptist, MD;  Location: [facility] OR;  Service: ENT;  Laterality: N/A;  . PARTIAL HYSTERECTOMY     age 20  . UVULECTOMY       HPI  from the history and physical done on the day of admission:    Patient coming from: [employer]  Chief Complaint: Dyspnea  HPI: Lindsey Combs is a 75  y.o. female with medical history significant for COPD with chronic hypoxemia on 4 L nasal cannula oxygen, anemia of inflammation, distant history of uvular cancer, pulmonary nodule with suspected lung cancer, hypertension, dyslipidemia, GERD, and anxiety who presented to the ED with worsening shortness of breath and wheezing over the past several days.  She has been using her home inhaler medications without significant relief.  She also reports feeling somewhat febrile.  She also is noted to have a cough that is sometimes productive of whitish sputum and has had Covid twice, but does get checked weekly and her last negative test was 5 days ago.  She denies any chest pain.  She is also noticing a left-sided headache for the past month or so with persistent nausea, but no emesis and has had an approximately 2 pound weight loss.    ED Course: Vital signs stable and patient is afebrile.  Hemoglobin 10.8 and mild leukocytosis of 12,100 noted.  CT of the head with no acute findings and chest x-ray with no acute findings.  Covid testing negative.  EKG with sinus rhythm at 96 bpm.  Patient has received some steroids and nebulized breathing treatments in the ED.  Review of Systems: Reviewed as noted above, otherwise negative.     Hospital Course:     Brief Narrative:  75 year old female with history of COPD and chronic hypoxemia on 4 L nasal cannula oxygen, chronic anemia, hypertension, hyperlipidemia, GERD and anxiety presented to ER with worsening shortness of breath and wheezing.  Recently treated with oral prednisone therapy 2 weeks ago and recurrent symptoms.  In the emergency room hemodynamically stable.  CT head with no acute findings.  Chest x-ray with no acute findings.  COVID-19 and influenza test negative.  Admitted to hospital with COPD exacerbation.   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:   Chronic respiratory failure with hypoxia (HCC)   Anxiety  A/p  1)COPD with acute exacerbation: -Patient was treated with IV Solu-Medrol, azithromycin and Rocephin, mucolytics and bronchodilators -Improved overall currently requiring 4 L of oxygen via nasal cannula which is her baseline -d/c to AL[facility] at [facility] on Decadron and  --CT chest from 09/26/2020 shows Enlarging left hilar mass with compression or infiltration of the left lower lobe bronchus with associated postobstructive changes AND  Emphysematous changes throughout the lungs, as well as bony metastases - Recent PET scan with worsening hilar mass -History of distant uvular cancer -Patient strongly advised to follow-up pulmonologist as outpatient -Patient also  strongly advised to follow-up with oncologist as outpatient -Patient further advised to follow-up with palliative care as outpatient after talking to pulmonology and oncology to see what her options for work-up and treatment might be. ---WBC is trending down  2)Chronic headache: CT scan negative for acute abnormalities.  3)Hyperlipidemia: On a statin, continue.  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 Lilli Light [facility]. Pathology shows margins negative, p16 was positive. -Please see # 1  8)Anemia of chronic inflammation: -Hgb is 11.2  -Colonoscopy and EGD were on 03/01/2013.  Code Status: DNR/DNI Family Communication: Called and updated  patient's son Bernette Seeman on 09/27/2020----  Disposition Plan:   D/c to [facility]   Remains inpatient appropriate because:Inpatient level of care appropriate due to severity of illness   Dispo: The patient is from: ALF  Anticipated d/c is to: ALF  Discharge Condition: stable,   Follow UP   Contact information for follow-up providers    [facility] Follow up.   Specialty: Home Health Services Why: HHPT Contact information: [place] [address] [phone]        Tanda Rockers, MD. Schedule an appointment as soon as possible for a visit in 1 week(s).   Specialty: Pulmonary Disease Why: Stage IV lung cancer Contact information: [address] [place] [address] [phone]        Derek Jack, MD. Schedule an appointment as soon as possible for a visit in 1 week(s).   Specialty: Hematology Why: Stage IV lung cancer Contact information: [address] [place] [address] [phone]            Contact information for after-discharge care    Destination    [facility] .   Service: Assisted Living Contact information: [address] [place] [phone]                   Consults obtained -   Diet and Activity recommendation:  As advised  Discharge Instructions   Discharge Instructions    Call MD for:  difficulty breathing, headache or visual disturbances   Complete by: As directed    Call MD for:  persistant dizziness or light-headedness   Complete by: As directed    Call MD for:  persistant nausea and vomiting   Complete by: As directed    Call MD for:  temperature >100.4   Complete by: As directed    Diet - low sodium heart healthy   Complete by: As directed    Discharge instructions   Complete by: As directed    1)Follow - up with Dr. Kara Mead or Dr Christinia Gully Pulmonologist in [place]- due to Primary  lung cancer with metastasis/Enlarging left hilar mass with compression or infiltration of the Lt Bronchus/Bony Mets -Address: [address] [place], [place] [address] Phone: [phone] OR call [phone] -Patient needs to be seen in the [place] office, Not in [place]  2)Please follow-up with oncologist Dr. Derek Jack, MD--due to Primary lung cancer with metastasis/Enlarging left hilar mass with compression or infiltration of the Lt Bronchus/Bony Mets -Address: inside [facility] (4th  Floor), [address] [place], [place] [address] Phone: [phone]  3) please take medications as prescribed  4) it is important that you follow-up with pulmonologist as noted above #1  5)you need oxygen at home at 4 L via nasal cannula continuously while awake and while asleep--- smoking or having open fires around oxygen can cause fire, significant injury and death   Increase activity slowly   Complete by: As directed       Discharge Medications     Allergies as of 09/27/2020      Reactions   Prednisone Nausea Only   Chantix [varenicline] Other (See Comments)   Mental status changes   Codeine Itching      Medication List    STOP taking these medications   Melatonin 10 MG Subl Replaced by: Melatonin 10 MG Tabs     TAKE these medications   acetaminophen 325 MG tablet Commonly known as: TYLENOL Take 650 mg by mouth every 4 (four) hours as needed.   albuterol 108 (90 Base) MCG/ACT inhaler Commonly known as: VENTOLIN HFA Inhale 2 puffs into the lungs every 4 (four) hours as needed for wheezing or shortness of breath (cough). What changed:   how much to take  when to take this  reasons to take this   ALPRAZolam 0.5 MG tablet Commonly known as: XANAX Take 1 tablet by mouth 2 (two) times daily.   amLODipine 10 MG tablet Commonly known as: NORVASC Take 1 tablet (10 mg total) by mouth daily. For BP What changed:   medication strength  how much to  take  additional instructions   amoxicillin-clavulanate 875-125 MG tablet Commonly known as: Augmentin Take 1 tablet by mouth 2 (two) times daily for 5 days.   Anoro Ellipta 62.5-25 MCG/INH Aepb Generic drug: umeclidinium-vilanterol Inhale 1 puff into the lungs daily.   Artificial Tears 1 % ophthalmic solution Generic drug: carboxymethylcellulose Apply 1 drop to eye every 2 (two) hours as needed (dry eye). Both eyes What changed:   medication strength  when to take this  additional instructions   ascorbic acid 500 MG tablet Commonly known as: VITAMIN C Take 500 mg by mouth daily.   atorvastatin 40 MG tablet Commonly known as: LIPITOR Take 40 mg by mouth daily.   baclofen 10 MG tablet Commonly known as: LIORESAL Take 10 mg by mouth daily.   busPIRone 7.5 MG tablet Commonly known as: BUSPAR Take 1 tablet by mouth 2 (two) times a day.   cephALEXin 500 MG capsule Commonly known as: KEFLEX Take 1 capsule (500 mg total) by mouth every 8 (eight) hours for 3 days.   dexamethasone 6 MG tablet Commonly known as: DECADRON Take 1 tablet (6 mg total) by mouth daily.   ferrous sulfate 325 (65 FE) MG tablet Take 325 mg by mouth 2 (two) times daily with a meal.   fluticasone 50 MCG/ACT nasal spray Commonly known as: FLONASE Place 1 spray into both nostrils daily.   ipratropium-albuterol 0.5-2.5 (3) MG/3ML Soln Commonly known as: DUONEB Take 3 mLs by nebulization 3 (three) times daily. What changed:   when to take this  reasons to take this   loratadine 10 MG tablet Commonly known as: CLARITIN Take 10 mg by mouth daily.   Melatonin 10 MG Tabs Take 10 mg by mouth at bedtime. Replaces: Melatonin 10 MG Subl   Mucinex Maximum Strength 1200 MG Tb12 Generic drug: Guaifenesin Take 1 tablet (1,200 mg total) by mouth 2 (two) times daily as  needed (cough). What changed:   how much to take  when to take this  reasons to take this   ondansetron 4 MG  tablet Commonly known as: ZOFRAN Take 4 mg by mouth every 6 (six) hours as needed for nausea or vomiting.   sertraline 50 MG tablet Commonly known as: ZOLOFT Take 50 mg by mouth daily.   traMADol 50 MG tablet Commonly known as: ULTRAM Take 1 tablet (50 mg total) by mouth as needed for moderate pain.   triamcinolone cream 0.5 % Commonly known as: KENALOG Apply 1 application topically 2 (two) times daily as needed (skin).   Vitamin D (Cholecalciferol) 10 MCG (400 UNIT) Tabs Take 1 tablet by mouth daily.   zinc gluconate 50 MG tablet Take 50 mg by mouth daily.       Major procedures and Radiology Reports - PLEASE review detailed and final reports for all details, in brief -   CT Head Wo Contrast  Result Date: 09/22/2020 CLINICAL DATA:  Headache, new or worsening (Age >= 50y) EXAM: CT HEAD WITHOUT CONTRAST TECHNIQUE: Contiguous axial images were obtained from the base of the skull through the vertex without intravenous contrast. COMPARISON:  09/21/2016. FINDINGS: Brain: No acute infarct or intracranial hemorrhage. No mass lesion. No midline shift, ventriculomegaly or extra-axial fluid collection. Vascular: No hyperdense vessel or unexpected calcification. Bilateral skull base atherosclerotic calcifications. Skull: Negative for fracture or focal lesion. Sinuses/Orbits: Normal orbits. Clear paranasal sinuses. No mastoid effusion. Other: None. IMPRESSION: No acute intracranial process. Electronically Signed   By: Primitivo Gauze M.D.   On: 09/22/2020 12:04   CT CHEST WO CONTRAST  Result Date: 09/26/2020 CLINICAL DATA:  Non-small cell lung cancer.  Known lung nodule. EXAM: CT CHEST WITHOUT CONTRAST TECHNIQUE: Multidetector CT imaging of the chest was performed following the standard protocol without IV contrast. COMPARISON:  CT chest 08/16/2019.  PET-CT 07/07/2020 FINDINGS: Cardiovascular: Normal heart size. No pericardial effusions. Coronary artery calcifications. Normal caliber  thoracic aorta with scattered calcification. Mediastinum/Nodes: Esophagus is decompressed. Mediastinal lymph nodes are not pathologically enlarged. Lungs/Pleura: Left hilar mass is difficult to define without contrast material but appears to measure about 3.4 x 3.9 cm in diameter. Measurement is enlarging since the previous study. Emphysematous changes throughout the lungs. Right upper lung nodule seen on prior PET-CT scan is less distinct than previously. There is developing linear infiltration in the left lung base probably representing postobstructive change. The left lower lung bronchus appears to be compressed or infiltrated by the hilar mass. Nodule in the left lower lung posteriorly measures 4 mm diameter. No change since prior study. No pleural effusions. No pneumothorax. Upper Abdomen: Left adrenal gland nodule measuring 1.7 cm diameter. No change since prior study. Musculoskeletal: Compression deformities of the T5 and T6 vertebra with mild compression of C7. Progression of vertebral compression since previous study. These are likely metastatic but could also represent progressing osteoporosis. IMPRESSION: 1. Enlarging left hilar mass with compression or infiltration of the left lower lobe bronchus with associated postobstructive changes. 2. Emphysematous changes throughout the lungs. 3. Compression deformities of the T5 and T6 vertebra with mild compression of C7. Progression of vertebral compression since previous study. These are likely metastatic but could also represent progressing osteoporosis. 4. Left adrenal gland nodule measuring 1.7 cm diameter. No change since prior study. 5. Emphysema and aortic atherosclerosis. 6. Right upper lung nodule seen on prior PET-CT scan is less distinct than previously. Stable appearance of a 4 mm nodule in the left lung base.  Aortic Atherosclerosis (ICD10-I70.0) and Emphysema (ICD10-J43.9). Electronically Signed   By: Lucienne Capers M.D.   On: 09/26/2020 16:59    DG Chest Port 1 View  Result Date: 09/22/2020 CLINICAL DATA:  Shortness of breath, wheezing. EXAM: PORTABLE CHEST 1 VIEW COMPARISON:  September 07, 2020. FINDINGS: The heart size and mediastinal contours are within normal limits. Both lungs are clear. No pneumothorax or pleural effusion is noted. The visualized skeletal structures are unremarkable. IMPRESSION: No active disease. Electronically Signed   By: Marijo Conception M.D.   On: 09/22/2020 09:57   DG Chest Port 1 View  Result Date: 09/07/2020 CLINICAL DATA:  Cough and congestion. EXAM: PORTABLE CHEST 1 VIEW COMPARISON:  Chest x-ray 09/03/2019 FINDINGS: The cardiac silhouette, mediastinal and hilar contours are within normal limits and stable. Stable tortuosity and calcification of the thoracic aorta. Chronic emphysematous and bronchitic lung changes without definite acute overlying pulmonary process no pleural effusions or pneumothorax. The bony thorax is intact. IMPRESSION: Chronic lung changes without definite acute overlying pulmonary process. Electronically Signed   By: Marijo Sanes M.D.   On: 09/07/2020 12:09    Micro Results    Recent Results (from the past 240 hour(s))  Resp Panel by RT-PCR (Flu A&B, Covid) Nasopharyngeal Swab     Status: None   Collection Time: 09/22/20  9:41 AM   Specimen: Nasopharyngeal Swab; Nasopharyngeal(NP) swabs in vial transport medium  Result Value Ref Range Status   SARS Coronavirus 2 by RT PCR NEGATIVE NEGATIVE Final    Comment: (NOTE) SARS-CoV-2 target nucleic acids are NOT DETECTED.  The SARS-CoV-2 RNA is generally detectable in upper respiratory specimens during the acute phase of infection. The lowest concentration of SARS-CoV-2 viral copies this assay can detect is 138 copies/mL. A negative result does not preclude SARS-Cov-2 infection and should not be used as the sole basis for treatment or other patient management decisions. A negative result may occur with  improper specimen  collection/handling, submission of specimen other than nasopharyngeal swab, presence of viral mutation(s) within the areas targeted by this assay, and inadequate number of viral copies(<138 copies/mL). A negative result must be combined with clinical observations, patient history, and epidemiological information. The expected result is Negative.  Fact Sheet for Patients:  EntrepreneurPulse.com.au  Fact Sheet for Healthcare Providers:  IncredibleEmployment.be  This test is no t yet approved or cleared by the [place] FDA and  has been authorized for detection and/or diagnosis of SARS-CoV-2 by FDA under an Emergency Use Authorization (EUA). This EUA will remain  in effect (meaning this test can be used) for the duration of the COVID-19 declaration under Section 564(b)(1) of the Act, 21 U.S.C.section 360bbb-3(b)(1), unless the authorization is terminated  or revoked sooner.       Influenza A by PCR NEGATIVE NEGATIVE Final   Influenza B by PCR NEGATIVE NEGATIVE Final    Comment: (NOTE) The Xpert Xpress SARS-CoV-2/FLU/RSV plus assay is intended as an aid in the diagnosis of influenza from Nasopharyngeal swab specimens and should not be used as a sole basis for treatment. Nasal washings and aspirates are unacceptable for Xpert Xpress SARS-CoV-2/FLU/RSV testing.  Fact Sheet for Patients: EntrepreneurPulse.com.au  Fact Sheet for Healthcare Providers: IncredibleEmployment.be  This test is not yet approved or cleared by the [place] FDA and has been authorized for detection and/or diagnosis of SARS-CoV-2 by FDA under an Emergency Use Authorization (EUA). This EUA will remain in effect (meaning this test can be used) for the duration of the COVID-19 declaration  under Section 564(b)(1) of the Act, 21 U.S.C. section 360bbb-3(b)(1), unless the authorization is terminated or revoked.  Performed at [facility], [address], [place], [place] [address]     Today   Subjective    Chinmayi Rumer today has no new complaints  Discussed with pt's son [name] prior to dc to ALF -Patient is chronically ill, O2 requirement is back down to baseline of 4 L/min   Patient has been seen and examined prior to discharge   Objective   Blood pressure (!) 151/57, pulse 94, temperature 98.4 F (36.9 C), resp. rate 18, height 5' 5.5" (1.664 m), weight 65.3 kg, SpO2 90 %.   Intake/Output Summary (Last 24 hours) at 09/27/2020 1419 Last data filed at 09/26/2020 1827 Gross per 24 hour  Intake --  Output 600 ml  Net -600 ml    Exam Gen:- Awake Alert, no acute distress , no conversational dyspnea at rest HEENT:- [place].AT, No sclera icterus Nose-  4L/min Neck-Supple Neck,No JVD,.  Lungs-fair air movement, no significant wheezing at this time CV- S1, S2 normal, regular Abd-  +ve B.Sounds, Abd Soft, No tenderness,    Extremity/Skin:- No  edema,   good pulses Psych-affect is appropriate, oriented x3 Neuro-generalized weakness and deconditioning, no new focal deficits, no tremors    Data Review   CBC w Diff:  Lab Results  Component Value Date   WBC 14.9 (H) 09/27/2020   HGB 11.2 (L) 09/27/2020   HGB 10.9 01/21/2013   HCT 36.6 09/27/2020   HCT 33 01/21/2013   PLT 309 09/27/2020   PLT 305 01/14/2013   LYMPHOPCT 9 09/27/2020   MONOPCT 3 09/27/2020   EOSPCT 0 09/27/2020   BASOPCT 0 09/27/2020    CMP:  Lab Results  Component Value Date   NA 138 09/23/2020   NA 143 01/14/2013   K 4.1 09/23/2020   K 4.7 01/14/2013   CL 98 09/23/2020   CO2 27 09/23/2020   BUN 10 09/23/2020   BUN 10 01/14/2013   CREATININE 0.67 09/23/2020   CREATININE 0.74 01/14/2013   GLU 99 01/14/2013   PROT 6.6 09/22/2020   ALBUMIN 3.8 09/22/2020   ALBUMIN 4.5 01/14/2013   BILITOT 0.4 09/22/2020   BILITOT 0.3 01/14/2013   ALKPHOS 83 09/22/2020   ALKPHOS 87 01/14/2013   AST 15 09/22/2020   AST 12 01/14/2013   ALT  14 09/22/2020  .   Total Discharge time is about 33 minutes  Roxan Hockey M.D on 09/27/2020 at 2:19 PM  Go to [email or web address] -  for contact info  [facility] - Office  [phone]

## 2020-09-27 NOTE — TOC Progression Note (Signed)
Transition of Care [facility]) - Progression Note    Patient Details  Name: Lindsey Combs MRN: [id number] Date of Birth: 1946-01-04  Transition of Care [facility]) CM/SW Contact  Natasha Bence, LCSW Phone Number: 09/27/2020, 5:29 PM  Clinical Narrative:    CSW contacted [facility] for patient's return. [facility] reported that they would be able to take patient back on 09/27/2020. CSW received notification from MD and nurse that patient was refusing to return to [facility]. Per nurse patient became highly agitated and went into afib. Patient requested comfort care measures and hospice. TOC to follow.    Expected Discharge Plan: Assisted Living Barriers to Discharge: Continued Medical Work up  Expected Discharge Plan and Services Expected Discharge Plan: Assisted Living In-house Referral: Clinical Social Work Discharge Planning Services: NA Post Acute Care Choice: NA Living arrangements for the past 2 months: [place] Expected Discharge Date: 09/27/20               DME Arranged: N/A DME Agency: NA       HH Arranged: PT HH Agency: [facility] Date [place]: 09/24/20 Time [place]: 0254 Representative spoke with at [place]: Glennis Brink   Social Determinants of Health (SDOH) Interventions    Readmission Risk Interventions No flowsheet data found.

## 2020-09-27 NOTE — NC FL2 (Signed)
[place] LEVEL OF CARE SCREENING TOOL     IDENTIFICATION  Patient Name: Lindsey Combs Birthdate: 02-Jan-1946 Sex: female Admission Date (Current Location): 09/22/2020  [facility] and [place] Number:  [employer] and Address:  [place] [address], [place]      Provider Number: [id number]  Attending Physician Name and Address:  Roxan Hockey, MD  Relative Name and Phone Number:  Sorrel Cassetta (son) Ph: [phone]    Current Level of Care: Hospital Recommended Level of Care: [place] ([facility]) Prior Approval Number:    Date Approved/Denied:   PASRR Number:    Discharge Plan: Other (Comment) ([facility])    Current Diagnoses: Patient Active Problem List   Diagnosis Date Noted  . Lung cancer--- Primary lung cancer with metastasis/Enlarging left hilar mass with compression or infiltration of the Lt Bronchus/Bony Mets 09/27/2020  . 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 Chronic respiratory failure with hypoxia ([place]) 07/27/2020  . Mass of right lung 07/27/2020  . PAD (peripheral artery disease) ([place]) 08/13/2019  . H/O colonoscopy with polypectomy 04/25/2019  . Acute on chronic respiratory failure with hypoxia ([place]) 05/04/2018  . Severe protein-calorie malnutrition ([place]) 05/04/2018  . Closed right hip fracture ([place]) greater Trochanter 07/13/17 07/13/2017  . Vomiting and diarrhea 07/13/2017  . Fall at home, initial encounter 07/13/2017  . Hip fracture ([place]) 07/13/2017  . COPD/Emphysema with acute exacerbation ([place]) 02/16/2017  . Tobacco abuse 02/16/2017  . Malnutrition of moderate degree 09/22/2016  . Hypomagnesemia 09/21/2016  . Tobacco use 08/03/2016  . Hyperlipidemia 08/03/2016  . Hypertension 08/03/2016  . History of Helicobacter pylori infection 08/03/2016  . History of anemia 08/03/2016  . Diverticulosis 08/03/2016  . Arthritis 08/03/2016   . Carcinoma of uvula ([place]) 07/13/2016  . Chronic obstructive pulmonary disease with acute exacerbation ([place])   . Acute respiratory failure with hypoxia ([place]) 04/03/2015  . COPD exacerbation ([place]) 04/03/2015  . Hyponatremia 04/03/2015  . Dehydration 04/03/2015  . Hyperglycemia 04/03/2015  . Anxiety   . COPD GOLD ? / 02 dep    . Peripheral neuropathy ([place])   . Tobacco dependence   . Back pain, chronic 04/19/2013  . Abnormal weight loss 02/19/2013  . Anemia, iron deficiency 02/19/2013  . GERD (gastroesophageal reflux disease) 02/19/2013    Orientation RESPIRATION BLADDER Height & Weight     Self,Time,Situation,Place  O2 (4L/min) Incontinent Weight: 144 lb (65.3 kg) Height:  5' 5.5" (166.4 cm)  BEHAVIORAL SYMPTOMS/MOOD NEUROLOGICAL BOWEL NUTRITION STATUS      Continent Diet (See discharge summary)  AMBULATORY STATUS COMMUNICATION OF NEEDS Skin   Limited Assist Verbally Normal                       Personal Care Assistance Level of Assistance  Bathing,Feeding,Dressing Bathing Assistance: Limited assistance Feeding assistance: Independent Dressing Assistance: Limited assistance     Functional Limitations Info  Sight,Hearing,Speech Sight Info: Adequate Hearing Info: Adequate Speech Info: Adequate    SPECIAL CARE FACTORS FREQUENCY  PT (By licensed PT)     PT Frequency: 3x's/week              Contractures Contractures Info: Not present    Additional Factors Info  Code Status,Allergies,Psychotropic Code Status Info: DNR Allergies Info: Prednisone; Chantix (Varenicline); Codeine Psychotropic Info: Zoloft (sertraline); BuSpar (buspirone); Xanax (alprazolam)         Current Medications (09/27/2020):  This is  the current hospital active medication list Current Facility-Administered Medications  Medication Dose Route Frequency Provider Last Rate Last Admin  . 0.9 %  sodium chloride infusion  250 mL Intravenous PRN Manuella Ghazi, Pratik D, DO      . acetaminophen  (TYLENOL) tablet 650 mg  650 mg Oral Q4H PRN Barb Merino, MD   650 mg at 09/27/20 1318  . albuterol (PROVENTIL) (2.5 MG/3ML) 0.083% nebulizer solution 2.5 mg  2.5 mg Nebulization Q2H PRN Barb Merino, MD   2.5 mg at 09/26/20 1023  . ALPRAZolam Duanne Moron) tablet 0.5 mg  0.5 mg Oral BID Manuella Ghazi, Pratik D, DO   0.5 mg at 09/27/20 7619  . amLODipine (NORVASC) tablet 5 mg  5 mg Oral Daily Manuella Ghazi, Pratik D, DO   5 mg at 09/27/20 0904  . ascorbic acid (VITAMIN C) tablet 500 mg  500 mg Oral Daily Manuella Ghazi, Pratik D, DO   500 mg at 09/27/20 5093  . atorvastatin (LIPITOR) tablet 40 mg  40 mg Oral Daily Manuella Ghazi, Pratik D, DO   40 mg at 09/27/20 0904  . baclofen (LIORESAL) tablet 10 mg  10 mg Oral Daily Manuella Ghazi, Pratik D, DO   10 mg at 09/27/20 2671  . budesonide (PULMICORT) nebulizer solution 0.25 mg  0.25 mg Nebulization BID Manuella Ghazi, Pratik D, DO   0.25 mg at 09/27/20 0735  . busPIRone (BUSPAR) tablet 7.5 mg  7.5 mg Oral BID Manuella Ghazi, Pratik D, DO   7.5 mg at 09/27/20 2458  . cephALEXin (KEFLEX) capsule 500 mg  500 mg Oral Q8H Barb Merino, MD   500 mg at 09/27/20 1318  . cholecalciferol (VITAMIN D3) tablet 1,000 Units  1,000 Units Oral Daily Heath Lark D, DO   1,000 Units at 09/27/20 0998  . enoxaparin (LOVENOX) injection 40 mg  40 mg Subcutaneous Q24H Manuella Ghazi, Pratik D, DO   40 mg at 09/26/20 1650  . ferrous sulfate tablet 325 mg  325 mg Oral BID WC Manuella Ghazi, Pratik D, DO   325 mg at 09/27/20 3382  . fluticasone (FLONASE) 50 MCG/ACT nasal spray 1 spray  1 spray Each Nare Daily Manuella Ghazi, Pratik D, DO   1 spray at 09/27/20 0908  . guaiFENesin (MUCINEX) 12 hr tablet 600 mg  600 mg Oral BID Manuella Ghazi, Pratik D, DO   600 mg at 09/27/20 5053  . hydrALAZINE (APRESOLINE) injection 10 mg  10 mg Intravenous Q4H PRN Manuella Ghazi, Pratik D, DO      . ipratropium-albuterol (DUONEB) 0.5-2.5 (3) MG/3ML nebulizer solution 3 mL  3 mL Nebulization Q6H Shah, Pratik D, DO   3 mL at 09/27/20 0735  . loratadine (CLARITIN) tablet 10 mg  10 mg Oral Daily Manuella Ghazi, Pratik D,  DO   10 mg at 09/27/20 0904  . melatonin tablet 3 mg  3 mg Oral QHS Shah, Pratik D, DO   3 mg at 09/26/20 2245  . methylPREDNISolone sodium succinate (SOLU-MEDROL) 40 mg/mL injection 40 mg  40 mg Intravenous Q12H Shah, Pratik D, DO   40 mg at 09/27/20 0430  . ondansetron (ZOFRAN) tablet 4 mg  4 mg Oral Q6H PRN Manuella Ghazi, Pratik D, DO       Or  . ondansetron (ZOFRAN) injection 4 mg  4 mg Intravenous Q6H PRN Manuella Ghazi, Pratik D, DO   4 mg at 09/25/20 9767  . phenol (CHLORASEPTIC) mouth spray 1 spray  1 spray Mouth/Throat PRN Manuella Ghazi, Pratik D, DO      . polyvinyl alcohol (LIQUIFILM TEARS) 1.4 % ophthalmic solution  1 drop  1 drop Both Eyes PRN Manuella Ghazi, Pratik D, DO      . sertraline (ZOLOFT) tablet 50 mg  50 mg Oral Daily Manuella Ghazi, Pratik D, DO   50 mg at 09/27/20 0904  . sodium chloride flush (NS) 0.9 % injection 3 mL  3 mL Intravenous Q12H Shah, Pratik D, DO   3 mL at 09/27/20 0908  . sodium chloride flush (NS) 0.9 % injection 3 mL  3 mL Intravenous PRN Manuella Ghazi, Pratik D, DO      . traMADol Veatrice Bourbon) tablet 50 mg  50 mg Oral Q8H PRN Manuella Ghazi, Pratik D, DO   50 mg at 09/26/20 2251  . zinc sulfate capsule 220 mg  220 mg Oral Daily Manuella Ghazi, Pratik D, DO   220 mg at 09/27/20 0904     Discharge Medications: Medication List    STOP taking these medications   Melatonin 10 MG Subl Replaced by: Melatonin 10 MG Tabs     TAKE these medications   acetaminophen 325 MG tablet Commonly known as: TYLENOL Take 650 mg by mouth every 4 (four) hours as needed.   albuterol 108 (90 Base) MCG/ACT inhaler Commonly known as: VENTOLIN HFA Inhale 2 puffs into the lungs every 4 (four) hours as needed for wheezing or shortness of breath (cough). What changed:   how much to take  when to take this  reasons to take this   ALPRAZolam 0.5 MG tablet Commonly known as: XANAX Take 1 tablet by mouth 2 (two) times daily.   amLODipine 10 MG tablet Commonly known as: NORVASC Take 1 tablet (10 mg total) by mouth daily. For BP What  changed:   medication strength  how much to take  additional instructions   amoxicillin-clavulanate 875-125 MG tablet Commonly known as: Augmentin Take 1 tablet by mouth 2 (two) times daily for 5 days.   Anoro Ellipta 62.5-25 MCG/INH Aepb Generic drug: umeclidinium-vilanterol Inhale 1 puff into the lungs daily.   Artificial Tears 1 % ophthalmic solution Generic drug: carboxymethylcellulose Apply 1 drop to eye every 2 (two) hours as needed (dry eye). Both eyes What changed:   medication strength  when to take this  additional instructions   ascorbic acid 500 MG tablet Commonly known as: VITAMIN C Take 500 mg by mouth daily.   atorvastatin 40 MG tablet Commonly known as: LIPITOR Take 40 mg by mouth daily.   baclofen 10 MG tablet Commonly known as: LIORESAL Take 10 mg by mouth daily.   busPIRone 7.5 MG tablet Commonly known as: BUSPAR Take 1 tablet by mouth 2 (two) times a day.   cephALEXin 500 MG capsule Commonly known as: KEFLEX Take 1 capsule (500 mg total) by mouth every 8 (eight) hours for 3 days.   dexamethasone 6 MG tablet Commonly known as: DECADRON Take 1 tablet (6 mg total) by mouth daily.   ferrous sulfate 325 (65 FE) MG tablet Take 325 mg by mouth 2 (two) times daily with a meal.   fluticasone 50 MCG/ACT nasal spray Commonly known as: FLONASE Place 1 spray into both nostrils daily.   ipratropium-albuterol 0.5-2.5 (3) MG/3ML Soln Commonly known as: DUONEB Take 3 mLs by nebulization 3 (three) times daily. What changed:   when to take this  reasons to take this   loratadine 10 MG tablet Commonly known as: CLARITIN Take 10 mg by mouth daily.   Melatonin 10 MG Tabs Take 10 mg by mouth at bedtime. Replaces: Melatonin 10 MG Subl   Mucinex Maximum  Strength 1200 MG Tb12 Generic drug: Guaifenesin Take 1 tablet (1,200 mg total) by mouth 2 (two) times daily as needed (cough). What changed:   how much to take  when to take  this  reasons to take this   ondansetron 4 MG tablet Commonly known as: ZOFRAN Take 4 mg by mouth every 6 (six) hours as needed for nausea or vomiting.   sertraline 50 MG tablet Commonly known as: ZOLOFT Take 50 mg by mouth daily.   traMADol 50 MG tablet Commonly known as: ULTRAM Take 1 tablet (50 mg total) by mouth as needed for moderate pain.   triamcinolone cream 0.5 % Commonly known as: KENALOG Apply 1 application topically 2 (two) times daily as needed (skin).   Vitamin D (Cholecalciferol) 10 MCG (400 UNIT) Tabs Take 1 tablet by mouth daily.   zinc gluconate 50 MG tablet Take 50 mg by mouth daily.     Relevant Imaging Results:  Relevant Lab Results:   Additional Information SSN: [id number]  Natasha Bence, LCSW

## 2020-09-28 DIAGNOSIS — Z515 Encounter for palliative care: Secondary | ICD-10-CM | POA: Diagnosis not present

## 2020-09-28 LAB — RESP PANEL BY RT-PCR (FLU A&B, COVID) ARPGX2
Influenza A by PCR: NEGATIVE
Influenza B by PCR: NEGATIVE
SARS Coronavirus 2 by RT PCR: NEGATIVE

## 2020-09-28 NOTE — Progress Notes (Signed)
Report called to RN on Dept. 300. Patient transported via bed.

## 2020-09-28 NOTE — Discharge Instructions (Signed)
--  Transfer to residential hospice with full comfort care

## 2020-09-28 NOTE — TOC Progression Note (Signed)
Transition of Care [facility]) - Progression Note    Patient Details  Name: Lindsey Combs MRN: [id number] Date of Birth: 02/12/1946  Transition of Care [facility]) CM/SW Contact  Boneta Lucks, RN Phone Number: 09/28/2020, 12:31 PM  Clinical Narrative:  TOC spoke with [name] and Tommi Rumps - sons. They both are agreeing to [facility]. Referral sent to [facility]. MD and RN updated. TOC to follow.    Expected Discharge Plan: Hospice Medical Facility Barriers to Discharge: Other (comment) (waiting on Hospice bed offer)  Expected Discharge Plan and Services Expected Discharge Plan: [place] In-house Referral: Clinical Social Work Discharge Planning Services: NA Post Acute Care Choice: NA Living arrangements for the past 2 months: [place] Expected Discharge Date: 09/27/20               DME Arranged: N/A DME Agency: NA      HH Arranged: PT HH Agency: [facility] Date [place]: 09/24/20 Time [place]: 5110 Representative spoke with at [place]: Glennis Brink   Readmission Risk Interventions Readmission Risk Prevention Plan 09/28/2020  Transportation Screening Complete  PCP or Specialist Appt within 5-7 Days Complete  Home Care Screening Complete  Medication Review (RN CM) Complete  Some recent data might be hidden

## 2020-09-28 NOTE — Discharge Summary (Signed)
Lindsey Combs, is a 75 y.o. female  DOB Oct 21, 1945  MRN [id number].  Admission date:  09/22/2020  Admitting Physician  Boyd, DO  Discharge Date:  09/28/2020   Primary MD  [name], MD  Recommendations for primary care physician for things to follow:   -Transfer to residential hospice with full comfort care  Admission Diagnosis  COPD exacerbation ([place]) [J44.1] COPD with acute exacerbation ([place]) [J44.1] Chronic intractable headache, unspecified headache type [R51.9, G89.29]   Discharge Diagnosis  COPD exacerbation ([place]) [J44.1] COPD with acute exacerbation ([place]) [J44.1] Chronic intractable headache, unspecified headache type [R51.9, G89.29]    Principal Problem:   End of life care Active Problems:   COPD/Emphysema with acute exacerbation ([place])   Lung cancer--- Primary lung cancer with metastasis/Enlarging left hilar mass with compression or infiltration of the Lt Bronchus/Bony Mets   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 Acute respiratory failure with hypoxia ([place])   . Anemia   . Anxiety   . Cancer (HCC)    uvular per pt  . Cervicalgia   . COPD (chronic obstructive pulmonary disease) ([place])   . DDD (degenerative disc disease), lumbar   . Degenerative joint disease of spine   . Diabetes mellitus without complication ([place])   . Diverticulosis   . GERD (gastroesophageal reflux disease)   . H. pylori infection    treated 02/2013.  [name] History of UTI   . Hypercholesterolemia   . Hypertension   . Hyponatremia   . MDD (major depressive disorder)   . Peripheral neuropathy   . Shortness of breath    with exertion  . Spondylosis   . Thyroid disease    . Tobacco abuse     Past Surgical History:  Procedure Laterality Date  . ABDOMINAL EXPLORATION SURGERY     age 40, large ovarian cyst  . BIOPSY N/A 03/01/2013   KVQ:QVZD hiatal hernia; otherwise, normal examination/ Status post biopsies as described above. SB bx negative for Celiac. +h/pylori  . CATARACT EXTRACTION W/ INTRAOCULAR LENS  IMPLANT, BILATERAL    . COLONOSCOPY  10/16/09   Jenkins:3 polypsin the sigmoid colon/polyps in the descending colon and the rectum/scattered diverticulum. hyperplastic  . COLONOSCOPY WITH ESOPHAGOGASTRODUODENOSCOPY (EGD) N/A 03/01/2013   GLO:VFIEPPIR colonic polyps - treated/removed as described above. Colonic diverticulosis. Hyperplastic. Next TCS 02/2018.  Gastric biopsy showed H. pylori.  She completed treatment.  [name] DIRECT LARYNGOSCOPY N/A 06/08/2016   Procedure: DIRECT LARYNGOSCOPY AND BIOPSY;  Surgeon: Leta Baptist, MD;  Location: [facility] OR;  Service: ENT;  Laterality: N/A;  . PARTIAL HYSTERECTOMY     age 38  . UVULECTOMY       HPI  from the history and physical done on the day of admission:    Patient  coming from: [facility]  Chief Complaint: Dyspnea  HPI: Lindsey Combs is a 75 y.o. female with medical history significant for COPD with chronic hypoxemia on 4 L nasal cannula oxygen, anemia of inflammation, distant history of uvular cancer, pulmonary nodule with suspected lung cancer, hypertension, dyslipidemia, GERD, and anxiety who presented to the ED with worsening shortness of breath and wheezing over the past several days.  She has been using her home inhaler medications without significant relief.  She also reports feeling somewhat febrile.  She also is noted to have a cough that is sometimes productive of whitish sputum and has had Covid twice, but does get checked weekly and her last negative test was 5 days ago.  She denies any chest pain.  She is also noticing a left-sided headache for the past month or so with persistent nausea, but no emesis and has  had an approximately 2 pound weight loss.    ED Course: Vital signs stable and patient is afebrile.  Hemoglobin 10.8 and mild leukocytosis of 12,100 noted.  CT of the head with no acute findings and chest x-ray with no acute findings.  Covid testing negative.  EKG with sinus rhythm at 96 bpm.  Patient has received some steroids and nebulized breathing treatments in the ED.  Review of Systems: Reviewed as noted above, otherwise negative.     Hospital Course:      A/p 1)End of Life Care--- Plan of care, advanced directives and goals of care discussed with patient and patient's son and daughter-in-law -----they have declined further interventions -They are requesting transition to full comfort care/hospice - Patient desires/wishes verified with patient and son -Patient is DNR/DNI -Initiated comfort cares palliative care/ hospice pathway order sets - -Transfer to residential hospice with full comfort care -Please see detailed progress note dated 09/27/2020 for details of patient's hospital stay  Discharge Condition: Prognosis is grave  Follow UP   Contact information for follow-up providers    [facility] Follow up.   Specialty: Home Health Services Why: HHPT Contact information: [place] [address] [phone]        Tanda Rockers, MD. Schedule an appointment as soon as possible for a visit in 1 week(s).   Specialty: Pulmonary Disease Why: Stage IV lung cancer Contact information: [address] [place] [address] [phone]        Derek Jack, MD. Schedule an appointment as soon as possible for a visit in 1 week(s).   Specialty: Hematology Why: Stage IV lung cancer Contact information: [address] [place] [address] [phone]            Contact information for after-discharge care    [facility] .   Service: Inpatient Hospice Contact  information: [address] [place] [phone]                   Consults obtained -hospice  Diet and Activity recommendation:  As advised  Discharge Instructions  * Discharge Instructions    Bed rest   Complete by: As directed    Diet general   Complete by: As directed    Discharge instructions   Complete by: As directed    Transfer to residential hospice with full comfort care        Discharge Medications     Allergies as of 09/28/2020      Reactions   Prednisone  Nausea Only   Chantix [varenicline] Other (See Comments)   Mental status changes   Codeine Itching      Medication List    STOP taking these medications   ALPRAZolam 0.5 MG tablet Commonly known as: XANAX   amLODipine 5 MG tablet Commonly known as: NORVASC   ascorbic acid 500 MG tablet Commonly known as: VITAMIN C   atorvastatin 40 MG tablet Commonly known as: LIPITOR   baclofen 10 MG tablet Commonly known as: LIORESAL   busPIRone 7.5 MG tablet Commonly known as: BUSPAR   ferrous sulfate 325 (65 FE) MG tablet   fluticasone 50 MCG/ACT nasal spray Commonly known as: FLONASE   ipratropium-albuterol 0.5-2.5 (3) MG/3ML Soln Commonly known as: DUONEB   loratadine 10 MG tablet Commonly known as: CLARITIN   Melatonin 10 MG Subl   Mucinex Maximum Strength 1200 MG Tb12 Generic drug: Guaifenesin   ondansetron 4 MG tablet Commonly known as: ZOFRAN   sertraline 50 MG tablet Commonly known as: ZOLOFT   traMADol 50 MG tablet Commonly known as: ULTRAM   triamcinolone cream 0.5 % Commonly known as: KENALOG   Vitamin D (Cholecalciferol) 10 MCG (400 UNIT) Tabs   zinc gluconate 50 MG tablet     TAKE these medications   acetaminophen 325 MG tablet Commonly known as: TYLENOL Take 650 mg by mouth every 4 (four) hours as needed.   albuterol 108 (90 Base) MCG/ACT inhaler Commonly known as: VENTOLIN HFA Inhale 2 puffs into the lungs every 4 (four) hours as  needed for wheezing or shortness of breath (cough). What changed:   how much to take  when to take this  reasons to take this   Anoro Ellipta 62.5-25 MCG/INH Aepb Generic drug: umeclidinium-vilanterol Inhale 1 puff into the lungs daily.   Artificial Tears 1 % ophthalmic solution Generic drug: carboxymethylcellulose Apply 1 drop to eye every 2 (two) hours as needed (dry eye). Both eyes What changed:   medication strength  when to take this  additional instructions       Major procedures and Radiology Reports - PLEASE review detailed and final reports for all details, in brief -   CT Head Wo Contrast  Result Date: 09/22/2020 CLINICAL DATA:  Headache, new or worsening (Age >= 50y) EXAM: CT HEAD WITHOUT CONTRAST TECHNIQUE: Contiguous axial images were obtained from the base of the skull through the vertex without intravenous contrast. COMPARISON:  09/21/2016. FINDINGS: Brain: No acute infarct or intracranial hemorrhage. No mass lesion. No midline shift, ventriculomegaly or extra-axial fluid collection. Vascular: No hyperdense vessel or unexpected calcification. Bilateral skull base atherosclerotic calcifications. Skull: Negative for fracture or focal lesion. Sinuses/Orbits: Normal orbits. Clear paranasal sinuses. No mastoid effusion. Other: None. IMPRESSION: No acute intracranial process. Electronically Signed   By: Primitivo Gauze M.D.   On: 09/22/2020 12:04   CT CHEST WO CONTRAST  Result Date: 09/26/2020 CLINICAL DATA:  Non-small cell lung cancer.  Known lung nodule. EXAM: CT CHEST WITHOUT CONTRAST TECHNIQUE: Multidetector CT imaging of the chest was performed following the standard protocol without IV contrast. COMPARISON:  CT chest 08/16/2019.  PET-CT 07/07/2020 FINDINGS: Cardiovascular: Normal heart size. No pericardial effusions. Coronary artery calcifications. Normal caliber thoracic aorta with scattered calcification. Mediastinum/Nodes: Esophagus is decompressed.  Mediastinal lymph nodes are not pathologically enlarged. Lungs/Pleura: Left hilar mass is difficult to define without contrast material but appears to measure about 3.4 x 3.9 cm in diameter. Measurement is enlarging since the previous study. Emphysematous changes throughout the lungs. Right upper lung  nodule seen on prior PET-CT scan is less distinct than previously. There is developing linear infiltration in the left lung base probably representing postobstructive change. The left lower lung bronchus appears to be compressed or infiltrated by the hilar mass. Nodule in the left lower lung posteriorly measures 4 mm diameter. No change since prior study. No pleural effusions. No pneumothorax. Upper Abdomen: Left adrenal gland nodule measuring 1.7 cm diameter. No change since prior study. Musculoskeletal: Compression deformities of the T5 and T6 vertebra with mild compression of C7. Progression of vertebral compression since previous study. These are likely metastatic but could also represent progressing osteoporosis. IMPRESSION: 1. Enlarging left hilar mass with compression or infiltration of the left lower lobe bronchus with associated postobstructive changes. 2. Emphysematous changes throughout the lungs. 3. Compression deformities of the T5 and T6 vertebra with mild compression of C7. Progression of vertebral compression since previous study. These are likely metastatic but could also represent progressing osteoporosis. 4. Left adrenal gland nodule measuring 1.7 cm diameter. No change since prior study. 5. Emphysema and aortic atherosclerosis. 6. Right upper lung nodule seen on prior PET-CT scan is less distinct than previously. Stable appearance of a 4 mm nodule in the left lung base. Aortic Atherosclerosis (ICD10-I70.0) and Emphysema (ICD10-J43.9). Electronically Signed   By: Lucienne Capers M.D.   On: 09/26/2020 16:59   DG CHEST PORT 1 VIEW  Result Date: 09/27/2020 CLINICAL DATA:  Worsening shortness of  breath. History of non-small cell lung cancer. EXAM: PORTABLE CHEST 1 VIEW COMPARISON:  Chest x-ray 09/22/2020 and chest CT 09/26/2020 FINDINGS: The cardiac silhouette, mediastinal and hilar contours are stable. There is a persistent large left hilar mass. Interval worsening left lung aeration likely due to lower lobe bronchial obstruction noted on the recent CT scan. The right lung remains clear. Stable underlying emphysematous changes. IMPRESSION: Left hilar mass. Worsening left lung aeration likely due to lower lobe bronchial obstruction noted on the recent CT scan. Electronically Signed   By: Marijo Sanes M.D.   On: 09/27/2020 15:22   DG Chest Port 1 View  Result Date: 09/22/2020 CLINICAL DATA:  Shortness of breath, wheezing. EXAM: PORTABLE CHEST 1 VIEW COMPARISON:  September 07, 2020. FINDINGS: The heart size and mediastinal contours are within normal limits. Both lungs are clear. No pneumothorax or pleural effusion is noted. The visualized skeletal structures are unremarkable. IMPRESSION: No active disease. Electronically Signed   By: Marijo Conception M.D.   On: 09/22/2020 09:57   DG Chest Port 1 View  Result Date: 09/07/2020 CLINICAL DATA:  Cough and congestion. EXAM: PORTABLE CHEST 1 VIEW COMPARISON:  Chest x-ray 09/03/2019 FINDINGS: The cardiac silhouette, mediastinal and hilar contours are within normal limits and stable. Stable tortuosity and calcification of the thoracic aorta. Chronic emphysematous and bronchitic lung changes without definite acute overlying pulmonary process no pleural effusions or pneumothorax. The bony thorax is intact. IMPRESSION: Chronic lung changes without definite acute overlying pulmonary process. Electronically Signed   By: Marijo Sanes M.D.   On: 09/07/2020 12:09    Micro Results   Recent Results (from the past 240 hour(s))  Resp Panel by RT-PCR (Flu A&B, Covid) Nasopharyngeal Swab     Status: None   Collection Time: 09/22/20  9:41 AM   Specimen:  Nasopharyngeal Swab; Nasopharyngeal(NP) swabs in vial transport medium  Result Value Ref Range Status   SARS Coronavirus 2 by RT PCR NEGATIVE NEGATIVE Final    Comment: (NOTE) SARS-CoV-2 target nucleic acids are NOT DETECTED.  The  SARS-CoV-2 RNA is generally detectable in upper respiratory specimens during the acute phase of infection. The lowest concentration of SARS-CoV-2 viral copies this assay can detect is 138 copies/mL. A negative result does not preclude SARS-Cov-2 infection and should not be used as the sole basis for treatment or other patient management decisions. A negative result may occur with  improper specimen collection/handling, submission of specimen other than nasopharyngeal swab, presence of viral mutation(s) within the areas targeted by this assay, and inadequate number of viral copies(<138 copies/mL). A negative result must be combined with clinical observations, patient history, and epidemiological information. The expected result is Negative.  Fact Sheet for Patients:  EntrepreneurPulse.com.au  Fact Sheet for Healthcare Providers:  IncredibleEmployment.be  This test is no t yet approved or cleared by the [place] FDA and  has been authorized for detection and/or diagnosis of SARS-CoV-2 by FDA under an Emergency Use Authorization (EUA). This EUA will remain  in effect (meaning this test can be used) for the duration of the COVID-19 declaration under Section 564(b)(1) of the Act, 21 U.S.C.section 360bbb-3(b)(1), unless the authorization is terminated  or revoked sooner.       Influenza A by PCR NEGATIVE NEGATIVE Final   Influenza B by PCR NEGATIVE NEGATIVE Final    Comment: (NOTE) The Xpert Xpress SARS-CoV-2/FLU/RSV plus assay is intended as an aid in the diagnosis of influenza from Nasopharyngeal swab specimens and should not be used as a sole basis for treatment. Nasal washings and aspirates are unacceptable for  Xpert Xpress SARS-CoV-2/FLU/RSV testing.  Fact Sheet for Patients: EntrepreneurPulse.com.au  Fact Sheet for Healthcare Providers: IncredibleEmployment.be  This test is not yet approved or cleared by the [place] FDA and has been authorized for detection and/or diagnosis of SARS-CoV-2 by FDA under an Emergency Use Authorization (EUA). This EUA will remain in effect (meaning this test can be used) for the duration of the COVID-19 declaration under Section 564(b)(1) of the Act, 21 U.S.C. section 360bbb-3(b)(1), unless the authorization is terminated or revoked.  Performed at [facility], [address] [place], [place] [address]     Today   Subjective    Lindsey Combs today has no new complaints        -Resting comfortably, requiring frequent IV morphine -Family at bedside   Patient has been seen and examined prior to discharge   Objective   Blood pressure 124/69, pulse 85, temperature (!) 97.3 F (36.3 C), temperature source Axillary, resp. rate 17, height 5' 5.5" (1.664 m), weight 66 kg, SpO2 (!) 86 %.   Intake/Output Summary (Last 24 hours) at 09/28/2020 1635 Last data filed at 09/28/2020 1200 Gross per 24 hour  Intake --  Output 300 ml  Net -300 ml    Exam Gen:- Awake Alert, no acute distress  HEENT:- [place].AT, No sclera icterus Nose- HFNC Neck-Supple Neck,No JVD,.  Lungs-diminished breath sounds on the left, no significant wheezing CV- S1, S2 normal, regular Abd-  +ve B.Sounds, Abd Soft, No tenderness,    Extremity/Skin:- No  edema,   good pulses Psych-affect is appropriate, oriented x3 Neuro-generalized weakness , no new focal deficits, no tremors    Data Review   CBC w Diff:  Lab Results  Component Value Date   WBC 14.9 (H) 09/27/2020   HGB 11.2 (L) 09/27/2020   HGB 10.9 01/21/2013   HCT 36.6 09/27/2020   HCT 33 01/21/2013   PLT 309 09/27/2020   PLT 305 01/14/2013   LYMPHOPCT 9 09/27/2020   MONOPCT 3  09/27/2020   EOSPCT 0 09/27/2020   BASOPCT 0 09/27/2020    CMP:  Lab Results  Component Value Date   NA 138 09/23/2020   NA 143 01/14/2013   K 4.1 09/23/2020   K 4.7 01/14/2013   CL 98 09/23/2020   CO2 27 09/23/2020   BUN 10 09/23/2020   BUN 10 01/14/2013   CREATININE 0.67 09/23/2020   CREATININE 0.74 01/14/2013   GLU 99 01/14/2013   PROT 6.6 09/22/2020   ALBUMIN 3.8 09/22/2020   ALBUMIN 4.5 01/14/2013   BILITOT 0.4 09/22/2020   BILITOT 0.3 01/14/2013   ALKPHOS 83 09/22/2020   ALKPHOS 87 01/14/2013   AST 15 09/22/2020   AST 12 01/14/2013   ALT 14 09/22/2020  .   Total Discharge time is about 33 minutes  Roxan Hockey M.D on 09/28/2020 at 4:35 PM  Go to [email or web address] -  for contact info  [facility] - Office  [phone]

## 2020-09-28 NOTE — Care Management Important Message (Signed)
Important Message  Patient Details  Name: Lindsey Combs MRN: [id number] Date of Birth: 22-Aug-1946   Medicare Important Message Given:  Yes     Tommy Medal 09/28/2020, 4:20 PM

## 2020-09-28 NOTE — Progress Notes (Addendum)
Report called to [facility] of [place]. Ems called to transport patient to [facility] [place] for EMS to transport patient. Discharge paper work, H&P note, DNR form placed in packet for receiving facility.

## 2020-09-28 NOTE — Progress Notes (Signed)
IVs removed. EMS arrived to transport patient. Patient stable upon discharge to hospice of [facility]).

## 2020-09-28 NOTE — TOC Transition Note (Signed)
Transition of Care [facility]) - CM/SW Discharge Note   Patient Details  Name: Lindsey Combs MRN: [id number] Date of Birth: [birth year]  Transition of Care [facility]) CM/SW Contact:  Boneta Lucks, RN Phone Number: 09/28/2020, 4:51 PM   Clinical Narrative:   [name] has completed Consign, TOC faxed DC summary, Medical necessity printed, RN to call report [phone] and EMS when COVID test is back.    Final next level of care: [place] Barriers to Discharge: Barriers Resolved   Patient Goals and CMS Choice Patient states their goals for this hospitalization and ongoing recovery are:: to go to residental hospice. CMS Medicare.gov Compare Post Acute Care list provided to:: Patient Represenative (must comment) Choice offered to / list presented to : Adult Children [name])  Discharge Placement              Patient chooses bed at: Other - please specify in the comment section below: [facility])     Patient and family notified of of transfer: 09/28/20  Discharge Plan and Services In-house Referral: Clinical Social Work Discharge Planning Services: NA Post Acute Care Choice: NA          DME Arranged: N/A DME Agency: NA       HH Arranged: PT HH Agency: [facility] Date [place]: 09/24/20 Time [place]: 3536 Representative spoke with at [facility]: Glennis Brink   Readmission Risk Interventions Readmission Risk Prevention Plan 09/28/2020  Transportation Screening Complete  PCP or Specialist Appt within 5-7 Days Complete  Home Care Screening Complete  Medication Review (RN CM) Complete  Some recent data might be hidden

## 2020-10-06 ENCOUNTER — Inpatient Hospital Stay (HOSPITAL_COMMUNITY): Payer: Medicare Other | Attending: Hematology

## 2020-10-14 ENCOUNTER — Ambulatory Visit (HOSPITAL_COMMUNITY): Payer: Medicare Other | Admitting: Hematology

## 2020-10-27 DEATH — deceased
